# Patient Record
Sex: Female | Born: 1945 | Race: White | Hispanic: No | Marital: Married | Smoking: Never smoker
Health system: Southern US, Community
[De-identification: ages and names within clinical notes are randomized; demographics above are authoritative.]

## PROBLEM LIST (undated history)

## (undated) DIAGNOSIS — T8859XA Other complications of anesthesia, initial encounter: Secondary | ICD-10-CM

## (undated) DIAGNOSIS — R112 Nausea with vomiting, unspecified: Secondary | ICD-10-CM

## (undated) DIAGNOSIS — C801 Malignant (primary) neoplasm, unspecified: Secondary | ICD-10-CM

## (undated) DIAGNOSIS — J45909 Unspecified asthma, uncomplicated: Secondary | ICD-10-CM

## (undated) DIAGNOSIS — N809 Endometriosis, unspecified: Secondary | ICD-10-CM

## (undated) DIAGNOSIS — R519 Headache, unspecified: Secondary | ICD-10-CM

## (undated) DIAGNOSIS — K589 Irritable bowel syndrome without diarrhea: Secondary | ICD-10-CM

## (undated) DIAGNOSIS — M810 Age-related osteoporosis without current pathological fracture: Secondary | ICD-10-CM

## (undated) DIAGNOSIS — E785 Hyperlipidemia, unspecified: Secondary | ICD-10-CM

## (undated) DIAGNOSIS — T4145XA Adverse effect of unspecified anesthetic, initial encounter: Secondary | ICD-10-CM

## (undated) DIAGNOSIS — M1990 Unspecified osteoarthritis, unspecified site: Secondary | ICD-10-CM

## (undated) DIAGNOSIS — I10 Essential (primary) hypertension: Secondary | ICD-10-CM

## (undated) DIAGNOSIS — G4733 Obstructive sleep apnea (adult) (pediatric): Secondary | ICD-10-CM

## (undated) DIAGNOSIS — E538 Deficiency of other specified B group vitamins: Secondary | ICD-10-CM

## (undated) DIAGNOSIS — H269 Unspecified cataract: Secondary | ICD-10-CM

## (undated) DIAGNOSIS — R05 Cough: Secondary | ICD-10-CM

## (undated) DIAGNOSIS — R634 Abnormal weight loss: Secondary | ICD-10-CM

## (undated) DIAGNOSIS — G4730 Sleep apnea, unspecified: Secondary | ICD-10-CM

## (undated) DIAGNOSIS — F32A Depression, unspecified: Secondary | ICD-10-CM

## (undated) DIAGNOSIS — K317 Polyp of stomach and duodenum: Secondary | ICD-10-CM

## (undated) DIAGNOSIS — D649 Anemia, unspecified: Secondary | ICD-10-CM

## (undated) DIAGNOSIS — L659 Nonscarring hair loss, unspecified: Secondary | ICD-10-CM

## (undated) DIAGNOSIS — I4891 Unspecified atrial fibrillation: Secondary | ICD-10-CM

## (undated) DIAGNOSIS — M778 Other enthesopathies, not elsewhere classified: Secondary | ICD-10-CM

## (undated) DIAGNOSIS — I471 Supraventricular tachycardia, unspecified: Secondary | ICD-10-CM

## (undated) DIAGNOSIS — K449 Diaphragmatic hernia without obstruction or gangrene: Secondary | ICD-10-CM

## (undated) DIAGNOSIS — Z8701 Personal history of pneumonia (recurrent): Secondary | ICD-10-CM

## (undated) DIAGNOSIS — K219 Gastro-esophageal reflux disease without esophagitis: Secondary | ICD-10-CM

## (undated) DIAGNOSIS — R053 Chronic cough: Secondary | ICD-10-CM

## (undated) DIAGNOSIS — G4700 Insomnia, unspecified: Secondary | ICD-10-CM

## (undated) DIAGNOSIS — E039 Hypothyroidism, unspecified: Secondary | ICD-10-CM

## (undated) DIAGNOSIS — Z8719 Personal history of other diseases of the digestive system: Secondary | ICD-10-CM

## (undated) DIAGNOSIS — F419 Anxiety disorder, unspecified: Secondary | ICD-10-CM

## (undated) DIAGNOSIS — T7840XA Allergy, unspecified, initial encounter: Secondary | ICD-10-CM

## (undated) DIAGNOSIS — E559 Vitamin D deficiency, unspecified: Secondary | ICD-10-CM

## (undated) DIAGNOSIS — K529 Noninfective gastroenteritis and colitis, unspecified: Secondary | ICD-10-CM

## (undated) DIAGNOSIS — E7800 Pure hypercholesterolemia, unspecified: Secondary | ICD-10-CM

## (undated) DIAGNOSIS — R197 Diarrhea, unspecified: Secondary | ICD-10-CM

## (undated) DIAGNOSIS — Z9889 Other specified postprocedural states: Secondary | ICD-10-CM

## (undated) DIAGNOSIS — C439 Malignant melanoma of skin, unspecified: Secondary | ICD-10-CM

## (undated) HISTORY — DX: Unspecified cataract: H26.9

## (undated) HISTORY — PX: GALLBLADDER SURGERY: SHX652

## (undated) HISTORY — DX: Insomnia, unspecified: G47.00

## (undated) HISTORY — DX: Deficiency of other specified B group vitamins: E53.8

## (undated) HISTORY — DX: Other enthesopathies, not elsewhere classified: M77.8

## (undated) HISTORY — DX: Essential (primary) hypertension: I10

## (undated) HISTORY — DX: Age-related osteoporosis without current pathological fracture: M81.0

## (undated) HISTORY — DX: Vitamin D deficiency, unspecified: E55.9

## (undated) HISTORY — DX: Nonscarring hair loss, unspecified: L65.9

## (undated) HISTORY — PX: APPENDECTOMY: SHX54

## (undated) HISTORY — DX: Unspecified asthma, uncomplicated: J45.909

## (undated) HISTORY — DX: Hyperlipidemia, unspecified: E78.5

## (undated) HISTORY — DX: Unspecified osteoarthritis, unspecified site: M19.90

## (undated) HISTORY — DX: Depression, unspecified: F32.A

## (undated) HISTORY — DX: Endometriosis, unspecified: N80.9

## (undated) HISTORY — DX: Allergy, unspecified, initial encounter: T78.40XA

## (undated) HISTORY — DX: Anemia, unspecified: D64.9

## (undated) HISTORY — DX: Abnormal weight loss: R63.4

## (undated) HISTORY — PX: HERNIA REPAIR: SHX51

## (undated) HISTORY — DX: Pure hypercholesterolemia, unspecified: E78.00

## (undated) HISTORY — DX: Obstructive sleep apnea (adult) (pediatric): G47.33

## (undated) HISTORY — PX: CHOLECYSTECTOMY: SHX55

## (undated) HISTORY — DX: Malignant (primary) neoplasm, unspecified: C80.1

## (undated) HISTORY — DX: Sleep apnea, unspecified: G47.30

## (undated) HISTORY — DX: Polyp of stomach and duodenum: K31.7

## (undated) HISTORY — DX: Irritable bowel syndrome without diarrhea: K58.9

## (undated) HISTORY — DX: Chronic cough: R05.3

## (undated) HISTORY — PX: DILATION AND CURETTAGE, DIAGNOSTIC / THERAPEUTIC: SUR384

## (undated) HISTORY — PX: VESICOVAGINAL FISTULA CLOSURE W/ TAH: SUR271

## (undated) HISTORY — DX: Diaphragmatic hernia without obstruction or gangrene: K44.9

## (undated) HISTORY — PX: OOPHORECTOMY: SHX86

## (undated) HISTORY — DX: Malignant melanoma of skin, unspecified: C43.9

## (undated) HISTORY — DX: Supraventricular tachycardia, unspecified: I47.10

## (undated) HISTORY — DX: Noninfective gastroenteritis and colitis, unspecified: K52.9

## (undated) HISTORY — DX: Hypothyroidism, unspecified: E03.9

## (undated) HISTORY — DX: Cough: R05

## (undated) HISTORY — PX: EYE SURGERY: SHX253

## (undated) HISTORY — PX: KNEE ARTHROSCOPY: SHX127

## (undated) HISTORY — PX: ABDOMINAL HYSTERECTOMY: SHX81

## (undated) HISTORY — PX: TONSILLECTOMY: SUR1361

---

## 2009-01-01 DIAGNOSIS — C439 Malignant melanoma of skin, unspecified: Secondary | ICD-10-CM

## 2009-01-01 HISTORY — DX: Malignant melanoma of skin, unspecified: C43.9

## 2012-01-02 HISTORY — PX: HIATAL HERNIA REPAIR: SHX195

## 2012-02-02 HISTORY — PX: HIATAL HERNIA REPAIR: SHX195

## 2012-05-13 DIAGNOSIS — Z9889 Other specified postprocedural states: Secondary | ICD-10-CM | POA: Insufficient documentation

## 2012-08-04 ENCOUNTER — Ambulatory Visit (INDEPENDENT_AMBULATORY_CARE_PROVIDER_SITE_OTHER): Payer: Medicare Other | Admitting: Internal Medicine

## 2012-08-04 ENCOUNTER — Ambulatory Visit (INDEPENDENT_AMBULATORY_CARE_PROVIDER_SITE_OTHER)
Admission: RE | Admit: 2012-08-04 | Discharge: 2012-08-04 | Disposition: A | Discharge status: Home / Self Care | Payer: Medicare Other | Source: Ambulatory Visit | Attending: Internal Medicine | Admitting: Internal Medicine

## 2012-08-04 ENCOUNTER — Other Ambulatory Visit: Payer: Medicare Other

## 2012-08-04 ENCOUNTER — Encounter: Payer: Self-pay | Admitting: Internal Medicine

## 2012-08-04 VITALS — BP 132/80 | HR 60 | Temp 97.5°F | Ht 63.0 in | Wt 144.4 lb

## 2012-08-04 DIAGNOSIS — R05 Cough: Secondary | ICD-10-CM

## 2012-08-04 DIAGNOSIS — R059 Cough, unspecified: Secondary | ICD-10-CM

## 2012-08-04 DIAGNOSIS — R058 Other specified cough: Secondary | ICD-10-CM | POA: Insufficient documentation

## 2012-08-04 HISTORY — DX: Other specified cough: R05.8

## 2012-08-04 MED ORDER — PREDNISONE (PAK) 10 MG PO TABS: ORAL_TABLET | ORAL | Status: DC

## 2012-08-04 MED ORDER — TRAMADOL HCL 50 MG PO TABS: ORAL_TABLET | ORAL | Status: DC

## 2012-08-04 NOTE — Patient Instructions (Addendum)
The key to effective treatment for your cough is eliminating the non-stop cycle of cough you're stuck in long enough to let your airway heal completely and then see if there is anything still making you cough once you stop the cough suppression, but this should take no more than 5 days to figure out  First take delsym two tsp every 12 hours and supplement if needed with  tramadol 50 mg up to 2 every 4 hours to suppress the urge to cough at all or even clear your throat. Swallowing water or using ice chips/non mint and menthol containing candies (such as lifesavers or sugarless jolly ranchers) are also effective.  You should rest your voice and avoid activities that you know make you cough.  Once you have eliminated the cough for 3 straight days try reducing the tramadol first,  then the delsym as tolerated.    Pantoprazole (protonix) 40 mg   Take 30-60 min before first meal of the day and Pepcid 20 mg one bedtime until return to office - this is the best way to tell whether stomach acid is contributing to your problem.    GERD (REFLUX)  is an extremely common cause of respiratory symptoms, many times with no significant heartburn at all.    It can be treated with medication, but also with lifestyle changes including avoidance of late meals, excessive alcohol, smoking cessation, and avoid fatty foods, chocolate, peppermint, colas, red wine, and acidic juices such as orange juice.  NO MINT OR MENTHOL PRODUCTS SO NO COUGH DROPS  USE SUGARLESS CANDY INSTEAD (jolley ranchers or Stover's)  NO OIL BASED VITAMINS - use powdered substitutes.    Prednisone 10 mg take  4 each am x 2 days,   2 each am x 2 days,  1 each am x 2 days and stop    Please remember to go to the lab and x-ray department downstairs for your tests - we will call you with the results when they are available.     Please schedule a follow up office visit in 2 weeks, sooner if needed with all  medications in hand

## 2012-08-04 NOTE — Progress Notes (Signed)
  Subjective:    Patient ID: [name], female    DOB: May 15, 1945  MRN: [id number]  HPI  67 yowf never smoker with some ear infections assoc with passive smoke exp from father and subsequent problems with strep throats and tonsils removed age 67 since around 2008 more freq bronchitis referred 08/04/2012 to pulmonary clinic by Dr Brennan Bailey with persistent cough since 2012.   08/04/2012 1st pulmonary eval cc daily x 2 years  worse after 4pm gradually worse until bedtime then some better usually dry or min prod white mucus assoc with hoarseness much better on prednisone.  rx with qvar and singulair by [facility] > allergy to dust, roach imp was GERD > rec was for NF Feb 2014  Then it resolved   p   Surgery and recurred over the next sev months.  Cough occurs on breathing in and is quite violent.  L post cp x 2 years no different with cough  No obvious daytime variabilty or assoc sob cp or chest tightness, subjective wheeze overt sinus or hb symptoms at present. No unusual exp hx or h/o childhood pna/ asthma or knowledge of premature birth.   Sleeping ok without nocturnal  or early am exacerbation  of respiratory  c/o's or need for noct saba. Also denies any obvious fluctuation of symptoms with weather or environmental changes or other aggravating or alleviating factors except as outlined above   Review of Systems  Constitutional: Positive for unexpected weight change. Negative for fever and chills.  HENT: Positive for ear pain, sore throat and voice change. Negative for nosebleeds, congestion, rhinorrhea, sneezing, trouble swallowing, dental problem, postnasal drip and sinus pressure.   Eyes: Negative for visual disturbance.  Respiratory: Positive for cough. Negative for choking and shortness of breath.   Cardiovascular: Negative for chest pain and leg swelling.  Gastrointestinal: Positive for abdominal pain. Negative for vomiting and diarrhea.  Genitourinary: Negative for difficulty urinating.        Heart Burn Indigestion  Musculoskeletal: Negative for arthralgias.  Skin: Negative for rash.  Neurological: Negative for tremors, syncope and headaches.  Hematological: Does not bruise/bleed easily.       Objective:   Physical Exam   Very hoarse amb wf nad Wt Readings from Last 3 Encounters:  08/04/12 144 lb 6.4 oz (65.499 kg)    HEENT: nl dentition, turbinates, and orophanx. Nl external ear canals without cough reflex   NECK :  without JVD/Nodes/TM/ nl carotid upstrokes bilaterally   LUNGS: no acc muscle use, clear to A and P bilaterally without cough on insp or exp maneuvers   CV:  RRR  no s3 or murmur or increase in P2, no edema   ABD:  soft and nontender with nl excursion in the supine position. No bruits or organomegaly, bowel sounds nl  MS:  warm without deformities, calf tenderness, cyanosis or clubbing  SKIN: warm and dry without lesions    NEURO:  alert, approp, no deficits    CXR  08/04/2012 :   Pulmonary hyperinflation. No acute abnormality.       Assessment & Plan:

## 2012-08-04 NOTE — Assessment & Plan Note (Signed)
The most common causes of chronic cough in immunocompetent adults include the following: upper airway cough syndrome (UACS), previously referred to as postnasal drip syndrome (PNDS), which is caused by variety of rhinosinus conditions; (2) asthma; (3) GERD; (4) chronic bronchitis from cigarette smoking or other inhaled environmental irritants; (5) nonasthmatic eosinophilic bronchitis; and (6) bronchiectasis.   These conditions, singly or in combination, have accounted for up to 94% of the causes of chronic cough in prospective studies.   Other conditions have constituted no >6% of the causes in prospective studies These have included bronchogenic carcinoma, chronic interstitial pneumonia, sarcoidosis, left ventricular failure, ACEI-induced cough, and aspiration from a condition associated with pharyngeal dysfunction.    Chronic cough is often simultaneously caused by more than one condition. A single cause has been found from 38 to 82% of the time, multiple causes from 18 to 62%. Multiply caused cough has been the result of three diseases up to 42% of the time.       Most likely this is  Classic Upper airway cough syndrome, so named because it's frequently impossible to sort out how much is  CR/sinusitis with freq throat clearing (which can be related to primary GERD)   vs  causing  secondary (" extra esophageal")  GERD from wide swings in gastric pressure that occur with throat clearing, often  promoting self use of mint and menthol lozenges that reduce the lower esophageal sphincter tone and exacerbate the problem further in a cyclical fashion.   These are the same pts (now being labeled as having "irritable larynx syndrome" by some cough centers) who not infrequently have a history of having failed to tolerate ace inhibitors,  dry powder inhalers or biphosphonates or report having atypical reflux symptoms that don't respond to standard doses of PPI , and are easily confused as having aecopd or asthma  flares by even experienced allergists/ pulmonologists.  For now max rx directed at gerd and cyclical cough then regroup in 2 weeks  See instructions for specific recommendations which were reviewed directly with the patient who was given a copy with highlighter outlining the key components.

## 2012-08-05 ENCOUNTER — Telehealth: Payer: Self-pay | Admitting: Internal Medicine

## 2012-08-05 LAB — ALPHA-1-ANTITRYPSIN: A-1 Antitrypsin, Ser: 138 mg/dL (ref 90–200)

## 2012-08-05 MED ORDER — PANTOPRAZOLE SODIUM 40 MG PO TBEC: 40.0000 mg | DELAYED_RELEASE_TABLET | Freq: Every day | ORAL | Status: DC

## 2012-08-05 NOTE — Telephone Encounter (Signed)
Spoke with pt She states needing new rx for pantoprazole  Her rx has expired I have sent this to her pharm and nothing further needed per pt

## 2012-08-05 NOTE — Telephone Encounter (Signed)
The purpose of the instruction was to give her a range to control the cough with the max dose listed ("up to 2 every hours doesn't mean to start with 2 q4)  - it is likely she took more than she really needed and would advise start over with a half tablet every 4 hours and adjust up or down from there to see what dose she can tolerate than controls the cough and doesn't cause any cns side effects  If not able to tolerate even the lowest dose than stick with the delsym 2tsp q 12 h prn

## 2012-08-05 NOTE — Telephone Encounter (Signed)
Spoke with pt-- Pt expressed understanding with instructions and recs given by MW.  Pt states that she is wanting to hold the tramadol today since she is feeling so sick-to allow her body to get back to a baseline. Pt states that she is going to just take the Delsym until she feels better and see how her cough does. Pt asking what she can take for her hives, pt states that she has low dose benadryl and states that she is going to take that to help with her itching.  Pt to call if no improvement or if anything further needed.

## 2012-08-05 NOTE — Telephone Encounter (Signed)
Pt c/o increased HA with tramadol. Pt states that she advised to take up to 2 tablets q4hr prn at OV 08/04/12 w/MW.  Pt c/o nausea, hives, sweating and dizziness with this increase in medication, Pt states that her cough has subsided but she is having a bad reaction to the 2 tablets. Pt is worried to continue taking this medication further.   Please advise Dr Sherene Sires. Thanks.

## 2012-08-05 NOTE — Progress Notes (Signed)
Quick Note:  Spoke with pt and notified of results per Dr. Wert. Pt verbalized understanding and denied any questions.  ______ 

## 2012-08-05 NOTE — Telephone Encounter (Signed)
Ok to use benadryl if on hand or get chlortrimeton 4 mg every 4 hours as needed otc - agree if really has hives probably can't take tramadol in any dose - and will need to return to office by Friday pm if not completely back to baseline

## 2012-08-06 NOTE — Telephone Encounter (Signed)
I spoke with pt and made aware of recs. She voiced her understanding and needed nothing further

## 2012-08-08 ENCOUNTER — Encounter: Payer: Self-pay | Admitting: Internal Medicine

## 2012-08-08 LAB — ALPHA-1 ANTITRYPSIN PHENOTYPE: A-1 Antitrypsin: 144 mg/dL (ref 83–199)

## 2012-08-11 NOTE — Progress Notes (Signed)
Quick Note:  Spoke with pt and notified of results per Dr. Wert. Pt verbalized understanding and denied any questions.  ______ 

## 2012-08-18 ENCOUNTER — Ambulatory Visit (INDEPENDENT_AMBULATORY_CARE_PROVIDER_SITE_OTHER): Payer: Medicare Other | Admitting: Internal Medicine

## 2012-08-18 ENCOUNTER — Encounter: Payer: Self-pay | Admitting: Internal Medicine

## 2012-08-18 ENCOUNTER — Telehealth: Payer: Self-pay | Admitting: Internal Medicine

## 2012-08-18 VITALS — BP 132/70 | HR 75 | Temp 97.3°F | Ht 63.5 in | Wt 143.8 lb

## 2012-08-18 DIAGNOSIS — R059 Cough, unspecified: Secondary | ICD-10-CM

## 2012-08-18 DIAGNOSIS — R05 Cough: Secondary | ICD-10-CM

## 2012-08-18 MED ORDER — PREDNISONE 10 MG PO TABS: 10.0000 mg | ORAL_TABLET | Freq: Every day | ORAL | Status: DC

## 2012-08-18 MED ORDER — GABAPENTIN 100 MG PO CAPS: 100.0000 mg | ORAL_CAPSULE | Freq: Three times a day (TID) | ORAL | Status: DC

## 2012-08-18 NOTE — Patient Instructions (Addendum)
The key to effective treatment for your cough is eliminating the non-stop cycle of cough you're stuck in long enough to let your airway heal completely and then see if there is anything still making you cough once you stop the cough suppression, but this should take no more than 5 days to figure out  First take delsym two tsp every 12 hours     Pantoprazole (protonix) 40 mg   Take 30-60 min before first meal of the day and Pepcid 20 mg one bedtime and chlortrimeton 4 mg one at bedtime.   Neurontin 100 mg three times a day with meals    GERD (REFLUX)  is an extremely common cause of respiratory symptoms, many times with no significant heartburn at all.    It can be treated with medication, but also with lifestyle changes including avoidance of late meals, excessive alcohol, smoking cessation, and avoid fatty foods, chocolate, peppermint, colas, red wine, and acidic juices such as orange juice.  NO MINT OR MENTHOL PRODUCTS SO NO COUGH DROPS  USE SUGARLESS CANDY INSTEAD (jolley ranchers or Stover's or [employer])  NO OIL BASED VITAMINS - use powdered substitutes.  Please schedule a follow up office visit in 2 weeks, sooner if needed with medications and your print out from your pharmacy from February 2014

## 2012-08-18 NOTE — Progress Notes (Signed)
Subjective:    Patient ID: [name], female    DOB: 1945/03/04  MRN: [id number]    Brief patient profile:  67 yowf never smoker with some ear infections assoc with passive smoke exp from father and subsequent problems with strep throats and tonsils removed age 67 since around 2008 more freq bronchitis referred 08/04/2012 to pulmonary clinic by Dr Brennan Bailey with persistent cough since 2012.  HPI 08/04/2012 1st pulmonary eval cc daily x 2 years  worse after 4pm gradually worse until bedtime then some better usually dry or min prod white mucus assoc with hoarseness much better on prednisone.  rx with qvar and singulair by [facility] > allergy to dust, roach imp was GERD > rec was for NF Feb 2014  Then it resolved p Surgery and recurred over the next sev months. Cough occurs on breathing in and is quite violent.  L post cp x 2 years no different with cough.   CT chest and abd at [facility] > "nl" rec Cyclical cough regimen  08/18/2012 f/u ov/[facility] Rosas re cough Chief Complaint  Patient presents with  . Follow-up    pt reports cough remains, states it is a little better than before but still present, was unable to tolerate tramadol-- c/o pain under left rib that extends from back to front when lying down    Cough better for only first 2 days while on tramadol but htne developed intolerance and cough reverted to baseline.. Remains dry, daytime.  No obvious daytime variabilty or assoc sob cp or chest tightness, subjective wheeze overt sinus or hb symptoms at present. No unusual exp hx or h/o childhood pna/ asthma or knowledge of premature birth.   Sleeping ok without nocturnal  or early am exacerbation  of respiratory  c/o's or need for noct saba. Also denies any obvious fluctuation of symptoms with weather or environmental changes or other aggravating or alleviating factors except as outlined above   Current Medications, Allergies, Past Medical History, Past Surgical History, Family History, and Social  History were reviewed in [employer] record.  ROS  The following are not active complaints unless bolded sore throat, dysphagia, dental problems, itching, sneezing,  nasal congestion or excess/ purulent secretions, ear ache,   fever, chills, sweats, unintended wt loss, pleuritic or exertional cp, hemoptysis,  orthopnea pnd or leg swelling, presyncope, palpitations, heartburn, abdominal pain, anorexia, nausea, vomiting, diarrhea  or change in bowel or urinary habits, change in stools or urine, dysuria,hematuria,  rash, arthralgias, visual complaints, headache, numbness weakness or ataxia or problems with walking or coordination,  change in mood/affect or memory.            Objective:   Physical Exam   Very hoarse amb wf nad Wt Readings from Last 3 Encounters:  08/18/12 143 lb 12.8 oz (65.227 kg)  08/04/12 144 lb 6.4 oz (65.499 kg)       HEENT: nl dentition, turbinates, and orophanx. Nl external ear canals without cough reflex   NECK :  without JVD/Nodes/TM/ nl carotid upstrokes bilaterally   LUNGS: no acc muscle use, clear to A and P bilaterally without cough on insp or exp maneuvers   CV:  RRR  no s3 or murmur or increase in P2, no edema   ABD:  soft and nontender with nl excursion in the supine position. No bruits or organomegaly, bowel sounds nl  MS:  warm without deformities, calf tenderness, cyanosis or clubbing  SKIN: warm and dry without lesions  NEURO:  alert, approp, no deficits    CXR  08/04/2012 :   Pulmonary hyperinflation. No acute abnormality.       Assessment & Plan:

## 2012-08-18 NOTE — Telephone Encounter (Signed)
Spoke to pt. She wasn't sure why MW gave her Gabapentin. Advised her it was to help with her cough. Nothing further was needed.

## 2012-08-19 ENCOUNTER — Telehealth: Payer: Self-pay | Admitting: Internal Medicine

## 2012-08-19 NOTE — Telephone Encounter (Signed)
Dr. Sherene Sires please advise directions for prednisone. It states 10 mg QD #20. Is this suppose to be a taper? thanks

## 2012-08-19 NOTE — Telephone Encounter (Signed)
Pt is aware as to how to take her prednisone. Nothing further was needed.

## 2012-08-19 NOTE — Telephone Encounter (Signed)
Take 4 for two days three for two days two for two days one for two days

## 2012-08-20 NOTE — Assessment & Plan Note (Signed)
-   [facility] AT >  MM  Still strongly suspect  Classic Upper airway cough syndrome, so named because it's frequently impossible to sort out how much is  CR/sinusitis with freq throat clearing (which can be related to primary GERD)   vs  causing  secondary (" extra esophageal")  GERD from wide swings in gastric pressure that occur with throat clearing, often  promoting self use of mint and menthol lozenges that reduce the lower esophageal sphincter tone and exacerbate the problem further in a cyclical fashion.   These are the same pts (now being labeled as having "irritable larynx syndrome" by some cough centers) who not infrequently have a history of having failed to tolerate ace inhibitors,  dry powder inhalers or biphosphonates or report having atypical reflux symptoms that don't respond to standard doses of PPI , and are easily confused as having aecopd or asthma flares by even experienced allergists/ pulmonologists.   Since can't tol tramadol no choice at this point having failed mutiple other interventions but add neurontin on a trial basis because if she doesn't stop coughing she won't stop coughing.

## 2012-09-02 ENCOUNTER — Ambulatory Visit (INDEPENDENT_AMBULATORY_CARE_PROVIDER_SITE_OTHER): Payer: Medicare Other | Admitting: Internal Medicine

## 2012-09-02 ENCOUNTER — Encounter: Payer: Self-pay | Admitting: Internal Medicine

## 2012-09-02 VITALS — BP 130/70 | HR 66 | Temp 97.7°F | Ht 63.5 in | Wt 144.0 lb

## 2012-09-02 DIAGNOSIS — R059 Cough, unspecified: Secondary | ICD-10-CM

## 2012-09-02 DIAGNOSIS — R05 Cough: Secondary | ICD-10-CM

## 2012-09-02 MED ORDER — METHYLPREDNISOLONE ACETATE 80 MG/ML IJ SUSP
120.0000 mg | Freq: Once | INTRAMUSCULAR | Status: AC
  Administered 2012-09-02: 120 mg via INTRAMUSCULAR

## 2012-09-02 MED ORDER — GABAPENTIN 300 MG PO CAPS: 300.0000 mg | ORAL_CAPSULE | Freq: Three times a day (TID) | ORAL | Status: DC

## 2012-09-02 MED ORDER — MEPERIDINE HCL 50 MG PO TABS: ORAL_TABLET | ORAL | Status: DC

## 2012-09-02 NOTE — Assessment & Plan Note (Addendum)
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   These are the same pts (now being labeled as having "irritable larynx syndrome" by some cough centers) who not infrequently have a history of having failed to tolerate ace inhibitors,  dry powder inhalers or biphosphonates or report having atypical reflux symptoms that don't respond to standard doses of PPI , and are easily confused as having aecopd or asthma flares by even experienced allergists/ pulmonologists.  The problem is she's not even aware of the throat clearing to it's hard to get her buy in to what I mean by 100% cough suppression and can't take even short term tramadol or any of the codeine derivative so will try very short term demerol and add neurontin 300 mg tid.     Each maintenance medication was reviewed in detail including most importantly the difference between maintenance and as needed and under what circumstances the prns are to be used.  Please see instructions for details which were reviewed in writing and the patient given a copy.

## 2012-09-02 NOTE — Patient Instructions (Addendum)
Plan A= automatic = Pantoprazole 40 mg Take 30-60 min before first meal of the day and famotidine 20 mg and chlortrimeton 4 mg at 4 pm and at bedtime While neurontin to 300 mg three times a day  Plan B= Backup = delsym 2 tsp every 12 hours  Plan C = Demerol 50 mg up to one every 4 hours x 3 days max and no driving   Please schedule a follow up office visit in 2 weeks, sooner if needed

## 2012-09-02 NOTE — Progress Notes (Signed)
Subjective:    Patient ID: [name], female    DOB: 1945-11-29  MRN: [id number]    Brief patient profile:  30 yowf never smoker with some ear infections assoc with passive smoke exp from father and subsequent problems with strep throats and tonsils removed age 67 since around 2008 more freq bronchitis referred 08/04/2012 to pulmonary clinic by Dr Brennan Bailey with persistent cough since 2012.  HPI 08/04/2012 1st pulmonary eval cc daily x 2 years  worse after 4pm gradually worse until bedtime then some better usually dry or min prod white mucus assoc with hoarseness much better on prednisone.  rx with qvar and singulair by [facility] > allergy to dust, roach imp was GERD > rec was for NF Feb 2014  Then it resolved p Surgery and recurred over the next sev months. Cough occurs on breathing in and is quite violent.  L post cp x 2 years no different with cough.   CT chest and abd [facility] > "nl" rec Cyclical cough regimen  08/18/2012 f/u ov/[name] re cough Chief Complaint  Patient presents with  . Follow-up    pt reports cough remains, states it is a little better than before but still present, was unable to tolerate tramadol-- c/o pain under left rib that extends from back to front when lying down    Cough better for only first 2 days while on tramadol but then developed intolerance and cough reverted to baseline... Remains dry, daytime rec neurontin 100 tid   09/02/2012 f/u ov/[name] re cough Chief Complaint  Patient presents with  . Cough    Cough is slightly improved. Still present but not as bad.     No obvious daytime variabilty or assoc sob cp or chest tightness, subjective wheeze overt sinus or hb symptoms at present. No unusual exp hx or h/o childhood pna/ asthma or knowledge of premature birth.   Sleeping ok without nocturnal  or early am exacerbation  of respiratory  c/o's or need for noct saba. Also denies any obvious fluctuation of symptoms with weather or environmental changes or  other aggravating or alleviating factors except as outlined above   Current Medications, Allergies, Past Medical History, Past Surgical History, Family History, and Social History were reviewed in [employer] record.  ROS  The following are not active complaints unless bolded sore throat, dysphagia, dental problems, itching, sneezing,  nasal congestion or excess/ purulent secretions, ear ache,   fever, chills, sweats, unintended wt loss, pleuritic or exertional cp, hemoptysis,  orthopnea pnd or leg swelling, presyncope, palpitations, heartburn, abdominal pain, anorexia, nausea, vomiting, diarrhea  or change in bowel or urinary habits, change in stools or urine, dysuria,hematuria,  rash, arthralgias, visual complaints, headache, numbness weakness or ataxia or problems with walking or coordination,  change in mood/affect or memory.            Objective:   Physical Exam   Min  hoarse amb wf nad with occ dry throat clearing not appreciated by pt or husband  09/02/2012        144  Wt Readings from Last 3 Encounters:  08/18/12 143 lb 12.8 oz (65.227 kg)  08/04/12 144 lb 6.4 oz (65.499 kg)       HEENT: nl dentition, turbinates, and orophanx. Nl external ear canals without cough reflex   NECK :  without JVD/Nodes/TM/ nl carotid upstrokes bilaterally   LUNGS: no acc muscle use, clear to A and P bilaterally without cough on insp or exp  maneuvers   CV:  RRR  no s3 or murmur or increase in P2, no edema   ABD:  soft and nontender with nl excursion in the supine position. No bruits or organomegaly, bowel sounds nl  MS:  warm without deformities, calf tenderness, cyanosis or clubbing  SKIN: warm and dry without lesions    NEURO:  alert, approp, no deficits    CXR  08/04/2012 :   Pulmonary hyperinflation. No acute abnormality.       Assessment & Plan:

## 2012-09-03 ENCOUNTER — Other Ambulatory Visit: Payer: Self-pay | Admitting: Internal Medicine

## 2012-09-05 ENCOUNTER — Encounter: Payer: Self-pay | Admitting: Internal Medicine

## 2012-09-10 ENCOUNTER — Telehealth: Payer: Self-pay | Admitting: Internal Medicine

## 2012-09-10 DIAGNOSIS — R05 Cough: Secondary | ICD-10-CM

## 2012-09-10 DIAGNOSIS — R059 Cough, unspecified: Secondary | ICD-10-CM

## 2012-09-10 NOTE — Telephone Encounter (Signed)
I called [facility] and gave them the information stated by MW. This is under clinical review and will let us know a decision. Will forward to leslie to look out for approval or denial

## 2012-09-10 NOTE — Telephone Encounter (Signed)
She is intolerant to every other category of narcotic and the non-narcotic cough meds won't control the cough

## 2012-09-10 NOTE — Telephone Encounter (Signed)
Spoke with the pt She states that the meperidine is not covered by her ins, needs PA  [facility] for PA [id number] and initiated PA  Dr Sherene Sires, in the event that they do not approve med, is there something else to want her to try? Please advise, thanks Allergies  Allergen Reactions  . Codeine Nausea And Vomiting  . Sulfa Antibiotics Rash

## 2012-09-12 ENCOUNTER — Telehealth: Payer: Self-pay | Admitting: Internal Medicine

## 2012-09-12 NOTE — Telephone Encounter (Signed)
ATC the pt, line busy x 3

## 2012-09-12 NOTE — Telephone Encounter (Signed)
Medication was approved Called [facility] (pharm on file) to let them know, but they advised that she did not take rx there to have it filled I called and LMTCB

## 2012-09-12 NOTE — Telephone Encounter (Signed)
I spoke with pt. She is aware. She stated she used [facility]. I called and made them aware. Nothing further

## 2012-09-15 NOTE — Telephone Encounter (Signed)
ATC patient no answer [id number] 

## 2012-09-15 NOTE — Telephone Encounter (Signed)
I spoke with pt. She stated the pharmacy is telling her they are still getting a rejection notice from her insurance on her meperidone 50 mg. It is stating the drug and DX mismatch submitted. I called [phone] to see what is going on.  Pt member ID# [id number] I spoke with [place]. She stated they are working on getting this straight and have the pharmacy give them 2-3 hrs before they try submitting this again. It should be fixed by then.  I called [facility] and spoke with Kathlene November. He is aware and he will try again later. I called pt to make her aware and she needed nothing further

## 2012-09-16 ENCOUNTER — Encounter: Payer: Self-pay | Admitting: Internal Medicine

## 2012-09-16 ENCOUNTER — Ambulatory Visit (INDEPENDENT_AMBULATORY_CARE_PROVIDER_SITE_OTHER): Payer: Medicare Other | Admitting: Internal Medicine

## 2012-09-16 VITALS — BP 122/70 | HR 74 | Temp 96.7°F | Ht 63.5 in | Wt 144.0 lb

## 2012-09-16 DIAGNOSIS — R059 Cough, unspecified: Secondary | ICD-10-CM

## 2012-09-16 DIAGNOSIS — R05 Cough: Secondary | ICD-10-CM

## 2012-09-16 NOTE — Progress Notes (Signed)
Subjective:    Patient ID: [name], female    DOB: 1945/03/06  MRN: [id number]    Brief patient profile:  3 yowf never smoker with some ear infections assoc with passive smoke exp from father and subsequent problems with strep throats and tonsils removed age 67 since around 2008 more freq bronchitis referred 08/04/2012 to pulmonary clinic by Dr Brennan Bailey with persistent cough since 2012.  HPI 08/04/2012 1st pulmonary eval cc daily x 2 years  worse after 4pm gradually worse until bedtime then some better usually dry or min prod white mucus assoc with hoarseness much better on prednisone.  rx with qvar and singulair by Kozlow > allergy to dust, roach imp was GERD > rec was for NF Feb 2014  Then it resolved p Surgery and recurred over the next sev months. Cough occurs on breathing in and is quite violent.  L post cp x 2 years no different with cough.   CT chest and abd at [facility] > "nl" rec Cyclical cough regimen  08/18/2012 f/u ov/Corydon Schweiss re cough Chief Complaint  Patient presents with  . Follow-up    pt reports cough remains, states it is a little better than before but still present, was unable to tolerate tramadol-- c/o pain under left rib that extends from back to front when lying down    Cough better for only first 2 days while on tramadol but then developed intolerance and cough reverted to baseline... Remains dry, daytime rec neurontin 100 tid   09/02/2012 f/u ov/Doreene Forrey re cough Chief Complaint  Patient presents with  . Cough    Cough is slightly improved. Still present but not as bad.  rec Plan A= automatic = Pantoprazole 40 mg Take 30-60 min before first meal of the day and famotidine 20 mg and chlortrimeton 4 mg at 4 pm and at bedtime While neurontin to 300 mg three times a day  Plan B= Backup = delsym 2 tsp every 12 hours  Plan C = Demerol 50 mg up to one every 4 hours x 3 days max and no driving   Please schedule a follow up office visit in 2 weeks, sooner if needed     09/16/2012 f/u ov/Deriana Vanderhoef re chronic cough Chief Complaint  Patient presents with  . Follow-up    Pt reports cough returned x 1 weeks ago, also c/o sore throat and tongue pain from sucking on candy-- will start demerol  today -- denies any other concerns at this time    Never took delsym or demerol to eliminate the throat clearing which evolves to dry cough later in day and evening.    No obvious daytime variabilty or assoc sob cp or chest tightness, subjective wheeze overt sinus or hb symptoms at present. No unusual exp hx or h/o childhood pna/ asthma or knowledge of premature birth.   Sleeping ok without nocturnal  or early am exacerbation  of respiratory  c/o's or need for noct saba. Also denies any obvious fluctuation of symptoms with weather or environmental changes or other aggravating or alleviating factors except as outlined above   Current Medications, Allergies, Past Medical History, Past Surgical History, Family History, and Social History were reviewed in [employer] record.  ROS  The following are not active complaints unless bolded sore throat, dysphagia, dental problems, itching, sneezing,  nasal congestion or excess/ purulent secretions, ear ache,   fever, chills, sweats, unintended wt loss, pleuritic or exertional cp, hemoptysis,  orthopnea pnd or leg swelling,  presyncope, palpitations, heartburn, abdominal pain, anorexia, nausea, vomiting, diarrhea  or change in bowel or urinary habits, change in stools or urine, dysuria,hematuria,  rash, arthralgias, visual complaints, headache, numbness weakness or ataxia or problems with walking or coordination,  change in mood/affect or memory.            Objective:   Physical Exam   Min  hoarse amb wf nad with occ dry throat clearing    09/02/2012        144  Wt Readings from Last 3 Encounters:  08/18/12 143 lb 12.8 oz (65.227 kg)  08/04/12 144 lb 6.4 oz (65.499 kg)       HEENT: nl dentition,  turbinates, and orophanx. Nl external ear canals without cough reflex   NECK :  without JVD/Nodes/TM/ nl carotid upstrokes bilaterally   LUNGS: no acc muscle use, clear to A and P bilaterally without cough on insp or exp maneuvers   CV:  RRR  no s3 or murmur or increase in P2, no edema   ABD:  soft and nontender with nl excursion in the supine position. No bruits or organomegaly, bowel sounds nl  MS:  warm without deformities, calf tenderness, cyanosis or clubbing  SKIN: warm and dry without lesions    NEURO:  alert, approp, no deficits    CXR  08/04/2012 :   Pulmonary hyperinflation. No acute abnormality.       Assessment & Plan:

## 2012-09-16 NOTE — Patient Instructions (Addendum)
Prednisone 10 mg take  4 each am x 2 days,   2 each am x 2 days,  1 each am x 2 days and stop   Take delsym two tsp every 12 hours and supplement if needed with demerol  50 mg up to 2 every 4 hours to completely suppress the urge to cough. Swallowing water or using ice chips/non mint and menthol containing candies (such as lifesavers or sugarless jolly ranchers) are also effective.  You should rest your voice and avoid activities that you know make you cough.  Once you have eliminated the cough for 3 straight days try reducing the demerol  first,  then the delsym as tolerated.     Stay on neurontin 300mg  three times daily   Chlortrimeton 4 mg at 4 pm and at bedtime   Continue pantoprazole and pepcid  See Tammy NP w/in 2 weeks with all your medications, even over the counter meds, separated in two separate bags, the ones you take no matter what vs the ones you stop once you feel better and take only as needed when you feel you need them.   Tammy  will generate for you a new user friendly medication calendar that will put [place] all on the same page re: your medication use.     Without this process, it simply isn't possible to assure that we are providing  your outpatient care  with  the attention to detail we feel you deserve.   If we cannot assure that you're getting that kind of care,  then we cannot manage your problem effectively from this clinic.  Once you have seen Tammy and we are sure that we're all on the same page with your medication use she will arrange follow up with me.

## 2012-09-17 ENCOUNTER — Telehealth: Payer: Self-pay | Admitting: Internal Medicine

## 2012-09-17 MED ORDER — PREDNISONE 10 MG PO TABS: ORAL_TABLET | ORAL | Status: DC

## 2012-09-17 NOTE — Telephone Encounter (Signed)
Spoke with patient, patient seen by Dr. Sherene Sires yesterday Patient was to have prednisone Rx sent to pharmacy however this was not sent Rx has now been sent to verified pharmacy and nothing further needed at this time

## 2012-09-17 NOTE — Assessment & Plan Note (Addendum)
-   PFT's 02/2010 nl in effort dep portion of f/v loop, lung vol and dlcos also nl  - CT chest 05/27/12 wnl x small HH - 08/04/12   [facility] AT >  MM - informed 09/10/2012 had not received demerol rx on 09/02/12   I had an extended discussion with the patient today lasting 15 to 20 minutes of a 25 minute visit on the following issues:   She did not understand the cyclical nature of her refractory cough and at this point I'm not confident she's taking any of her meds exactly as intended.   To keep things simple, I have asked the patient to first separate medicines that are perceived as maintenance, that is to be taken daily "no matter what", from those medicines that are taken on only on an as-needed basis and I have given the patient examples of both, and then return to see our NP to generate a  detailed  medication calendar which should be followed until the next physician sees the patient and updates it.    See instructions for specific recommendations which were reviewed directly with the patient who was given a copy with highlighter outlining the key components.

## 2012-10-01 ENCOUNTER — Encounter: Payer: Medicare Other | Admitting: Adult Health

## 2012-10-02 ENCOUNTER — Ambulatory Visit (INDEPENDENT_AMBULATORY_CARE_PROVIDER_SITE_OTHER): Payer: Medicare Other | Admitting: Adult Health

## 2012-10-02 ENCOUNTER — Encounter: Payer: Self-pay | Admitting: Adult Health

## 2012-10-02 VITALS — BP 130/62 | HR 75 | Ht 63.6 in | Wt 142.6 lb

## 2012-10-02 DIAGNOSIS — R059 Cough, unspecified: Secondary | ICD-10-CM

## 2012-10-02 DIAGNOSIS — R05 Cough: Secondary | ICD-10-CM

## 2012-10-02 NOTE — Addendum Note (Signed)
Addended by: Boone Master E on: 10/02/2012 05:32 PM   Modules accepted: Orders

## 2012-10-02 NOTE — Progress Notes (Signed)
Subjective:    Patient ID: [name], female    DOB: [birth year]  MRN: [id number]    Brief patient profile:  15 yowf never smoker with some ear infections assoc with passive smoke exp from father and subsequent problems with strep throats and tonsils removed age 67 since around 2008 more freq bronchitis referred 08/04/2012 to pulmonary clinic by Dr Brennan Bailey with persistent cough since 2012.  HPI 08/04/2012 1st pulmonary eval cc daily x 2 years  worse after 4pm gradually worse until bedtime then some better usually dry or min prod white mucus assoc with hoarseness much better on prednisone.  rx with qvar and singulair by [facility]low > allergy to dust, roach imp was GERD > rec was for NF Feb 2014  Then it resolved p Surgery and recurred over the next sev months. Cough occurs on breathing in and is quite violent.  L post cp x 2 years no different with cough.   CT chest and abd at [facility] > "nl" rec Cyclical cough regimen  08/18/2012 f/u ov/Wert re cough Chief Complaint  Patient presents with  . Follow-up    pt reports cough remains, states it is a little better than before but still present, was unable to tolerate tramadol-- c/o pain under left rib that extends from back to front when lying down    Cough better for only first 2 days while on tramadol but then developed intolerance and cough reverted to baseline... Remains dry, daytime rec neurontin 100 tid   09/02/2012 f/u ov/Wert re cough Chief Complaint  Patient presents with  . Cough    Cough is slightly improved. Still present but not as bad.  rec Plan A= automatic = Pantoprazole 40 mg Take 30-60 min before first meal of the day and famotidine 20 mg and chlortrimeton 4 mg at 4 pm and at bedtime While neurontin to 300 mg three times a day  Plan B= Backup = delsym 2 tsp every 12 hours  Plan C = Demerol 50 mg up to one every 4 hours x 3 days max and no driving   Please schedule a follow up office visit in 2 weeks, sooner if needed     09/16/2012 f/u ov/Wert re chronic cough Chief Complaint  Patient presents with  . Follow-up    Pt reports cough returned x 1 weeks ago, also c/o sore throat and tongue pain from sucking on candy-- will start demerol  today -- denies any other concerns at this time   >pred pack and cyclical cough regimen  10/02/2012 Follow up for cough and Med review   new med calendar - pt brought all meds with her today.  reports cough is "much better", with basically no cough the past 2 days We reviewed all her medications and organized them into a medication calendar with patient education Appears patient is taking her medications correctly. Patient feels that her cough is much improved. She has not coughed at all for 2 days. Patient does complain that Neurontin seems to be making her somewhat off balance at times. Patient denies any hemoptysis, orthopnea, PND, leg swelling    Current Medications, Allergies, Past Medical History, Past Surgical History, Family History, and Social History were reviewed in [employer] record.  ROS  The following are not active complaints unless bolded sore throat, dysphagia, dental problems, itching, sneezing,  nasal congestion or excess/ purulent secretions, ear ache,   fever, chills, sweats, unintended wt loss, pleuritic or exertional cp, hemoptysis,  orthopnea  pnd or leg swelling, presyncope, palpitations, heartburn, abdominal pain, anorexia, nausea, vomiting, diarrhea  or change in bowel or urinary habits, change in stools or urine, dysuria,hematuria,  rash, arthralgias, visual complaints, headache, numbness weakness or ataxia or problems with walking or coordination,  change in mood/affect or memory.            Objective:   Physical Exam   Min  hoarse amb wf nad  09/02/2012        144 >142 10/02/2012   HEENT: nl dentition, turbinates, and orophanx. Nl external ear canals without cough reflex   NECK :  without JVD/Nodes/TM/ nl carotid  upstrokes bilaterally   LUNGS: no acc muscle use, clear to A and P bilaterally without cough on insp or exp maneuvers   CV:  RRR  no s3 or murmur or increase in P2, no edema   ABD:  soft and nontender with nl excursion in the supine position. No bruits or organomegaly, bowel sounds nl  MS:  warm without deformities, calf tenderness, cyanosis or clubbing  SKIN: warm and dry without lesions    NEURO:  alert, approp, no deficits    CXR  08/04/2012 :   Pulmonary hyperinflation. No acute abnormality.       Assessment & Plan:

## 2012-10-02 NOTE — Assessment & Plan Note (Addendum)
Cyclical cough, much improved on cough suppression regimen, along with reflux, and rhinitis trigger prevention Patient's medications were reviewed today and patient education was given. Computerized medication calendar was adjusted/completed   Plan  Decrease Neurontin 300mg  Twice daily   Follow med calendar closely and bring to each visit.  follow up Dr. Sherene Sires  In 3-4 weeks and As needed

## 2012-10-02 NOTE — Patient Instructions (Addendum)
Decrease Neurontin 300mg  Twice daily   Follow med calendar closely and bring to each visit.  follow up Dr. Sherene Sires  In 3-4 weeks and As needed

## 2012-10-23 ENCOUNTER — Ambulatory Visit (INDEPENDENT_AMBULATORY_CARE_PROVIDER_SITE_OTHER): Payer: Medicare Other | Admitting: Internal Medicine

## 2012-10-23 ENCOUNTER — Telehealth: Payer: Self-pay | Admitting: Internal Medicine

## 2012-10-23 ENCOUNTER — Ambulatory Visit (INDEPENDENT_AMBULATORY_CARE_PROVIDER_SITE_OTHER)
Admission: RE | Admit: 2012-10-23 | Discharge: 2012-10-23 | Disposition: A | Discharge status: Home / Self Care | Payer: Medicare Other | Source: Ambulatory Visit | Attending: Internal Medicine | Admitting: Internal Medicine

## 2012-10-23 ENCOUNTER — Encounter: Payer: Self-pay | Admitting: Internal Medicine

## 2012-10-23 VITALS — BP 140/76 | HR 60 | Temp 97.8°F | Ht 63.6 in | Wt 146.4 lb

## 2012-10-23 DIAGNOSIS — R059 Cough, unspecified: Secondary | ICD-10-CM

## 2012-10-23 DIAGNOSIS — R05 Cough: Secondary | ICD-10-CM

## 2012-10-23 MED ORDER — GABAPENTIN 100 MG PO CAPS: 100.0000 mg | ORAL_CAPSULE | Freq: Four times a day (QID) | ORAL | Status: DC

## 2012-10-23 NOTE — Patient Instructions (Signed)
Please see patient coordinator before you leave today  to schedule sinus ct  Change neurontin 100 mg to 4 x daily as per calendar   See calendar for specific medication instructions and bring it back for each and every office visit for every healthcare provider you see.  Without it,  you may not receive the best quality medical care that we feel you deserve.  You will note that the calendar groups together  your maintenance  medications that are timed at particular times of the day.  Think of this as your checklist for what your doctor has instructed you to do until your next evaluation to see what benefit  there is  to staying on a consistent group of medications intended to keep you well.  The other group at the bottom is entirely up to you to use as you see fit  for specific symptoms that may arise between visits that require you to treat them on an as needed basis.  Think of this as your action plan or "what if" list.   Separating the top medications from the bottom group is fundamental to providing you adequate care going forward.   Please schedule a follow up office visit in 6 weeks, call sooner if needed

## 2012-10-23 NOTE — Progress Notes (Signed)
Quick Note:  Advised pt of CT results per MW. Pt verbalized understanding and has no further questions ______ 

## 2012-10-23 NOTE — Progress Notes (Signed)
Subjective:    Patient ID: [name], female    DOB: 09-24-1945  MRN: [id number]    Brief patient profile:  73 yowf never smoker with some ear infections assoc with passive smoke exp from father and subsequent problems with strep throats and tonsils removed age 67 since around 2008 more freq bronchitis referred 08/04/2012 to pulmonary clinic by Dr Brennan Bailey with persistent cough since 2012.  HPI 08/04/2012 1st pulmonary eval cc daily x 2 years  worse after 4pm gradually worse until bedtime then some better usually dry or min prod white mucus assoc with hoarseness much better on prednisone.  rx with qvar and singulair by Kozlow > allergy to dust, roach imp was GERD > rec was for NF Feb 2014  Then it resolved p Surgery and recurred over the next sev months. Cough occurs on breathing in and is quite violent.  L post cp x 2 years no different with cough.   CT chest and abd at [facility] > "nl" rec Cyclical cough regimen  08/18/2012 f/u ov/Brenton Joines re cough Chief Complaint  Patient presents with  . Follow-up    pt reports cough remains, states it is a little better than before but still present, was unable to tolerate tramadol-- c/o pain under left rib that extends from back to front when lying down    Cough better for only first 2 days while on tramadol but then developed intolerance and cough reverted to baseline... Remains dry, daytime rec neurontin 100 tid   09/02/2012 f/u ov/Rodriguez Aguinaldo re cough Chief Complaint  Patient presents with  . Cough    Cough is slightly improved. Still present but not as bad.  rec Plan A= automatic = Pantoprazole 40 mg Take 30-60 min before first meal of the day and famotidine 20 mg and chlortrimeton 4 mg at 4 pm and at bedtime While neurontin to 300 mg three times a day Plan B= Backup = delsym 2 tsp every 12 hours Plan C = Demerol 50 mg up to one every 4 hours x 3 days max and no driving  Please schedule a follow up office visit in 2 weeks, sooner if needed     09/16/2012 f/u ov/Carole Doner re chronic cough Chief Complaint  Patient presents with  . Follow-up    Pt reports cough returned x 1 weeks ago, also c/o sore throat and tongue pain from sucking on candy-- will start demerol  today -- denies any other concerns at this time   >pred pack and cyclical cough regimen  10/02/2012 Follow up for cough and Med review   new med calendar - pt brought all meds with her today.  reports cough is "much better", with basically no cough the past 2 days We reviewed all her medications and organized them into a medication calendar with patient education Appears patient is taking her medications correctly. Patient feels that her cough is much improved. She has not coughed at all for 2 days. Patient does complain that Neurontin seems to be making her somewhat off balance at times.  rec Decrease Neurontin 300mg  Twice daily   Follow med calendar closely and bring to each visit.  10/23/2012 f/u ov/Quintel Mccalla re: chronic cough x 2 year- did not bring med calendar  Chief Complaint  Patient presents with  . Follow-up    Pt states that her cough has improved- she did have double ear infection and URI and was txed with zithromax approx 1 wk ago.   not using demerol at all. Delsym  maybe once a day Thinks neurontin making her dizzy, a bit less on 300 bid than tid  No obvious day to day or daytime variabilty or assoc sob or cp or chest tightness, subjective wheeze overt sinus or hb symptoms. No unusual exp hx or h/o childhood pna/ asthma or knowledge of premature birth.  Sleeping ok without nocturnal  or early am exacerbation  of respiratory  c/o's or need for noct saba. Also denies any obvious fluctuation of symptoms with weather or environmental changes or other aggravating or alleviating factors except as outlined above   Current Medications, Allergies, Complete Past Medical History, Past Surgical History, Family History, and Social History were reviewed in [employer] record.  ROS  The following are not active complaints unless bolded sore throat, dysphagia, dental problems, itching, sneezing,  nasal congestion or excess/ purulent secretions, ear ache,   fever, chills, sweats, unintended wt loss, pleuritic or exertional cp, hemoptysis,  orthopnea pnd or leg swelling, presyncope, palpitations, heartburn, abdominal pain, anorexia, nausea, vomiting, diarrhea  or change in bowel or urinary habits, change in stools or urine, dysuria,hematuria,  rash, arthralgias, visual complaints, headache, numbness weakness or ataxia or problems with walking or coordination,  change in mood/affect or memory.                   Objective:   Physical Exam   Min  hoarse amb wf nad  09/02/2012        144 >142 10/02/2012 > 10/23/2012  146    HEENT: nl dentition, turbinates, and orophanx. Nl external ear canals without cough reflex   NECK :  without JVD/Nodes/TM/ nl carotid upstrokes bilaterally   LUNGS: no acc muscle use, clear to A and P bilaterally without cough on insp or exp maneuvers   CV:  RRR  no s3 or murmur or increase in P2, no edema   ABD:  soft and nontender with nl excursion in the supine position. No bruits or organomegaly, bowel sounds nl  MS:  warm without deformities, calf tenderness, cyanosis or clubbing  SKIN: warm and dry without lesions           CXR  08/04/2012 :   Pulmonary hyperinflation. No acute abnormality.   Sinus ct 10/23/2012 Clear paranasal sinuses.      Assessment & Plan:

## 2012-10-23 NOTE — Assessment & Plan Note (Addendum)
-   PFT's 02/2010 nl in effort dep portion of f/v loop, lung vol and dlcos also nl  - CT chest 05/27/12 wnl x small HH - 08/04/12   [facility] AT >  MM - informed 09/10/2012 had not received demerol rx on 09/02/12  - sinus ct 10/23/2012 >>> Clear paranasal sinuses.  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   These are the same pts (now being labeled as having "irritable larynx syndrome" by some cough centers) who not infrequently have a history of having failed to tolerate ace inhibitors,  dry powder inhalers or biphosphonates or report having atypical reflux symptoms that don't respond to standard doses of PPI , and are easily confused as having aecopd or asthma flares by even experienced allergists/ pulmonologists.  Try taper neurontin to 100 mg qid plus max gerd rx.     Each maintenance medication was reviewed in detail including most importantly the difference between maintenance and as needed and under what circumstances the prns are to be used. This was done in the context of a medication calendar review which provided the patient with a user-friendly unambiguous mechanism for medication administration and reconciliation and provides an action plan for all active problems. It is critical that this be shown to every doctor  for modification during the office visit if necessary so the patient can use it as a working document.

## 2012-10-23 NOTE — Telephone Encounter (Signed)
Per OV 10/23/12: Patient Instructions    Please see patient coordinator before you leave today  to schedule sinus ct Change neurontin 100 mg to 4 x daily as per calendar  See calendar for specific medication instructions and bring it back for each and every office visit for every healthcare provider you see.  Without it,  you may not receive the best quality medical care that we feel you deserve. You will note that the calendar groups together  your maintenance  medications that are timed at particular times of the day.  Think of this as your checklist for what your doctor has instructed you to do until your next evaluation to see what benefit  there is  to staying on a consistent group of medications intended to keep you well.  The other group at the bottom is entirely up to you to use as you see fit  for specific symptoms that may arise between visits that require you to treat them on an as needed basis.  Think of this as your action plan or "what if" list.  Separating the top medications from the bottom group is fundamental to providing you adequate care going forward.  Please schedule a follow up office visit in 6 weeks, call sooner if needed    --  Pt was calling to confirm RX for neurotin was sent. I advised her it looks like it was sent by MW this AM. Nothing further needed

## 2012-12-04 ENCOUNTER — Ambulatory Visit (INDEPENDENT_AMBULATORY_CARE_PROVIDER_SITE_OTHER): Payer: Medicare Other | Admitting: Internal Medicine

## 2012-12-04 ENCOUNTER — Encounter: Payer: Self-pay | Admitting: Internal Medicine

## 2012-12-04 VITALS — BP 138/72 | HR 73 | Temp 97.3°F | Ht 63.5 in | Wt 146.0 lb

## 2012-12-04 DIAGNOSIS — J310 Chronic rhinitis: Secondary | ICD-10-CM

## 2012-12-04 DIAGNOSIS — R059 Cough, unspecified: Secondary | ICD-10-CM

## 2012-12-04 DIAGNOSIS — R05 Cough: Secondary | ICD-10-CM

## 2012-12-04 HISTORY — DX: Chronic rhinitis: J31.0

## 2012-12-04 NOTE — Assessment & Plan Note (Signed)
First reported sev weeks prior to OV  Despite chronic cough was never aware of ant nasal discharge but eliminated with 1st gen h1 - therefore try zyrtec in am and if not tolerated or effective low threshold to add astelin or atrovent nasal spray assuming this is vasomotor rather than allergic (though hard to tell for sure)    Each maintenance medication was reviewed in detail including most importantly the difference between maintenance and as needed and under what circumstances the prns are to be used. This was done in the context of a medication calendar review which provided the patient with a user-friendly unambiguous mechanism for medication administration and reconciliation and provides an action plan for all active problems. It is critical that this be shown to every doctor  for modification during the office visit if necessary so the patient can use it as a working document.

## 2012-12-04 NOTE — Progress Notes (Signed)
Subjective:    Patient ID: [name], female    DOB: 09/23/1945  MRN: [id number]    Brief patient profile:  30 yowf never smoker pt of Dr Shary Decamp with some ear infections assoc with passive smoke exp from father and subsequent problems with strep throats and tonsils removed age 67 since around 2008 more freq bronchitis referred 08/04/2012 to pulmonary clinic by Dr Brennan Bailey (her surgeon) with persistent cough since 2012.   HPI 08/04/2012 1st pulmonary eval cc daily x 2 years  worse after 4pm gradually worse until bedtime then some better usually dry or min prod white mucus assoc with hoarseness much better on prednisone.  rx with qvar and singulair by [facility] > allergy to dust, roach imp was GERD > rec was for NF Feb 2014  Then it resolved p Surgery and recurred over the next sev months. Cough occurs on breathing in and is quite violent.  L post cp x 2 years no different with cough.   CT chest and abd at [facility] > "nl" rec Cyclical cough regimen  08/18/2012 f/u ov/Rockney Grenz re cough Chief Complaint  Patient presents with  . Follow-up    pt reports cough remains, states it is a little better than before but still present, was unable to tolerate tramadol-- c/o pain under left rib that extends from back to front when lying down    Cough better for only first 2 days while on tramadol but then developed intolerance and cough reverted to baseline... Remains dry, daytime rec neurontin 100 tid   09/02/2012 f/u ov/Selah Zelman re cough Chief Complaint  Patient presents with  . Cough    Cough is slightly improved. Still present but not as bad.  rec Plan A= automatic = Pantoprazole 40 mg Take 30-60 min before first meal of the day and famotidine 20 mg and chlortrimeton 4 mg at 4 pm and at bedtime While neurontin to 300 mg three times a day Plan B= Backup = delsym 2 tsp every 12 hours Plan C = Demerol 50 mg up to one every 4 hours x 3 days max and no driving  Please schedule a follow up office visit in 2  weeks, sooner if needed    09/16/2012 f/u ov/Alina Gilkey re chronic cough Chief Complaint  Patient presents with  . Follow-up    Pt reports cough returned x 1 weeks ago, also c/o sore throat and tongue pain from sucking on candy-- will start demerol  today -- denies any other concerns at this time   >pred pack and cyclical cough regimen  10/02/2012 Follow up for cough and Med review   new med calendar - pt brought all meds with her today.  reports cough is "much better", with basically no cough the past 2 days We reviewed all her medications and organized them into a medication calendar with patient education Appears patient is taking her medications correctly. Patient feels that her cough is much improved. She has not coughed at all for 2 days. Patient does complain that Neurontin seems to be making her somewhat off balance at times.  rec Decrease Neurontin 300mg  Twice daily   Follow med calendar closely and bring to each visit.  10/23/2012 f/u ov/Radiance Deady re: chronic cough x 2 years- did not bring med calendar  Chief Complaint  Patient presents with  . Follow-up    Pt states that her cough has improved- she did have double ear infection and URI and was txed with zithromax approx 1 wk ago.  not using demerol at all. Delsym maybe once a day Thinks neurontin making her dizzy, a bit less on 300 bid than tid rec Please see patient coordinator before you leave today  to schedule sinus ct Change neurontin 100 mg to 4 x daily as per calendar   12/04/2012 f/u ov/Nuria Phebus re: chronic cough/ much better on neurontin Chief Complaint  Patient presents with  . Follow-up    Cough has improved since her last visit. She c/o rhinitis for the past 2-3 wks. She has not had to use demerol, but occ uses the delsym for the cough.   Drippy nose in am, watery anterior, no triggers, never in pm or sleeping (p chlortrimeton) -  cough controlled to her satisfaction with min delsym needed and never demerol   No obvious day  to day or daytime variabilty or assoc sob or cp or chest tightness, subjective wheeze overt   hb symptoms. No unusual exp hx or h/o childhood pna/ asthma or knowledge of premature birth.  Sleeping ok without nocturnal  or early am exacerbation  of respiratory  c/o's or need for noct saba. Also denies any obvious fluctuation of symptoms with weather or environmental changes or other aggravating or alleviating factors except as outlined above   Current Medications, Allergies, Complete Past Medical History, Past Surgical History, Family History, and Social History were reviewed in [employer] record.  ROS  The following are not active complaints unless bolded sore throat, dysphagia, dental problems, itching, sneezing,  nasal congestion or excess watery discharge/ purulent secretions, ear ache,   fever, chills, sweats, unintended wt loss, pleuritic or exertional cp, hemoptysis,  orthopnea pnd or leg swelling, presyncope, palpitations, heartburn, abdominal pain, anorexia, nausea, vomiting, diarrhea  or change in bowel or urinary habits, change in stools or urine, dysuria,hematuria,  rash, arthralgias L Knee (?for TKR Jan 2015), visual complaints, headache, numbness weakness or ataxia or problems with walking or coordination,  change in mood/affect or memory.                   Objective:   Physical Exam   Min  hoarse amb wf nad occ throat clearly   09/02/2012        144 >142 10/02/2012 > 10/23/2012  146 > 12/04/2012  146    HEENT: nl dentition, turbinates, and orophanx. Nl external ear canals without cough reflex   NECK :  without JVD/Nodes/TM/ nl carotid upstrokes bilaterally   LUNGS: no acc muscle use, clear to A and P bilaterally without cough on insp or exp maneuvers   CV:  RRR  no s3 or murmur or increase in P2, no edema   ABD:  soft and nontender with nl excursion in the supine position. No bruits or organomegaly, bowel sounds nl  MS:  warm without  deformities, calf tenderness, cyanosis or clubbing  SKIN: warm and dry without lesions           CXR  08/04/2012 :   Pulmonary hyperinflation. No acute abnormality.   Sinus ct 10/23/2012 Clear paranasal sinuses.      Assessment & Plan:

## 2012-12-04 NOTE — Patient Instructions (Addendum)
For drippy nose take zyrtec 10 mg each am as per med calendar  See calendar for specific medication instructions and bring it back for each and every office visit for every healthcare provider you see.  Without it,  you may not receive the best quality medical care that we feel you deserve.  You will note that the calendar groups together  your maintenance  medications that are timed at particular times of the day.  Think of this as your checklist for what your doctor has instructed you to do until your next evaluation to see what benefit  there is  to staying on a consistent group of medications intended to keep you well.  The other group at the bottom is entirely up to you to use as you see fit  for specific symptoms that may arise between visits that require you to treat them on an as needed basis.  Think of this as your action plan or "what if" list.   Separating the top medications from the bottom group is fundamental to providing you adequate care going forward.   Please schedule a follow up office visit in 6 weeks, call sooner if needed

## 2012-12-04 NOTE — Assessment & Plan Note (Signed)
-  [facility] eval around 2012 pos dust/ roach  Neg resp to singulair  - PFT's 02/2010 nl in effort dep portion of f/v loop, lung vol and dlcos also nl  - CT chest 05/27/12 wnl x small HH - 08/04/12   [facility] AT >  MM - informed 09/10/2012 had not received demerol rx on 09/02/12  - sinus ct 10/23/2012 >>> Clear paranasal sinuses. - responded to neurontin > continue x 6 more weeks

## 2013-01-15 ENCOUNTER — Ambulatory Visit (INDEPENDENT_AMBULATORY_CARE_PROVIDER_SITE_OTHER): Payer: Medicare Other | Admitting: Internal Medicine

## 2013-01-15 ENCOUNTER — Other Ambulatory Visit: Payer: Self-pay | Admitting: Internal Medicine

## 2013-01-15 ENCOUNTER — Encounter: Payer: Self-pay | Admitting: Internal Medicine

## 2013-01-15 VITALS — BP 122/60 | HR 70 | Temp 97.9°F | Ht 63.0 in | Wt 148.0 lb

## 2013-01-15 DIAGNOSIS — R059 Cough, unspecified: Secondary | ICD-10-CM

## 2013-01-15 DIAGNOSIS — R05 Cough: Secondary | ICD-10-CM

## 2013-01-15 MED ORDER — MEPERIDINE HCL 50 MG PO TABS: ORAL_TABLET | ORAL | Status: DC

## 2013-01-15 MED ORDER — GABAPENTIN 100 MG PO CAPS: 100.0000 mg | ORAL_CAPSULE | Freq: Four times a day (QID) | ORAL | Status: DC

## 2013-01-15 MED ORDER — FLUTTER DEVI: Status: DC

## 2013-01-15 NOTE — Addendum Note (Signed)
Addended by: Christinia Gully B on: 01/15/2013 10:06 AM   Modules accepted: Orders

## 2013-01-15 NOTE — Assessment & Plan Note (Addendum)
-  [facility] eval around 2012 pos dust/ roach  Neg resp to singulair  - PFT's 02/2010 nl in effort dep portion of f/v loop, lung vol and dlcos also nl  - CT chest 05/27/12 wnl x small HH - 08/04/12   [facility] AT >  MM - informed 09/10/2012 had not received demerol rx on 09/02/12  - sinus ct 10/23/2012 >>> Clear paranasal sinuses. 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   These are the same pts (now being labeled as having "irritable larynx syndrome" by some cough centers) who not infrequently have a history of having failed to tolerate ace inhibitors,  dry powder inhalers or biphosphonates or report having atypical reflux symptoms that don't respond to standard doses of PPI , and are easily confused as having aecopd or asthma flares by even experienced allergists/ pulmonologists.  She has not been able to eliminate cyclical cough issues to date nor kept up with a med calendar and not really clear she's better on neurontin so ok to try off and use the flutter valve to try to prevent cough induced airway trauma/ inflammation and  regroup in 4 weeks with all meds/ med calendar in hand    Each maintenance medication was reviewed in detail including most importantly the difference between maintenance and as needed and under what circumstances the prns are to be used.  Please see instructions for details which were reviewed in writing and the patient given a copy.

## 2013-01-15 NOTE — Progress Notes (Signed)
Subjective:    Patient ID: [name], female    DOB: [birth year]  MRN: [id number]    Brief patient profile:  86 yowf never smoker pt of Dr Bea Graff with some ear infections assoc with passive smoke exp from father and subsequent problems with strep throats and tonsils removed age 68 since around 2008 more freq bronchitis referred 08/04/2012 to pulmonary clinic by Dr Tilman Neat (her surgeon) with persistent cough since 2012.   HPI 08/04/2012 1st pulmonary eval cc daily x 2 years  worse after 4pm gradually worse until bedtime then some better usually dry or min prod white mucus assoc with hoarseness much better on prednisone.  rx with qvar and singulair by [facility] > allergy to dust, roach imp was GERD > rec was for NF Feb 2014  Then it resolved p Surgery and recurred over the next sev months. Cough occurs on breathing in and is quite violent.  L post cp x 2 years no different with cough.   CT chest and abd at [facility] > "nl" rec Cyclical cough regimen  08/18/2012 f/u ov/[name] re cough Chief Complaint  Patient presents with  . Follow-up    pt reports cough remains, states it is a little better than before but still present, was unable to tolerate tramadol-- c/o pain under left rib that extends from back to front when lying down   Cough better for only first 2 days while on tramadol but then developed intolerance and cough reverted to baseline... Remains dry, daytime rec neurontin 100 tid   09/02/2012 f/u ov/[name] re cough Chief Complaint  Patient presents with  . Cough    Cough is slightly improved. Still present but not as bad.  rec Plan A= automatic = Pantoprazole 40 mg Take 30-60 min before first meal of the day and famotidine 20 mg and chlortrimeton 4 mg at 4 pm and at bedtime While neurontin to 300 mg three times a day Plan B= Backup = delsym 2 tsp every 12 hours Plan C = Demerol 50 mg up to one every 4 hours x 3 days max and no driving  Please schedule a follow up office visit in 2  weeks, sooner if needed    09/16/2012 f/u ov/[name] re chronic cough Chief Complaint  Patient presents with  . Follow-up    Pt reports cough returned x 1 weeks ago, also c/o sore throat and tongue pain from sucking on candy-- will start demerol  today -- denies any other concerns at this time   >pred pack and cyclical cough regimen  10/02/2012 Follow up for cough and Med review   new med calendar - pt brought all meds with her today.  reports cough is "much better", with basically no cough the past 2 days We reviewed all her medications and organized them into a medication calendar with patient education Appears patient is taking her medications correctly. Patient feels that her cough is much improved. She has not coughed at all for 2 days. Patient does complain that Neurontin seems to be making her somewhat off balance at times.  rec Decrease Neurontin 300mg  Twice daily   Follow med calendar closely and bring to each visit.  10/23/2012 f/u ov/[name] re: chronic cough x 2 years- did not bring med calendar  Chief Complaint  Patient presents with  . Follow-up    Pt states that her cough has improved- she did have double ear infection and URI and was txed with zithromax approx 1 wk ago.  not using demerol at all. Delsym maybe once a day Thinks neurontin making her dizzy, a bit less on 300 bid than tid rec Please see patient coordinator before you leave today  to schedule sinus ct Change neurontin 100 mg to 4 x daily as per calendar   12/04/2012 f/u ov/[name] re: chronic cough/ much better on neurontin Chief Complaint  Patient presents with  . Follow-up    Cough has improved since her last visit. She c/o rhinitis for the past 2-3 wks. She has not had to use demerol, but occ uses the delsym for the cough.   Drippy nose in am, watery anterior, no triggers, never in pm or sleeping (p chlortrimeton) -  cough controlled to her satisfaction with min delsym needed and never demerol  rec For drippy  nose take zyrtec 10 mg each am as per med calendar   01/15/2013 f/u ov/[name] re:  Cough worse x 3 weeks worse before supper / no med calendar Chief Complaint  Patient presents with  . Follow-up    Pt c/o increased cough for the past 3 wks- occ produces minimal yellow sputum.  She also c/o rhinitis and heaviness in her chest. She is taking delsym at least once per day but has not used demerol.  no cough at all while sleeping or waking her in am, min actual sputum production Gaining wt on neurontin and wants to try off.   No obvious day to day or daytime variabilty or assoc sob or cp or chest tightness, subjective wheeze overt   hb symptoms. No unusual exp hx or h/o childhood pna/ asthma or knowledge of premature birth.    Also denies any obvious fluctuation of symptoms with weather or environmental changes or other aggravating or alleviating factors except as outlined above   Current Medications, Allergies, Complete Past Medical History, Past Surgical History, Family History, and Social History were reviewed in [employer] record.  ROS  The following are not active complaints unless bolded sore throat, dysphagia, dental problems, itching, sneezing,  nasal congestion or excess watery discharge/ purulent secretions, ear ache,   fever, chills, sweats, unintended wt loss, pleuritic or exertional cp, hemoptysis,  orthopnea pnd or leg swelling, presyncope, palpitations, heartburn, abdominal pain, anorexia, nausea, vomiting, diarrhea  or change in bowel or urinary habits, change in stools or urine, dysuria,hematuria,  rash, arthralgias L Knee (?for TKR Jan 2015), visual complaints, headache, numbness weakness or ataxia or problems with walking or coordination,  change in mood/affect or memory.                   Objective:   Physical Exam   Min  hoarse amb wf nad occ throat clearing  and harsh barking cough early on insp  09/02/2012        144 >142 10/02/2012 > 10/23/2012   146 > 12/04/2012  146    HEENT: nl dentition, turbinates, and orophanx which is pristine. Nl external ear canals without cough reflex   NECK :  without JVD/Nodes/TM/ nl carotid upstrokes bilaterally   LUNGS: no acc muscle use, clear to A and P bilaterally     CV:  RRR  no s3 or murmur or increase in P2, no edema   ABD:  soft and nontender with nl excursion in the supine position. No bruits or organomegaly, bowel sounds nl  MS:  warm without deformities, calf tenderness, cyanosis or clubbing  SKIN: warm and dry without lesions  CXR  08/04/2012 :   Pulmonary hyperinflation. No acute abnormality.   Sinus ct 10/23/2012 Clear paranasal sinuses.      Assessment & Plan:

## 2013-01-15 NOTE — Patient Instructions (Addendum)
Delsym 2 tsp every 12 hours and when you feel urge to cough, use the flutter valve as possible   When you can spend 3 straight days affording to be sleepy and not driving , then use the demerol to completely eliminate even the urge to clear your throat.  Try off neurontin and resume it if cough  flares   Please schedule a follow up office visit in 4 weeks, sooner if needed with all meds and med calendar in hand

## 2013-02-12 ENCOUNTER — Encounter: Payer: Self-pay | Admitting: Internal Medicine

## 2013-02-12 ENCOUNTER — Ambulatory Visit (INDEPENDENT_AMBULATORY_CARE_PROVIDER_SITE_OTHER)
Admission: RE | Admit: 2013-02-12 | Discharge: 2013-02-12 | Disposition: A | Discharge status: Home / Self Care | Payer: Medicare Other | Source: Ambulatory Visit | Attending: Internal Medicine | Admitting: Internal Medicine

## 2013-02-12 ENCOUNTER — Ambulatory Visit (INDEPENDENT_AMBULATORY_CARE_PROVIDER_SITE_OTHER): Payer: Medicare Other | Admitting: Internal Medicine

## 2013-02-12 VITALS — BP 142/80 | HR 62 | Temp 97.5°F | Ht 63.5 in | Wt 148.0 lb

## 2013-02-12 DIAGNOSIS — R059 Cough, unspecified: Secondary | ICD-10-CM

## 2013-02-12 DIAGNOSIS — R05 Cough: Secondary | ICD-10-CM

## 2013-02-12 DIAGNOSIS — J310 Chronic rhinitis: Secondary | ICD-10-CM

## 2013-02-12 MED ORDER — PREDNISONE 10 MG PO TABS: ORAL_TABLET | ORAL | Status: DC

## 2013-02-12 NOTE — Assessment & Plan Note (Signed)
-  [facility] eval around 2012 pos dust/ roach  Neg resp to singulair  - PFT's 02/2010 nl in effort dep portion of f/v loop, lung vol and dlcos also nl  - CT chest 05/27/12 wnl x small HH - 08/04/12   [facility] AT >  MM - informed 09/10/2012 had not received demerol rx on 09/02/12  - sinus ct 10/23/2012 >>> Clear paranasal sinuses. - responded to neurontin 12/04/2012  But pos wt gain so try off 01/15/2013  - added flutter valve 01/15/2013   Finally turning the corner on neurontin - difficult to know how much pnds really present but ok ok to try qid h1 if tolerates     Each maintenance medication was reviewed in detail including most importantly the difference between maintenance and as needed and under what circumstances the prns are to be used. This was done in the context of a medication calendar review which provided the patient with a user-friendly unambiguous mechanism for medication administration and reconciliation and provides an action plan for all active problems. It is critical that this be shown to every doctor  for modification during the office visit if necessary so the patient can use it as a working document.

## 2013-02-12 NOTE — Assessment & Plan Note (Signed)
Since zyrtec not effective worth trying qid 1st gen h1 per guidelines

## 2013-02-12 NOTE — Patient Instructions (Addendum)
Increase chlortrimeton to four times a day   For cough Delsym 2 tsp every 12 hours and when you feel urge to cough, use the flutter valve as much as possible   When you can spend 3 straight days affording to be sleepy and not driving , then use the demerol to completely eliminate even the urge to clear your throat while on Prednisone 10 mg take  4 each am x 2 days,   2 each am x 2 days,  1 each am x 2 days and stop   Please remember to go to the   x-ray department downstairs for your tests - we will call you with the results when they are available.    Please schedule a follow up office visit in 6 weeks, call sooner if needed to See Tammy NP for new med calendar   .

## 2013-02-12 NOTE — Progress Notes (Signed)
Subjective:    Patient ID: [name], female    DOB: [birth year]  MRN: [id number]    Brief patient profile:  86 yowf never smoker pt of Dr Bea Graff with some ear infections assoc with passive smoke exp from father and subsequent problems with strep throats and tonsils removed age 68 since around 2008 more freq bronchitis referred 08/04/2012 to pulmonary clinic by Dr Tilman Neat (her surgeon) with persistent cough since 2012.   HPI 08/04/2012 1st pulmonary eval cc daily x 2 years  worse after 4pm gradually worse until bedtime then some better usually dry or min prod white mucus assoc with hoarseness much better on prednisone.  rx with qvar and singulair by [facility] > allergy to dust, roach imp was GERD > rec was for NF Feb 2014  Then it resolved p Surgery and recurred over the next sev months. Cough occurs on breathing in and is quite violent.  L post cp x 2 years no different with cough.   CT chest and abd at [facility] > "nl" rec Cyclical cough regimen  08/18/2012 f/u ov/[name] re cough Chief Complaint  Patient presents with  . Follow-up    pt reports cough remains, states it is a little better than before but still present, was unable to tolerate tramadol-- c/o pain under left rib that extends from back to front when lying down   Cough better for only first 2 days while on tramadol but then developed intolerance and cough reverted to baseline... Remains dry, daytime rec neurontin 100 tid   09/02/2012 f/u ov/[name] re cough Chief Complaint  Patient presents with  . Cough    Cough is slightly improved. Still present but not as bad.  rec Plan A= automatic = Pantoprazole 40 mg Take 30-60 min before first meal of the day and famotidine 20 mg and chlortrimeton 4 mg at 4 pm and at bedtime While neurontin to 300 mg three times a day Plan B= Backup = delsym 2 tsp every 12 hours Plan C = Demerol 50 mg up to one every 4 hours x 3 days max and no driving  Please schedule a follow up office visit in 2  weeks, sooner if needed    09/16/2012 f/u ov/[name] re chronic cough Chief Complaint  Patient presents with  . Follow-up    Pt reports cough returned x 1 weeks ago, also c/o sore throat and tongue pain from sucking on candy-- will start demerol  today -- denies any other concerns at this time   >pred pack and cyclical cough regimen  10/02/2012 Follow up for cough and Med review   new med calendar - pt brought all meds with her today.  reports cough is "much better", with basically no cough the past 2 days We reviewed all her medications and organized them into a medication calendar with patient education Appears patient is taking her medications correctly. Patient feels that her cough is much improved. She has not coughed at all for 2 days. Patient does complain that Neurontin seems to be making her somewhat off balance at times.  rec Decrease Neurontin 300mg  Twice daily   Follow med calendar closely and bring to each visit.  10/23/2012 f/u ov/[name] re: chronic cough x 2 years- did not bring med calendar  Chief Complaint  Patient presents with  . Follow-up    Pt states that her cough has improved- she did have double ear infection and URI and was txed with zithromax approx 1 wk ago.  not using demerol at all. Delsym maybe once a day Thinks neurontin making her dizzy, a bit less on 300 bid than tid rec Please see patient coordinator before you leave today  to schedule sinus ct Change neurontin 100 mg to 4 x daily as per calendar   12/04/2012 f/u ov/[name] re: chronic cough/ much better on neurontin Chief Complaint  Patient presents with  . Follow-up    Cough has improved since her last visit. She c/o rhinitis for the past 2-3 wks. She has not had to use demerol, but occ uses the delsym for the cough.   Drippy nose in am, watery anterior, no triggers, never in pm or sleeping (p chlortrimeton) -  cough controlled to her satisfaction with min delsym needed and never demerol  rec For drippy  nose take zyrtec 10 mg each am as per med calendar   01/15/2013 f/u ov/[name] re:  Cough worse x 3 weeks worse before supper / no med calendar Chief Complaint  Patient presents with  . Follow-up    Pt c/o increased cough for the past 3 wks- occ produces minimal yellow sputum.  She also c/o rhinitis and heaviness in her chest. She is taking delsym at least once per day but has not used demerol.  no cough at all while sleeping or waking her in am, min actual sputum production Gaining wt on neurontin and wants to try off.  rec Delsym 2 tsp every 12 hours and when you feel urge to cough, use the flutter valve as possible  When you can spend 3 straight days affording to be sleepy and not driving , then use the demerol to completely eliminate even the urge to clear your throat. Try off neurontin and resume it if cough  flares > worse so restarted, never took demerol   02/12/2013 f/u ov/[name] re:  Cough x 3 y, best rx was neurontin Chief Complaint  Patient presents with  . Follow-up    Cough is some better- she did have some mild hemoptysis 1 wk ago and has had nose bleeds for the past wk.     Not limited by breathing from desired activities   Nose bleeds on asa  Sense of pnds no better on zyrtec No obvious day to day or daytime variabilty or assoc sob or cp or chest tightness, subjective wheeze overt   hb symptoms. No unusual exp hx or h/o childhood pna/ asthma or knowledge of premature birth.    Also denies any obvious fluctuation of symptoms with weather or environmental changes or other aggravating or alleviating factors except as outlined above   Current Medications, Allergies, Complete Past Medical History, Past Surgical History, Family History, and Social History were reviewed in [employer] record.  ROS  The following are not active complaints unless bolded sore throat, dysphagia, dental problems, itching, sneezing,  nasal congestion or excess watery discharge/  purulent secretions, ear ache,   fever, chills, sweats, unintended wt loss, pleuritic or exertional cp, hemoptysis,  orthopnea pnd or leg swelling, presyncope, palpitations, heartburn, abdominal pain, anorexia, nausea, vomiting, diarrhea  or change in bowel or urinary habits, change in stools or urine, dysuria,hematuria,  rash, arthralgias visual complaints, headache, numbness weakness or ataxia or problems with walking or coordination,  change in mood/affect or memory.                   Objective:   Physical Exam   Min  hoarse amb wf nad  09/02/2012        144 >142 10/02/2012 > 10/23/2012  146 > 12/04/2012  146  > 02/12/2013  148    HEENT: nl dentition, turbinates, and orophanx which is pristine. Nl external ear canals without cough reflex   NECK :  without JVD/Nodes/TM/ nl carotid upstrokes bilaterally   LUNGS: no acc muscle use, clear to A and P bilaterally     CV:  RRR  no s3 or murmur or increase in P2, no edema   ABD:  soft and nontender with nl excursion in the supine position. No bruits or organomegaly, bowel sounds nl  MS:  warm without deformities, calf tenderness, cyanosis or clubbing  SKIN: warm and dry without lesions        CXR  02/12/2013 : Cardiomediastinal silhouette is stable. No acute infiltrate or pleural effusion. No pulmonary edema. Mild degenerative changes thoracic spine. Mild hyperinflation.      Sinus ct 10/23/2012 Clear paranasal sinuses.      Assessment & Plan:

## 2013-02-13 NOTE — Progress Notes (Signed)
Quick Note:  LMTCB ______ 

## 2013-02-16 NOTE — Progress Notes (Signed)
Quick Note:  LMTCB ______ 

## 2013-02-18 ENCOUNTER — Telehealth: Payer: Self-pay | Admitting: Internal Medicine

## 2013-02-18 NOTE — Progress Notes (Signed)
Quick Note:  Pt aware per 2.18.15 phone note. ______

## 2013-02-18 NOTE — Telephone Encounter (Signed)
Per 2.12.15 cxr results: Result Notes    Notes Recorded by Rosana Berger, CMA on 02/16/2013 at 1:16 PM LMTCB ------  Notes Recorded by Rosana Berger, CMA on 02/13/2013 at 4:30 PM [facility] ------  Notes Recorded by Tanda Rockers, MD on 02/12/2013 at 5:08 PM Call pt: Reviewed cxr and no acute change so no change in recommendations made at [facility] spoke with patient and advised of cxr results / recs as stated by MW above.  Pt verbalized her understanding and denied any questions.  Nothing further needed; will sign off.

## 2013-03-25 ENCOUNTER — Ambulatory Visit (INDEPENDENT_AMBULATORY_CARE_PROVIDER_SITE_OTHER): Payer: Medicare Other | Admitting: Adult Health

## 2013-03-25 ENCOUNTER — Encounter: Payer: Self-pay | Admitting: Adult Health

## 2013-03-25 VITALS — BP 132/76 | HR 79 | Temp 97.9°F | Ht 63.5 in | Wt 144.2 lb

## 2013-03-25 DIAGNOSIS — R059 Cough, unspecified: Secondary | ICD-10-CM

## 2013-03-25 DIAGNOSIS — R058 Other specified cough: Secondary | ICD-10-CM

## 2013-03-25 DIAGNOSIS — R05 Cough: Secondary | ICD-10-CM

## 2013-03-25 MED ORDER — BENZONATATE 200 MG PO CAPS: 200.0000 mg | ORAL_CAPSULE | Freq: Three times a day (TID) | ORAL | Status: DC | PRN

## 2013-03-25 NOTE — Patient Instructions (Addendum)
May take Sudafed for sinus pressure -check with eye doctor to make okay d/t recent cataract surgery.  Take Dymista 1 puff Twice daily  Until sample gone.  Increase Chlortabs 4mg  Four times a day   Add Tessalon Three times a day  For cough.  Follow med calendar closely and bring to each visit.  follow up Dr. Melvyn Novas  In 4-6  weeks and As needed   Please contact office for sooner follow up if symptoms do not improve or worsen or seek emergency care

## 2013-03-26 NOTE — Progress Notes (Signed)
Subjective:    Patient ID: [name], female    DOB: [birth year]  MRN: [id number]    Brief patient profile:  78 yowf never smoker pt of Dr Bea Graff with some ear infections assoc with passive smoke exp from father and subsequent problems with strep throats and tonsils removed age 68 since around 2008 more freq bronchitis referred 08/04/2012 to pulmonary clinic by Dr Tilman Neat (her surgeon) with persistent cough since 2012.   HPI 08/04/2012 1st pulmonary eval cc daily x 2 years  worse after 4pm gradually worse until bedtime then some better usually dry or min prod white mucus assoc with hoarseness much better on prednisone.  rx with qvar and singulair by [facility] > allergy to dust, roach imp was GERD > rec was for NF Feb 2014  Then it resolved p Surgery and recurred over the next sev months. Cough occurs on breathing in and is quite violent.  L post cp x 2 years no different with cough.   CT chest and abd at [facility] > "nl" rec Cyclical cough regimen  08/18/2012 f/u ov/Wert re cough Chief Complaint  Patient presents with  . Follow-up    pt reports cough remains, states it is a little better than before but still present, was unable to tolerate tramadol-- c/o pain under left rib that extends from back to front when lying down   Cough better for only first 2 days while on tramadol but then developed intolerance and cough reverted to baseline... Remains dry, daytime rec neurontin 100 tid   09/02/2012 f/u ov/Wert re cough Chief Complaint  Patient presents with  . Cough    Cough is slightly improved. Still present but not as bad.  rec Plan A= automatic = Pantoprazole 40 mg Take 30-60 min before first meal of the day and famotidine 20 mg and chlortrimeton 4 mg at 4 pm and at bedtime While neurontin to 300 mg three times a day Plan B= Backup = delsym 2 tsp every 12 hours Plan C = Demerol 50 mg up to one every 4 hours x 3 days max and no driving  Please schedule a follow up office visit in 2  weeks, sooner if needed    09/16/2012 f/u ov/Wert re chronic cough Chief Complaint  Patient presents with  . Follow-up    Pt reports cough returned x 1 weeks ago, also c/o sore throat and tongue pain from sucking on candy-- will start demerol  today -- denies any other concerns at this time   >pred pack and cyclical cough regimen  10/02/2012 Follow up for cough and Med review   new med calendar - pt brought all meds with her today.  reports cough is "much better", with basically no cough the past 2 days We reviewed all her medications and organized them into a medication calendar with patient education Appears patient is taking her medications correctly. Patient feels that her cough is much improved. She has not coughed at all for 2 days. Patient does complain that Neurontin seems to be making her somewhat off balance at times.  rec Decrease Neurontin 300mg  Twice daily   Follow med calendar closely and bring to each visit.  10/23/2012 f/u ov/Wert re: chronic cough x 2 years- did not bring med calendar  Chief Complaint  Patient presents with  . Follow-up    Pt states that her cough has improved- she did have double ear infection and URI and was txed with zithromax approx 1 wk ago.  not using demerol at all. Delsym maybe once a day Thinks neurontin making her dizzy, a bit less on 300 bid than tid rec Please see patient coordinator before you leave today  to schedule sinus ct Change neurontin 100 mg to 4 x daily as per calendar   12/04/2012 f/u ov/Wert re: chronic cough/ much better on neurontin Chief Complaint  Patient presents with  . Follow-up    Cough has improved since her last visit. She c/o rhinitis for the past 2-3 wks. She has not had to use demerol, but occ uses the delsym for the cough.   Drippy nose in am, watery anterior, no triggers, never in pm or sleeping (p chlortrimeton) -  cough controlled to her satisfaction with min delsym needed and never demerol  rec For drippy  nose take zyrtec 10 mg each am as per med calendar   01/15/2013 f/u ov/Wert re:  Cough worse x 3 weeks worse before supper / no med calendar Chief Complaint  Patient presents with  . Follow-up    Pt c/o increased cough for the past 3 wks- occ produces minimal yellow sputum.  She also c/o rhinitis and heaviness in her chest. She is taking delsym at least once per day but has not used demerol.  no cough at all while sleeping or waking her in am, min actual sputum production Gaining wt on neurontin and wants to try off.  rec Delsym 2 tsp every 12 hours and when you feel urge to cough, use the flutter valve as possible  When you can spend 3 straight days affording to be sleepy and not driving , then use the demerol to completely eliminate even the urge to clear your throat. Try off neurontin and resume it if cough  flares > worse so restarted, never took demerol   02/12/2013 f/u ov/Wert re:  Cough x 3 y, best rx was neurontin Chief Complaint  Patient presents with  . Follow-up    Cough is some better- she did have some mild hemoptysis 1 wk ago and has had nose bleeds for the past wk.     Not limited by breathing from desired activities   Nose bleeds on asa  Sense of pnds no better on zyrtec No obvious day to day or daytime variabilty or assoc sob or cp or chest tightness, subjective wheeze overt   hb symptoms. No unusual exp hx or h/o childhood pna/ asthma or knowledge of premature birth. >>pred taper . Chlor tab Four times a day    03/25/13 Follow up and Med Review  Patient returns for a followup and medication review. We  reviewed all her medications. Organized them into a medication with patient education. Last visit. She was given a prednisone taper. And instructed increase her chlor tabs to 4 times daily. However, she misunderstood the instructions, and did not increase her chlor tabs .  Still having lingering cough with occasional clear/white mucus, wheezing/dyspnea/tightness during  coughing spells Denies any hemoptysis, orthopnea, PND, or discolored mucus. No fever Complains of a lot of throat clearing and post nasal drip.   Current Medications, Allergies, Complete Past Medical History, Past Surgical History, Family History, and Social History were reviewed in [employer] record.  ROS  The following are not active complaints unless bolded sore throat, dysphagia, dental problems, itching, sneezing,  nasal congestion or excess watery discharge/ purulent secretions, ear ache,   fever, chills, sweats, unintended wt loss, pleuritic or exertional cp, hemoptysis,  orthopnea pnd or  leg swelling, presyncope, palpitations, heartburn, abdominal pain, anorexia, nausea, vomiting, diarrhea  or change in bowel or urinary habits, change in stools or urine, dysuria,hematuria,  rash, arthralgias visual complaints, headache, numbness weakness or ataxia or problems with walking or coordination,  change in mood/affect or memory.                   Objective:   Physical Exam   Min  hoarse amb wf nad    09/02/2012        144 >142 10/02/2012 > 10/23/2012  146 > 12/04/2012  146  > 02/12/2013  148 >144 03/26/2013    HEENT: nl dentition, turbinates, and orophanx which is pristine. Nl external ear canals without cough reflex   NECK :  without JVD/Nodes/TM/ nl carotid upstrokes bilaterally   LUNGS: no acc muscle use, clear to A and P bilaterally     CV:  RRR  no s3 or murmur or increase in P2, no edema   ABD:  soft and nontender with nl excursion in the supine position. No bruits or organomegaly, bowel sounds nl  MS:  warm without deformities, calf tenderness, cyanosis or clubbing  SKIN: warm and dry without lesions        CXR  02/12/2013 : Cardiomediastinal silhouette is stable. No acute infiltrate or pleural effusion. No pulmonary edema. Mild degenerative changes thoracic spine. Mild hyperinflation.      Sinus ct 10/23/2012 Clear paranasal  sinuses.      Assessment & Plan:

## 2013-03-26 NOTE — Assessment & Plan Note (Signed)
Patient's medications were reviewed today and patient education was given. Computerized medication calendar was adjusted/completed   Plan   May take Sudafed for sinus pressure -check with eye doctor to make okay d/t recent cataract surgery.  Take Dymista 1 puff Twice daily  Until sample gone.  Increase Chlortabs 4mg  Four times a day   Add Tessalon Three times a day  For cough.  Follow med calendar closely and bring to each visit.  follow up Dr. Melvyn Novas  In 4-6  weeks and As needed   Please contact office for sooner follow up if symptoms do not improve or worsen or seek emergency care

## 2013-03-30 NOTE — Addendum Note (Signed)
Addended by: Parke Poisson E on: 03/30/2013 01:49 PM   Modules accepted: Orders, Medications

## 2013-04-22 ENCOUNTER — Ambulatory Visit: Payer: Medicare Other | Admitting: Internal Medicine

## 2013-05-28 ENCOUNTER — Ambulatory Visit (INDEPENDENT_AMBULATORY_CARE_PROVIDER_SITE_OTHER): Payer: Medicare Other | Admitting: Internal Medicine

## 2013-05-28 ENCOUNTER — Encounter: Payer: Self-pay | Admitting: Internal Medicine

## 2013-05-28 VITALS — BP 120/70 | HR 83 | Temp 98.0°F | Ht 63.5 in | Wt 144.4 lb

## 2013-05-28 DIAGNOSIS — R059 Cough, unspecified: Secondary | ICD-10-CM

## 2013-05-28 DIAGNOSIS — R05 Cough: Secondary | ICD-10-CM

## 2013-05-28 DIAGNOSIS — J310 Chronic rhinitis: Secondary | ICD-10-CM

## 2013-05-28 DIAGNOSIS — R058 Other specified cough: Secondary | ICD-10-CM

## 2013-05-28 MED ORDER — PREDNISONE 10 MG PO TABS: ORAL_TABLET | ORAL | Status: DC

## 2013-05-28 NOTE — Progress Notes (Addendum)
Subjective:    Patient ID: [name], female    DOB: [birth year]  MRN: [id number]    Brief patient profile:  68 yowf never smoker pt of Dr Bea Graff with some ear infections assoc with passive smoke exp from father and subsequent problems with strep throats and tonsils removed age 68 since around 2008 more freq bronchitis referred 08/04/2012 to pulmonary clinic by Dr Tilman Neat (her surgeon) with persistent cough since 2012.   HPI 08/04/2012 1st pulmonary eval cc daily x 2 years  worse after 4pm gradually worse until bedtime then some better usually dry or min prod white mucus assoc with hoarseness much better on prednisone.  rx with qvar and singulair by [facility] > allergy to dust, roach imp was GERD > rec was for NF Feb 2014  Then it resolved p Surgery and recurred over the next sev months. Cough occurs on breathing in and is quite violent.  L post cp x 2 years no different with cough.   CT chest and abd at [facility] > "nl" rec Cyclical cough regimen  08/18/2012 f/u ov/Wert re cough Chief Complaint  Patient presents with  . Follow-up    pt reports cough remains, states it is a little better than before but still present, was unable to tolerate tramadol-- c/o pain under left rib that extends from back to front when lying down   Cough better for only first 2 days while on tramadol but then developed intolerance and cough reverted to baseline... Remains dry, daytime rec neurontin 100 tid   09/02/2012 f/u ov/Wert re cough Chief Complaint  Patient presents with  . Cough    Cough is slightly improved. Still present but not as bad.  rec Plan A= automatic = Pantoprazole 40 mg Take 30-60 min before first meal of the day and famotidine 20 mg and chlortrimeton 4 mg at 4 pm and at bedtime While neurontin to 300 mg three times a day Plan B= Backup = delsym 2 tsp every 12 hours Plan C = Demerol 50 mg up to one every 4 hours x 3 days max and no driving  Please schedule a follow up office visit in 2  weeks, sooner if needed    09/16/2012 f/u ov/Wert re chronic cough Chief Complaint  Patient presents with  . Follow-up    Pt reports cough returned x 1 weeks ago, also c/o sore throat and tongue pain from sucking on candy-- will start demerol  today -- denies any other concerns at this time   >pred pack and cyclical cough regimen  10/02/2012 Follow up for cough and Med review   new med calendar - pt brought all meds with her today.  reports cough is "much better", with basically no cough the past 2 days We reviewed all her medications and organized them into a medication calendar with patient education Appears patient is taking her medications correctly. Patient feels that her cough is much improved. She has not coughed at all for 2 days. Patient does complain that Neurontin seems to be making her somewhat off balance at times.  rec Decrease Neurontin 300mg  Twice daily   Follow med calendar closely and bring to each visit.  10/23/2012 f/u ov/Wert re: chronic cough x 2 years- did not bring med calendar  Chief Complaint  Patient presents with  . Follow-up    Pt states that her cough has improved- she did have double ear infection and URI and was txed with zithromax approx 1 wk ago.  not using demerol at all. Delsym maybe once a day Thinks neurontin making her dizzy, a bit less on 300 bid than tid rec Please see patient coordinator before you leave today  to schedule sinus ct Change neurontin 100 mg to 4 x daily as per calendar   12/04/2012 f/u ov/Wert re: chronic cough/ much better on neurontin Chief Complaint  Patient presents with  . Follow-up    Cough has improved since her last visit. She c/o rhinitis for the past 2-3 wks. She has not had to use demerol, but occ uses the delsym for the cough.   Drippy nose in am, watery anterior, no triggers, never in pm or sleeping (p chlortrimeton) -  cough controlled to her satisfaction with min delsym needed and never demerol  rec For drippy  nose take zyrtec 10 mg each am as per med calendar   01/15/2013 f/u ov/Wert re:  Cough worse x 3 weeks worse before supper / no med calendar Chief Complaint  Patient presents with  . Follow-up    Pt c/o increased cough for the past 3 wks- occ produces minimal yellow sputum.  She also c/o rhinitis and heaviness in her chest. She is taking delsym at least once per day but has not used demerol.  no cough at all while sleeping or waking her in am, min actual sputum production Gaining wt on neurontin and wants to try off.  rec Delsym 2 tsp every 12 hours and when you feel urge to cough, use the flutter valve as possible  When you can spend 3 straight days affording to be sleepy and not driving , then use the demerol to completely eliminate even the urge to clear your throat. Try off neurontin and resume it if cough  flares > worse so restarted, never took demerol   02/12/2013 f/u ov/Wert re:  Cough x 3 y, best rx was neurontin Chief Complaint  Patient presents with  . Follow-up    Cough is some better- she did have some mild hemoptysis 1 wk ago and has had nose bleeds for the past wk.     Not limited by breathing from desired activities   Nose bleeds on asa  Sense of pnds no better on zyrtec No obvious day to day or daytime variabilty or assoc sob or cp or chest tightness, subjective wheeze overt   hb symptoms. No unusual exp hx or h/o childhood pna/ asthma or knowledge of premature birth. >> Increase chlortrimeton to four times a day  For cough Delsym 2 tsp every 12 hours and when you feel urge to cough, use the flutter valve as much as possible  When you can spend 3 straight days affording to be sleepy and not driving , then use the demerol to completely eliminate even the urge to clear your throat while on Prednisone 10 mg take  4 each am x 2 days,   2 each am x 2 days,  1 each am x 2 days and stop    03/25/13 Follow up and Med Review  Patient returns for a followup and medication review.  We  reviewed all her medications. Organized them into a medication with patient education. Last visit. She was given a prednisone taper. And instructed increase her chlor tabs to 4 times daily. However, she misunderstood the instructions, and did not increase her chlor tabs .  Still having lingering cough with occasional clear/white mucus, wheezing/dyspnea/tightness during coughing spells Denies any hemoptysis, orthopnea, PND, or discolored mucus. No  fever Complains of a lot of throat clearing and post nasal drip. rec May take Sudafed for sinus pressure -check with eye doctor to make okay d/t recent cataract surgery.  Take Dymista 1 puff Twice daily  Until sample gone.  Increase Chlortabs 4mg  Four times a day   Add Tessalon Three times a day  For cough.     05/28/2013 f/u ov/Wert re: cough x 3 y Chief Complaint  Patient presents with  . Acute Visit    Pt c/o increased cough x 2 wks- non prod cough.  She has c/o runny nose.   still on neurontin 100 qid but no med calendar Cough is dry, day > night and never eliminated even on demerol 100 q 4 Nose running despite 1st gen h1 qid day >> night     No obvious day to day or daytime variabilty or assoc sob  or cp or chest tightness, subjective wheeze overt sinus or hb symptoms. No unusual exp hx or h/o childhood pna/ asthma or knowledge of premature birth.  Sleeping ok without nocturnal  or early am exacerbation  of respiratory  c/o's or need for noct saba. Also denies any obvious fluctuation of symptoms with weather or environmental changes or other aggravating or alleviating factors except as outlined above   Current Medications, Allergies, Complete Past Medical History, Past Surgical History, Family History, and Social History were reviewed in [employer] record.  ROS  The following are not active complaints unless bolded sore throat, dysphagia, dental problems, itching, sneezing,  nasal congestion or excess/ purulent  secretions, ear ache,   fever, chills, sweats, unintended wt loss, pleuritic or exertional cp, hemoptysis,  orthopnea pnd or leg swelling, presyncope, palpitations, heartburn, abdominal pain, anorexia, nausea, vomiting, diarrhea  or change in bowel or urinary habits, change in stools or urine, dysuria,hematuria,  rash, arthralgias, visual complaints, headache, numbness weakness or ataxia or problems with walking or coordination,  change in mood/affect or memory.                       Objective:   Physical Exam   Min  hoarse amb wf nad with freq throat clearing and no candy handy  09/02/2012        144 >142 10/02/2012 > 10/23/2012  146 > 12/04/2012  146  > 02/12/2013  148 >144 03/26/2013 > 144  05/28/2013    HEENT: nl dentition, turbinates, and orophanx which is pristine. Nl external ear canals without cough reflex   NECK :  without JVD/Nodes/TM/ nl carotid upstrokes bilaterally   LUNGS: no acc muscle use, clear to A and P bilaterally     CV:  RRR  no s3 or murmur or increase in P2, no edema   ABD:  soft and nontender with nl excursion in the supine position. No bruits or organomegaly, bowel sounds nl  MS:  warm without deformities, calf tenderness, cyanosis or clubbing  SKIN: warm and dry without lesions        CXR  02/12/2013 : Cardiomediastinal silhouette is stable. No acute infiltrate or pleural effusion. No pulmonary edema. Mild degenerative changes thoracic spine. Mild hyperinflation.      Sinus ct 10/23/2012 Clear paranasal sinuses.      Assessment & Plan:

## 2013-05-28 NOTE — Patient Instructions (Addendum)
Prednisone 10 mg take  4 each am x 2 days,   2 each am x 2 days,  1 each am x 2 days and stop   Please see patient coordinator before you leave today  to schedule methacholine challenge test - if this is negative for asthma we will send you to the voice center at [facility]

## 2013-05-29 NOTE — Assessment & Plan Note (Signed)
-   sinus ct 10/23/2012 >>> Clear paranasal sinuses. - trial of qid 1st gen H1 02/12/2013 > not effective 05/28/13 > rec Prednisone 10 mg take  4 each am x 2 days,   2 each am x 2 days,  1 each am x 2 days and stop   See instructions for specific recommendations which were reviewed directly with the patient who was given a copy with highlighter outlining the key components.

## 2013-05-29 NOTE — Assessment & Plan Note (Signed)
-  [facility] eval around 2012 pos dust/ roach  Neg resp to singulair  - PFT's 02/2010 nl in effort dep portion of f/v loop, lung vol and dlcos also nl  - CT chest 05/27/12 wnl x small HH - 08/04/12   [facility] AT >  MM - informed 09/10/2012 had not received demerol rx on 09/02/12  - sinus ct 10/23/2012 >>> Clear paranasal sinuses. - responded to neurontin 12/04/2012  But pos wt gain so try off 01/15/2013  - added flutter valve 01/15/2013  -med calendar 03/25/13 > did not bring back to clinic 05/28/13 - Methacholine challenge test ordered 05/28/13   The standardized cough guidelines published in Chest by Lissa Morales in 2006 are still the best available and consist of a multiple step process (up to 12!) , not a single office visit,  and are intended  to address this problem logically,  with an alogrithm dependent on response to empiric treatment at  each progressive step  to determine a specific diagnosis with  minimal addtional testing needed. Therefore if adherence is an issue or can't be accurately verified,  it's very unlikely the standard evaluation and treatment will be successful here.    Furthermore, response to therapy (other than acute cough suppression, which should only be used short term with avoidance of narcotic containing cough syrups if possible), can be a gradual process for which the patient may perceive immediate benefit.  Unlike going to an eye doctor where the best perscription is almost always the first one and is immediately effective, this is almost never the case in the management of chronic cough syndromes. Therefore the patient needs to commit up front to consistently adhere to recommendations  for up to 6 weeks of therapy directed at the likely underlying problem(s) before the response can be reasonably evaluated.   The fact that she's not using the med calendar as rec suggests she's really not following the recs consistently enough to arrive at a specific dx through this clinic > will  check MCT and if neg refer to [facility] voice center

## 2013-06-03 ENCOUNTER — Ambulatory Visit (HOSPITAL_COMMUNITY)
Admission: RE | Admit: 2013-06-03 | Discharge: 2013-06-03 | Disposition: A | Discharge status: Home / Self Care | Payer: Medicare Other | Source: Ambulatory Visit | Attending: Internal Medicine | Admitting: Internal Medicine

## 2013-06-03 DIAGNOSIS — R05 Cough: Secondary | ICD-10-CM | POA: Diagnosis present

## 2013-06-03 DIAGNOSIS — R058 Other specified cough: Secondary | ICD-10-CM

## 2013-06-03 DIAGNOSIS — R059 Cough, unspecified: Secondary | ICD-10-CM | POA: Insufficient documentation

## 2013-06-03 MED ORDER — METHACHOLINE 4 MG/ML NEB SOLN
2.0000 mL | Freq: Once | RESPIRATORY_TRACT | Status: AC
  Administered 2013-06-03: 8 mg via RESPIRATORY_TRACT

## 2013-06-03 MED ORDER — METHACHOLINE 1 MG/ML NEB SOLN
2.0000 mL | Freq: Once | RESPIRATORY_TRACT | Status: AC
  Administered 2013-06-03: 2 mg via RESPIRATORY_TRACT

## 2013-06-03 MED ORDER — METHACHOLINE 0.0625 MG/ML NEB SOLN
2.0000 mL | Freq: Once | RESPIRATORY_TRACT | Status: AC
  Administered 2013-06-03: 0.125 mg via RESPIRATORY_TRACT

## 2013-06-03 MED ORDER — SODIUM CHLORIDE 0.9 % IN NEBU
3.0000 mL | INHALATION_SOLUTION | Freq: Once | RESPIRATORY_TRACT | Status: AC
  Administered 2013-06-03: 3 mL via RESPIRATORY_TRACT

## 2013-06-03 MED ORDER — ALBUTEROL SULFATE (2.5 MG/3ML) 0.083% IN NEBU
2.5000 mg | INHALATION_SOLUTION | Freq: Once | RESPIRATORY_TRACT | Status: AC
  Administered 2013-06-03: 2.5 mg via RESPIRATORY_TRACT

## 2013-06-03 MED ORDER — METHACHOLINE 0.25 MG/ML NEB SOLN
2.0000 mL | Freq: Once | RESPIRATORY_TRACT | Status: AC
  Administered 2013-06-03: 0.5 mg via RESPIRATORY_TRACT

## 2013-06-03 MED ORDER — METHACHOLINE 16 MG/ML NEB SOLN
2.0000 mL | Freq: Once | RESPIRATORY_TRACT | Status: AC
  Administered 2013-06-03: 32 mg via RESPIRATORY_TRACT

## 2013-06-11 ENCOUNTER — Telehealth: Payer: Self-pay | Admitting: Internal Medicine

## 2013-06-11 DIAGNOSIS — R058 Other specified cough: Secondary | ICD-10-CM

## 2013-06-11 DIAGNOSIS — R05 Cough: Secondary | ICD-10-CM

## 2013-06-11 NOTE — Telephone Encounter (Signed)
Pt aware of results and appt was scheduled

## 2013-06-11 NOTE — Telephone Encounter (Signed)
Equivocal study, clinically reproduced most of her symptoms so rec f/u ov to try on qvar

## 2013-06-11 NOTE — Telephone Encounter (Signed)
Pt had methacholine challenge done. Pt requesting results. Please advise MW thanks

## 2013-06-17 ENCOUNTER — Ambulatory Visit (INDEPENDENT_AMBULATORY_CARE_PROVIDER_SITE_OTHER): Payer: Medicare Other | Admitting: Internal Medicine

## 2013-06-17 ENCOUNTER — Encounter: Payer: Self-pay | Admitting: Internal Medicine

## 2013-06-17 VITALS — BP 120/64 | HR 80 | Temp 98.1°F | Ht 61.75 in | Wt 145.6 lb

## 2013-06-17 DIAGNOSIS — R059 Cough, unspecified: Secondary | ICD-10-CM

## 2013-06-17 DIAGNOSIS — R05 Cough: Secondary | ICD-10-CM

## 2013-06-17 DIAGNOSIS — J45991 Cough variant asthma: Secondary | ICD-10-CM

## 2013-06-17 DIAGNOSIS — R058 Other specified cough: Secondary | ICD-10-CM

## 2013-06-17 MED ORDER — PREDNISONE 10 MG PO TABS: ORAL_TABLET | ORAL | Status: DC

## 2013-06-17 MED ORDER — MEPERIDINE HCL 50 MG PO TABS: ORAL_TABLET | ORAL | Status: DC

## 2013-06-17 NOTE — Progress Notes (Signed)
Subjective:    Patient ID: [name], female    DOB: February 22, 1945  MRN: [id number]    Brief patient profile:  63 yowf never smoker pt of Dr Bea Graff with some ear infections as child assoc with passive smoke exp from father and subsequent problems with strep throats and tonsils removed age 68 then since around 2008 more freq"bronchitis" referred 08/04/2012 to pulmonary clinic by Dr Tilman Neat (her surgeon) with persistent cough since 2012.   HPI 08/04/2012 1st pulmonary eval cc daily x 2 years  worse after 4pm gradually worse until bedtime then some better usually dry or min prod white mucus assoc with hoarseness much better on prednisone.  rx with qvar and singulair by [facility] > allergy to dust, roach imp was GERD > rec was for NF Feb 2014  Then it resolved p Surgery and recurred over the next sev months. Cough occurs on breathing in and is quite violent.  L post cp x 2 years no different with cough.   CT chest and abd at [facility] > "nl" rec Cyclical cough regimen  08/18/2012 f/u ov/Wert re cough Chief Complaint  Patient presents with  . Follow-up    pt reports cough remains, states it is a little better than before but still present, was unable to tolerate tramadol-- c/o pain under left rib that extends from back to front when lying down   Cough better for only first 2 days while on tramadol but then developed intolerance and cough reverted to baseline... Remains dry, daytime rec neurontin 100 tid   09/02/2012 f/u ov/Wert re cough Chief Complaint  Patient presents with  . Cough    Cough is slightly improved. Still present but not as bad.  rec Plan A= automatic = Pantoprazole 40 mg Take 30-60 min before first meal of the day and famotidine 20 mg and chlortrimeton 4 mg at 4 pm and at bedtime Increase  neurontin to 300 mg three times a day Plan B= Backup = delsym 2 tsp every 12 hours Plan C = Demerol 50 mg up to one every 4 hours x 3 days max and no driving  Please schedule a follow up  office visit in 2 weeks, sooner if needed    09/16/2012 f/u ov/Wert re chronic cough Chief Complaint  Patient presents with  . Follow-up    Pt reports cough returned x 1 weeks ago, also c/o sore throat and tongue pain from sucking on candy-- will start demerol  today -- denies any other concerns at this time   >pred pack and cyclical cough regimen  10/02/2012 Follow up for cough and Med review   new med calendar - pt brought all meds with her today.  reports cough is "much better", with basically no cough the past 2 days We reviewed all her medications and organized them into a medication calendar with patient education Appears patient is taking her medications correctly. Patient feels that her cough is much improved. She has not coughed at all for 2 days. Patient does complain that Neurontin seems to be making her somewhat off balance at times.  rec Decrease Neurontin 300mg  Twice daily   Follow med calendar closely and bring to each visit.  10/23/2012 f/u ov/Wert re: chronic cough x 2 years- did not bring med calendar  Chief Complaint  Patient presents with  . Follow-up    Pt states that her cough has improved- she did have double ear infection and URI and was txed with zithromax approx 1 wk  ago.   not using demerol at all. Delsym maybe once a day Thinks neurontin making her dizzy, a bit less on 300 bid than tid rec Please see patient coordinator before you leave today  to schedule sinus ct Change neurontin 100 mg to 4 x daily as per calendar   12/04/2012 f/u ov/Wert re: chronic cough/ much better on neurontin Chief Complaint  Patient presents with  . Follow-up    Cough has improved since her last visit. She c/o rhinitis for the past 2-3 wks. She has not had to use demerol, but occ uses the delsym for the cough.   Drippy nose in am, watery anterior, no triggers, never in pm or sleeping (p chlortrimeton) -  cough controlled to her satisfaction with min delsym needed and never demerol   rec For drippy nose take zyrtec 10 mg each am as per med calendar   01/15/2013 f/u ov/Wert re:  Cough worse x 3 weeks worse before supper / no med calendar Chief Complaint  Patient presents with  . Follow-up    Pt c/o increased cough for the past 3 wks- occ produces minimal yellow sputum.  She also c/o rhinitis and heaviness in her chest. She is taking delsym at least once per day but has not used demerol.  no cough at all while sleeping or waking her in am, min actual sputum production Gaining wt on neurontin and wants to try off.  rec Delsym 2 tsp every 12 hours and when you feel urge to cough, use the flutter valve as possible  When you can spend 3 straight days affording to be sleepy and not driving , then use the demerol to completely eliminate even the urge to clear your throat. Try off neurontin and resume it if cough  flares > worse so restarted, never took demerol   02/12/2013 f/u ov/Wert re:  Cough x 3 y, best rx was neurontin Chief Complaint  Patient presents with  . Follow-up    Cough is some better- she did have some mild hemoptysis 1 wk ago and has had nose bleeds for the past wk.    Not limited by breathing from desired activities   Nose bleeds on asa  Sense of pnds no better on zyrtec No obvious day to day or daytime variabilty or assoc sob or cp or chest tightness, subjective wheeze overt   hb symptoms. No unusual exp hx or h/o childhood pna/ asthma or knowledge of premature birth. >> Increase chlortrimeton to four times a day  For cough Delsym 2 tsp every 12 hours and when you feel urge to cough, use the flutter valve as much as possible  When you can spend 3 straight days affording to be sleepy and not driving , then use the demerol to completely eliminate even the urge to clear your throat while on Prednisone 10 mg take  4 each am x 2 days,   2 each am x 2 days,  1 each am x 2 days and stop    03/25/13 Follow up and Med Review  Patient returns for a followup and  medication review. We  reviewed all her medications. Organized them into a medication with patient education. Last visit. She was given a prednisone taper. And instructed increase her chlor tabs to 4 times daily. However, she misunderstood the instructions, and did not increase her chlor tabs .  Still having lingering cough with occasional clear/white mucus, wheezing/dyspnea/tightness during coughing spells Denies any hemoptysis, orthopnea, PND, or discolored  mucus. No fever Complains of a lot of throat clearing and post nasal drip. rec May take Sudafed for sinus pressure -check with eye doctor to make okay d/t recent cataract surgery.  Take Dymista 1 puff Twice daily  Until sample gone.  Increase Chlortabs 4mg  Four times a day   Add Tessalon Three times a day  For cough.     05/28/2013 f/u ov/Wert re: cough x 3 y Chief Complaint  Patient presents with  . Acute Visit    Pt c/o increased cough x 2 wks- non prod cough.  She has c/o runny nose.   still on neurontin 100 qid but no med calendar Cough is dry, day > night and never eliminated even on demerol 100 q 4 Nose running despite 1st gen h1 qid day >> night  rec Prednisone 10 mg take  4 each am x 2 days,   2 each am x 2 days,  1 each am x 2 days and stop   schedule methacholine challenge test 06/03/13 > provoked cough with nonspecific reductions in flow    06/17/2013 f/u ov/Wert re: chronic cough, no flutter, no candy  Chief Complaint  Patient presents with  . Follow-up    Pt states that her cough seems worse since had MCT. Cough is non prod and "constant".  Cough is 24/7 was provoked by the MCT which was equivocal by strict criteria neurontin count doesn't add up (still lots left in bottle of 120 filled 04/08/13) - admits forgets to take it but seems to help. Still mostly dry, Not limited by breathing from desired activities    No obvious day to day or daytime variabilty or assoc cp or chest tightness, subjective wheeze overt sinus or  hb symptoms. No unusual exp hx or h/o childhood pna/ asthma or knowledge of premature birth.  Sleeping ok without nocturnal  or early am exacerbation  of respiratory  c/o's or need for noct saba. Also denies any obvious fluctuation of symptoms with weather or environmental changes or other aggravating or alleviating factors except as outlined above   Current Medications, Allergies, Complete Past Medical History, Past Surgical History, Family History, and Social History were reviewed in [employer] record.  ROS  The following are not active complaints unless bolded sore throat, dysphagia, dental problems, itching, sneezing,  nasal congestion or excess/ purulent secretions, ear ache,   fever, chills, sweats, unintended wt loss, pleuritic or exertional cp, hemoptysis,  orthopnea pnd or leg swelling, presyncope, palpitations, heartburn, abdominal pain, anorexia, nausea, vomiting, diarrhea  or change in bowel or urinary habits, change in stools or urine, dysuria,hematuria,  rash, arthralgias, visual complaints, headache, numbness weakness or ataxia or problems with walking or coordination,  change in mood/affect or memory.                       Objective:   Physical Exam   Min  hoarse amb wf nad with freq throat clearing and no candy handy/ no flutter   09/02/2012        144 >142 10/02/2012 > 10/23/2012  146 > 12/04/2012  146  > 02/12/2013  148 >144 03/26/2013 > 144  05/28/2013 > 06/17/2013   146    HEENT: nl dentition, turbinates, and orophanx which is pristine. Nl external ear canals without cough reflex   NECK :  without JVD/Nodes/TM/ nl carotid upstrokes bilaterally   LUNGS: no acc muscle use, clear to A and P bilaterally  CV:  RRR  no s3 or murmur or increase in P2, no edema   ABD:  soft and nontender with nl excursion in the supine position. No bruits or organomegaly, bowel sounds nl  MS:  warm without deformities, calf tenderness, cyanosis or  clubbing  SKIN: warm and dry without lesions        CXR  02/12/2013 : Cardiomediastinal silhouette is stable. No acute infiltrate or pleural effusion. No pulmonary edema. Mild degenerative changes thoracic spine. Mild hyperinflation.      Sinus ct 10/23/2012 Clear paranasal sinuses.      Assessment & Plan:

## 2013-06-17 NOTE — Patient Instructions (Addendum)
Dulera 100 Take 2 puffs first thing in am and then another 2 puffs about 12 hours later - don't start until a few days into the elimination of the severe cough  Work on inhaler technique:  relax and gently blow all the way out then take a nice smooth deep breath back in, triggering the inhaler at same time you start breathing in.  Hold for up to 5 seconds if you can.  Rinse and gargle with water when done  When ready to take the time to eliminate the cough  Start Prednisone 10 mg take  4 each am x 2 days,   2 each am x 2 days,  1 each am x 2 days and stop   Take delsym two tsp every 12 hours and supplement if needed with Demerol 50 mg up to 2 every 4 hours to suppress the urge to cough. Swallowing water or using ice chips/non mint and menthol containing candies (such as lifesavers or sugarless jolly ranchers) are also effective.  You should rest your voice and avoid activities that you know make you cough.  Once you have eliminated the cough for 3 straight days try reducing the demerol  first,  then the delsym as tolerated.    Always keep the flutter valve on hand to prevent trauma from your coughing   Please schedule a follow up office visit in 2 weeks, sooner if needed with all meds in hand  Late add dulera x one sample only

## 2013-06-18 DIAGNOSIS — J45991 Cough variant asthma: Secondary | ICD-10-CM | POA: Insufficient documentation

## 2013-06-18 HISTORY — DX: Cough variant asthma: J45.991

## 2013-06-18 LAB — PULMONARY FUNCTION TEST
FEF 25-75 Post: 1.23 L/sec
FEF 25-75 Pre: 1.43 L/sec
FEF2575-%Change-Post: -14 %
FEF2575-%Pred-Post: 61 %
FEF2575-%Pred-Pre: 72 %
FEV1-%Change-Post: -3 %
FEV1-%Pred-Post: 86 %
FEV1-%Pred-Pre: 89 %
FEV1-Post: 1.96 L
FEV1-Pre: 2.04 L
FEV1FVC-%Change-Post: 0 %
FEV1FVC-%Pred-Pre: 96 %
FEV6-%Change-Post: -4 %
FEV6-%Pred-Post: 91 %
FEV6-%Pred-Pre: 95 %
FEV6-Post: 2.63 L
FEV6-Pre: 2.74 L
FEV6FVC-%Change-Post: 0 %
FEV6FVC-%Pred-Post: 104 %
FEV6FVC-%Pred-Pre: 103 %
FVC-%Change-Post: -4 %
FVC-%Pred-Post: 88 %
FVC-%Pred-Pre: 92 %
FVC-Post: 2.63 L
FVC-Pre: 2.75 L
Post FEV1/FVC ratio: 75 %
Post FEV6/FVC ratio: 100 %
Pre FEV1/FVC ratio: 74 %
Pre FEV6/FVC Ratio: 100 %

## 2013-06-18 NOTE — Assessment & Plan Note (Addendum)
-  [facility] eval around 2012 pos dust/ roach  Neg resp to singulair  - PFT's 02/2010 nl in effort dep portion of f/v loop, lung vol and dlcos also nl  - CT chest 05/27/12 wnl x small HH - 08/04/12   [facility] AT >  MM - informed 09/10/2012 had not received demerol rx on 09/02/12  - sinus ct 10/23/2012 >>> Clear paranasal sinuses. - responded to neurontin 12/04/2012  But pos wt gain so try off 01/15/2013  - added flutter valve 01/15/2013  - med calendar 03/25/13 > did not bring back to clinic 05/28/13 - Methacholine challenge test 06/03/13 > equivocal but clinically caused tightness and made her cough - 06/17/13 documented non-adherence to neurontin   Chronic cough is often simultaneously caused by more than one condition. A single cause has been found from 38 to 82% of the time, multiple causes from 18 to 62%. Multiply caused cough has been the result of three diseases up to 42% of the time.   I believe this is the case here - note the Methacholine triggered a worsening of the cough and has lasted ever since the 06/03/13 study should have worn off, suggesting what triggers the cough doesn't necessarily fuel it  Therefore rec max neurontin tolerable, f/u q 2 weeks using a trust but verify approach here to assure compliance before adding new rx

## 2013-06-18 NOTE — Assessment & Plan Note (Signed)
-   Methacholine challenge test 06/03/13 > equivocal but clinically caused tightness and made her cough - hfa 75% p coaching 06/17/13 > Trial of dulera 100 2bid started

## 2013-07-02 ENCOUNTER — Ambulatory Visit: Payer: Medicare Other | Admitting: Internal Medicine

## 2013-08-04 ENCOUNTER — Encounter: Payer: Self-pay | Admitting: Critical Care Medicine

## 2013-08-04 ENCOUNTER — Ambulatory Visit (INDEPENDENT_AMBULATORY_CARE_PROVIDER_SITE_OTHER): Payer: Medicare Other | Admitting: Critical Care Medicine

## 2013-08-04 VITALS — BP 124/60 | HR 71 | Temp 98.1°F | Ht 62.5 in | Wt 148.2 lb

## 2013-08-04 DIAGNOSIS — K219 Gastro-esophageal reflux disease without esophagitis: Secondary | ICD-10-CM

## 2013-08-04 DIAGNOSIS — R05 Cough: Secondary | ICD-10-CM

## 2013-08-04 DIAGNOSIS — R059 Cough, unspecified: Secondary | ICD-10-CM

## 2013-08-04 DIAGNOSIS — R058 Other specified cough: Secondary | ICD-10-CM

## 2013-08-04 DIAGNOSIS — J310 Chronic rhinitis: Secondary | ICD-10-CM

## 2013-08-04 MED ORDER — CHLORPHENIRAMINE MALEATE 4 MG PO TABS: ORAL_TABLET | ORAL | Status: DC

## 2013-08-04 MED ORDER — DEXTROMETHORPHAN POLISTIREX 30 MG/5ML PO LQCR: ORAL | Status: DC

## 2013-08-04 MED ORDER — FLUTICASONE PROPIONATE 50 MCG/ACT NA SUSP: 2.0000 | Freq: Two times a day (BID) | NASAL | Status: DC

## 2013-08-04 MED ORDER — PREDNISONE 10 MG PO TABS: ORAL_TABLET | ORAL | Status: DC

## 2013-08-04 MED ORDER — FAMOTIDINE 40 MG PO TABS: 40.0000 mg | ORAL_TABLET | Freq: Every day | ORAL | Status: DC

## 2013-08-04 NOTE — Patient Instructions (Addendum)
Follow cough protocol with Delsym Take prednisone 10mg  Take 4 for three days 3 for three days 2 for three days 1 for three days and stop Stop Dulera Increase chlorpheniramine 12mg  ( 3 - 4mg  tabs) at bedtime  Increase pepcid famotidine 40mg  at bedtime Stay on protonix Strict reflux diet Flonase two puff ea nostril twice daily Stop demerol Stop neurontin/gabapentin Stop meloxicam Return 6 weeks [place]

## 2013-08-04 NOTE — Progress Notes (Signed)
Subjective:    Patient ID: [name], female    DOB: 05-18-1945, 68 y.o.   MRN: [id number]  HPI 08/04/2013 Chief Complaint  Patient presents with  . Pulmonary Consult    Former pt. of [facility]-c/o cough worse x 1 wk.,occass. white or clear,sob after coughing,no wheezing, midchest tightness with cough,wakes up at hs,pnd  Not able to take gabapentin and fell with this. Pt is dizzy with this and forgetful.  Never helped the cough Pt gets nausea with tramadol, codeine is an issue  ? Sleep apnea can cause cough?  Pt had sleep apnea, cpap not tolerated. Pt is on dulera 100 , no real difference.   Pt does the candy drop in the mouth  Cough is dry.  Notes some tightness. Pt had HH removed. Antihistamines made no difference. Delsym does help Pt was on pred but ? If helps.  Previous workup as follows: [facility] eval around 2012 pos dust/ roach  Neg resp to singulair  - PFT's 02/2010 nl in effort dep portion of f/v loop, lung vol and dlcos also nl  - CT chest 05/27/12 wnl x small HH - 08/04/12   [facility] AT >  MM - informed 09/10/2012 had not received demerol rx on 09/02/12  - sinus ct 10/23/2012 >>> Clear paranasal sinuses. - responded to neurontin 12/04/2012  But pos wt gain so try off 01/15/2013  - added flutter valve 01/15/2013  - med calendar 03/25/13 > did not bring back to clinic 05/28/13 - Methacholine challenge test 06/03/13 > equivocal but clinically caused tightness and made her cough - 06/17/13 documented non-adherence to neurontin   Past Medical History  Diagnosis Date  . Melanoma   . Endometriosis   . OSA (obstructive sleep apnea)     does not tolerate CPAP   . Hyperlipidemia   . Hypertension   . Asthma   . Melanoma 2011    heel     Family History  Problem Relation Age of Onset  . Emphysema Father     smoked  . Asthma Mother   . Asthma Brother   . Heart disease Father   . Colon cancer Mother   . Breast cancer Sister   . Breast cancer Maternal Grandmother      History    Social History  . Marital Status: Married    Spouse Name: N/A    Number of Children: N/A  . Years of Education: N/A   Occupational History  . 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 Smoking status: Never Smoker   . Smokeless tobacco: Never Used  . Alcohol Use: No  . Drug Use: No  . Sexual Activity: Not on file   Other Topics Concern  . Not on file   Social History Narrative   Lives with husband   Retired     Allergies  Allergen Reactions  . Neurontin [Gabapentin] Other (See Comments)    Severe falls   . Codeine Nausea And Vomiting  . Tramadol     REACTION: GI discomfort with high doses  . Cyclobenzaprine Palpitations  . Sulfa Antibiotics Rash     Outpatient Prescriptions Prior to Visit  Medication Sig Dispense Refill  . acetaminophen (TYLENOL) 325 MG tablet Per bottle as needed for headache      . Lactobacillus (ACIDOPHILUS) CAPS capsule Take 1 capsule by mouth daily.      Marland Kitchen levothyroxine (SYNTHROID, LEVOTHROID) 88 MCG tablet  Take 88 mcg by mouth daily before breakfast.      . Magnesium 250 MG TABS Take 1 tablet by mouth. Take 1 tablet every other day      . pantoprazole (PROTONIX) 40 MG tablet TAKE 1 TABLET EVERY DAY BEFORE BREAKFAST  30 tablet  11  . Respiratory Therapy Supplies (FLUTTER) DEVI Use as directed  1 each  0  . chlorpheniramine (CHLOR-TRIMETON) 4 MG tablet Take 4 mg by mouth. Take 1 tablet by mouth daily      . dextromethorphan (DELSYM) 30 MG/5ML liquid 2 tsp every 12 hours as needed for cough      . gabapentin (NEURONTIN) 100 MG capsule Take 1 capsule (100 mg total) by mouth 4 (four) times daily.  120 capsule  2  . meloxicam (MOBIC) 15 MG tablet Take 15 mg by mouth daily as needed.       . vitamin B-12 (CYANOCOBALAMIN) 1000 MCG tablet Take 1,000 mcg by mouth daily.      . famotidine (PEPCID) 20 MG tablet Take 20 mg by mouth at bedtime.       . meperidine (DEMEROL) 50 MG tablet 1-2 every 4 hours as needed  for pain or cough  30 tablet  0  . predniSONE (DELTASONE) 10 MG tablet Take  4 each am x 2 days,   2 each am x 2 days,  1 each am x 2 days and stop  14 tablet  0   No facility-administered medications prior to visit.     Review of Systems Constitutional:   No  weight loss, night sweats,  Fevers, chills, fatigue, lassitude. HEENT:   No headaches,  ++Difficulty swallowing,  Tooth/dental problems,  Sore throat,                No sneezing, itching, ear ache, nasal congestion, ++post nasal drip,   CV:  No chest pain,  Orthopnea, PND, swelling in lower extremities, anasarca, dizziness, palpitations  GI  Notes  Heartburn, notes indigestion, abdominal pain, nausea, vomiting, diarrhea, change in bowel habits, loss of appetite  Resp: Notes  shortness of breath with exertion not at rest.  No excess mucus, notes  productive cough,  Notes  non-productive cough,  No coughing up of blood.  No change in color of mucus.  Notes wheezing.  No chest wall deformity  Skin: no rash or lesions.  GU: no dysuria, change in color of urine, no urgency or frequency.  No flank pain.  MS:  No joint pain or swelling.  No decreased range of motion.  No back pain.  Psych:  No change in mood or affect. No depression or anxiety.  No memory loss.     Objective:   Physical Exam Filed Vitals:   08/04/13 1456  BP: 124/60  Pulse: 71  Temp: 98.1 F (36.7 C)  TempSrc: Oral  Height: 5' 2.5" (1.588 m)  Weight: 148 lb 3.2 oz (67.223 kg)  SpO2: 95%    Gen: Pleasant, well-nourished, in no distress,  normal affect  ENT: No lesions,  mouth clear,  oropharynx clear,  Significant rhinitis and postnasal drip  Neck: No JVD, no TMG, no carotid bruits  Lungs: No use of accessory muscles, no dullness to percussion, prominent pseudo-wheeze  Cardiovascular: RRR, heart sounds normal, no murmur or gallops, no peripheral edema  Abdomen: soft and NT, no HSM,  BS normal  Musculoskeletal: No deformities, no cyanosis or  clubbing  Neuro: alert, non focal  Skin: Warm, no lesions or rashes  No results found.  All prior studies reviewed as above in history present illness      Assessment & Plan:   Upper airway cough syndrome Upper airway cough syndrome previously seen by Dr. Melvyn Novas in [facility] office Extensive evaluation has already occurred. Review previous notes per Dr. Melvyn Novas At present the patient's cough is being driven by postnasal drip syndrome with severe rhinitis but no evidence of active overt infection. Reflux disease is also playing a significant role. I'm not at all convinced this patient has true reactive airways disease in the [facility] has not helped the cough and may even have worsened the cough despite a HFA technique Notes sinus CT scan has been performed and did not reveal sinusitis. Also this patient has had side effects from gabapentin and Demerol. She's had frequent falls and dizziness with the gabapentin. Also the gabapentin did not seem to have any benefit to the cough   Updated Medication List Outpatient Encounter Prescriptions as of 08/04/2013  Medication Sig  . acetaminophen (TYLENOL) 325 MG tablet Per bottle as needed for headache  . chlorpheniramine (CHLOR-TRIMETON) 4 MG tablet 12mg  at bedtime  . dextromethorphan (DELSYM) 30 MG/5ML liquid Take 2 tsp every 6  hours as needed for cough  . famotidine (PEPCID) 40 MG tablet Take 1 tablet (40 mg total) by mouth at bedtime.  . Lactobacillus (ACIDOPHILUS) CAPS capsule Take 1 capsule by mouth daily.  Marland Kitchen levothyroxine (SYNTHROID, LEVOTHROID) 88 MCG tablet Take 88 mcg by mouth daily before breakfast.  . Magnesium 250 MG TABS Take 1 tablet by mouth. Take 1 tablet every other day  . pantoprazole (PROTONIX) 40 MG tablet TAKE 1 TABLET EVERY DAY BEFORE BREAKFAST  . promethazine (PHENERGAN) 12.5 MG tablet Take 12.5 mg by mouth every 6 (six) hours as needed for nausea or vomiting.  Marland Kitchen Respiratory Therapy Supplies (FLUTTER) DEVI Use as directed    . [DISCONTINUED] chlorpheniramine (CHLOR-TRIMETON) 4 MG tablet Take 4 mg by mouth. Take 1 tablet by mouth daily  . [DISCONTINUED] dextromethorphan (DELSYM) 30 MG/5ML liquid 2 tsp every 12 hours as needed for cough  . [DISCONTINUED] famotidine (PEPCID) 10 MG tablet Take 10 mg by mouth at bedtime.  . [DISCONTINUED] gabapentin (NEURONTIN) 100 MG capsule Take 1 capsule (100 mg total) by mouth 4 (four) times daily.  . [DISCONTINUED] gabapentin (NEURONTIN) 100 MG capsule Take 100 mg by mouth 2 (two) times daily.  . [DISCONTINUED] meloxicam (MOBIC) 15 MG tablet Take 15 mg by mouth daily as needed.   . fluticasone (FLONASE) 50 MCG/ACT nasal spray Place 2 sprays into both nostrils 2 (two) times daily.  . predniSONE (DELTASONE) 10 MG tablet Take 4 for three days 3 for three days 2 for three days 1 for three days and stop  . vitamin B-12 (CYANOCOBALAMIN) 1000 MCG tablet Take 1,000 mcg by mouth daily.  . [DISCONTINUED] famotidine (PEPCID) 20 MG tablet Take 20 mg by mouth at bedtime.   . [DISCONTINUED] meperidine (DEMEROL) 50 MG tablet 1-2 every 4 hours as needed for pain or cough  . [DISCONTINUED] mometasone-formoterol (DULERA) 100-5 MCG/ACT AERO Inhale 2 puffs into the lungs. Two times a day  . [DISCONTINUED] predniSONE (DELTASONE) 10 MG tablet Take  4 each am x 2 days,   2 each am x 2 days,  1 each am x 2 days and stop

## 2013-08-04 NOTE — Progress Notes (Deleted)
Subjective:    Patient ID: [name], female    DOB: 1945-10-30, 68 y.o.   MRN: [id number]  HPI  Subjective:    Patient ID: [name], female    DOB: Jan 23, 1945  MRN: [id number]    Brief patient profile:  88 yowf never smoker pt of Dr Bea Graff with some ear infections as child assoc with passive smoke exp from father and subsequent problems with strep throats and tonsils removed age 24 then since around 2008 more freq"bronchitis" referred 08/04/2012 to pulmonary clinic by Dr Tilman Neat (her surgeon) with persistent cough since 2012.   HPI 08/04/2012 1st pulmonary eval cc daily x 2 years  worse after 4pm gradually worse until bedtime then some better usually dry or min prod white mucus assoc with hoarseness much better on prednisone.  rx with qvar and singulair by [facility] > allergy to dust, roach imp was GERD > rec was for NF Feb 2014  Then it resolved p Surgery and recurred over the next sev months. Cough occurs on breathing in and is quite violent.  L post cp x 2 years no different with cough.   CT chest and abd at [facility] > "nl" rec Cyclical cough regimen  08/18/2012 f/u ov/Wert re cough Chief Complaint  Patient presents with  . Follow-up    pt reports cough remains, states it is a little better than before but still present, was unable to tolerate tramadol-- c/o pain under left rib that extends from back to front when lying down   Cough better for only first 2 days while on tramadol but then developed intolerance and cough reverted to baseline... Remains dry, daytime rec neurontin 100 tid   09/02/2012 f/u ov/Wert re cough Chief Complaint  Patient presents with  . Cough    Cough is slightly improved. Still present but not as bad.  rec Plan A= automatic = Pantoprazole 40 mg Take 30-60 min before first meal of the day and famotidine 20 mg and chlortrimeton 4 mg at 4 pm and at bedtime Increase  neurontin to 300 mg three times a day Plan B= Backup = delsym 2 tsp every 12  hours Plan C = Demerol 50 mg up to one every 4 hours x 3 days max and no driving  Please schedule a follow up office visit in 2 weeks, sooner if needed    09/16/2012 f/u ov/Wert re chronic cough Chief Complaint  Patient presents with  . Follow-up    Pt reports cough returned x 1 weeks ago, also c/o sore throat and tongue pain from sucking on candy-- will start demerol  today -- denies any other concerns at this time   >pred pack and cyclical cough regimen  10/02/2012 Follow up for cough and Med review   new med calendar - pt brought all meds with her today.  reports cough is "much better", with basically no cough the past 2 days We reviewed all her medications and organized them into a medication calendar with patient education Appears patient is taking her medications correctly. Patient feels that her cough is much improved. She has not coughed at all for 2 days. Patient does complain that Neurontin seems to be making her somewhat off balance at times.  rec Decrease Neurontin 300mg  Twice daily   Follow med calendar closely and bring to each visit.  10/23/2012 f/u ov/Wert re: chronic cough x 2 years- did not bring med calendar  Chief Complaint  Patient presents with  . Follow-up  Pt states that her cough has improved- she did have double ear infection and URI and was txed with zithromax approx 1 wk ago.   not using demerol at all. Delsym maybe once a day Thinks neurontin making her dizzy, a bit less on 300 bid than tid rec Please see patient coordinator before you leave today  to schedule sinus ct Change neurontin 100 mg to 4 x daily as per calendar   12/04/2012 f/u ov/Wert re: chronic cough/ much better on neurontin Chief Complaint  Patient presents with  . Follow-up    Cough has improved since her last visit. She c/o rhinitis for the past 2-3 wks. She has not had to use demerol, but occ uses the delsym for the cough.   Drippy nose in am, watery anterior, no triggers, never in pm  or sleeping (p chlortrimeton) -  cough controlled to her satisfaction with min delsym needed and never demerol  rec For drippy nose take zyrtec 10 mg each am as per med calendar   01/15/2013 f/u ov/Wert re:  Cough worse x 3 weeks worse before supper / no med calendar Chief Complaint  Patient presents with  . Follow-up    Pt c/o increased cough for the past 3 wks- occ produces minimal yellow sputum.  She also c/o rhinitis and heaviness in her chest. She is taking delsym at least once per day but has not used demerol.  no cough at all while sleeping or waking her in am, min actual sputum production Gaining wt on neurontin and wants to try off.  rec Delsym 2 tsp every 12 hours and when you feel urge to cough, use the flutter valve as possible  When you can spend 3 straight days affording to be sleepy and not driving , then use the demerol to completely eliminate even the urge to clear your throat. Try off neurontin and resume it if cough  flares > worse so restarted, never took demerol   02/12/2013 f/u ov/Wert re:  Cough x 3 y, best rx was neurontin Chief Complaint  Patient presents with  . Follow-up    Cough is some better- she did have some mild hemoptysis 1 wk ago and has had nose bleeds for the past wk.    Not limited by breathing from desired activities   Nose bleeds on asa  Sense of pnds no better on zyrtec No obvious day to day or daytime variabilty or assoc sob or cp or chest tightness, subjective wheeze overt   hb symptoms. No unusual exp hx or h/o childhood pna/ asthma or knowledge of premature birth. >> Increase chlortrimeton to four times a day  For cough Delsym 2 tsp every 12 hours and when you feel urge to cough, use the flutter valve as much as possible  When you can spend 3 straight days affording to be sleepy and not driving , then use the demerol to completely eliminate even the urge to clear your throat while on Prednisone 10 mg take  4 each am x 2 days,   2 each am x 2  days,  1 each am x 2 days and stop    03/25/13 Follow up and Med Review  Patient returns for a followup and medication review. We  reviewed all her medications. Organized them into a medication with patient education. Last visit. She was given a prednisone taper. And instructed increase her chlor tabs to 4 times daily. However, she misunderstood the instructions, and did not increase her  chlor tabs .  Still having lingering cough with occasional clear/white mucus, wheezing/dyspnea/tightness during coughing spells Denies any hemoptysis, orthopnea, PND, or discolored mucus. No fever Complains of a lot of throat clearing and post nasal drip. rec May take Sudafed for sinus pressure -check with eye doctor to make okay d/t recent cataract surgery.  Take Dymista 1 puff Twice daily  Until sample gone.  Increase Chlortabs 4mg  Four times a day   Add Tessalon Three times a day  For cough.     05/28/2013 f/u ov/Wert re: cough x 3 y Chief Complaint  Patient presents with  . Acute Visit    Pt c/o increased cough x 2 wks- non prod cough.  She has c/o runny nose.   still on neurontin 100 qid but no med calendar Cough is dry, day > night and never eliminated even on demerol 100 q 4 Nose running despite 1st gen h1 qid day >> night  rec Prednisone 10 mg take  4 each am x 2 days,   2 each am x 2 days,  1 each am x 2 days and stop   schedule methacholine challenge test 06/03/13 > provoked cough with nonspecific reductions in flow    06/2013  Cough is 24/7 was provoked by the MCT which was equivocal by strict criteria neurontin count doesn't add up (still lots left in bottle of 120 filled 04/08/13) - admits forgets to take it but seems to help. Still mostly dry, Not limited by breathing from desired activities    No obvious day to day or daytime variabilty or assoc cp or chest tightness, subjective wheeze overt sinus or hb symptoms. No unusual exp hx or h/o childhood pna/ asthma or knowledge of premature  birth.  Sleeping ok without nocturnal  or early am exacerbation  of respiratory  c/o's or need for noct saba. Also denies any obvious fluctuation of symptoms with weather or environmental changes or other aggravating or alleviating factors except as outlined above   08/04/2013 Chief Complaint  Patient presents with  . Pulmonary Consult    Former pt. of [facility]-c/o cough worse x 1 wk.,occass. white or clear,sob after coughing,no wheezing, midchest tightness with cough,wakes up at hs,pnd  Not able to take gabapentin and fell with this. Pt is dizzy with this and forgetful.  Never helped the cough Pt gets nausea with tramadol, codeine is an issue  ? Sleep apnea can cause cough?  Pt had sleep apnea, cpap not tolerated. Pt is on dulera 100 , no real difference.   Pt does the candy drop in the mouth  Cough is dry.  Notes some tightness. Pt had HH removed. Antihistamines made no difference. Delsym does help Pt was on pred but ? If helps.   Current Medications, Allergies, Complete Past Medical History, Past Surgical History, Family History, and Social History were reviewed in [employer] record.  ROS  The following are not active complaints unless bolded sore throat, dysphagia, dental problems, itching, sneezing,  nasal congestion or excess/ purulent secretions, ear ache,   fever, chills, sweats, unintended wt loss, pleuritic or exertional cp, hemoptysis,  orthopnea pnd or leg swelling, presyncope, palpitations, heartburn, abdominal pain, anorexia, nausea, vomiting, diarrhea  or change in bowel or urinary habits, change in stools or urine, dysuria,hematuria,  rash, arthralgias, visual complaints, headache, numbness weakness or ataxia or problems with walking or coordination,  change in mood/affect or memory.  Objective:   Physical Exam   Min  hoarse amb wf nad with freq throat clearing and no candy handy/ no flutter   09/02/2012        144 >142  10/02/2012 > 10/23/2012  146 > 12/04/2012  146  > 02/12/2013  148 >144 03/26/2013 > 144  05/28/2013 > 08/04/2013   146    HEENT: nl dentition, turbinates, and orophanx which is pristine. Nl external ear canals without cough reflex   NECK :  without JVD/Nodes/TM/ nl carotid upstrokes bilaterally   LUNGS: no acc muscle use, clear to A and P bilaterally     CV:  RRR  no s3 or murmur or increase in P2, no edema   ABD:  soft and nontender with nl excursion in the supine position. No bruits or organomegaly, bowel sounds nl  MS:  warm without deformities, calf tenderness, cyanosis or clubbing  SKIN: warm and dry without lesions        CXR  02/12/2013 : Cardiomediastinal silhouette is stable. No acute infiltrate or pleural effusion. No pulmonary edema. Mild degenerative changes thoracic spine. Mild hyperinflation.      Sinus ct 10/23/2012 Clear paranasal sinuses.      Assessment & Plan:     Review of Systems     Objective:   Physical Exam        Assessment & Plan:

## 2013-08-04 NOTE — Assessment & Plan Note (Signed)
Upper airway cough syndrome previously seen by Dr. Melvyn Novas in [facility] office Extensive evaluation has already occurred. Review previous notes per Dr. Melvyn Novas At present the patient's cough is being driven by postnasal drip syndrome with severe rhinitis but no evidence of active overt infection. Reflux disease is also playing a significant role. I'm not at all convinced this patient has true reactive airways disease in the [facility] has not helped the cough and may even have worsened the cough despite a HFA technique Notes sinus CT scan has been performed and did not reveal sinusitis. Also this patient has had side effects from gabapentin and Demerol. She's had frequent falls and dizziness with the gabapentin. Also the gabapentin did not seem to have any benefit to the cough

## 2013-08-18 ENCOUNTER — Other Ambulatory Visit: Payer: Self-pay | Admitting: Internal Medicine

## 2013-09-03 ENCOUNTER — Telehealth: Payer: Self-pay | Admitting: Critical Care Medicine

## 2013-09-03 NOTE — Telephone Encounter (Signed)
Pt seen 08/04/13 by Dr Joya Gaskins Advised to increase medications-- would like to know if she needs to stay on these dosages until she follows up. Had to reschedule her 6 week follow up with PW 09/08/13 Her new appt is 09/29/13   Patient Instructions     Follow cough protocol with Delsym  Take prednisone 10mg  Take 4 for three days 3 for three days 2 for three days 1 for three days and stop  Stop Dulera  Increase chlorpheniramine 12mg  ( 3 - 4mg  tabs) at bedtime  Increase pepcid famotidine 40mg  at bedtime  Stay on protonix  Strict reflux diet  Flonase two puff ea nostril twice daily  Stop demerol  Stop neurontin/gabapentin  Stop meloxicam  Return 6 weeks [place]   Please advise Dr Joya Gaskins. Thanks.

## 2013-09-04 NOTE — Telephone Encounter (Signed)
Returning call. [phone]

## 2013-09-04 NOTE — Telephone Encounter (Signed)
lmomtcb x1 

## 2013-09-04 NOTE — Telephone Encounter (Signed)
Yes

## 2013-09-04 NOTE — Telephone Encounter (Signed)
Called made pt aware. Nothing further needed 

## 2013-09-08 ENCOUNTER — Ambulatory Visit: Payer: Medicare Other | Admitting: Critical Care Medicine

## 2013-09-29 ENCOUNTER — Ambulatory Visit (INDEPENDENT_AMBULATORY_CARE_PROVIDER_SITE_OTHER): Payer: Medicare Other | Admitting: Critical Care Medicine

## 2013-09-29 ENCOUNTER — Encounter: Payer: Self-pay | Admitting: Critical Care Medicine

## 2013-09-29 VITALS — BP 128/64 | HR 98 | Temp 98.3°F | Ht 63.0 in | Wt 148.2 lb

## 2013-09-29 DIAGNOSIS — R05 Cough: Secondary | ICD-10-CM

## 2013-09-29 DIAGNOSIS — J0100 Acute maxillary sinusitis, unspecified: Secondary | ICD-10-CM

## 2013-09-29 DIAGNOSIS — R059 Cough, unspecified: Secondary | ICD-10-CM

## 2013-09-29 DIAGNOSIS — R058 Other specified cough: Secondary | ICD-10-CM

## 2013-09-29 MED ORDER — AZITHROMYCIN 250 MG PO TABS: ORAL_TABLET | ORAL | Status: DC

## 2013-09-29 MED ORDER — PREDNISONE 10 MG PO TABS: ORAL_TABLET | ORAL | Status: DC

## 2013-09-29 NOTE — Progress Notes (Signed)
Subjective:    Patient ID: [name], female    DOB: [birth year], 68 y.o.   MRN: [id number]  HPI 09/29/2013 Chief Complaint  Patient presents with  . Follow-up    c/o increased hoarseness, clearing throat a lot, not able to rest voice much due to company, sob-same,cough-yellow,no fcs,  chest  heavy ,diarrhea   F/u upper airway cyclical cough, no true asthma Pt was ok until 1.5 week ago: company ill, viral issues.   Now cough is productive clear to white.  occ yellow.  Notes pn drip severe. Throat is scratchy.  Using the drops. No f/c/s  Review of Systems Constitutional:   No  weight loss, night sweats,  Fevers, chills, ++fatigue, lassitude. HEENT:   No headaches,  Difficulty swallowing,  Tooth/dental problems,  +++Sore throat,                No sneezing, itching, ear ache, nasal congestion,+++ post nasal drip,   CV:  No chest pain,  Orthopnea, PND, swelling in lower extremities, anasarca, dizziness, palpitations  GI  No heartburn, indigestion, abdominal pain, nausea, vomiting, diarrhea, change in bowel habits, loss of appetite  Resp: No shortness of breath with exertion or at rest.  No excess mucus, notes  productive cough,  Notes non-productive cough,  No coughing up of blood.  No change in color of mucus.  No wheezing.  No chest wall deformity  Skin: no rash or lesions.  GU: no dysuria, change in color of urine, no urgency or frequency.  No flank pain.  MS:  No joint pain or swelling.  No decreased range of motion.  No back pain.  Psych:  No change in mood or affect. No depression or anxiety.  No memory loss.     Objective:   Physical Exam Filed Vitals:   09/29/13 1422  BP: 128/64  Pulse: 98  Temp: 98.3 F (36.8 C)  TempSrc: Oral  Height: 5\' 3"  (1.6 m)  Weight: 148 lb 3.2 oz (67.223 kg)  SpO2: 95%    Gen: Pleasant, well-nourished, in no distress,  normal affect  ENT: No lesions,  mouth clear,  oropharynx clear, prominent postnasal drip, right-sided purulence  in nare  Neck: No JVD, no TMG, no carotid bruits  Lungs: No use of accessory muscles, no dullness to percussion, prominent  Pseudo-wheeze  Cardiovascular: RRR, heart sounds normal, no murmur or gallops, no peripheral edema  Abdomen: soft and NT, no HSM,  BS normal  Musculoskeletal: No deformities, no cyanosis or clubbing  Neuro: alert, non focal  Skin: Warm, no lesions or rashes  No results found.      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 Now with acute right maxillary sinusitis with postnasal drip precipitating current cough paroxysms, note right external auditory canal occluded with cerumen Plan Need to clear her right ear of cerumen  Take azithromycin 250mg  Take two once then one daily until gone Take prednisone 10mg  Take 4 for two days three for two days two for two days one for two days Stay on flonase nasal spray Stay on chlor trimeton Use saline nasal spray three times daily Follow cough protocol with Delsym  Return 2 months    Updated Medication List Outpatient Encounter Prescriptions as of 09/29/2013  Medication Sig  . acetaminophen (TYLENOL) 325 MG tablet Per bottle as needed for headache  . chlorpheniramine (CHLOR-TRIMETON) 4 MG tablet 12mg  at bedtime  . dextromethorphan (DELSYM) 30 MG/5ML liquid  Take 2 tsp every 6  hours as needed for cough  . famotidine (PEPCID) 40 MG tablet Take 1 tablet (40 mg total) by mouth at bedtime.  . fluticasone (FLONASE) 50 MCG/ACT nasal spray Place 2 sprays into both nostrils 2 (two) times daily.  . Lactobacillus (ACIDOPHILUS) CAPS capsule Take 1 capsule by mouth daily.  Marland Kitchen levothyroxine (SYNTHROID, LEVOTHROID) 88 MCG tablet Take 88 mcg by mouth daily before breakfast.  . Magnesium 250 MG TABS Take 1 tablet by mouth. Take 1 tablet every other day  . pantoprazole (PROTONIX) 40 MG tablet TAKE 1 TABLET EVERY DAY BEFORE BREAKFAST  . promethazine (PHENERGAN) 12.5 MG  tablet Take 12.5 mg by mouth every 6 (six) hours as needed for nausea or vomiting.  Marland Kitchen Respiratory Therapy Supplies (FLUTTER) DEVI Use as directed  . azithromycin (ZITHROMAX) 250 MG tablet Take two once then one daily until gone  . predniSONE (DELTASONE) 10 MG tablet Take 4 for two days three for two days two for two days one for two days  . vitamin B-12 (CYANOCOBALAMIN) 1000 MCG tablet Take 1,000 mcg by mouth daily.  . [DISCONTINUED] predniSONE (DELTASONE) 10 MG tablet Take 4 for three days 3 for three days 2 for three days 1 for three days and stop

## 2013-09-29 NOTE — Patient Instructions (Signed)
Get your Right ear wax cleaned out Take azithromycin 250mg  Take two once then one daily until gone Take prednisone 10mg  Take 4 for two days three for two days two for two days one for two days Both above sent to pharmacy Stay on flonase nasal spray Stay on chlor trimeton Use saline nasal spray three times daily Follow cough protocol with Delsym  Return 2 months

## 2013-09-30 NOTE — Assessment & Plan Note (Signed)
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 Now with acute right maxillary sinusitis with postnasal drip precipitating current cough paroxysms, note right external auditory canal occluded with cerumen Plan Need to clear her right ear of cerumen  Take azithromycin 250mg  Take two once then one daily until gone Take prednisone 10mg  Take 4 for two days three for two days two for two days one for two days Stay on flonase nasal spray Stay on chlor trimeton Use saline nasal spray three times daily Follow cough protocol with Delsym  Return 2 months

## 2013-11-24 ENCOUNTER — Encounter: Payer: Self-pay | Admitting: Critical Care Medicine

## 2013-11-24 ENCOUNTER — Ambulatory Visit (INDEPENDENT_AMBULATORY_CARE_PROVIDER_SITE_OTHER): Payer: Medicare Other | Admitting: Critical Care Medicine

## 2013-11-24 VITALS — BP 152/66 | HR 76 | Temp 99.3°F | Ht 63.0 in | Wt 148.6 lb

## 2013-11-24 DIAGNOSIS — K219 Gastro-esophageal reflux disease without esophagitis: Secondary | ICD-10-CM

## 2013-11-24 DIAGNOSIS — K449 Diaphragmatic hernia without obstruction or gangrene: Secondary | ICD-10-CM

## 2013-11-24 DIAGNOSIS — J45991 Cough variant asthma: Secondary | ICD-10-CM

## 2013-11-24 HISTORY — DX: Diaphragmatic hernia without obstruction or gangrene: K44.9

## 2013-11-24 MED ORDER — CEFUROXIME AXETIL 500 MG PO TABS: 500.0000 mg | ORAL_TABLET | Freq: Two times a day (BID) | ORAL | Status: DC

## 2013-11-24 MED ORDER — BENZONATATE 100 MG PO CAPS: ORAL_CAPSULE | ORAL | Status: DC

## 2013-11-24 MED ORDER — PREDNISONE 10 MG PO TABS: ORAL_TABLET | ORAL | Status: DC

## 2013-11-24 NOTE — Progress Notes (Signed)
Subjective:    Patient ID: [name], female    DOB: 07/03/1945, 68 y.o.   MRN: [id number]  HPI  11/24/2013 Chief Complaint  Patient presents with  . 2 month follow up    Cough greatly improved while on cough protocol.  Notices cough worsens after decreasing delsym.  Cough prod over the past few days with white to yellow mucus.  No wheezing, chest tightnes, CP, or f/c/s.      Pt got better a while then got worse.  the patient is still on antihistamine and flonase.  Forgets the nasal spray.  Cannot get the delsym off. Got ears cleaned out.  Cough worse past two days yellow mucus.  No nasal d/c.  No postnasal drip. No GERD symptoms.  Pt with diarrhea for 1 year: goes 4-5x per day No wheezing, no dyspnea.   Review of Systems  Constitutional:   No  weight loss, night sweats,  Fevers, chills, ++fatigue, lassitude. HEENT:   No headaches,  Difficulty swallowing,  Tooth/dental problems,  +++Sore throat,                No sneezing, itching, ear ache, nasal congestion,+++ post nasal drip,   CV:  No chest pain,  Orthopnea, PND, swelling in lower extremities, anasarca, dizziness, palpitations  GI  No heartburn, indigestion, abdominal pain, nausea, vomiting, diarrhea, change in bowel habits, loss of appetite  Resp: No shortness of breath with exertion or at rest.  No excess mucus, notes  productive cough,  Notes non-productive cough,  No coughing up of blood.  No change in color of mucus.  No wheezing.  No chest wall deformity  Skin: no rash or lesions.  GU: no dysuria, change in color of urine, no urgency or frequency.  No flank pain.  MS:  No joint pain or swelling.  No decreased range of motion.  No back pain.  Psych:  No change in mood or affect. No depression or anxiety.  No memory loss.     Objective:   Physical Exam  Filed Vitals:   11/24/13 0918  BP: 152/66  Pulse: 76  Temp: 99.3 F (37.4 C)  TempSrc: Oral  Height: 5\' 3"  (1.6 m)  Weight: 148 lb 9.6 oz (67.405 kg)    SpO2: 96%    Gen: Pleasant, well-nourished, in no distress,  normal affect  ENT: No lesions,  mouth clear,  oropharynx clear, prominent postnasal drip,   Neck: No JVD, no TMG, no carotid bruits  Lungs: No use of accessory muscles, no dullness to percussion, prominent  Pseudo-wheeze  Cardiovascular: RRR, heart sounds normal, no murmur or gallops, no peripheral edema  Abdomen: soft and NT, no HSM,  BS normal  Musculoskeletal: No deformities, no cyanosis or clubbing  Neuro: alert, non focal  Skin: Warm, no lesions or rashes  No results found.      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 as a primary precipitating factor Note inhaled steroids have not previously been of much benefit Acute tracheobronchitis currently and sinusitis Plan Stop protonix due to diarrhea Increase acidophilus to twice daily Cefuroxime 500mg  twice daily x 5days Prednisone 10mg  Take 4 for two days three for two days two for two days one for two days Benzonatate 1-2 every 4-6 hours as needed for cough and delsym as needed : per cough protocol Stay on chlorpheniramine/fluticasone USe flutter valve 3 - 4 x daily Return 3 months  Updated Medication List Outpatient Encounter Prescriptions as of 11/24/2013  Medication Sig  . acetaminophen (TYLENOL) 325 MG tablet Per bottle as needed for headache  . chlorpheniramine (CHLOR-TRIMETON) 4 MG tablet 12mg  at bedtime  . Cholecalciferol (VITAMIN D) 2000 UNITS CAPS Take 1 capsule by mouth daily.  Marland Kitchen dextromethorphan (DELSYM) 30 MG/5ML liquid Take 2 tsp every 6  hours as needed for cough  . famotidine (PEPCID) 40 MG tablet Take 1 tablet (40 mg total) by mouth at bedtime.  . fluticasone (FLONASE) 50 MCG/ACT nasal spray Place 2 sprays into both nostrils 2 (two) times daily.  . Lactobacillus (ACIDOPHILUS) CAPS capsule Take 1 capsule by mouth daily.  Marland Kitchen levothyroxine (SYNTHROID,  LEVOTHROID) 88 MCG tablet Take 88 mcg by mouth daily before breakfast.  . Magnesium 250 MG TABS Take 1 tablet by mouth. Take 1 tablet every other day  . MELOXICAM PO Take by mouth as needed.  . promethazine (PHENERGAN) 12.5 MG tablet Take 12.5 mg by mouth every 6 (six) hours as needed for nausea or vomiting.  Marland Kitchen Respiratory Therapy Supplies (FLUTTER) DEVI Use as directed  . vitamin B-12 (CYANOCOBALAMIN) 1000 MCG tablet Take 1,000 mcg by mouth daily.  . [DISCONTINUED] pantoprazole (PROTONIX) 40 MG tablet TAKE 1 TABLET EVERY DAY BEFORE BREAKFAST  . benzonatate (TESSALON) 100 MG capsule Take 1-2 every 4-6 hours as needed for cough  . cefUROXime (CEFTIN) 500 MG tablet Take 1 tablet (500 mg total) by mouth 2 (two) times daily.  Marland Kitchen LORazepam (ATIVAN) 0.5 MG tablet Take 0.5 mg by mouth every 6 (six) hours as needed.  . predniSONE (DELTASONE) 10 MG tablet Take 4 for two days three for two days two for two days one for two days  . [DISCONTINUED] azithromycin (ZITHROMAX) 250 MG tablet Take two once then one daily until gone (Patient not taking: Reported on 11/24/2013)  . [DISCONTINUED] predniSONE (DELTASONE) 10 MG tablet Take 4 for two days three for two days two for two days one for two days (Patient not taking: Reported on 11/24/2013)

## 2013-11-24 NOTE — Patient Instructions (Signed)
Stop protonix due to diarrhea Increase acidophilus to twice daily Cefuroxime 500mg  twice daily x 5days Prednisone 10mg  Take 4 for two days three for two days two for two days one for two days Benzonatate 1-2 every 4-6 hours as needed for cough and delsym as needed : per cough protocol Stay on chlorpheniramine/fluticasone USe flutter valve 3 - 4 x daily Return 3 months

## 2013-11-25 NOTE — Assessment & Plan Note (Signed)
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

## 2013-12-18 ENCOUNTER — Ambulatory Visit: Payer: Medicare Other

## 2013-12-18 ENCOUNTER — Ambulatory Visit (INDEPENDENT_AMBULATORY_CARE_PROVIDER_SITE_OTHER): Payer: Medicare Other

## 2013-12-18 VITALS — BP 127/86 | HR 60 | Resp 12

## 2013-12-18 DIAGNOSIS — M21621 Bunionette of right foot: Secondary | ICD-10-CM

## 2013-12-18 DIAGNOSIS — R52 Pain, unspecified: Secondary | ICD-10-CM

## 2013-12-18 DIAGNOSIS — M2010 Hallux valgus (acquired), unspecified foot: Secondary | ICD-10-CM

## 2013-12-18 DIAGNOSIS — M778 Other enthesopathies, not elsewhere classified: Secondary | ICD-10-CM

## 2013-12-18 DIAGNOSIS — M7750 Other enthesopathy of unspecified foot: Secondary | ICD-10-CM

## 2013-12-18 DIAGNOSIS — M779 Enthesopathy, unspecified: Secondary | ICD-10-CM

## 2013-12-18 DIAGNOSIS — M205X1 Other deformities of toe(s) (acquired), right foot: Secondary | ICD-10-CM

## 2013-12-18 NOTE — Patient Instructions (Signed)
Bunionectomy A bunionectomy is surgery to remove a bunion. A bunion is an enlargement of the joint at the base of the big toe. It is made up of bone and soft tissue on the inside part of the joint. Over time, a painful lump appears on the inside of the joint. The big toe begins to point inward toward the second toe. New bone growth can occur and a bone spur may form. The pain eventually causes difficulty walking. A bunion usually results from inflammation caused by the irritation of poorly fitting shoes. It often begins later in life. A bunionectomy is performed when nonsurgical treatment no longer works. When surgery is needed, the extent of the procedure will depend on the degree of deformity of the foot. Your surgeon will discuss with you the different procedures and what will work best for you depending on your age and health. LET YOUR CAREGIVER KNOW ABOUT:   Previous problems with anesthetics or medicines used to numb the skin.  Allergies to dyes, iodine, foods, and/or latex.  Medicines taken including herbs, eye drops, prescription medicines (especially medicines used to "thin the blood"), aspirin and other over-the-counter medicines, and steroids (by mouth or as a cream).  History of bleeding or blood problems.  Possibility of pregnancy, if this applies.  History of blood clots in your legs and/or lungs .  Previous surgery.  Other important health problems. RISKS AND COMPLICATIONS   Infection.  Pain.  Nerve damage.  Possibility that the bunion will recur. BEFORE THE PROCEDURE  You should be present 60 minutes prior to your procedure or as directed.  PROCEDURE  Surgery is often done so that you can go home the same day (outpatient). It may be done in a hospital or in an outpatient surgical center. An anesthetic will be used to help you sleep during the procedure. Sometimes, a spinal anesthetic is used to make you numb below the waist. A cut (incision) is made over the swollen  area at the first joint of the big toe. The enlarged lump will be removed. If there is a need to reposition the bones of the big toe, this may require more than 1 incision. The bone itself may need to be cut. Screws and wires may be used in the repair. These can be removed at a later date. In severe cases, the entire joint may need to be removed and a joint replacement inserted. When done, the incision is closed with stitches (sutures). Skin adhesive strips may be added for reinforcement. They help hold the incision closed.  AFTER THE PROCEDURE  Compression bandages (dressings) are then wrapped around the wound. This helps to keep the foot in alignment and reduce swelling. Your foot will be monitored for bleeding and swelling. You will need to stay for a few hours in the recovery area before being discharged. This allows time for the anesthesia to wear off. You will be discharged home when you are awake, stable, and doing well. HOME CARE INSTRUCTIONS   You can expect to return to normal activities within 6 to 8 weeks after surgery. The foot is at increased risk for swelling for several months. When you can expect to bear weight on the operated foot will depend on the extent of your surgery. The milder the deformity, the less tissue is removed and the sooner the return to normal activity level. During the recovery period, a special shoe, boot, or cast may be worn to accommodate the surgical bandage and to help provide stability   to the foot.  Once you are home, an ice pack applied to the operative site may help with discomfort and keep swelling down. Stop using the ice if it causes discomfort.  Keep your feet raised (elevated) when possible to lessen swelling.  If you have an elastic bandage on your foot and you have numbness, tingling, or your foot becomes cold and blue, adjust the bandage to make it comfortable.  Change dressings as directed.  Keep the wound dry and clean. The wound may be washed  gently with soap and water. Gently blot dry without rubbing. Do not take baths or use swimming pools or hot tubs for 10 days, or as instructed by your caregiver.  Only take over-the-counter or prescription medicines for pain, discomfort, or fever as directed by your caregiver.  You may continue a normal diet as directed.  For activity, use crutches with no weight bearing or your orthopedic shoe as directed. Continue to use crutches or a cane as directed until you can stand without causing pain. SEEK MEDICAL CARE IF:   You have redness, swelling, bruising, or increasing pain in the wound.  There is pus coming from the wound.  You have drainage from a wound lasting longer than 1 day.  You have an oral temperature above 102 F (38.9 C).  You notice a bad smell coming from the wound or dressing.  The wound breaks open after sutures have been removed.  You develop dizzy episodes or fainting while standing.  You have persistent nausea or vomiting.  Your toes become cold.  Pain is not relieved with medicines. SEEK IMMEDIATE MEDICAL CARE IF:   You develop a rash.  You have difficulty breathing.  You develop any reaction or side effects to medicines given.  Your toes are numb or blue, or you have severe pain. MAKE SURE YOU:   Understand these instructions.  Will watch your condition.  Will get help right away if you are not doing well or get worse. Document Released: 12/01/2004 Document Revised: 03/12/2011 Document Reviewed: 01/06/2007 Exit[employer] Patient Information 2015 [employer]. This information is not intended to replace advice given to you by your health care provider. Make sure you discuss any questions you have with your health care provider.  

## 2013-12-18 NOTE — Progress Notes (Signed)
   Subjective:    Patient ID: [name], female    DOB: 12/05/1945, 68 y.o.   MRN: [id number]  HPI  PT STATED B/L FOOT ARE PAINFUL ESPECIALLY THE RT FOOT FOR 1 MONTH. FEET ARE GETTING WORSE AND GET AGGRAVATED WHEN WALKING. TRIED NO TREATMENT.  Review of Systems  Constitutional: Positive for fatigue.  Respiratory: Positive for cough.   Gastrointestinal: Positive for diarrhea.  Musculoskeletal: Positive for myalgias, back pain, joint swelling and gait problem.  Skin: Positive for color change.  Hematological: Bruises/bleeds easily.       Objective:   Physical Exam 68 year old female well-developed well-nourished oriented 3 presents this time with painful bunion deformity of both feet right more so than left has a bunion and also points to the fifth metatarsal area of the right foot occasionally having some shooting sensation this may be compensatory due to gait changes due to the bunion pain. Has limitation of motion first MTP as compared to the other digits right worse than left. 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 Also slight tailor bunion deformity although this may compensatory pain and capsulitis of the MTP fifth right left is a symptomatically on the fifth for does have some slight bunion pain on the MTP joint. First is otherwise intact pedal pulses are palpable DP and PT +2 over 4 Refill time 3 seconds epicritic and proprioceptive sensations intact and symmetric there is normal plantar response DTRs not listed neurologically skin color pigment normal hair growth absent patient scheduled for surgery January for her knee left knee she is to keep that appointment in suggest follow-up in 2 months for surgery consult literature on bunion correction is given at this time silicone bunion shields are applied to  cushion the bunion temporarily until surgery can be provided reappointed in one to 2 months for surgery consultation likely for [facility] bunionectomy next  Harriet Masson DPM       Assessment & Plan:

## 2014-01-19 ENCOUNTER — Encounter (HOSPITAL_COMMUNITY)
Admission: RE | Admit: 2014-01-19 | Discharge: 2014-01-19 | Disposition: A | Discharge status: Home / Self Care | Payer: Medicare Other | Source: Ambulatory Visit | Attending: Orthopedic Surgery | Admitting: Orthopedic Surgery

## 2014-01-19 ENCOUNTER — Encounter (HOSPITAL_COMMUNITY): Payer: Self-pay

## 2014-01-19 DIAGNOSIS — M179 Osteoarthritis of knee, unspecified: Secondary | ICD-10-CM | POA: Insufficient documentation

## 2014-01-19 DIAGNOSIS — Z01818 Encounter for other preprocedural examination: Secondary | ICD-10-CM | POA: Insufficient documentation

## 2014-01-19 HISTORY — DX: Other specified postprocedural states: Z98.890

## 2014-01-19 HISTORY — DX: Cough: R05

## 2014-01-19 HISTORY — DX: Gastro-esophageal reflux disease without esophagitis: K21.9

## 2014-01-19 HISTORY — DX: Diarrhea, unspecified: R19.7

## 2014-01-19 HISTORY — DX: Personal history of other diseases of the digestive system: Z87.19

## 2014-01-19 HISTORY — DX: Other specified postprocedural states: R11.2

## 2014-01-19 HISTORY — DX: Anxiety disorder, unspecified: F41.9

## 2014-01-19 HISTORY — DX: Unspecified atrial fibrillation: I48.91

## 2014-01-19 HISTORY — DX: Unspecified osteoarthritis, unspecified site: M19.90

## 2014-01-19 HISTORY — DX: Adverse effect of unspecified anesthetic, initial encounter: T41.45XA

## 2014-01-19 HISTORY — DX: Other complications of anesthesia, initial encounter: T88.59XA

## 2014-01-19 HISTORY — DX: Nausea with vomiting, unspecified: R11.2

## 2014-01-19 LAB — APTT: aPTT: 32 seconds (ref 24–37)

## 2014-01-19 LAB — SURGICAL PCR SCREEN
MRSA, PCR: NEGATIVE
Staphylococcus aureus: NEGATIVE

## 2014-01-19 LAB — CBC
HCT: 44.6 % (ref 36.0–46.0)
Hemoglobin: 14.7 g/dL (ref 12.0–15.0)
MCH: 30.7 pg (ref 26.0–34.0)
MCHC: 33 g/dL (ref 30.0–36.0)
MCV: 93.1 fL (ref 78.0–100.0)
Platelets: 252 10*3/uL (ref 150–400)
RBC: 4.79 MIL/uL (ref 3.87–5.11)
RDW: 13.2 % (ref 11.5–15.5)
WBC: 7.9 10*3/uL (ref 4.0–10.5)

## 2014-01-19 LAB — BASIC METABOLIC PANEL
Anion gap: 5 (ref 5–15)
BUN: 20 mg/dL (ref 6–23)
CO2: 27 mmol/L (ref 19–32)
Calcium: 9.7 mg/dL (ref 8.4–10.5)
Chloride: 110 mEq/L (ref 96–112)
Creatinine, Ser: 0.93 mg/dL (ref 0.50–1.10)
GFR calc Af Amer: 72 mL/min — ABNORMAL LOW (ref >90)
GFR calc non Af Amer: 62 mL/min — ABNORMAL LOW (ref >90)
Glucose, Bld: 90 mg/dL (ref 70–99)
Potassium: 4.1 mmol/L (ref 3.5–5.1)
Sodium: 142 mmol/L (ref 135–145)

## 2014-01-19 LAB — PROTIME-INR
INR: 1.05 (ref 0.00–1.49)
Prothrombin Time: 13.8 seconds (ref 11.6–15.2)

## 2014-01-19 NOTE — Progress Notes (Signed)
Pt. Cleared by Dr. Jimmie Molly for surgery, for cardiac.  Pt. Reports that she had 2 ekgs, sent a fax request for office visit note & ekgs to Dr. Jimmie Molly.  Pt. Followed by Dr. Joya Gaskins for chronic cough that she has had for 3.5 yrs.

## 2014-01-19 NOTE — Progress Notes (Signed)
Call to [facility] tech. To complete med. Profile.

## 2014-01-19 NOTE — Pre-Procedure Instructions (Signed)
[name]  01/19/2014   Your procedure is scheduled on:  Friday, January 29th  Report to [facility] Admitting at 12PM.  Call this number if you have problems the morning of surgery: [phone]   Remember:   Do not eat food or drink liquids after midnight.   Take these medicines the morning of surgery with A SIP OF WATER: Tylenol, thyroid medicine    Do not wear jewelry, make-up or nail polish.  Do not wear lotions, powders, or perfume, deodorant.  Do not shave 48 hours prior to surgery.  Do not bring valuables to the hospital.  [facility] is not responsible  for any belongings or valuables.               Contacts, dentures or bridgework may not be worn into surgery.  Leave suitcase in the car. After surgery it may be brought to your room.  For patients admitted to the hospital, discharge time is determined by your treatment team.           Please read over the following fact sheets that you were given: Pain Booklet, Coughing and Deep Breathing, Blood Transfusion Information, MRSA Information and Surgical Site Infection Prevention  [place] - Preparing for Surgery  Before surgery, you can play an important role.  Because skin is not sterile, your skin needs to be as free of germs as possible.  You can reduce the number of germs on you skin by washing with CHG (chlorahexidine gluconate) soap before surgery.  CHG is an antiseptic cleaner which kills germs and bonds with the skin to continue killing germs even after washing.  Please DO NOT use if you have an allergy to CHG or antibacterial soaps.  If your skin becomes reddened/irritated stop using the CHG and inform your nurse when you arrive at Short Stay.  Do not shave (including legs and underarms) for at least 48 hours prior to the first CHG shower.  You may shave your face.  Please follow these instructions carefully:   1.  Shower with CHG Soap the night before surgery and the morning of Surgery.  2.  If you  choose to wash your hair, wash your hair first as usual with your normal shampoo.  3.  After you shampoo, rinse your hair and body thoroughly to remove the shampoo.  4.  Use CHG as you would any other liquid soap.  You can apply CHG directly to the skin and wash gently with scrungie or a clean washcloth.  5.  Apply the CHG Soap to your body ONLY FROM THE NECK DOWN.  Do not use on open wounds or open sores.  Avoid contact with your eyes, ears, mouth and genitals (private parts).  Wash genitals (private parts) with your normal soap.  6.  Wash thoroughly, paying special attention to the area where your surgery will be performed.  7.  Thoroughly rinse your body with warm water from the neck down.  8.  DO NOT shower/wash with your normal soap after using and rinsing off the CHG Soap.  9.  Pat yourself dry with a clean towel.            10.  Wear clean pajamas.            11.  Place clean sheets on your bed the night of your first shower and do not sleep with pets.  Day of Surgery  Do not apply any lotions/deoderants the morning of surgery.  Please wear clean clothes to the hospital/surgery center.

## 2014-01-19 NOTE — H&P (Signed)
[name] is an 69 y.o. female.    Chief Complaint: left knee pain  HPI: Pt is a 69 y.o. female complaining of left knee pain for multiple years. Pain had continually increased since the beginning. X-rays in the clinic show end-stage arthritic changes of the left knee. Pt has tried various conservative treatments which have failed to alleviate their symptoms, including injections and therapy. Various options are discussed with the patient. Risks, benefits and expectations were discussed with the patient. Patient understand the risks, benefits and expectations and wishes to proceed with surgery.   PCP:  Gilford Rile, MD  D/C Plans:  Home with HHPT  PMH: Past Medical History  Diagnosis Date  . Melanoma   . Endometriosis   . OSA (obstructive sleep apnea)     does not tolerate CPAP   . Hyperlipidemia   . Hypertension   . Asthma   . Melanoma 2011    heel    PSH: Past Surgical History  Procedure Laterality Date  . Appendectomy    . Tonsillectomy    . Vesicovaginal fistula closure w/ tah    . Gallbladder surgery    . Hiatal hernia repair  2/14    Social History:  reports that she has never smoked. She has never used smokeless tobacco. She reports that she does not drink alcohol or use illicit drugs.  Allergies:  Allergies  Allergen Reactions  . Neurontin [Gabapentin] Other (See Comments)    Severe falls   . Codeine Nausea And Vomiting  . Tramadol     REACTION: GI discomfort with high doses  . Cyclobenzaprine Palpitations  . Sulfa Antibiotics Rash    Medications: No current facility-administered medications for this encounter.   Current Outpatient Prescriptions  Medication Sig Dispense Refill  . acetaminophen (TYLENOL) 325 MG tablet Per bottle as needed for headache    . benzonatate (TESSALON) 100 MG capsule Take 1-2 every 4-6 hours as needed for cough 90 capsule 4  . cefUROXime (CEFTIN) 500 MG tablet Take 1 tablet (500 mg total) by mouth 2 (two) times daily. 10  tablet 0  . chlorpheniramine (CHLOR-TRIMETON) 4 MG tablet 12mg  at bedtime 30 tablet   . Cholecalciferol (VITAMIN D) 2000 UNITS CAPS Take 1 capsule by mouth daily.    Marland Kitchen dextromethorphan (DELSYM) 30 MG/5ML liquid Take 2 tsp every 6  hours as needed for cough 89 mL   . famotidine (PEPCID) 40 MG tablet Take 1 tablet (40 mg total) by mouth at bedtime. 30 tablet 6  . fluticasone (FLONASE) 50 MCG/ACT nasal spray Place 2 sprays into both nostrils 2 (two) times daily. 16 g 4  . Lactobacillus (ACIDOPHILUS) CAPS capsule Take 1 capsule by mouth daily.    Marland Kitchen levothyroxine (SYNTHROID, LEVOTHROID) 88 MCG tablet Take 88 mcg by mouth daily before breakfast.    . LORazepam (ATIVAN) 0.5 MG tablet Take 0.5 mg by mouth every 6 (six) hours as needed.  0  . Magnesium 250 MG TABS Take 1 tablet by mouth. Take 1 tablet every other day    . MELOXICAM PO Take by mouth as needed.    . predniSONE (DELTASONE) 10 MG tablet Take 4 for two days three for two days two for two days one for two days 20 tablet 0  . promethazine (PHENERGAN) 12.5 MG tablet Take 12.5 mg by mouth every 6 (six) hours as needed for nausea or vomiting.    Marland Kitchen Respiratory Therapy Supplies (FLUTTER) DEVI Use as directed 1 each 0  . vitamin B-12 (  CYANOCOBALAMIN) 1000 MCG tablet Take 1,000 mcg by mouth daily.      No results found for this or any previous visit (from the past 48 hour(s)). No results found.  ROS: Pain with rom of the left lower extremity  Physical Exam:  Alert and oriented 69 y.o. female in no acute distress Cranial nerves 2-12 intact Cervical spine: full rom with no tenderness, nv intact distally Chest: active breath sounds bilaterally, no wheeze rhonchi or rales Heart: regular rate and rhythm, no murmur Abd: non tender non distended with active bowel sounds Hip is stable with rom  Left knee with medial and lateral joint line tenderness nv intact distally No rashes or edema Minimally antalgic gait Crepitus with rom of left  knee  Assessment/Plan Assessment: left knee end stage osteoarthritis  Plan: Patient will undergo a left total knee arthroplasty by Dr. Veverly Fells at [facility]. Risks benefits and expectations were discussed with the patient. Patient understand risks, benefits and expectations and wishes to proceed.

## 2014-01-20 ENCOUNTER — Encounter (HOSPITAL_COMMUNITY): Payer: Self-pay

## 2014-01-20 NOTE — Progress Notes (Signed)
Anesthesia Chart Review:  Patient is a 69 year old female scheduled for left TKA on 01/19/14 by Dr. Veverly Fells.  History includes non-smoker, post-operative N/V, HLD, melanoma (heel), cough variant asthma, OSA (does not tolerate CPAP), HTN (off meds since weight loss), anxiety, hypothyroidism, arthritis, GERD, hiatal hernia, hysterectomy. Afib is listed, but cardiology notes from 01/07/14 do not mention anything about afib--just poor r wave progression.   PCP is Dr. Bea Graff. Pulmonologist is Dr. Asencion Noble. Cardiologist is Dr. Jimmie Molly with [facility]) in [place]. She was referred to cardiology by her PCP due to abnormal EKG.  Dr. Jimmie Molly did not order any additional testing, and felt patient could "safely proceed with surgery."   Meds include ASA 81 mg, Pepcid, Flonase, levothyroxine, Ativan, promethazine.   01/07/14 EKG [facility]): SR, occasional ectopic ventricular beast, low voltage with rightward axis and rotation, consider pulmonary disease.   Echo 02/28/09 [facility]): Normal LV systolic function, EF 16%. Minimal MR.   02/12/13 CXR: No active cardiopulmonary disease. Mild hyperinflation.  - Methacholine challenge test 06/03/13 > equivocal but clinically caused tightness and made her cough. FVC 2.75 (92%), FEV1 2.04 (89%) - Alpha-1-antitrypsin studies "unremakrable" 08/04/12.   Preoperative labs noted.   She has cardiology clearance. If no acute cardiopulmonary issues then I would anticipate that she could proceed as planned.  George Hugh [facility] Short Stay Center/Anesthesiology Phone [phone] 01/20/2014 5:36 PM

## 2014-01-28 MED ORDER — CEFAZOLIN SODIUM-DEXTROSE 2-3 GM-% IV SOLR
2.0000 g | INTRAVENOUS | Status: AC
  Administered 2014-01-29: 2 g via INTRAVENOUS
  Filled 2014-01-28: qty 50

## 2014-01-29 ENCOUNTER — Inpatient Hospital Stay (HOSPITAL_COMMUNITY): Payer: Medicare Other

## 2014-01-29 ENCOUNTER — Encounter (HOSPITAL_COMMUNITY)
Admission: RE | Disposition: A | Discharge status: Home Health | Payer: Self-pay | Source: Ambulatory Visit | Attending: Orthopedic Surgery

## 2014-01-29 ENCOUNTER — Encounter (HOSPITAL_COMMUNITY): Payer: Self-pay | Admitting: *Deleted

## 2014-01-29 ENCOUNTER — Inpatient Hospital Stay (HOSPITAL_COMMUNITY): Payer: Medicare Other | Admitting: Anesthesiology

## 2014-01-29 ENCOUNTER — Inpatient Hospital Stay (HOSPITAL_COMMUNITY)
Admission: RE | Admit: 2014-01-29 | Discharge: 2014-02-01 | DRG: 470 | Disposition: A | Discharge status: Home Health | Payer: Medicare Other | Source: Ambulatory Visit | Attending: Orthopedic Surgery | Admitting: Orthopedic Surgery

## 2014-01-29 ENCOUNTER — Inpatient Hospital Stay (HOSPITAL_COMMUNITY): Payer: Medicare Other | Admitting: Vascular Surgery

## 2014-01-29 DIAGNOSIS — Z96659 Presence of unspecified artificial knee joint: Secondary | ICD-10-CM

## 2014-01-29 DIAGNOSIS — G4733 Obstructive sleep apnea (adult) (pediatric): Secondary | ICD-10-CM | POA: Diagnosis present

## 2014-01-29 DIAGNOSIS — M1712 Unilateral primary osteoarthritis, left knee: Principal | ICD-10-CM | POA: Diagnosis present

## 2014-01-29 DIAGNOSIS — Z85820 Personal history of malignant melanoma of skin: Secondary | ICD-10-CM | POA: Diagnosis not present

## 2014-01-29 DIAGNOSIS — Z79899 Other long term (current) drug therapy: Secondary | ICD-10-CM | POA: Diagnosis not present

## 2014-01-29 DIAGNOSIS — E785 Hyperlipidemia, unspecified: Secondary | ICD-10-CM | POA: Diagnosis present

## 2014-01-29 DIAGNOSIS — I10 Essential (primary) hypertension: Secondary | ICD-10-CM | POA: Diagnosis present

## 2014-01-29 DIAGNOSIS — M25562 Pain in left knee: Secondary | ICD-10-CM | POA: Diagnosis present

## 2014-01-29 DIAGNOSIS — J45909 Unspecified asthma, uncomplicated: Secondary | ICD-10-CM | POA: Diagnosis present

## 2014-01-29 DIAGNOSIS — T819XXA Unspecified complication of procedure, initial encounter: Secondary | ICD-10-CM

## 2014-01-29 HISTORY — PX: TOTAL KNEE ARTHROPLASTY: SHX125

## 2014-01-29 HISTORY — DX: Presence of unspecified artificial knee joint: Z96.659

## 2014-01-29 SURGERY — ARTHROPLASTY, KNEE, TOTAL
Anesthesia: Regional | Site: Knee | Laterality: Left

## 2014-01-29 MED ORDER — ARTIFICIAL TEARS OP OINT
TOPICAL_OINTMENT | OPHTHALMIC | Status: DC | PRN
  Administered 2014-01-29: 1 via OPHTHALMIC

## 2014-01-29 MED ORDER — HYDROMORPHONE HCL 1 MG/ML IJ SOLN
0.2500 mg | INTRAMUSCULAR | Status: DC | PRN
  Administered 2014-01-29 (×4): 0.5 mg via INTRAVENOUS

## 2014-01-29 MED ORDER — ACETAMINOPHEN 650 MG RE SUPP: 650.0000 mg | Freq: Four times a day (QID) | RECTAL | Status: DC | PRN

## 2014-01-29 MED ORDER — ONDANSETRON HCL 4 MG/2ML IJ SOLN
INTRAMUSCULAR | Status: AC
  Filled 2014-01-29: qty 2

## 2014-01-29 MED ORDER — LIDOCAINE HCL (CARDIAC) 20 MG/ML IV SOLN
INTRAVENOUS | Status: DC | PRN
  Administered 2014-01-29: 80 mg via INTRAVENOUS

## 2014-01-29 MED ORDER — ROCURONIUM BROMIDE 100 MG/10ML IV SOLN
INTRAVENOUS | Status: DC | PRN
  Administered 2014-01-29: 50 mg via INTRAVENOUS

## 2014-01-29 MED ORDER — HYDROMORPHONE HCL 1 MG/ML IJ SOLN
0.5000 mg | INTRAMUSCULAR | Status: DC | PRN
  Administered 2014-01-29 – 2014-01-31 (×3): 0.5 mg via INTRAVENOUS
  Filled 2014-01-29 (×3): qty 1

## 2014-01-29 MED ORDER — ONDANSETRON HCL 4 MG/2ML IJ SOLN
4.0000 mg | Freq: Four times a day (QID) | INTRAMUSCULAR | Status: DC | PRN
  Administered 2014-01-29 – 2014-02-01 (×4): 4 mg via INTRAVENOUS
  Filled 2014-01-29 (×4): qty 2

## 2014-01-29 MED ORDER — LIDOCAINE HCL (CARDIAC) 20 MG/ML IV SOLN
INTRAVENOUS | Status: AC
  Filled 2014-01-29: qty 5

## 2014-01-29 MED ORDER — GLYCOPYRROLATE 0.2 MG/ML IJ SOLN
INTRAMUSCULAR | Status: AC
  Filled 2014-01-29: qty 4

## 2014-01-29 MED ORDER — METOCLOPRAMIDE HCL 10 MG PO TABS
5.0000 mg | ORAL_TABLET | Freq: Three times a day (TID) | ORAL | Status: DC | PRN
  Administered 2014-01-30: 10 mg via ORAL
  Filled 2014-01-29: qty 1

## 2014-01-29 MED ORDER — 0.9 % SODIUM CHLORIDE (POUR BTL) OPTIME
TOPICAL | Status: DC | PRN
  Administered 2014-01-29: 1000 mL

## 2014-01-29 MED ORDER — MIDAZOLAM HCL 2 MG/2ML IJ SOLN
INTRAMUSCULAR | Status: AC
  Administered 2014-01-29: 2 mg
  Filled 2014-01-29: qty 2

## 2014-01-29 MED ORDER — MENTHOL 3 MG MT LOZG: 1.0000 | LOZENGE | OROMUCOSAL | Status: DC | PRN

## 2014-01-29 MED ORDER — SODIUM CHLORIDE 0.9 % IR SOLN
Status: DC | PRN
  Administered 2014-01-29: 3000 mL

## 2014-01-29 MED ORDER — SCOPOLAMINE 1 MG/3DAYS TD PT72
MEDICATED_PATCH | TRANSDERMAL | Status: AC
  Administered 2014-01-29: 1 via TRANSDERMAL
  Filled 2014-01-29: qty 1

## 2014-01-29 MED ORDER — ROCURONIUM BROMIDE 50 MG/5ML IV SOLN
INTRAVENOUS | Status: AC
  Filled 2014-01-29: qty 1

## 2014-01-29 MED ORDER — WARFARIN VIDEO: Freq: Once | Status: DC

## 2014-01-29 MED ORDER — HYDROMORPHONE HCL 1 MG/ML IJ SOLN
INTRAMUSCULAR | Status: AC
  Filled 2014-01-29: qty 2

## 2014-01-29 MED ORDER — PROPOFOL 10 MG/ML IV BOLUS
INTRAVENOUS | Status: AC
  Filled 2014-01-29: qty 20

## 2014-01-29 MED ORDER — FENTANYL CITRATE 0.05 MG/ML IJ SOLN
INTRAMUSCULAR | Status: AC
  Administered 2014-01-29: 100 ug
  Filled 2014-01-29: qty 2

## 2014-01-29 MED ORDER — WARFARIN - PHARMACIST DOSING INPATIENT
Freq: Every day | Status: DC
  Administered 2014-01-30 – 2014-01-31 (×2)

## 2014-01-29 MED ORDER — PHENOL 1.4 % MT LIQD: 1.0000 | OROMUCOSAL | Status: DC | PRN

## 2014-01-29 MED ORDER — POLYETHYLENE GLYCOL 3350 17 G PO PACK
17.0000 g | PACK | Freq: Every day | ORAL | Status: DC | PRN
  Administered 2014-02-01: 17 g via ORAL
  Filled 2014-01-29: qty 1

## 2014-01-29 MED ORDER — FENTANYL CITRATE 0.05 MG/ML IJ SOLN
INTRAMUSCULAR | Status: AC
  Filled 2014-01-29: qty 5

## 2014-01-29 MED ORDER — FENTANYL CITRATE 0.05 MG/ML IJ SOLN
INTRAMUSCULAR | Status: DC | PRN
  Administered 2014-01-29: 100 ug via INTRAVENOUS
  Administered 2014-01-29: 50 ug via INTRAVENOUS
  Administered 2014-01-29: 100 ug via INTRAVENOUS
  Administered 2014-01-29 (×2): 50 ug via INTRAVENOUS

## 2014-01-29 MED ORDER — PROMETHAZINE HCL 25 MG/ML IJ SOLN
INTRAMUSCULAR | Status: AC
  Filled 2014-01-29: qty 1

## 2014-01-29 MED ORDER — ACETAMINOPHEN 10 MG/ML IV SOLN
INTRAVENOUS | Status: DC | PRN
  Administered 2014-01-29: 1000 mg via INTRAVENOUS

## 2014-01-29 MED ORDER — METHOCARBAMOL 500 MG PO TABS: 500.0000 mg | ORAL_TABLET | Freq: Three times a day (TID) | ORAL | Status: DC | PRN

## 2014-01-29 MED ORDER — CEFAZOLIN SODIUM-DEXTROSE 2-3 GM-% IV SOLR
2.0000 g | Freq: Four times a day (QID) | INTRAVENOUS | Status: AC
  Administered 2014-01-29 – 2014-01-30 (×2): 2 g via INTRAVENOUS
  Filled 2014-01-29 (×2): qty 50

## 2014-01-29 MED ORDER — PROPOFOL 10 MG/ML IV BOLUS
INTRAVENOUS | Status: DC | PRN
  Administered 2014-01-29: 125 mg via INTRAVENOUS

## 2014-01-29 MED ORDER — CHLORHEXIDINE GLUCONATE 4 % EX LIQD
60.0000 mL | Freq: Once | CUTANEOUS | Status: DC
  Filled 2014-01-29: qty 60

## 2014-01-29 MED ORDER — ONDANSETRON HCL 4 MG PO TABS
4.0000 mg | ORAL_TABLET | Freq: Four times a day (QID) | ORAL | Status: DC | PRN
  Administered 2014-01-30 – 2014-01-31 (×4): 4 mg via ORAL
  Filled 2014-01-29 (×6): qty 1

## 2014-01-29 MED ORDER — BUPIVACAINE-EPINEPHRINE (PF) 0.5% -1:200000 IJ SOLN
INTRAMUSCULAR | Status: DC | PRN
  Administered 2014-01-29: 30 mL via PERINEURAL

## 2014-01-29 MED ORDER — LACTATED RINGERS IV SOLN
INTRAVENOUS | Status: DC
  Administered 2014-01-29: 50 mL/h via INTRAVENOUS

## 2014-01-29 MED ORDER — LACTATED RINGERS IV SOLN
INTRAVENOUS | Status: DC | PRN
  Administered 2014-01-29 (×2): via INTRAVENOUS

## 2014-01-29 MED ORDER — ONDANSETRON HCL 4 MG/2ML IJ SOLN
4.0000 mg | Freq: Four times a day (QID) | INTRAMUSCULAR | Status: AC | PRN
  Administered 2014-01-29: 4 mg via INTRAVENOUS

## 2014-01-29 MED ORDER — ACETAMINOPHEN 10 MG/ML IV SOLN
INTRAVENOUS | Status: AC
  Filled 2014-01-29: qty 100

## 2014-01-29 MED ORDER — BISACODYL 10 MG RE SUPP: 10.0000 mg | Freq: Every day | RECTAL | Status: DC | PRN

## 2014-01-29 MED ORDER — PATIENT'S GUIDE TO USING COUMADIN BOOK
Freq: Once | Status: DC
  Filled 2014-01-29: qty 1

## 2014-01-29 MED ORDER — METHOCARBAMOL 500 MG PO TABS
500.0000 mg | ORAL_TABLET | Freq: Four times a day (QID) | ORAL | Status: DC | PRN
  Administered 2014-01-29 – 2014-02-01 (×8): 500 mg via ORAL
  Filled 2014-01-29 (×8): qty 1

## 2014-01-29 MED ORDER — HYDROCODONE-ACETAMINOPHEN 5-325 MG PO TABS
1.0000 | ORAL_TABLET | ORAL | Status: DC | PRN
  Administered 2014-01-30 (×2): 1 via ORAL
  Administered 2014-01-30: 2 via ORAL
  Administered 2014-01-30 (×2): 1 via ORAL
  Administered 2014-01-30: 2 via ORAL
  Administered 2014-01-31 (×2): 1 via ORAL
  Administered 2014-01-31 – 2014-02-01 (×4): 2 via ORAL
  Filled 2014-01-29: qty 1
  Filled 2014-01-29 (×2): qty 2
  Filled 2014-01-29 (×5): qty 1
  Filled 2014-01-29 (×4): qty 2

## 2014-01-29 MED ORDER — WARFARIN SODIUM 5 MG PO TABS
5.0000 mg | ORAL_TABLET | Freq: Once | ORAL | Status: AC
  Administered 2014-01-29: 5 mg via ORAL
  Filled 2014-01-29: qty 1

## 2014-01-29 MED ORDER — ACETAMINOPHEN 325 MG PO TABS
650.0000 mg | ORAL_TABLET | Freq: Four times a day (QID) | ORAL | Status: DC | PRN
  Administered 2014-01-31: 650 mg via ORAL
  Filled 2014-01-29: qty 2

## 2014-01-29 MED ORDER — METOCLOPRAMIDE HCL 5 MG/ML IJ SOLN: 5.0000 mg | Freq: Three times a day (TID) | INTRAMUSCULAR | Status: DC | PRN

## 2014-01-29 MED ORDER — NEOSTIGMINE METHYLSULFATE 10 MG/10ML IV SOLN
INTRAVENOUS | Status: AC
  Filled 2014-01-29: qty 1

## 2014-01-29 MED ORDER — FERROUS SULFATE 325 (65 FE) MG PO TABS
325.0000 mg | ORAL_TABLET | Freq: Three times a day (TID) | ORAL | Status: DC
  Administered 2014-01-30 – 2014-02-01 (×7): 325 mg via ORAL
  Filled 2014-01-29 (×11): qty 1

## 2014-01-29 MED ORDER — METHOCARBAMOL 1000 MG/10ML IJ SOLN
500.0000 mg | Freq: Four times a day (QID) | INTRAVENOUS | Status: DC | PRN
  Filled 2014-01-29: qty 5

## 2014-01-29 MED ORDER — SODIUM CHLORIDE 0.9 % IV SOLN
INTRAVENOUS | Status: DC
  Administered 2014-01-29 – 2014-01-30 (×2): via INTRAVENOUS

## 2014-01-29 MED ORDER — HYDROCODONE-ACETAMINOPHEN 5-325 MG PO TABS
1.0000 | ORAL_TABLET | Freq: Four times a day (QID) | ORAL | Status: DC | PRN
Start: 2014-01-29 — End: 2017-07-11

## 2014-01-29 MED ORDER — PROMETHAZINE HCL 25 MG/ML IJ SOLN
6.2500 mg | Freq: Four times a day (QID) | INTRAMUSCULAR | Status: DC | PRN
  Administered 2014-01-29: 6.25 mg via INTRAVENOUS
  Filled 2014-01-29: qty 1

## 2014-01-29 MED ORDER — DEXAMETHASONE SODIUM PHOSPHATE 10 MG/ML IJ SOLN
INTRAMUSCULAR | Status: DC | PRN
  Administered 2014-01-29: 10 mg via INTRAVENOUS

## 2014-01-29 SURGICAL SUPPLY — 71 items
BANDAGE ESMARK 6X9 LF (GAUZE/BANDAGES/DRESSINGS) ×1 IMPLANT
BIT DRILL 2.9 CANN QC NONSTRL (BIT) ×2 IMPLANT
BLADE SAG 18X100X1.27 (BLADE) ×2 IMPLANT
BLADE SAW SGTL 13.0X1.19X90.0M (BLADE) ×2 IMPLANT
BNDG ELASTIC 6X10 VLCR STRL LF (GAUZE/BANDAGES/DRESSINGS) ×2 IMPLANT
BNDG ESMARK 6X9 LF (GAUZE/BANDAGES/DRESSINGS) ×2
BNDG GAUZE ELAST 4 BULKY (GAUZE/BANDAGES/DRESSINGS) ×2 IMPLANT
BOWL SMART MIX CTS (DISPOSABLE) ×2 IMPLANT
CAP KNEE TOTAL 3 SIGMA ×2 IMPLANT
CEMENT HV SMART SET (Cement) ×4 IMPLANT
CLSR STERI-STRIP ANTIMIC 1/2X4 (GAUZE/BANDAGES/DRESSINGS) ×2 IMPLANT
COVER SURGICAL LIGHT HANDLE (MISCELLANEOUS) ×2 IMPLANT
CUFF TOURNIQUET SINGLE 34IN LL (TOURNIQUET CUFF) ×2 IMPLANT
DRAPE C-ARM 42X72 X-RAY (DRAPES) ×2 IMPLANT
DRAPE EXTREMITY T 121X128X90 (DRAPE) ×2 IMPLANT
DRAPE IMP U-DRAPE 54X76 (DRAPES) ×2 IMPLANT
DRAPE PROXIMA HALF (DRAPES) ×2 IMPLANT
DRAPE U-SHAPE 47X51 STRL (DRAPES) ×2 IMPLANT
DRSG ADAPTIC 3X8 NADH LF (GAUZE/BANDAGES/DRESSINGS) ×2 IMPLANT
DRSG PAD ABDOMINAL 8X10 ST (GAUZE/BANDAGES/DRESSINGS) ×2 IMPLANT
DURAPREP 26ML APPLICATOR (WOUND CARE) ×2 IMPLANT
ELECT CAUTERY BLADE 6.4 (BLADE) ×2 IMPLANT
ELECT REM PT RETURN 9FT ADLT (ELECTROSURGICAL) ×2
ELECTRODE REM PT RTRN 9FT ADLT (ELECTROSURGICAL) ×1 IMPLANT
GAUZE SPONGE 4X4 12PLY STRL (GAUZE/BANDAGES/DRESSINGS) ×2 IMPLANT
GLOVE BIO SURGEON STRL SZ 6.5 (GLOVE) ×2 IMPLANT
GLOVE BIOGEL PI IND STRL 6.5 (GLOVE) ×1 IMPLANT
GLOVE BIOGEL PI IND STRL 7.0 (GLOVE) ×1 IMPLANT
GLOVE BIOGEL PI INDICATOR 6.5 (GLOVE) ×1
GLOVE BIOGEL PI INDICATOR 7.0 (GLOVE) ×1
GLOVE BIOGEL PI ORTHO PRO 7.5 (GLOVE) ×1
GLOVE BIOGEL PI ORTHO PRO SZ8 (GLOVE) ×1
GLOVE ECLIPSE 7.0 STRL STRAW (GLOVE) ×2 IMPLANT
GLOVE ORTHO TXT STRL SZ7.5 (GLOVE) ×2 IMPLANT
GLOVE PI ORTHO PRO STRL 7.5 (GLOVE) ×1 IMPLANT
GLOVE PI ORTHO PRO STRL SZ8 (GLOVE) ×1 IMPLANT
GLOVE SURG ORTHO 8.5 STRL (GLOVE) ×2 IMPLANT
GOWN STRL REUS W/ TWL LRG LVL3 (GOWN DISPOSABLE) ×2 IMPLANT
GOWN STRL REUS W/ TWL XL LVL3 (GOWN DISPOSABLE) ×3 IMPLANT
GOWN STRL REUS W/TWL LRG LVL3 (GOWN DISPOSABLE) ×2
GOWN STRL REUS W/TWL XL LVL3 (GOWN DISPOSABLE) ×3
HANDPIECE INTERPULSE COAX TIP (DISPOSABLE) ×1
IMMOBILIZER KNEE 22 (SOFTGOODS) ×2 IMPLANT
IMMOBILIZER KNEE 22 UNIV (SOFTGOODS) ×2 IMPLANT
KIT BASIN OR (CUSTOM PROCEDURE TRAY) ×2 IMPLANT
KIT MANIFOLD (MISCELLANEOUS) ×2 IMPLANT
KIT ROOM TURNOVER OR (KITS) ×2 IMPLANT
MANIFOLD NEPTUNE II (INSTRUMENTS) ×2 IMPLANT
NS IRRIG 1000ML POUR BTL (IV SOLUTION) ×2 IMPLANT
PACK TOTAL JOINT (CUSTOM PROCEDURE TRAY) ×2 IMPLANT
PACK UNIVERSAL I (CUSTOM PROCEDURE TRAY) ×2 IMPLANT
PAD ARMBOARD 7.5X6 YLW CONV (MISCELLANEOUS) ×4 IMPLANT
SCREW LAG CANC STD HEAD 4.0 70 (Screw) ×2 IMPLANT
SET HNDPC FAN SPRY TIP SCT (DISPOSABLE) ×1 IMPLANT
SPONGE GAUZE 4X4 12PLY STER LF (GAUZE/BANDAGES/DRESSINGS) ×2 IMPLANT
STRIP CLOSURE SKIN 1/2X4 (GAUZE/BANDAGES/DRESSINGS) ×4 IMPLANT
SUCTION FRAZIER TIP 10 FR DISP (SUCTIONS) ×2 IMPLANT
SUT MNCRL AB 3-0 PS2 18 (SUTURE) ×2 IMPLANT
SUT VIC AB 0 CT1 27 (SUTURE) ×2
SUT VIC AB 0 CT1 27XBRD ANBCTR (SUTURE) ×2 IMPLANT
SUT VIC AB 1 CT1 27 (SUTURE) ×3
SUT VIC AB 1 CT1 27XBRD ANBCTR (SUTURE) ×3 IMPLANT
SUT VIC AB 2-0 CT1 27 (SUTURE) ×2
SUT VIC AB 2-0 CT1 TAPERPNT 27 (SUTURE) ×2 IMPLANT
TOWEL OR 17X24 6PK STRL BLUE (TOWEL DISPOSABLE) ×2 IMPLANT
TOWEL OR 17X26 10 PK STRL BLUE (TOWEL DISPOSABLE) ×2 IMPLANT
TRAY FOLEY CATH 16FRSI W/METER (SET/KITS/TRAYS/PACK) ×2 IMPLANT
WASHER FLAT ACE (Orthopedic Implant) ×1 IMPLANT
WASHER PLAIN FLAT ACE NS 3PK (Orthopedic Implant) ×1 IMPLANT
WATER STERILE IRR 1000ML POUR (IV SOLUTION) IMPLANT
WIRE K 1.6MM 144256 (MISCELLANEOUS) ×4 IMPLANT

## 2014-01-29 NOTE — Transfer of Care (Signed)
Immediate Anesthesia Transfer of Care Note  Patient: [name]  Procedure(s) Performed: Procedure(s): LEFT TOTAL KNEE ARTHROPLASTY MCL REPAIR (Left)  Patient Location: PACU  Anesthesia Type:General  Level of Consciousness: awake, alert  and oriented  Airway & Oxygen Therapy: Patient Spontanous Breathing and Patient connected to nasal cannula oxygen  Post-op Assessment: Report given to RN, Post -op Vital signs reviewed and stable and Patient moving all extremities X 4  Post vital signs: Reviewed and stable  Last Vitals:  Filed Vitals:   01/29/14 1315  BP:   Pulse: 77  Temp:   Resp: 26    Complications: No apparent anesthesia complications

## 2014-01-29 NOTE — Anesthesia Postprocedure Evaluation (Signed)
Anesthesia Post Note  Patient: [name]  Procedure(s) Performed: Procedure(s) (LRB): LEFT TOTAL KNEE ARTHROPLASTY MCL REPAIR (Left)  Anesthesia type: general  Patient location: PACU  Post pain: Pain level controlled  Post assessment: Patient's Cardiovascular Status Stable  Last Vitals:  Filed Vitals:   01/29/14 1745  BP: 139/74  Pulse: 65  Temp:   Resp: 12    Post vital signs: Reviewed and stable  Level of consciousness: sedated  Complications: No apparent anesthesia complications

## 2014-01-29 NOTE — Anesthesia Procedure Notes (Addendum)
Anesthesia Regional Block:  Femoral nerve block  Pre-Anesthetic Checklist: ,, timeout performed, Correct Patient, Correct Site, Correct Laterality, Correct Procedure,, site marked, risks and benefits discussed, Surgical consent,  Pre-op evaluation,  At surgeon's request and post-op pain management  Laterality: Left  Prep: chloraprep       Needles:  Injection technique: Single-shot  Needle Type: Echogenic Stimulator Needle     Needle Length: 9cm 9 cm Needle Gauge: 21 and 21 G    Additional Needles:  Procedures: nerve stimulator Femoral nerve block  Nerve Stimulator or Paresthesia:  Response: Quadriceps muscle contraction, 0.45 mA,   Additional Responses:   Narrative:  Start time: 01/29/2014 12:42 PM End time: 01/29/2014 12:54 PM Injection made incrementally with aspirations every 5 mL.  Performed by: Personally  Anesthesiologist: HODIERNE, ADAM  Additional Notes: Functioning IV was confirmed and monitors were applied.  A 67mm 21ga Arrow echogenic stimulator needle was used. Sterile prep and drape,hand hygiene and sterile gloves were used.  Negative aspiration and negative test dose prior to incremental administration of local anesthetic. The patient tolerated the procedure well.     Procedure Name: Intubation Date/Time: 01/29/2014 2:10 PM Performed by: Neldon Newport Pre-anesthesia Checklist: Patient being monitored, Suction available, Emergency Drugs available, Patient identified and Timeout performed Patient Re-evaluated:Patient Re-evaluated prior to inductionOxygen Delivery Method: Circle system utilized Preoxygenation: Pre-oxygenation with 100% oxygen Intubation Type: IV induction Ventilation: Mask ventilation without difficulty Laryngoscope Size: Mac and 2 Grade View: Grade I Tube type: Oral Number of attempts: 1 Placement Confirmation: positive ETCO2,  ETT inserted through vocal cords under direct vision and breath sounds checked- equal and  bilateral Secured at: 21 cm Tube secured with: Tape Dental Injury: Teeth and Oropharynx as per pre-operative assessment

## 2014-01-29 NOTE — Brief Op Note (Signed)
01/29/2014  4:38 PM  PATIENT:  [name]  69 y.o. female  PRE-OPERATIVE DIAGNOSIS:  Left Knee Osteoarthritis,end stage  POST-OPERATIVE DIAGNOSIS:  Left Knee Osteoarthritis,MCL(med epicondyle) AVULSION  PROCEDURE:  Procedure(s): LEFT TOTAL KNEE ARTHROPLASTY MCL REPAIR (Left), ORIF medial epicondyle   SURGEON:  Surgeon(s) and Role:    * Augustin Schooling, MD - Primary  PHYSICIAN ASSISTANT:   ASSISTANTS: Ventura Bruns, PA-C   ANESTHESIA:   regional and general  EBL:  Total I/O In: 2000 [I.V.:2000] Out: 100 [Urine:100]  BLOOD ADMINISTERED:none  DRAINS: none   LOCAL MEDICATIONS USED:    NONE  SPECIMEN:  No Specimen  DISPOSITION OF SPECIMEN:  N/A  COUNTS:  YES  TOURNIQUET:   Total Tourniquet Time Documented: Thigh (Left) - 126 minutes Total: Thigh (Left) - 126 minutes   DICTATION: .Other Dictation: Dictation Number [id number]  PLAN OF CARE: Admit to inpatient   PATIENT DISPOSITION:  PACU - hemodynamically stable.   Delay start of Pharmacological VTE agent (>24hrs) due to surgical blood loss or risk of bleeding: no

## 2014-01-29 NOTE — Anesthesia Preprocedure Evaluation (Addendum)
Anesthesia Evaluation  Patient identified by MRN, date of birth, ID band Patient awake    Reviewed: Allergy & Precautions, NPO status , Patient's Chart, lab work & pertinent test results  History of Anesthesia Complications (+) PONV  Airway Mallampati: II   Neck ROM: full    Dental  (+) Teeth Intact, Dental Advidsory Given   Pulmonary asthma , sleep apnea ,          Cardiovascular hypertension,     Neuro/Psych Anxiety  Neuromuscular disease    GI/Hepatic hiatal hernia, GERD-  ,  Endo/Other    Renal/GU      Musculoskeletal  (+) Arthritis -,   Abdominal   Peds  Hematology   Anesthesia Other Findings Left front tooth in poor condition.  Notified patient of increased risk of tooth damage during airway manipulation.  Reproductive/Obstetrics                            Anesthesia Physical Anesthesia Plan  ASA: II  Anesthesia Plan: General and Regional   Post-op Pain Management: MAC Combined w/ Regional for Post-op pain   Induction: Intravenous  Airway Management Planned: Oral ETT  Additional Equipment:   Intra-op Plan:   Post-operative Plan: Extubation in OR  Informed Consent: I have reviewed the patients History and Physical, chart, labs and discussed the procedure including the risks, benefits and alternatives for the proposed anesthesia with the patient or authorized representative who has indicated his/her understanding and acceptance.   Dental Advisory Given  Plan Discussed with: CRNA, Anesthesiologist and Surgeon  Anesthesia Plan Comments:        Anesthesia Quick Evaluation

## 2014-01-29 NOTE — Progress Notes (Signed)
Orthopedic Tech Progress Note Patient Details:  [name] [birth year] [id number]  CPM Left Knee CPM Left Knee: On Left Knee Flexion (Degrees): 60 Left Knee Extension (Degrees): 0 Additional Comments: applied ohf, footsie roll  Ortho Devices Ortho Device/Splint Location: footsie roll Ortho Device/Splint Interventions: Criss Alvine 01/29/2014, 6:45 PM

## 2014-01-29 NOTE — Interval H&P Note (Signed)
History and Physical Interval Note:  01/29/2014 1:34 PM  [name]  has presented today for surgery, with the diagnosis of Left Knee Osteoarthritis  The various methods of treatment have been discussed with the patient and family. After consideration of risks, benefits and other options for treatment, the patient has consented to  Procedure(s): LEFT TOTAL KNEE ARTHROPLASTY (Left) as a surgical intervention .  The patient's history has been reviewed, patient examined, no change in status, stable for surgery.  I have reviewed the patient's chart and labs.  Questions were answered to the patient's satisfaction.     Derik Fults,STEVEN R

## 2014-01-29 NOTE — Progress Notes (Signed)
ANTICOAGULATION CONSULT NOTE - Initial Consult  Pharmacy Consult for coumadin Indication: VTE prophylaxis  Allergies  Allergen Reactions  . Neurontin [Gabapentin] Other (See Comments)    Severe falls   . Codeine Nausea And Vomiting  . Oxycodone Nausea And Vomiting  . Tramadol     REACTION: GI discomfort with high doses  . Cyclobenzaprine Palpitations  . Sulfa Antibiotics Rash    Patient Measurements: Height: 5\' 3"  (160 cm) Weight: 147 lb 11.2 oz (66.996 kg) IBW/kg (Calculated) : 52.4 Heparin Dosing Weight:   Vital Signs: Temp: 97.7 F (36.5 C) (01/29 1808) Temp Source: Oral (01/29 1148) BP: 140/60 mmHg (01/29 1808) Pulse Rate: 88 (01/29 1808)  Labs: No results for input(s): HGB, HCT, PLT, APTT, LABPROT, INR, HEPARINUNFRC, CREATININE, CKTOTAL, CKMB, TROPONINI in the last 72 hours.  Estimated Creatinine Clearance: 53.9 mL/min (by C-G formula based on Cr of 0.93).   Medical History: Past Medical History  Diagnosis Date  . Melanoma   . Endometriosis   . Hyperlipidemia   . Asthma   . 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 Cough     chronic, pt. reports for 3.5 yrs.   . Complication of anesthesia   . Atrial fibrillation     pt. reports that [facility] that she had a "slight afib.", no further test needed (01/07/14 cardiology note does not mention any afib, just poor anterior r wave progression)  . OSA (obstructive sleep apnea)     does not tolerate CPAP , pt. reports that its severe, but doesn't use it anymore, since weight loss.   Study done in [place], not sure where.   . Anxiety     antianxiety med. usually at night but can take during the day for anxiety  . GERD (gastroesophageal reflux disease)   . History of hiatal hernia     pt. has been told that the hiatal hernia has reappeared  . Arthritis     both knees & hands  also the back & neck  . Diarrhea     pt. reports that she has irritable bowel, loose stool & diarrhea both about 6 times per day, right after she eats.  .  Hypertension     pt. reports that she use to take antihtn med, but reports since weight loss she has been able to come off.    Marland Kitchen PONV (postoperative nausea and vomiting)     Medications:  Scheduled:  .  ceFAZolin (ANCEF) IV  2 g Intravenous Q6H  . ferrous sulfate  325 mg Oral TID PC  . HYDROmorphone      . ondansetron      . promethazine       Infusions:  . sodium chloride    . lactated ringers 50 mL/hr (01/29/14 1229)    Assessment: 69 yo who is s/p TKR. Coumadin has been ordered post op for DVT px. His baseline INR was normal on 1/19.   Goal of Therapy:  INR 2-3 Monitor platelets by anticoagulation protocol: Yes   Plan:   Coumadin 5mg  PO x1 Daily INR   Onnie Boer, PharmD Pager: [phone] 01/29/2014 6:33 PM

## 2014-01-30 LAB — BASIC METABOLIC PANEL
Anion gap: 7 (ref 5–15)
BUN: 9 mg/dL (ref 6–23)
CO2: 27 mmol/L (ref 19–32)
Calcium: 8.6 mg/dL (ref 8.4–10.5)
Chloride: 100 mmol/L (ref 96–112)
Creatinine, Ser: 0.8 mg/dL (ref 0.50–1.10)
GFR calc Af Amer: 86 mL/min — ABNORMAL LOW (ref >90)
GFR calc non Af Amer: 75 mL/min — ABNORMAL LOW (ref >90)
Glucose, Bld: 123 mg/dL — ABNORMAL HIGH (ref 70–99)
Potassium: 4.3 mmol/L (ref 3.5–5.1)
Sodium: 134 mmol/L — ABNORMAL LOW (ref 135–145)

## 2014-01-30 LAB — CBC
HCT: 37.2 % (ref 36.0–46.0)
Hemoglobin: 12.1 g/dL (ref 12.0–15.0)
MCH: 30 pg (ref 26.0–34.0)
MCHC: 32.5 g/dL (ref 30.0–36.0)
MCV: 92.3 fL (ref 78.0–100.0)
Platelets: 263 10*3/uL (ref 150–400)
RBC: 4.03 MIL/uL (ref 3.87–5.11)
RDW: 13 % (ref 11.5–15.5)
WBC: 11.8 10*3/uL — ABNORMAL HIGH (ref 4.0–10.5)

## 2014-01-30 LAB — PROTIME-INR
INR: 1.1 (ref 0.00–1.49)
Prothrombin Time: 14.4 seconds (ref 11.6–15.2)

## 2014-01-30 MED ORDER — DIPHENHYDRAMINE HCL 25 MG PO CAPS
25.0000 mg | ORAL_CAPSULE | Freq: Four times a day (QID) | ORAL | Status: DC | PRN
  Administered 2014-01-30: 25 mg via ORAL
  Filled 2014-01-30: qty 1

## 2014-01-30 MED ORDER — WARFARIN SODIUM 5 MG PO TABS
5.0000 mg | ORAL_TABLET | Freq: Once | ORAL | Status: AC
  Administered 2014-01-30: 5 mg via ORAL
  Filled 2014-01-30: qty 1

## 2014-01-30 MED ORDER — WARFARIN SODIUM 5 MG PO TABS: 5.0000 mg | ORAL_TABLET | Freq: Every day | ORAL | Status: DC

## 2014-01-30 MED ORDER — HYDROMORPHONE HCL 2 MG PO TABS: 1.0000 mg | ORAL_TABLET | ORAL | Status: DC | PRN

## 2014-01-30 MED ORDER — COUMADIN BOOK
Freq: Once | Status: AC
  Administered 2014-01-30: 15:00:00
  Filled 2014-01-30: qty 1

## 2014-01-30 NOTE — Progress Notes (Signed)
Physical Therapy Treatment Patient Details Name: [name] MRN: [id number] DOB: [birth year] Today's Date: 01/30/2014    History of Present Illness Pt is a 69 y.o. s/p Lt TKA and MCL repair.     PT Comments    Pt very motivated to progress with therapy. thera ex limited by pain. Patient needs to practice stairs next session.      Follow Up Recommendations  Home health PT;Supervision/Assistance - 24 hour     Equipment Recommendations  Rolling walker with 5" wheels;3in1 (PT)    Recommendations for Other Services       Precautions / Restrictions Precautions Precautions: Knee Precaution Booklet Issued: No Precaution Comments: reviewed no pillow or ice pack under knee Required Braces or Orthoses: Knee Immobilizer - Left Knee Immobilizer - Left: On when out of bed or walking Restrictions Weight Bearing Restrictions: Yes LLE Weight Bearing: Partial weight bearing LLE Partial Weight Bearing Percentage or Pounds: 50    Mobility  Bed Mobility Overal bed mobility: Needs Assistance Bed Mobility: Supine to Sit;Sit to Supine     Supine to sit: Min assist;HOB elevated Sit to supine: Min guard   General bed mobility comments: educated on using leg lift to (A) Lt LE to/off EOB  Transfers Overall transfer level: Needs assistance Equipment used: Rolling walker (2 wheeled) Transfers: Sit to/from Stand Sit to Stand: Supervision         General transfer comment: cues to control to seated position   Ambulation/Gait Ambulation/Gait assistance: Supervision Ambulation Distance (Feet): 100 Feet Assistive device: Rolling walker (2 wheeled) Gait Pattern/deviations: Step-to pattern;Decreased stance time - left;Decreased step length - right;Antalgic Gait velocity: decr Gait velocity interpretation: Below normal speed for age/gender General Gait Details: demo good understanding of PWB status; motivated to progress mobility; cues for RW safety    Stairs             Wheelchair Mobility    Modified Rankin (Stroke Patients Only)       Balance Overall balance assessment: Needs assistance Sitting-balance support: Feet supported;No upper extremity supported Sitting balance-Leahy Scale: Normal     Standing balance support: During functional activity;Bilateral upper extremity supported Standing balance-Leahy Scale: Poor Standing balance comment: RW                    Cognition Arousal/Alertness: Awake/alert Behavior During Therapy: WFL for tasks assessed/performed Overall Cognitive Status: Within Functional Limits for tasks assessed                      Exercises Total Joint Exercises Ankle Circles/Pumps: AROM;Both;10 reps Heel Slides: AAROM;Left;5 reps;Supine;Limitations Heel Slides Limitations: pain Long Arc Quad: AAROM;Left;5 reps;Limitations Long Arc Quad Limitations: pain    General Comments        Pertinent Vitals/Pain Pain Assessment: 0-10 Pain Score: 7  Pain Location: Lt knee Pain Descriptors / Indicators: Aching Pain Intervention(s): Monitored during session;Premedicated before session;Repositioned    Home Living Family/patient expects to be discharged to:: Private residence Living Arrangements: Spouse/significant other Available Help at Discharge: Family;Available 24 hours/day Type of Home: House Home Access: Stairs to enter Entrance Stairs-Rails: Right Home Layout: One level Home Equipment: Bedside commode      Prior Function Level of Independence: Independent          PT Goals (current goals can now be found in the care plan section) Acute Rehab PT Goals Patient Stated Goal: home tomorrow PT Goal Formulation: With patient Time For Goal Achievement: 02/06/14 Potential to Achieve Goals: Good Progress  towards PT goals: Progressing toward goals    Frequency  7X/week    PT Plan Current plan remains appropriate    Co-evaluation             End of Session Equipment Utilized During  Treatment: Gait belt;Left knee immobilizer Activity Tolerance: Patient tolerated treatment well Patient left: in bed;with call bell/phone within reach     Time: 1340-1354 PT Time Calculation (min) (ACUTE ONLY): 14 min  Charges:  $Gait Training: 8-22 mins                    G CodesGustavus Bryant, [place]  [phone] 01/30/2014, 3:47 PM

## 2014-01-30 NOTE — Progress Notes (Signed)
ANTICOAGULATION CONSULT NOTE - Follow Up Consult  Pharmacy Consult for Coumadin Indication: VTE prophylaxis  Allergies  Allergen Reactions  . Neurontin [Gabapentin] Other (See Comments)    Severe falls   . Codeine Nausea And Vomiting  . Oxycodone Nausea And Vomiting  . Tramadol     REACTION: GI discomfort with high doses  . Cyclobenzaprine Palpitations  . Sulfa Antibiotics Rash    Patient Measurements: Height: 5\' 3"  (160 cm) Weight: 147 lb 11.2 oz (66.996 kg) IBW/kg (Calculated) : 52.4 Heparin Dosing Weight:   Vital Signs: Temp: 98.2 F (36.8 C) (01/30 1349) Temp Source: Oral (01/30 1349) BP: 142/59 mmHg (01/30 1349) Pulse Rate: 102 (01/30 1349)  Labs:  Recent Labs  01/30/14 0455  HGB 12.1  HCT 37.2  PLT 263  LABPROT 14.4  INR 1.10  CREATININE 0.80    Estimated Creatinine Clearance: 62.7 mL/min (by C-G formula based on Cr of 0.8).   Medications:  Scheduled:  . ferrous sulfate  325 mg Oral TID PC  . patient's guide to using coumadin book   Does not apply Once  . warfarin   Does not apply Once  . Warfarin - Pharmacist Dosing Inpatient   Does not apply q1800    Assessment: 69yo female s/p TKR, on Coumadin for VTE px.  INR 1.1 this AM.  Hg and pltc are wnl.  No bleeding noted.  Goal of Therapy:  INR 2-3 Monitor platelets by anticoagulation protocol: Yes   Plan:  Repeat Coumadin 5mg  Daily INR  Gracy Bruins, PharmD Clinical Pharmacist [facility]

## 2014-01-30 NOTE — Discharge Instructions (Signed)
Ice to the knee at all times.  Do not prop behind the knee.  Prop under the ankle to encourage knee extension.  Do exercises every hour,  Heel slides, calf pumps, quad sets.  CPM 0-60 degrees 6 hours per day in 2 hour increments.  Increase 10 degree per day as tolerated up to 90 degrees.  Dangle knee over bed to get 90 degrees of bend  50% weight bearing on the left leg due to Medial Epicondyle avulsion and repair  Follow up in two weeks  [phone]   Information on my medicine - Coumadin   (Warfarin)  This medication education was reviewed with me or my healthcare representative as part of my discharge preparation.  The pharmacist that spoke with me during my hospital stay was:  Jaquita Folds, [facility]  Why was Coumadin prescribed for you? Coumadin was prescribed for you because you have a blood clot or a medical condition that can cause an increased risk of forming blood clots. Blood clots can cause serious health problems by blocking the flow of blood to the heart, lung, or brain. Coumadin can prevent harmful blood clots from forming. As a reminder your indication for Coumadin is:   Blood Clot Prevention After Orthopedic Surgery  What test will check on my response to Coumadin? While on Coumadin (warfarin) you will need to have an INR test regularly to ensure that your dose is keeping you in the desired range. The INR (international normalized ratio) number is calculated from the result of the laboratory test called prothrombin time (PT).  If an INR APPOINTMENT HAS NOT ALREADY BEEN MADE FOR YOU please schedule an appointment to have this lab work done by your health care provider within 7 days. Your INR goal is usually a number between:  2 to 3 or your provider may give you a more narrow range like 2-2.5.  Ask your health care provider during an office visit what your goal INR is.  What  do you need to  know  About  COUMADIN? Take Coumadin (warfarin) exactly as prescribed by your  healthcare provider about the same time each day.  DO NOT stop taking without talking to the doctor who prescribed the medication.  Stopping without other blood clot prevention medication to take the place of Coumadin may increase your risk of developing a new clot or stroke.  Get refills before you run out.  What do you do if you miss a dose? If you miss a dose, take it as soon as you remember on the same day then continue your regularly scheduled regimen the next day.  Do not take two doses of Coumadin at the same time.  Important Safety Information A possible side effect of Coumadin (Warfarin) is an increased risk of bleeding. You should call your healthcare provider right away if you experience any of the following: ? Bleeding from an injury or your nose that does not stop. ? Unusual colored urine (red or dark brown) or unusual colored stools (red or black). ? Unusual bruising for unknown reasons. ? A serious fall or if you hit your head (even if there is no bleeding).  Some foods or medicines interact with Coumadin (warfarin) and might alter your response to warfarin. To help avoid this: ? Eat a balanced diet, maintaining a consistent amount of Vitamin K. ? Notify your provider about major diet changes you plan to make. ? Avoid alcohol or limit your intake to 1 drink for women and 2 drinks  for men per day. (1 drink is 5 oz. wine, 12 oz. beer, or 1.5 oz. liquor.)  Make sure that ANY health care provider who prescribes medication for you knows that you are taking Coumadin (warfarin).  Also make sure the healthcare provider who is monitoring your Coumadin knows when you have started a new medication including herbals and non-prescription products.  Coumadin (Warfarin)  Major Drug Interactions  Increased Warfarin Effect Decreased Warfarin Effect  Alcohol (large quantities) Antibiotics (esp. Septra/Bactrim, Flagyl, Cipro) Amiodarone (Cordarone) Aspirin (ASA) Cimetidine  (Tagamet) Megestrol (Megace) NSAIDs (ibuprofen, naproxen, etc.) Piroxicam (Feldene) Propafenone (Rythmol SR) Propranolol (Inderal) Isoniazid (INH) Posaconazole (Noxafil) Barbiturates (Phenobarbital) Carbamazepine (Tegretol) Chlordiazepoxide (Librium) Cholestyramine (Questran) Griseofulvin Oral Contraceptives Rifampin Sucralfate (Carafate) Vitamin K   Coumadin (Warfarin) Major Herbal Interactions  Increased Warfarin Effect Decreased Warfarin Effect  Garlic Ginseng Ginkgo biloba Coenzyme Q10 Green tea St. Johns wort    Coumadin (Warfarin) FOOD Interactions  Eat a consistent number of servings per week of foods HIGH in Vitamin K (1 serving =  cup)  Collards (cooked, or boiled & drained) Kale (cooked, or boiled & drained) Mustard greens (cooked, or boiled & drained) Parsley *serving size only =  cup Spinach (cooked, or boiled & drained) Swiss chard (cooked, or boiled & drained) Turnip greens (cooked, or boiled & drained)  Eat a consistent number of servings per week of foods MEDIUM-HIGH in Vitamin K (1 serving = 1 cup)  Asparagus (cooked, or boiled & drained) Broccoli (cooked, boiled & drained, or raw & chopped) Brussel sprouts (cooked, or boiled & drained) *serving size only =  cup Lettuce, raw (green leaf, endive, romaine) Spinach, raw Turnip greens, raw & chopped   These websites have more information on Coumadin (warfarin):  FailFactory.se; VeganReport.com.au;

## 2014-01-30 NOTE — Op Note (Signed)
[name], [name] NO.:  [id number]  MEDICAL RECORD NO.:  [id number]  LOCATION:  5N30C                        FACILITY:  [facility]  PHYSICIAN:  Doran Heater. Veverly Fells, M.D. DATE OF BIRTH:  11-05-1945  DATE OF PROCEDURE:  01/29/2014 DATE OF DISCHARGE:                              OPERATIVE REPORT   PREOPERATIVE DIAGNOSIS:  Left knee end-stage osteoarthritis.  POSTOPERATIVE DIAGNOSIS:  Left knee end-stage osteoarthritis and medial epicondyle/medial collateral ligament avulsion.  PROCEDURE PERFORMED:  Left total knee replacement using DePuy Sigma rotating platform prosthesis and open reduction and internal fixation of displaced medial epicondyle fracture/medial collateral ligament avulsion.  ATTENDING SURGEON:  Doran Heater. Veverly Fells, M.D.  ASSISTANT:  Abbott Pao. Dixon, PA-C, who was scrubbed the entire procedure and necessary for satisfactory completion of surgery.  ANESTHESIA:  General anesthesia was used plus femoral block.  ESTIMATED BLOOD LOSS:  Minimal.  FLUID REPLACEMENT:  1200 mL of crystalloid.  INSTRUMENT COUNTS:  Correct.  COMPLICATIONS:  One complication was the medial epicondyle avulsion, which was repaired intraoperatively with a single 4.0 cannulated screw with a washer gaining anatomic reduction.  FLUID REPLACEMENT:  1200 mL of crystalloids.  ANTIBIOTICS:  Perioperative antibiotics were given.  INDICATIONS:  The patient is a 69 year old female with worsening left knee pain secondary to end-stage arthritis.  The patient has failed conservative management including injections, modification of activity, anti-inflammatories, and pain medications and presents for total knee arthroplasty to restore function and limit pain.  Informed consent obtained.  DESCRIPTION OF PROCEDURE:  After an adequate level of anesthesia was achieved, the patient was positioned supine on the operating room table. A nonsterile tourniquet placed on proximal thigh.  Left leg  sterilely prepped and draped in usual manner.  Time-out called.  Leg was exsanguinated using Esmarch bandage and tourniquet elevated to 300 mmHg. Longitudinal midline incision was created with knee in flexion. Dissection down through subcutaneous tissues using the 10 blade.  We performed a medial parapatellar arthrotomy and everted the patella and divided the lateral patellofemoral ligaments.  Distal femur entered using a step-cut drill.  We used an intramedullary guide to resect 10 mm of bone off the femur, set on 5 degrees left.  We then sized the femur 2.5 anterior down and performed anterior, posterior, and chamfer cuts with a 4-in-1 block.  We removed ACL, PCL, meniscal tissues, advanced wear was noted in the knee including bone-on-bone as well as significant chondrocalcinosis involving menisci.  The meniscal tissues were removed. We subluxed the tibia anteriorly.  Performed our tibial cut 90 degrees perpendicular to long axis of the tibia with minimal posterior slope for the posterior cruciate substituting prosthesis.  We then went ahead and checked our gaps, which were symmetric at 10 mm both in flexion/extension, removed excess posterior bone off the posterior femoral condyles with a lamina spreader and half-inch curved osteotome. We then went ahead and completed our tibial preparation with the modular drill and keel punch.  We then went to the femur and did our box cut resection with a 2.5 box cutting guide and then inserted the real 2.5 femoral component into place, reduced the knee with first a 10 and then a 12.5 poly insert  and gained full extension and nice stability both in flexion and extension.  We then went and resurfaced our patella going from 23 mm thickness down to a 17 thickness and then resurfaced with a 32 mm patellar button.  We drilled our lug holes, placed the patellar button, and ranged the knee.  We had nice patellar tracking with no hands technique.  We  removed the trial components, pulse irrigated the knee, and then went ahead and cemented the components into place with DePuy high viscosity cement vacuum mixed and once the cement was in place and the components in place, we reduced the knee with a 12.5 insert.  We did go ahead and as we were reducing the knee, noted that the medial epicondyle popped off or avulsed.  The patient's bone was noted to be soft during the entire surgery.  This avulsion was a surprise to [place].  Fortunately as we placed the knee in full extension and applied distal pressure, the knee was stable and the epicondyle reduced nicely.  We allowed the cement to harden.  Once the cement hardened, we assessed the epicondyle pretty much the entire condyle with the MCL attached to it.  Felt that this needed to be repaired.  We then obtained the C-arm and draped it into the field.  We also got the Biomet small fragment set and used a 70 mm length 4.0 cannulated screw to lag the epicondyle back in place with a washer, this gave good compression and excellent alignment and stability.  We were able to range the knee after that with good stability.  We did go ahead and insert our real 12.5 poly insert before we fixed the epicondyle, so we would not have to stretch that epicondyle ORIF.  With that epicondyle put back down and C-arm confirming its location, we concluded surgery as far as the implants and went ahead and irrigated the knee thoroughly.  We then repaired the parapatellar arthrotomy, we noted the patella to be tracking nicely with no hands technique.  We repaired the parapatellar arthrotomy with interrupted #1 Vicryl suture followed by 2-0 Vicryl subcutaneous closure and 4-0 Monocryl for skin.  The patient was placed in knee immobilizer and will be 50% weightbearing postop.     Doran Heater. Veverly Fells, M.D.     SRN/MEDQ  D:  01/29/2014  T:  01/30/2014  Job:  [id number]

## 2014-01-30 NOTE — Evaluation (Signed)
Physical Therapy Evaluation Patient Details Name: [name] MRN: [id number] DOB: May 13, 1945 Today's Date: 01/30/2014   History of Present Illness  Pt is a 69 y.o. s/p Lt TKA and MCL repair.   Clinical Impression  Pt is s/p Rt TKA and MCL repair resulting in the deficits listed below (see PT Problem List).  Pt will benefit from skilled PT to increase their independence and safety with mobility to allow discharge to the venue listed below. Pt c/o 9/10 pain at end of session.     Follow Up Recommendations Home health PT;Supervision/Assistance - 24 hour    Equipment Recommendations  Rolling walker with 5" wheels;3in1 (PT)    Recommendations for Other Services OT consult     Precautions / Restrictions Precautions Precautions: Fall;Knee Precaution Comments: reviewed no pillow under knee  Required Braces or Orthoses: Knee Immobilizer - Left Knee Immobilizer - Left: On when out of bed or walking Restrictions Weight Bearing Restrictions: Yes LLE Weight Bearing: Partial weight bearing LLE Partial Weight Bearing Percentage or Pounds: 50      Mobility  Bed Mobility Overal bed mobility: Needs Assistance Bed Mobility: Sit to Supine       Sit to supine: Min assist   General bed mobility comments: (A) to bring Lt LE up in bed; limited by pain   Transfers Overall transfer level: Needs assistance Equipment used: Rolling walker (2 wheeled) Transfers: Sit to/from Stand Sit to Stand: Supervision         General transfer comment: min cues for hand placement and safety with RW   Ambulation/Gait Ambulation/Gait assistance: Min guard Ambulation Distance (Feet): 70 Feet Assistive device: Rolling walker (2 wheeled) Gait Pattern/deviations: Step-to pattern;Decreased stance time - left;Decreased step length - right;Antalgic Gait velocity: decr Gait velocity interpretation: Below normal speed for age/gender General Gait Details: pt limited by pain; cues for PWB and RW management;  min guard to steady  Stairs            Wheelchair Mobility    Modified Rankin (Stroke Patients Only)       Balance Overall balance assessment: Needs assistance Sitting-balance support: Feet supported;No upper extremity supported Sitting balance-Leahy Scale: Fair     Standing balance support: During functional activity;Bilateral upper extremity supported Standing balance-Leahy Scale: Poor Standing balance comment: RW to balance                              Pertinent Vitals/Pain Pain Assessment: 0-10 Pain Score: 9  Pain Location: Lt anterior thigh  Pain Descriptors / Indicators: Burning;Sore Pain Intervention(s): Monitored during session;Premedicated before session;Repositioned;Ice applied    Home Living Family/patient expects to be discharged to:: Private residence Living Arrangements: Spouse/significant other Available Help at Discharge: Family;Available 24 hours/day Type of Home: House Home Access: Stairs to enter Entrance Stairs-Rails: Right Entrance Stairs-Number of Steps: 4 Home Layout: One level Home Equipment: None      Prior Function Level of Independence: Independent               Hand Dominance        Extremity/Trunk Assessment   Upper Extremity Assessment: Defer to OT evaluation           Lower Extremity Assessment: LLE deficits/detail   LLE Deficits / Details: AROM 8 to 50 degree   Cervical / Trunk Assessment: Normal  Communication   Communication: No difficulties  Cognition Arousal/Alertness: Awake/alert Behavior During Therapy: WFL for tasks assessed/performed Overall Cognitive Status: Within Functional  Limits for tasks assessed                      General Comments      Exercises Total Joint Exercises Ankle Circles/Pumps: AROM;Both;10 reps Quad Sets: AROM;Left;10 reps Hip ABduction/ADduction: AAROM;Left;10 reps;Supine Long Arc Quad: AAROM;Left;5 reps;Seated      Assessment/Plan    PT  Assessment Patient needs continued PT services  PT Diagnosis Difficulty walking;Generalized weakness;Acute pain   PT Problem List Decreased strength;Decreased range of motion;Decreased mobility;Decreased activity tolerance;Decreased balance;Decreased knowledge of use of DME;Pain  PT Treatment Interventions DME instruction;Gait training;Stair training;Functional mobility training;Therapeutic activities;Therapeutic exercise;Neuromuscular re-education;Balance training;Patient/family education   PT Goals (Current goals can be found in the Care Plan section) Acute Rehab PT Goals Patient Stated Goal: to go home soon PT Goal Formulation: With patient Time For Goal Achievement: 02/06/14 Potential to Achieve Goals: Good    Frequency 7X/week   Barriers to discharge        Co-evaluation               End of Session Equipment Utilized During Treatment: Gait belt;Left knee immobilizer Activity Tolerance: Patient tolerated treatment well Patient left: in bed;with call bell/phone within reach Nurse Communication: Mobility status;Weight bearing status         Time: 8101-7510 PT Time Calculation (min) (ACUTE ONLY): 24 min   Charges:   PT Evaluation $Initial PT Evaluation Tier I: 1 Procedure PT Treatments $Gait Training: 8-22 mins   PT G CodesGustavus Bryant, [place]  [phone] 01/30/2014, 12:47 PM

## 2014-01-30 NOTE — Progress Notes (Signed)
Orthopedics Progress Note  Subjective: I feel good this morning  Objective:  Filed Vitals:   01/30/14 0444  BP: 142/65  Pulse: 79  Temp: 97.6 F (36.4 C)  Resp: 18    General: Awake and alert  Musculoskeletal: dressing intact, NVI Neurovascularly intact  Lab Results  Component Value Date   WBC 11.8* 01/30/2014   HGB 12.1 01/30/2014   HCT 37.2 01/30/2014   MCV 92.3 01/30/2014   PLT 263 01/30/2014       Component Value Date/Time   NA 134* 01/30/2014 0455   K 4.3 01/30/2014 0455   CL 100 01/30/2014 0455   CO2 27 01/30/2014 0455   GLUCOSE 123* 01/30/2014 0455   BUN 9 01/30/2014 0455   CREATININE 0.80 01/30/2014 0455   CALCIUM 8.6 01/30/2014 0455   GFRNONAA 75* 01/30/2014 0455   GFRAA 86* 01/30/2014 0455    Lab Results  Component Value Date   INR 1.10 01/30/2014   INR 1.05 01/19/2014    Assessment/Plan: POD #1 s/p Procedure(s): LEFT TOTAL KNEE ARTHROPLASTY MCL REPAIR PT, OT, D/C planning - possible D/C tomorrow DVT prophylaxis - mechanical and Coumadin  Doran Heater. Veverly Fells, MD 01/30/2014 8:17 AM

## 2014-01-30 NOTE — Clinical Social Work Note (Signed)
CSW received consult for SNF. CSW has reviewed patient's chart and PT recommendation for HHPT. RNCM to be made aware of recommendation. No further needs identified. CSW signing off.  [place], [place] Weekend Clinical Social Worker [phone]

## 2014-01-30 NOTE — Evaluation (Addendum)
Occupational Therapy Evaluation Patient Details Name: [name] MRN: [id number] DOB: 1945-07-16 Today's Date: 01/30/2014    History of Present Illness Pt is a 69 y.o. s/p Lt TKA and MCL repair.    Clinical Impression   Pt s/p above. Pt independent with ADLs, PTA. Feel pt will benefit from acute OT to increase independence with BADLs, prior to d/c. Plan to practice shower transfer next session.    Follow Up Recommendations  No OT follow up;Supervision - Intermittent    Equipment Recommendations  None recommended by OT    Recommendations for Other Services       Precautions / Restrictions Precautions Precautions: Knee Precaution Booklet Issued: No Precaution Comments: reviewed knee precautions Required Braces or Orthoses: Knee Immobilizer - Left Knee Immobilizer - Left: On when out of bed or walking Restrictions Weight Bearing Restrictions: Yes LLE Weight Bearing: Partial weight bearing LLE Partial Weight Bearing Percentage or Pounds: 50      Mobility Bed Mobility Overal bed mobility: Needs Assistance Bed Mobility: Supine to Sit;Sit to Supine     Supine to sit: Supervision        Transfers Overall transfer level: Needs assistance Equipment used: Rolling walker (2 wheeled) Transfers: Sit to/from Stand Sit to Stand: Supervision         General transfer comment: cues for hand placement    Balance  No LOB in session-ambulated with walker                                          ADL Overall ADL's : Needs assistance/impaired     Grooming: Wash/dry hands;Set up;Supervision/safety;Standing               Lower Body Dressing: Sit to/from stand;Set up;Supervision/safety   Toilet Transfer: Min guard;Ambulation;RW (3 in 1 over commode)   Toileting- Clothing Manipulation and Hygiene: Sit to/from stand;Supervision/safety       Functional mobility during ADLs: Min guard;Rolling walker General ADL Comments: Educated on LB dressing  technique. Discussed reacher briefly. Educated on safety such as sitting for LB ADLs, safe shoewear, use of bag on walker, recommended spouse be with her for shower transfer, pets, rugs/items on floor. Educated on shower transfer technique and on 3 in 1. Discussed PWB and how to maintain. Explained use of knee immobilizer and footsie roll.      Vision                     Perception     Praxis      Pertinent Vitals/Pain Pain Assessment: 0-10 Pain Score: 7  Pain Location: Lt knee Pain Intervention(s): Monitored during session;Premedicated before session;Repositioned     Hand Dominance     Extremity/Trunk Assessment Upper Extremity Assessment Upper Extremity Assessment: Overall WFL for tasks assessed   Lower Extremity Assessment Lower Extremity Assessment: Defer to PT evaluation   Cervical / Trunk Assessment Cervical / Trunk Assessment: Normal   Communication Communication Communication: No difficulties   Cognition Arousal/Alertness: Awake/alert Behavior During Therapy: WFL for tasks assessed/performed Overall Cognitive Status: Within Functional Limits for tasks assessed                     General Comments       Exercises       Shoulder Instructions      Home Living Family/patient expects to be discharged to:: Private residence Living Arrangements: Spouse/significant  other Available Help at Discharge: Family;Available 24 hours/day Type of Home: House Home Access: Stairs to enter [employer] of Steps: 4 Entrance Stairs-Rails: Right Home Layout: One level     Bathroom Shower/Tub: Tub/shower unit;Walk-in shower Shower/tub characteristics: Door (on walk in shower) Bathroom Toilet:  (has [facility])     Home Equipment: Bedside commode          Prior Functioning/Environment Level of Independence: Independent             OT Diagnosis: Acute pain   OT Problem List: Decreased strength;Decreased knowledge of use of DME or  AE;Decreased knowledge of precautions;Pain;Decreased range of motion   OT Treatment/Interventions: Self-care/ADL training;DME and/or AE instruction;Therapeutic activities;Patient/family education;Balance training    OT Goals(Current goals can be found in the care plan section) Acute Rehab OT Goals Patient Stated Goal: home tomorrow OT Goal Formulation: With patient Time For Goal Achievement: 02/06/14 Potential to Achieve Goals: Good ADL Goals Pt Will Perform Lower Body Dressing: with modified independence;sit to/from stand Pt Will Transfer to Toilet: with modified independence;ambulating (3 in 1 over commode) Pt Will Perform Tub/Shower Transfer: Shower transfer;ambulating;3 in 1;rolling walker;with supervision  OT Frequency: Min 2X/week   Barriers to D/C:            Co-evaluation              End of Session Equipment Utilized During Treatment: Gait belt;Rolling walker;Left knee immobilizer CPM Left Knee CPM Left Knee: Off Activity Tolerance: Patient tolerated treatment well Patient left: in bed;with call bell/phone within reach;with family/visitor present;with nursing/sitter in room   Time: 1433-1453 OT Time Calculation (min): 20 min Charges:  OT General Charges $OT Visit: 1 Procedure OT Evaluation $Initial OT Evaluation Tier I: 1 Procedure G-CodesBenito Mccreedy OTR/L [phone] 01/30/2014, 3:45 PM

## 2014-01-30 NOTE — Plan of Care (Signed)
Problem: Consults Goal: Diagnosis- Total Joint Replacement Primary Total Knee Left     

## 2014-01-30 NOTE — Progress Notes (Signed)
CARE MANAGEMENT NOTE 01/30/2014  Patient:  [name], [name]   Account Number:  [id number]  Date Initiated:  01/30/2014  Documentation initiated by:  Ismar Yabut  Subjective/Objective Assessment:   Pt. with L knee OA, L TKA MCL repair.     Action/Plan:   Pt. to d/c to home with [facility] and spouse will assist with care.   Anticipated DC Date:  01/31/2014   Anticipated DC Plan:  [place]  In-house referral  NA      DC Planning Services  CM consult      [facility] Choice  HOME HEALTH   Choice offered to / List presented to:  C-1 Patient           Status of service:  In process, will continue to follow Medicare Important Message given?   (If response is "NO", the following Medicare IM given date fields will be blank) Date Medicare IM given:   Medicare IM given by:   Date Additional Medicare IM given:   Additional Medicare IM given by:    Discharge Disposition:    Per UR Regulation:    If discussed at Long Length of Stay Meetings, dates discussed:    Comments:  01/30/14 4:40pm CM spoke with [name] [facility] Weekend CM, advised of  CM referral status.  [name] states she will follow up with [facility] agency on 01/31/14 to make referral. [name] H. Burt Knack RN, BSN, CCM   01/30/14 4:23pm CM spoke with [facility] [phone]) states he works in the [employer]. and is unable to take new referral.   States to call hospital main number ([phone]) and ask for on-call [facility] nurse to make urgent referral.    [name] H. Burt Knack RN, BSN, [place] [phone])  01/30/14 4:10pm  CM referral received from [name] [facility] Weekend CM.  CM spoke with pt. and spouse regarding d/c planning needs and given choice for [facility] services.  Pt. states MD stated that she may go home tomorrow (01/31/14 or Monday (02/01/14).  Patient states she would like to use [facility] and request that [name] be her therapist.  Patient states she already has all of  the recommended DME.  Advised patient that I  would discuss her request with my co-worker and  a  CM will follow up to make the referral the [facility] agency.   Advised the [facility] agency may be closed on the weekend.   Verified Pt's demographics. Spoke with pt's nurse [name]), advised CM will follow up to complete [facility] referral when [facility] agency available to take referral.   Pt's nurse states she will obtain order for HHPT.

## 2014-01-31 LAB — CBC
HCT: 32.5 % — ABNORMAL LOW (ref 36.0–46.0)
Hemoglobin: 10.7 g/dL — ABNORMAL LOW (ref 12.0–15.0)
MCH: 30.4 pg (ref 26.0–34.0)
MCHC: 32.9 g/dL (ref 30.0–36.0)
MCV: 92.3 fL (ref 78.0–100.0)
Platelets: 228 10*3/uL (ref 150–400)
RBC: 3.52 MIL/uL — ABNORMAL LOW (ref 3.87–5.11)
RDW: 13.5 % (ref 11.5–15.5)
WBC: 11.6 10*3/uL — ABNORMAL HIGH (ref 4.0–10.5)

## 2014-01-31 LAB — PROTIME-INR
INR: 1.89 — ABNORMAL HIGH (ref 0.00–1.49)
Prothrombin Time: 21.9 s — ABNORMAL HIGH (ref 11.6–15.2)

## 2014-01-31 MED ORDER — WARFARIN SODIUM 3 MG PO TABS
3.0000 mg | ORAL_TABLET | Freq: Once | ORAL | Status: AC
  Administered 2014-01-31: 3 mg via ORAL
  Filled 2014-01-31: qty 1

## 2014-01-31 NOTE — Progress Notes (Signed)
ANTICOAGULATION CONSULT NOTE - Follow Up Consult  Pharmacy Consult for Coumadin Indication: VTE prophylaxis  Allergies  Allergen Reactions  . Neurontin [Gabapentin] Other (See Comments)    Severe falls   . Codeine Nausea And Vomiting  . Oxycodone Nausea And Vomiting  . Tramadol     REACTION: GI discomfort with high doses  . Cyclobenzaprine Palpitations  . Sulfa Antibiotics Rash    Patient Measurements: Height: 5\' 3"  (160 cm) Weight: 147 lb 11.2 oz (66.996 kg) IBW/kg (Calculated) : 52.4 Heparin Dosing Weight:   Vital Signs: Temp: 98.7 F (37.1 C) (01/31 1200) Temp Source: Oral (01/31 1200) BP: 137/53 mmHg (01/31 1200) Pulse Rate: 98 (01/31 1200)  Labs:  Recent Labs  01/30/14 0455 01/31/14 0415  HGB 12.1 10.7*  HCT 37.2 32.5*  PLT 263 228  LABPROT 14.4 21.9*  INR 1.10 1.89*  CREATININE 0.80  --     Estimated Creatinine Clearance: 62.7 mL/min (by C-G formula based on Cr of 0.8).   Medications:  Scheduled:  . ferrous sulfate  325 mg Oral TID PC  . patient's guide to using coumadin book   Does not apply Once  . warfarin   Does not apply Once  . Warfarin - Pharmacist Dosing Inpatient   Does not apply q1800    Assessment: 69yo female s/p TKR, on COumadin for VTE px.  INR 1.89 this- a large increase.  Hg is decreased to 10.7 and pltc is wnl.  No bleeding problems noted.  Goal of Therapy:  INR 2-3 Monitor platelets by anticoagulation protocol: Yes   Plan:  Coumadin 3mg  F/U in AM  Gracy Bruins, PharmD [facility]

## 2014-01-31 NOTE — Progress Notes (Signed)
Utilization review completed.  

## 2014-01-31 NOTE — Progress Notes (Signed)
Occupational Therapy Treatment Patient Details Name: [name] MRN: [id number] DOB: [birth year] Today's Date: 01/31/2014    History of present illness Pt is a 69 y.o. s/p Lt TKA and MCL repair.    OT comments  Pt limited by pain and nausea today. Education provided in session. Plan to practice shower transfer next session.  Follow Up Recommendations  No OT follow up;Supervision - Intermittent    Equipment Recommendations  None recommended by OT    Recommendations for Other Services      Precautions / Restrictions Precautions Precautions: Knee Precaution Booklet Issued: No Precaution Comments: educated on precautions Required Braces or Orthoses: Knee Immobilizer - Left Knee Immobilizer - Left: On when out of bed or walking Restrictions Weight Bearing Restrictions: Yes LLE Weight Bearing: Partial weight bearing LLE Partial Weight Bearing Percentage or Pounds: 50       Mobility Bed Mobility Overal bed mobility: Needs Assistance Bed Mobility: Supine to Sit     Supine to sit: Min assist   General bed mobility comments: assist with Lt LE  Transfers Overall transfer level: Needs assistance Equipment used: Rolling walker (2 wheeled) Transfers: Sit to/from Stand Sit to Stand: Min guard         General transfer comment: cues for technique.        ADL Overall ADL's : Needs assistance/impaired     Grooming: Wash/dry hands;Supervision/safety;Standing                 Lower Body Dressing Details (indicate cue type and reason): unable to get left sock off today by bending over Toilet Transfer: Min guard;Ambulation;RW (3 in 1 over commode)   Toileting- Clothing Manipulation and Hygiene: Supervision/safety;Sit to/from stand;Sitting/lateral lean       Functional mobility during ADLs: Min guard;Rolling walker General ADL Comments: Educated on LB dressing technique. Reviewed safety tips such as safe shoewear, use of bag on walker, sitting for most of LB ADLs.  Educated on shower transfer technique and demonstrated, but pt not feeling well and in a lot of pain so did not practice it. Educated on AE and explained benefit of pt trying to reach to Lt foot as it allows knee to bend.      Vision                     Perception     Praxis      Cognition  Awake/Alert Behavior During Therapy: WFL for tasks assessed/performed Overall Cognitive Status: Within Functional Limits for tasks assessed                       Extremity/Trunk Assessment                  Shoulder Instructions       General Comments      Pertinent Vitals/ Pain       Pain Assessment: 0-10 Pain Score: 7  Pain Location: left knee and ankle Pain Descriptors / Indicators: Aching Pain Intervention(s): Monitored during session;Limited activity within patient's tolerance  Home Living                                          Prior Functioning/Environment              Frequency Min 2X/week     Progress Toward Goals  OT Goals(current goals can now be found  in the care plan section)  Progress towards OT goals: Not progressing toward goals - comment  Acute Rehab OT Goals Patient Stated Goal: not stated OT Goal Formulation: With patient Time For Goal Achievement: 02/06/14 Potential to Achieve Goals: Good ADL Goals Pt Will Perform Lower Body Dressing: with modified independence;sit to/from stand Pt Will Transfer to Toilet: with modified independence;ambulating (3 in 1 over commode) Pt Will Perform Tub/Shower Transfer: Shower transfer;ambulating;3 in 1;rolling walker;with supervision  Plan Discharge plan remains appropriate    Co-evaluation                 End of Session Equipment Utilized During Treatment: Gait belt;Rolling walker;Left knee immobilizer   Activity Tolerance Patient limited by pain;Other (comment) (became nauseous)   Patient Left in bed;with call bell/phone within reach   Nurse Communication           Time: 2257-5051 OT Time Calculation (min): 15 min  Charges: OT General Charges $OT Visit: 1 Procedure OT Treatments $Self Care/Home Management : 8-22 mins  Benito Mccreedy OTR/L [phone] 01/31/2014, 5:28 PM

## 2014-01-31 NOTE — Progress Notes (Signed)
Physical Therapy Treatment Patient Details Name: [name] MRN: [id number] DOB: [birth year] Today's Date: 01/31/2014    History of Present Illness Pt is a 69 y.o. s/p Lt TKA and MCL repair.     PT Comments    Pt greatly limited by pain and nausea this session. Reviewed HEP and stair mobility. Encouraged OOB activity with nursing today. If pain and nausea controlled, pt may be appropriate for D/C home. Will plan to continue educating on HEP and stair mobility with husband next session. ROM 5 to 45 degrees this session due to pain.   Follow Up Recommendations  Home health PT;Supervision/Assistance - 24 hour     Equipment Recommendations  Rolling walker with 5" wheels;3in1 (PT)    Recommendations for Other Services       Precautions / Restrictions Precautions Precautions: Knee Precaution Booklet Issued: Yes (comment) Precaution Comments: pt given HEP handout and reviewed knee precautions  Required Braces or Orthoses: Knee Immobilizer - Left Knee Immobilizer - Left: On when out of bed or walking Restrictions Weight Bearing Restrictions: Yes LLE Weight Bearing: Partial weight bearing LLE Partial Weight Bearing Percentage or Pounds: 50    Mobility  Bed Mobility Overal bed mobility: Needs Assistance Bed Mobility: Supine to Sit     Supine to sit: Min guard;HOB elevated     General bed mobility comments: cues for technique and to use UEs to lower Lt LE off EOB  Transfers Overall transfer level: Needs assistance Equipment used: Rolling walker (2 wheeled) Transfers: Sit to/from Stand Sit to Stand: Supervision         General transfer comment: cues for hand placement  Ambulation/Gait Ambulation/Gait assistance: Supervision Ambulation Distance (Feet): 60 Feet Assistive device: Rolling walker (2 wheeled) Gait Pattern/deviations: Step-to pattern Gait velocity: decr Gait velocity interpretation: Below normal speed for age/gender General Gait Details: limited today  due to pain; decr heel strike on Lt LE; able to maintain PWB status; UEs fatigued quickly    Stairs Stairs: Yes Stairs assistance: Min assist Stair Management: No rails;Step to pattern;Backwards;With walker Number of Stairs: 3 General stair comments: multimodal cues for technique; min (A) to perform stair ambulation   Wheelchair Mobility    Modified Rankin (Stroke Patients Only)       Balance Overall balance assessment: Needs assistance Sitting-balance support: Feet supported;No upper extremity supported Sitting balance-Leahy Scale: Good     Standing balance support: During functional activity;Bilateral upper extremity supported Standing balance-Leahy Scale: Poor Standing balance comment: RW                    Cognition Arousal/Alertness: Awake/alert Behavior During Therapy: WFL for tasks assessed/performed Overall Cognitive Status: Within Functional Limits for tasks assessed                      Exercises Total Joint Exercises Ankle Circles/Pumps: AROM;Both;10 reps Quad Sets: AROM;Left;10 reps Hip ABduction/ADduction: AAROM;Left;10 reps;Seated    General Comments General comments (skin integrity, edema, etc.): given HEP and reviewed with pt; exercises limited due to pain      Pertinent Vitals/Pain Pain Assessment: 0-10 Pain Score: 7  Pain Location: Lt knee  Pain Descriptors / Indicators: Aching;Sore;Sharp Pain Intervention(s): Monitored during session;Premedicated before session;Repositioned;Ice applied    Home Living                      Prior Function            PT Goals (current goals can now be found  in the care plan section) Acute Rehab PT Goals Patient Stated Goal: to not feel so sick PT Goal Formulation: With patient Time For Goal Achievement: 02/06/14 Potential to Achieve Goals: Good Progress towards PT goals: Progressing toward goals    Frequency  7X/week    PT Plan Current plan remains appropriate     Co-evaluation             End of Session Equipment Utilized During Treatment: Gait belt;Left knee immobilizer Activity Tolerance: Patient limited by pain;Patient limited by fatigue Patient left: in chair;with call bell/phone within reach     Time: 0725-0750 PT Time Calculation (min) (ACUTE ONLY): 25 min  Charges:  $Gait Training: 8-22 mins $Therapeutic Exercise: 8-22 mins                    G Codes:      Gustavus Bryant, [place]  [phone] 01/31/2014, 7:55 AM

## 2014-01-31 NOTE — Progress Notes (Signed)
Physical Therapy Treatment Patient Details Name: [name] MRN: [id number] DOB: [birth year] Today's Date: 01/31/2014    History of Present Illness Pt is a 69 y.o. s/p Lt TKA and MCL repair.     PT Comments    Pt limited by pain this session. Min guard with mobility due to pain and difficulty WBing on Lt LE. Pt continues to be motivated to mobilize and work on ROM/strengthening exercises. Patient needs to practice stairs next session with husband.   Follow Up Recommendations  Home health PT;Supervision/Assistance - 24 hour     Equipment Recommendations  Rolling walker with 5" wheels;3in1 (PT)    Recommendations for Other Services       Precautions / Restrictions Precautions Precautions: Knee Precaution Comments: reviewed no pillow under knee  (including ice pack)  Required Braces or Orthoses: Knee Immobilizer - Left Knee Immobilizer - Left: On when out of bed or walking Restrictions Weight Bearing Restrictions: Yes LLE Weight Bearing: Partial weight bearing LLE Partial Weight Bearing Percentage or Pounds: 50    Mobility  Bed Mobility Overal bed mobility: Needs Assistance Bed Mobility: Sit to Supine       Sit to supine: Supervision   General bed mobility comments: bed flattened; educated on hooking technique for Rt LE to (A) Lt LE in bed  Transfers Overall transfer level: Needs assistance Equipment used: Rolling walker (2 wheeled) Transfers: Sit to/from Stand Sit to Stand: Supervision;Min guard         General transfer comment: min guard this session to balance when transferring UEs to RW due to pain and difficulty WBing on Lt LE ; cues for safety   Ambulation/Gait Ambulation/Gait assistance: Min guard Ambulation Distance (Feet): 55 Feet Assistive device: Rolling walker (2 wheeled) Gait Pattern/deviations: Step-to pattern Gait velocity: decr Gait velocity interpretation: Below normal speed for age/gender General Gait Details: pt in incr amount of pain  with WBing this session; pt ambulating up on toes and with decr heel strike due to pain; demo incr fatigue this session; required one standing rest break   Stairs         General stair comments: plan to educate with husband prior to D/C next session  Wheelchair Mobility    Modified Rankin (Stroke Patients Only)       Balance Overall balance assessment: Needs assistance Sitting-balance support: Feet supported;No upper extremity supported Sitting balance-Leahy Scale: Good     Standing balance support: During functional activity;Single extremity supported;Bilateral upper extremity supported Standing balance-Leahy Scale: Poor Standing balance comment: min guard this session at toilet; unsteady                     Cognition Arousal/Alertness: Awake/alert Behavior During Therapy: WFL for tasks assessed/performed Overall Cognitive Status: Within Functional Limits for tasks assessed                      Exercises Total Joint Exercises Ankle Circles/Pumps: AROM;Both;10 reps Quad Sets: AROM;Left;10 reps Heel Slides: AAROM;Left;10 reps;Supine Hip ABduction/ADduction: AAROM;Left;10 reps;Seated Long Arc Quad: 10 reps;Left;Seated    General Comments        Pertinent Vitals/Pain Pain Assessment: 0-10 Pain Score: 8  Pain Location: Lt knee; increases with exercises and walking  Pain Descriptors / Indicators: Aching;Sharp Pain Intervention(s): Monitored during session;Premedicated before session;Repositioned;Ice applied    Home Living                      Prior Function  PT Goals (current goals can now be found in the care plan section) Acute Rehab PT Goals Patient Stated Goal: to not hurt so bad  PT Goal Formulation: With patient Time For Goal Achievement: 02/06/14 Potential to Achieve Goals: Good Progress towards PT goals: Progressing toward goals    Frequency  7X/week    PT Plan Current plan remains appropriate     Co-evaluation             End of Session Equipment Utilized During Treatment: Gait belt;Left knee immobilizer Activity Tolerance: Patient limited by pain;Patient limited by fatigue Patient left: with call bell/phone within reach;in bed;with family/visitor present     Time: 5916-3846 PT Time Calculation (min) (ACUTE ONLY): 24 min  Charges:  $Gait Training: 8-22 mins $Therapeutic Exercise: 8-22 mins                    G Codes:      Elie Confer [place], [place] 01/31/2014, 2:03 PM

## 2014-01-31 NOTE — Progress Notes (Signed)
Subjective: 2 Days Post-Op Procedure(s) (LRB): LEFT TOTAL KNEE ARTHROPLASTY MCL REPAIR (Left) Patient reports pain as severe.  She has difficulty tolerating oral narcs due to N/V.  She is getting zofran and reglan.  No BM yet.  + flatus.  Objective: Vital signs in last 24 hours: Temp:  [98.2 F (36.8 C)-98.9 F (37.2 C)] 98.4 F (36.9 C) (01/31 0612) Pulse Rate:  [102-115] 115 (01/31 0612) Resp:  [16-18] 16 (01/31 0825) BP: (118-151)/(59-70) 118/66 mmHg (01/31 0612) SpO2:  [96 %-99 %] 98 % (01/31 0825)  Intake/Output from previous day: 01/30 0701 - 01/31 0700 In: 1340 [P.O.:840; I.V.:500] Out: 300 [Urine:300] Intake/Output this shift: Total I/O In: 240 [P.O.:240] Out: -    Recent Labs  01/30/14 0455 01/31/14 0415  HGB 12.1 10.7*    Recent Labs  01/30/14 0455 01/31/14 0415  WBC 11.8* 11.6*  RBC 4.03 3.52*  HCT 37.2 32.5*  PLT 263 228    Recent Labs  01/30/14 0455  NA 134*  K 4.3  CL 100  CO2 27  BUN 9  CREATININE 0.80  GLUCOSE 123*  CALCIUM 8.6    Recent Labs  01/30/14 0455 01/31/14 0415  INR 1.10 1.89*    PE:  wn wd woman in nad.  L Knee with c/d/i incision.  No active bleeding.  Assessment/Plan: 2 Days Post-Op Procedure(s) (LRB): LEFT TOTAL KNEE ARTHROPLASTY MCL REPAIR (Left) Dressing changed.  OOB with PT to ambulate.  Hopefully home tomorrow.  [name] 01/31/2014, 8:59 AM

## 2014-01-31 NOTE — Progress Notes (Signed)
Pt continues to have nausea from the vicodin and is requiring zofran to alleviate her nausea. Is requesting another type of pain medication for home.

## 2014-02-01 ENCOUNTER — Encounter (HOSPITAL_COMMUNITY): Payer: Self-pay | Admitting: Orthopedic Surgery

## 2014-02-01 LAB — CBC
HCT: 31.1 % — ABNORMAL LOW (ref 36.0–46.0)
Hemoglobin: 10.1 g/dL — ABNORMAL LOW (ref 12.0–15.0)
MCH: 30.2 pg (ref 26.0–34.0)
MCHC: 32.5 g/dL (ref 30.0–36.0)
MCV: 93.1 fL (ref 78.0–100.0)
Platelets: 229 10*3/uL (ref 150–400)
RBC: 3.34 MIL/uL — ABNORMAL LOW (ref 3.87–5.11)
RDW: 13.8 % (ref 11.5–15.5)
WBC: 11.5 10*3/uL — ABNORMAL HIGH (ref 4.0–10.5)

## 2014-02-01 LAB — PROTIME-INR
INR: 2.08 — ABNORMAL HIGH (ref 0.00–1.49)
Prothrombin Time: 23.6 seconds — ABNORMAL HIGH (ref 11.6–15.2)

## 2014-02-01 MED ORDER — ONDANSETRON HCL 4 MG PO TABS: 4.0000 mg | ORAL_TABLET | Freq: Four times a day (QID) | ORAL | Status: DC | PRN

## 2014-02-01 NOTE — Progress Notes (Signed)
CARE MANAGEMENT NOTE 02/01/2014  Patient:  [name], [name]   Account Number:  [id number]  Date Initiated:  01/30/2014  Documentation initiated by:  COOPER,DARLENE  Subjective/Objective Assessment:   Pt. with L knee OA, L TKA MCL repair.     Action/Plan:   Pt. to d/c to home with [facility] and spouse will assist with care.   Anticipated DC Date:  02/01/2014   Anticipated DC Plan:  [place]  In-house referral  NA      DC Planning Services  CM consult      [facility] Choice  HOME HEALTH   Choice offered to / List presented to:  C-1 Patient   DME arranged  3-N-1  [place]      DME agency  [employer]     [place] arranged  HH-2 PT      [place] agency  [place]   Status of service:  Completed, signed off Medicare Important Message given?  YES (If response is "NO", the following Medicare IM given date fields will be blank) Date Medicare IM given:  02/01/2014 Medicare IM given by:  [facility] Date Additional Medicare IM given:   Additional Medicare IM given by:    Discharge Disposition:  [place]  Per UR Regulation:  Reviewed for med. necessity/level of care/duration of stay  If discussed at [place] of Stay Meetings, dates discussed:    Comments:  02/01/14 Spoke with patient and verified that she wants [facility]. [place], spoke with Abigail Butts, they received a referral for HHPT from MD office. Patient is set up for HHPT, service to start 02/02/14. Faxed d/c summary, op note, and H and P to [phone]. Fuller Plan RN, [facility]   01/30/14 4:40pm CM spoke with Mariane Masters [facility] Weekend CM, advised of  CM referral status.  Judson Roch states she will follow up with [facility] agency on 01/31/14 to make referral. Darlene H. Burt Knack RN, BSN, CCM   01/30/14 4:23pm CM spoke with [facility] [phone]) states he works in the [employer]. and is unable to take new referral.   States to call  hospital main number ([phone]) and ask for on-call [facility] nurse to make urgent referral.    Darlene H. Burt Knack RN, BSN, [place] [phone])  01/30/14 4:10pm  CM referral received from Mariane Masters [facility] Weekend CM.  CM spoke with pt. and spouse regarding d/c planning needs and given choice for [facility] services.  Pt. states MD stated that she may go home tomorrow (01/31/14 or Monday (02/01/14).  Patient states she would like to use [facility] and request that Laveda Abbe be her therapist.  Patient states she already has all of  the recommended DME.  Advised patient that I would discuss her request with my co-worker and  a  CM will follow up to make the referral the [facility] agency.   Advised the [facility] agency may be closed on the weekend.   Verified Pt's demographics. Spoke with pt's nurse Kenney Houseman), advised CM will follow up to complete [facility] referral when [facility] agency available to take referral.   Pt's nurse states she will obtain order for HHPT.

## 2014-02-01 NOTE — Discharge Summary (Signed)
Physician Discharge Summary   Patient ID: [name] MRN: [id number] DOB/AGE: 04-27-1945 69 y.o.  Admit date: 01/29/2014 Discharge date: 02/01/2014  Admission Diagnoses:  Active Problems:   S/P knee replacement   Discharge Diagnoses:  Same   Surgeries: Procedure(s): LEFT TOTAL KNEE ARTHROPLASTY MCL REPAIR on 01/29/2014   Consultants: PT/OT  Discharged Condition: Stable  Hospital Course: [name] is an 69 y.o. female who was admitted 01/29/2014 with a chief complaint of No chief complaint on file. , and found to have a diagnosis of <principal problem not specified>.  They were brought to the operating room on 01/29/2014 and underwent the above named procedures.    The patient had an uncomplicated hospital course and was stable for discharge.  Recent vital signs:  Filed Vitals:   02/01/14 0435  BP: 134/63  Pulse: 86  Temp: 98.6 F (37 C)  Resp: 18    Recent laboratory studies:  Results for orders placed or performed during the hospital encounter of 01/29/14  Protime-INR  Result Value Ref Range   Prothrombin Time 14.4 11.6 - 15.2 seconds   INR 1.10 0.00 - 1.49  CBC  Result Value Ref Range   WBC 11.8 (H) 4.0 - 10.5 K/uL   RBC 4.03 3.87 - 5.11 MIL/uL   Hemoglobin 12.1 12.0 - 15.0 g/dL   HCT 37.2 36.0 - 46.0 %   MCV 92.3 78.0 - 100.0 fL   MCH 30.0 26.0 - 34.0 pg   MCHC 32.5 30.0 - 36.0 g/dL   RDW 13.0 11.5 - 15.5 %   Platelets 263 150 - 400 K/uL  Basic metabolic panel  Result Value Ref Range   Sodium 134 (L) 135 - 145 mmol/L   Potassium 4.3 3.5 - 5.1 mmol/L   Chloride 100 96 - 112 mmol/L   CO2 27 19 - 32 mmol/L   Glucose, Bld 123 (H) 70 - 99 mg/dL   BUN 9 6 - 23 mg/dL   Creatinine, Ser 0.80 0.50 - 1.10 mg/dL   Calcium 8.6 8.4 - 10.5 mg/dL   GFR calc non Af Amer 75 (L) >90 mL/min   GFR calc Af Amer 86 (L) >90 mL/min   Anion gap 7 5 - 15  Protime-INR  Result Value Ref Range   Prothrombin Time 21.9 (H) 11.6 - 15.2 seconds   INR 1.89 (H) 0.00 - 1.49    CBC  Result Value Ref Range   WBC 11.6 (H) 4.0 - 10.5 K/uL   RBC 3.52 (L) 3.87 - 5.11 MIL/uL   Hemoglobin 10.7 (L) 12.0 - 15.0 g/dL   HCT 32.5 (L) 36.0 - 46.0 %   MCV 92.3 78.0 - 100.0 fL   MCH 30.4 26.0 - 34.0 pg   MCHC 32.9 30.0 - 36.0 g/dL   RDW 13.5 11.5 - 15.5 %   Platelets 228 150 - 400 K/uL  Protime-INR  Result Value Ref Range   Prothrombin Time 23.6 (H) 11.6 - 15.2 seconds   INR 2.08 (H) 0.00 - 1.49  CBC  Result Value Ref Range   WBC 11.5 (H) 4.0 - 10.5 K/uL   RBC 3.34 (L) 3.87 - 5.11 MIL/uL   Hemoglobin 10.1 (L) 12.0 - 15.0 g/dL   HCT 31.1 (L) 36.0 - 46.0 %   MCV 93.1 78.0 - 100.0 fL   MCH 30.2 26.0 - 34.0 pg   MCHC 32.5 30.0 - 36.0 g/dL   RDW 13.8 11.5 - 15.5 %   Platelets 229 150 - 400 K/uL  Discharge Medications:     Medication List    STOP taking these medications        cefUROXime 500 MG tablet  Commonly known as:  CEFTIN     MELOXICAM PO     predniSONE 10 MG tablet  Commonly known as:  DELTASONE      TAKE these medications        acetaminophen 325 MG tablet  Commonly known as:  TYLENOL  Take 650 mg by mouth every 6 (six) hours as needed for headache. Per bottle as needed for headache     Acidophilus Caps capsule  Take 1 capsule by mouth every morning.     aspirin EC 81 MG tablet  Take 81 mg by mouth daily as needed (pain).     benzonatate 100 MG capsule  Commonly known as:  TESSALON  Take 1-2 every 4-6 hours as needed for cough     chlorpheniramine 4 MG tablet  Commonly known as:  CHLOR-TRIMETON  12mg  at bedtime     ciclopirox 8 % solution  Commonly known as:  PENLAC  Apply 1 application topically at bedtime. Apply over nail and surrounding skin. Apply daily over previous coat. After seven (7) days, may remove with alcohol and continue cycle.     dextromethorphan 30 MG/5ML liquid  Commonly known as:  DELSYM  Take 2 tsp every 6  hours as needed for cough     famotidine 40 MG tablet  Commonly known as:  PEPCID  Take 1 tablet  (40 mg total) by mouth at bedtime.     fluticasone 50 MCG/ACT nasal spray  Commonly known as:  FLONASE  Place 2 sprays into both nostrils 2 (two) times daily.     FLUTTER Devi  Use as directed     HYDROcodone-acetaminophen 5-325 MG per tablet  Commonly known as:  NORCO  Take 1 tablet by mouth every 6 (six) hours as needed for moderate pain.     HYDROmorphone 2 MG tablet  Commonly known as:  DILAUDID  Take 0.5-1 tablets (1-2 mg total) by mouth every 4 (four) hours as needed for severe pain.     levothyroxine 88 MCG tablet  Commonly known as:  SYNTHROID, LEVOTHROID  Take 88 mcg by mouth daily before breakfast.     LORazepam 0.5 MG tablet  Commonly known as:  ATIVAN  Take 0.5 mg by mouth every 6 (six) hours as needed for anxiety or sleep.     Magnesium 250 MG Tabs  Take 1 tablet by mouth every other day. Take 1 tablet every other day     methocarbamol 500 MG tablet  Commonly known as:  ROBAXIN  Take 1 tablet (500 mg total) by mouth 3 (three) times daily as needed.     promethazine 12.5 MG tablet  Commonly known as:  PHENERGAN  Take 12.5 mg by mouth every 6 (six) hours as needed for nausea or vomiting.     Vitamin D 2000 UNITS Caps  Take 1 capsule by mouth every morning.     warfarin 5 MG tablet  Commonly known as:  COUMADIN  Take 1 tablet (5 mg total) by mouth daily.        Diagnostic Studies: Dg Knee 1-2 Views Left  02-03-14   CLINICAL DATA:  Left knee arthroplasty, mcl repair  EXAM: LEFT KNEE - 1-2 VIEW; DG C-ARM 61-120 MIN  COMPARISON:  None.  FINDINGS: Spot fluoroscopic intraoperative views demonstrate left total knee arthroplasty. Components appear aligned in the  frontal plane. Medial femoral condyle fixation screw noted.  IMPRESSION: Status post left total knee arthroplasty.  No complicating feature.   Electronically Signed   By: Daryll Brod M.D.   On: 01/29/2014 16:32   Dg Knee Left Port  01/29/2014   CLINICAL DATA:  Status post total left knee replacement   EXAM: PORTABLE LEFT KNEE - 1-2 VIEW  COMPARISON:  None.  FINDINGS: The left knee demonstrates a total knee arthroplasty without evidence of hardware failure complication. There is no significant joint effusion. There is no fracture or dislocation. The alignment is anatomic. Post-surgical changes noted in the surrounding soft tissues.  IMPRESSION: Left total knee arthroplasty.   Electronically Signed   By: Kathreen Devoid   On: 01/29/2014 19:13   Dg C-arm 1-60 Min  01/29/2014   CLINICAL DATA:  Left knee arthroplasty, mcl repair  EXAM: LEFT KNEE - 1-2 VIEW; DG C-ARM 61-120 MIN  COMPARISON:  None.  FINDINGS: Spot fluoroscopic intraoperative views demonstrate left total knee arthroplasty. Components appear aligned in the frontal plane. Medial femoral condyle fixation screw noted.  IMPRESSION: Status post left total knee arthroplasty.  No complicating feature.   Electronically Signed   By: Daryll Brod M.D.   On: 01/29/2014 16:32    Disposition: Final discharge disposition not confirmed        Follow-up Information    Follow up with NORRIS,STEVEN R, MD. Call in 2 weeks.   Specialty:  Orthopedic Surgery   Why:  [phone]   Contact information:   [address] [place] [address] [phone]       Follow up with [facility]   Why:  They will contact you to schedule home therapy visits.   Contact information:   [address] [place] [address] [phone]        Signed: Ventura Bruns 02/01/2014, 9:57 AM

## 2014-02-01 NOTE — Progress Notes (Signed)
Physical Therapy Treatment Patient Details Name: [name] MRN: [id number] DOB: [birth year] Today's Date: 02/01/2014    History of Present Illness Pt is a 69 y.o. s/p Lt TKA and MCL repair.     PT Comments    Overall, pt moving well, despite the onset of back spasm during therapeutic exercise; Husband present and giving excellent assist; Continued difficulty with tolerance of therex, but pt is very motivated to get better; OK for dc home from PT standpoint   Follow Up Recommendations  Home health PT;Supervision/Assistance - 24 hour     Equipment Recommendations  Rolling walker with 5" wheels;3in1 (PT)    Recommendations for Other Services       Precautions / Restrictions Precautions Precautions: Knee Precaution Booklet Issued: No Precaution Comments: Pt educated to not allow any pillow or bolster under knee for healing with optimal range of motion.  Required Braces or Orthoses: Knee Immobilizer - Left Knee Immobilizer - Left: On when out of bed or walking Restrictions Weight Bearing Restrictions: Yes LLE Weight Bearing: Partial weight bearing LLE Partial Weight Bearing Percentage or Pounds: 50    Mobility  Bed Mobility Overal bed mobility: Needs Assistance Bed Mobility: Supine to Sit     Supine to sit: Min assist     General bed mobility comments: assist with Lt LE  Transfers Overall transfer level: Needs assistance Equipment used: Rolling walker (2 wheeled) Transfers: Sit to/from Stand Sit to Stand: Supervision;Min guard         General transfer comment: Cues for hand placement and technique  Ambulation/Gait Ambulation/Gait assistance: Min guard Ambulation Distance (Feet): 40 Feet (20+20) Assistive device: Rolling walker (2 wheeled) Gait Pattern/deviations: Step-through pattern (emerging)     General Gait Details: verbal and demo cues to bear weihgt into RW to keep PWB in L stnace   Stairs Stairs: Yes Stairs assistance: Min assist Stair  Management: No rails;Step to pattern;Backwards;With walker Number of Stairs: 5 General stair comments: Cues for technique, and sequencing; Husband present and gave correct steady assist  Wheelchair Mobility    Modified Rankin (Stroke Patients Only)       Balance                                    Cognition Arousal/Alertness: Awake/alert Behavior During Therapy: WFL for tasks assessed/performed Overall Cognitive Status: Within Functional Limits for tasks assessed                      Exercises Total Joint Exercises Ankle Circles/Pumps: AROM;Both;10 reps Quad Sets: AROM;Left;10 reps Heel Slides: AAROM;Left;10 reps;Supine (limited by muscle guarding and pain) Heel Slides Limitations: pain Straight Leg Raises: AAROM;Left;10 reps Goniometric ROM: approx 2-40, limited by pain    General Comments        Pertinent Vitals/Pain Pain Assessment: 0-10 Pain Score: 7  Pain Location: L knee with flexion therex; also with noted back spasms during therex Pain Descriptors / Indicators: Grimacing;Spasm Pain Intervention(s): Limited activity within patient's tolerance;Monitored during session;Patient requesting pain meds-RN notified    Home Living                      Prior Function            PT Goals (current goals can now be found in the care plan section) Acute Rehab PT Goals Patient Stated Goal: walk without pain PT Goal Formulation: With patient Time For  Goal Achievement: 02/06/14 Potential to Achieve Goals: Good Progress towards PT goals: Progressing toward goals    Frequency  7X/week    PT Plan Current plan remains appropriate    Co-evaluation             End of Session Equipment Utilized During Treatment: Gait belt;Left knee immobilizer Activity Tolerance: Patient tolerated treatment well Patient left: with call bell/phone within reach;with family/visitor present (in bathroom)     Time: 1624-4695 PT Time Calculation (min)  (ACUTE ONLY): 40 min  Charges:  $Gait Training: 23-37 mins $Therapeutic Exercise: 8-22 mins                    G Codes:      [name] Hamff 02/01/2014, 12:02 PM  [name], [name] Pager [phone] Office [phone]

## 2014-02-01 NOTE — Progress Notes (Signed)
Patient provided with discharge instructions and follow up information. She is going home with HHPT set up through [facility]. Her husband is at bedside and will be patients support.

## 2014-02-01 NOTE — Progress Notes (Signed)
Occupational Therapy Treatment Patient Details Name: Zeenat Jeanbaptiste MRN: [id number] DOB: October 24, 1945 Today's Date: 02/01/2014    History of present illness Pt is a 69 y.o. s/p Lt TKA and MCL repair.    OT comments  Practiced shower transfer. Education provided. Feel pt is safe to d/c home, from OT standpoint, with spouse available to assist.   Follow Up Recommendations  No OT follow up;Supervision - Intermittent    Equipment Recommendations  None recommended by OT    Recommendations for Other Services      Precautions / Restrictions Precautions Precautions: Knee Precaution Booklet Issued: No Precaution Comments: reviewed precautions Required Braces or Orthoses: Knee Immobilizer - Left Knee Immobilizer - Left: On when out of bed or walking Restrictions Weight Bearing Restrictions: Yes LLE Weight Bearing: Partial weight bearing LLE Partial Weight Bearing Percentage or Pounds: 50       Mobility Bed Mobility Overal bed mobility: Modified Independent                Transfers Overall transfer level: Needs assistance Equipment used: Rolling walker (2 wheeled) Transfers: Sit to/from Stand Sit to Stand: Supervision;Min guard         General transfer comment: Min guard for sit <> stand to/from 3 in 1 for simulated shower transfer.         ADL Overall ADL's : Needs assistance/impaired                         Toilet Transfer: Supervision/safety;Ambulation;RW (bed/BSC)       Tub/ Shower Transfer: Min guard;Ambulation;3 in 1;Walk-in shower;Rolling walker   Functional mobility during ADLs: Min guard;Supervision/safety;Rolling walker (Min guard for shower transfer. ) General ADL Comments: Reviewed LB dressing technique. Educated on knee immobilizer. Educated on shower transfer technique and pt practiced simulated shower transfer-recommended spouse be with her at home for this. Educated/reviewed safety tips. Talked about AE/cost. Explained benefit of reaching  to left LE for ADLs as it allows knee to bend.       Vision                     Perception     Praxis      Cognition  Awake/Alert Behavior During Therapy: WFL for tasks assessed/performed Overall Cognitive Status: Within Functional Limits for tasks assessed                       Extremity/Trunk Assessment               Exercises     Shoulder Instructions       General Comments      Pertinent Vitals/ Pain       Pain Assessment: 0-10 Pain Score: 5  Pain Location: left knee/ankle Pain Intervention(s): Monitored during session  Home Living                                          Prior Functioning/Environment              Frequency Min 2X/week     Progress Toward Goals  OT Goals(current goals can now be found in the care plan section)  Progress towards OT goals: Progressing toward goals  Acute Rehab OT Goals Patient Stated Goal: not stated OT Goal Formulation: With patient Time For Goal Achievement: 02/06/14 Potential to Achieve Goals: Good ADL  Goals Pt Will Perform Lower Body Dressing: with modified independence;sit to/from stand Pt Will Transfer to Toilet: with modified independence;ambulating (3 in 1 over commode) Pt Will Perform Tub/Shower Transfer: Shower transfer;ambulating;3 in 1;rolling walker;with supervision  Plan Discharge plan remains appropriate    Co-evaluation                 End of Session Equipment Utilized During Treatment: Gait belt;Rolling walker;Left knee immobilizer CPM Left Knee CPM Left Knee: Off   Activity Tolerance Patient tolerated treatment well   Patient Left in bed;with call bell/phone within reach;with family/visitor present   Nurse Communication          Time: 1021-1173 OT Time Calculation (min): 16 min  Charges: OT General Charges $OT Visit: 1 Procedure OT Treatments $Self Care/Home Management : 8-22 mins  Benito Mccreedy OTR/L [phone] 02/01/2014, 11:31  AM

## 2014-02-01 NOTE — Progress Notes (Signed)
   Subjective: 3 Days Post-Op Procedure(s) (LRB): LEFT TOTAL KNEE ARTHROPLASTY MCL REPAIR (Left)  Pt doing well, yesterday was rough Mild pain in the knee Ready for d/c home today Patient reports pain as mild.  Objective:   VITALS:   Filed Vitals:   02/01/14 0435  BP: 134/63  Pulse: 86  Temp: 98.6 F (37 C)  Resp: 18    Left knee incision healing well nv intact distally No rashes or edema  LABS  Recent Labs  01/30/14 0455 01/31/14 0415 02/01/14 0615  HGB 12.1 10.7* 10.1*  HCT 37.2 32.5* 31.1*  WBC 11.8* 11.6* 11.5*  PLT 263 228 229     Recent Labs  01/30/14 0455  NA 134*  K 4.3  BUN 9  CREATININE 0.80  GLUCOSE 123*     Assessment/Plan: 3 Days Post-Op Procedure(s) (LRB): LEFT TOTAL KNEE ARTHROPLASTY MCL REPAIR (Left) D/c home today F/u in 2 weeks     Merla Riches, MPAS, PA-C  02/01/2014, 9:55 AM

## 2014-02-25 ENCOUNTER — Other Ambulatory Visit: Payer: Self-pay

## 2014-02-25 ENCOUNTER — Other Ambulatory Visit (HOSPITAL_COMMUNITY): Payer: Self-pay | Admitting: Orthopedic Surgery

## 2014-02-25 ENCOUNTER — Ambulatory Visit (HOSPITAL_COMMUNITY)
Admission: RE | Admit: 2014-02-25 | Discharge: 2014-02-25 | Disposition: A | Discharge status: Home / Self Care | Payer: Medicare Other | Source: Ambulatory Visit | Attending: Cardiovascular Disease | Admitting: Cardiovascular Disease

## 2014-02-25 DIAGNOSIS — M79652 Pain in left thigh: Secondary | ICD-10-CM | POA: Insufficient documentation

## 2014-02-25 DIAGNOSIS — Z471 Aftercare following joint replacement surgery: Secondary | ICD-10-CM

## 2014-02-25 DIAGNOSIS — Z96652 Presence of left artificial knee joint: Secondary | ICD-10-CM | POA: Insufficient documentation

## 2014-02-25 DIAGNOSIS — M79662 Pain in left lower leg: Secondary | ICD-10-CM

## 2014-02-25 DIAGNOSIS — M7989 Other specified soft tissue disorders: Secondary | ICD-10-CM

## 2014-02-25 NOTE — Progress Notes (Signed)
Left Lower Ext. Venous Duplex Completed. Negative for DVT or SVT. Jaxston Chohan, BS, RDMS, RVT  

## 2014-03-01 ENCOUNTER — Telehealth (HOSPITAL_COMMUNITY): Payer: Self-pay | Admitting: *Deleted

## 2014-03-22 ENCOUNTER — Telehealth: Payer: Self-pay | Admitting: Critical Care Medicine

## 2014-03-22 NOTE — Telephone Encounter (Signed)
Patient says she has not coughed since she has been taking the Hydrocodone from her knee surgery.  She has not been taking all of her other medications with the medication, she has not needed them.  She cancelled her appointment for tomorrow because she is not very mobile right now due to knee surgery.  She just wanted Dr. Joya Gaskins to be aware that her cough has gone away completely since her surgery.  FYI to Dr. Joya Gaskins

## 2014-03-24 NOTE — Telephone Encounter (Signed)
noted 

## 2014-03-30 ENCOUNTER — Ambulatory Visit: Payer: Medicare Other | Admitting: Critical Care Medicine

## 2014-07-19 ENCOUNTER — Ambulatory Visit (INDEPENDENT_AMBULATORY_CARE_PROVIDER_SITE_OTHER): Payer: Medicare Other | Admitting: Pulmonary Disease

## 2014-07-19 ENCOUNTER — Encounter: Payer: Self-pay | Admitting: Pulmonary Disease

## 2014-07-19 ENCOUNTER — Ambulatory Visit (INDEPENDENT_AMBULATORY_CARE_PROVIDER_SITE_OTHER)
Admission: RE | Admit: 2014-07-19 | Discharge: 2014-07-19 | Disposition: A | Discharge status: Home / Self Care | Payer: Medicare Other | Source: Ambulatory Visit | Attending: Pulmonary Disease | Admitting: Pulmonary Disease

## 2014-07-19 VITALS — BP 130/70 | HR 67 | Temp 97.2°F | Wt 143.8 lb

## 2014-07-19 DIAGNOSIS — J683 Other acute and subacute respiratory conditions due to chemicals, gases, fumes and vapors: Secondary | ICD-10-CM

## 2014-07-19 DIAGNOSIS — R05 Cough: Secondary | ICD-10-CM

## 2014-07-19 DIAGNOSIS — K2100 Gastro-esophageal reflux disease with esophagitis, without bleeding: Secondary | ICD-10-CM

## 2014-07-19 DIAGNOSIS — K449 Diaphragmatic hernia without obstruction or gangrene: Secondary | ICD-10-CM

## 2014-07-19 DIAGNOSIS — R058 Other specified cough: Secondary | ICD-10-CM

## 2014-07-19 DIAGNOSIS — K219 Gastro-esophageal reflux disease without esophagitis: Secondary | ICD-10-CM | POA: Insufficient documentation

## 2014-07-19 DIAGNOSIS — J4540 Moderate persistent asthma, uncomplicated: Secondary | ICD-10-CM

## 2014-07-19 HISTORY — DX: Other acute and subacute respiratory conditions due to chemicals, gases, fumes and vapors: J68.3

## 2014-07-19 HISTORY — DX: Gastro-esophageal reflux disease without esophagitis: K21.9

## 2014-07-19 MED ORDER — HYDROCOD POLST-CPM POLST ER 10-8 MG/5ML PO SUER: 5.0000 mL | Freq: Two times a day (BID) | ORAL | Status: DC | PRN

## 2014-07-19 MED ORDER — BUDESONIDE-FORMOTEROL FUMARATE 160-4.5 MCG/ACT IN AERO: 2.0000 | INHALATION_SPRAY | Freq: Two times a day (BID) | RESPIRATORY_TRACT | Status: DC

## 2014-07-19 NOTE — Patient Instructions (Signed)
Today we updated your med list in our EPIC system...    Continue your current medications the same...  For your throat and cough symptoms>> you need a vigorous antireflux regimen...    Take PANTOPRAZOLE 40mg  about 30 min before the evening meal...    Do not eat or drink much after dinner in the evening...    Elevate the head of your bed about 6" on blocks...    Take PEPCID 40mg  w/ a sip of water about 30 min before bedtime...    Your PCP may need to refer you back to your gastroenterologist of the reflux persists...  For the reactive airways condition>>     Your insurance would not cover Dulera, therefore lets go w/ [id number]- 2 sprays tweice daily via the spacer & rinse well after the treatments...    You may continue the DELSYM 2 tsp every 12H as needed     And try the Markle 1 tsp every 12H as needed for any refractory cough problem...  Today we did a follow up CXR to be sure it remains clear>>    We will contact you w/ the results when available...   You should follow up w/ your PCP in several weeks & let's plan a ROV here in about 6 weeks time.Marland KitchenMarland Kitchen

## 2014-07-19 NOTE — Progress Notes (Addendum)
Subjective:    Patient ID: [name], female    DOB: 1945/07/05, 69 y.o.   MRN: [id number]  HPI  09/29/2013: f/u appt w/ PW>  Chief Complaint  Patient presents with  . Follow-up    c/o increased hoarseness, clearing throat a lot, not able to rest voice much due to company, sob-same,cough-yellow,no fcs, chest heavy ,diarrhea     F/u upper airway cyclical cough, no true asthma Pt was ok until 1.5 week ago: company ill, viral issues.  Now cough is productive clear to white. occ yellow. Notes pn drip severe. Throat is scratchy. Using the drops. 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 Now with acute right maxillary sinusitis with postnasal drip precipitating current cough paroxysms, note right external auditory canal occluded with cerumen    Plan> Need to clear her right ear of cerumen  Take azithromycin 250mg  Take two once then one daily until gone Take prednisone 10mg  Take 4 for two days three for two days two for two days one for two days Stay on flonase nasal spray Stay on chlor trimeton Use saline nasal spray three times daily Follow cough protocol with Delsym         11/24/2013: f/u appt w/ PW> Chief Complaint  Patient presents with  . 2 month follow up    Cough greatly improved while on cough protocol.  Notices cough worsens after decreasing delsym.  Cough prod over the past few days with white to yellow mucus.  No wheezing, chest tightnes, CP, or f/c/s.       Pt got better a while then got worse.  the patient is still on antihistamine and flonase.  Forgets the nasal spray.  Cannot get the delsym off. Got ears cleaned out.  Cough worse past two days yellow mucus.  No nasal d/c.  No postnasal drip. No GERD symptoms.  Pt with diarrhea for 1 year: goes 4-5x per day No wheezing, no dyspnea.     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 disease as a primary precipitating factor Note inhaled  steroids have not previously been of much benefit Acute tracheobronchitis currently    Plan> Stop protonix due to diarrhea Increase acidophilus to twice daily Cefuroxime 500mg  twice daily x 5days Prednisone 10mg  Take 4 for two days three for two days two for two days one for two days Benzonatate 1-2 every 4-6 hours as needed for cough and delsym as needed : per cough protocol Stay on chlorpheniramine/fluticasone USe flutter valve 3 - 4 x daily  ~  July 19, 2014:  35mo ROV & add-on appt w/ SN> her PCP is DrGrisso in [place]    69 y/o WF pt of PW w/ RADS, cough variant asthma, not on any regular inhalers...  Prev CT Chest 05/27/12 at [facility] (they report hx melanoma) showed scarring left lung base 7 36mm nodular component anteriorly is unchanged, no adenopathy, borderline heart size & tort Ao  CT Sinuses 10/23/12 was neg, clear sinuses...   Prev PFT> 06/03/13 showed FVC=2.75 (92%), FEV1=2.04 (89%), %1sec=74, mid-flows min reduced at 72% predicted; METHACHOLINE challenge was NEG...  Last CXR> 02/12/13 showed norm heart size, clear lungs, mild DD Tspine...  She had left TKR 04/3152 w/ complications => required hydrocodone for pain & she notes cough resolved (note- she tells me she is due to have a screw removed);  She presents on this occas w/ 52mo hx cough, mostly dry, raspy/ scratchy throat,  min clear whitish sput, no hemoptysis, only SOB occurs w/ the coughing paraoxysms & the cough was worse at night- assoc w/ reflux symptoms & she reports Hx HH surg by [facility] in [facility] 02/2012 but notes that "it's back" and may need GI eval...     EXAM reveals Afeb, VSS, O2sat=99% on RA;  HEENT- neg, mallampati2;  Chest- clear x dry cough, weak raspy voice;  Heart- RR w/o m/r/g;  Abd- soft, nontender;  Ext- w/o c/c/e...  CXR today 07/19/14 showed clear lungs, no infiltrates etc... IMP/PLAN>>  Lexiana has RADS/ AB with prob nocturnal reflux trigger and we discussed a vigorous antireflux regimen- Protonix40  before dinner, NPO after dinner, elev HOB, Pepcid40 qhs;  In addition- trial Dulera200 (not covered, therefore go w/ SYMBICORT160- 2spBid w/ spacer, plus Tussionex, etc...  ROV recheck in 6-8 weeks...    Past Medical History  Diagnosis Date  . Melanoma   . Endometriosis   . Hyperlipidemia   . Asthma   . 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 Cough     chronic, pt. reports for 3.5 yrs.   . Complication of anesthesia   . Atrial fibrillation     pt. reports that [facility] that she had a "slight afib.", no further test needed (01/07/14 cardiology note does not mention any afib, just poor anterior r wave progression)  . OSA (obstructive sleep apnea)     does not tolerate CPAP , pt. reports that its severe, but doesn't use it anymore, since weight loss.   Study done in [place], not sure where.   . Anxiety     antianxiety med. usually at night but can take during the day for anxiety  . GERD (gastroesophageal reflux disease)   . History of hiatal hernia     pt. has been told that the hiatal hernia has reappeared  . Arthritis     both knees & hands  also the back & neck  . Diarrhea     pt. reports that she has irritable bowel, loose stool & diarrhea both about 6 times per day, right after she eats.  . Hypertension     pt. reports that she use to take antihtn med, but reports since weight loss she has been able to come off.    Marland Kitchen PONV (postoperative nausea and vomiting)     Past Surgical History  Procedure Laterality Date  . Appendectomy    . Tonsillectomy    . Vesicovaginal fistula closure w/ tah    . Gallbladder surgery    . Hiatal hernia repair  2/14  . Cholecystectomy    . Abdominal hysterectomy    . Oophorectomy    . Knee arthroscopy      both knees for torn cartilage   . Eye surgery      cataracts removed / w IOL  . 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 acetaminophen (TYLENOL) 325 MG tablet Take 650 mg by mouth every 6 (six) hours as needed for headache. Per bottle as needed for headache  . alendronate (FOSAMAX) 70 MG tablet TAKE 1 TABLET ONCE WEEKLY.  Marland Kitchen aspirin EC 81 MG tablet Take 81 mg by mouth daily as needed (pain).  Marland Kitchen HYDROcodone-acetaminophen (NORCO) 5-325 MG per tablet Take 1 tablet by  mouth every 6 (six) hours as needed for moderate pain.  . Lactobacillus (ACIDOPHILUS) CAPS capsule Take 1 capsule by mouth every morning.   Marland Kitchen levothyroxine (SYNTHROID, LEVOTHROID) 88 MCG tablet Take 88 mcg by mouth daily before breakfast.  . LORazepam (ATIVAN) 0.5 MG tablet Take 0.5 mg by mouth every 6 (six) hours as needed for anxiety or sleep.   . methocarbamol (ROBAXIN) 500 MG tablet Take 1 tablet (500 mg total) by mouth 3 (three) times daily as needed.  . pantoprazole (PROTONIX) 40 MG tablet 1 tablet daily.  . promethazine (PHENERGAN) 12.5 MG tablet Take 12.5 mg by mouth every 6 (six) hours as needed for nausea or vomiting.  . sertraline (ZOLOFT) 50 MG tablet TAKE 1 TABLET DAILY AS DIRECTED.  Marland Kitchen benzonatate (TESSALON) 100 MG capsule Take 1-2 every 4-6 hours as needed for cough (Patient not taking: Reported on 07/19/2014)  . chlorpheniramine (CHLOR-TRIMETON) 4 MG tablet 12mg  at bedtime (Patient not taking: Reported on 07/19/2014)  . Cholecalciferol (VITAMIN D) 2000 UNITS CAPS Take 1 capsule by mouth every morning.   . ciclopirox (PENLAC) 8 % solution Apply 1 application topically at bedtime. Apply over nail and surrounding skin. Apply daily over previous coat. After seven (7) days, may remove with alcohol and continue cycle.  Marland Kitchen dextromethorphan (DELSYM) 30 MG/5ML liquid Take 2 tsp every 6  hours as needed for cough (Patient not taking: Reported on 07/19/2014)  . famotidine (PEPCID) 40 MG tablet Take 1 tablet (40 mg total) by mouth at bedtime. (Patient not taking:  Reported on 07/19/2014)  . fluticasone (FLONASE) 50 MCG/ACT nasal spray Place 2 sprays into both nostrils 2 (two) times daily. (Patient not taking: Reported on 07/19/2014)  . HYDROmorphone (DILAUDID) 2 MG tablet Take 0.5-1 tablets (1-2 mg total) by mouth every 4 (four) hours as needed for severe pain. (Patient not taking: Reported on 07/19/2014)  . Magnesium 250 MG TABS Take 1 tablet by mouth every other day. Take 1 tablet every other day  . ondansetron (ZOFRAN) 4 MG tablet Take 1 tablet (4 mg total) by mouth every 6 (six) hours as needed for nausea. (Patient not taking: Reported on 07/19/2014)  . Respiratory Therapy Supplies (FLUTTER) DEVI Use as directed (Patient not taking: Reported on 07/19/2014)  . warfarin (COUMADIN) 5 MG tablet Take 1 tablet (5 mg total) by mouth daily. (Patient not taking: Reported on 07/19/2014)    Allergies  Allergen Reactions  . Neurontin [Gabapentin] Other (See Comments)    Severe falls   . Codeine Nausea And Vomiting  . Oxycodone Nausea And Vomiting  . Tramadol     REACTION: GI discomfort with high doses  . Cyclobenzaprine Palpitations  . Sulfa Antibiotics Rash    Current Medications, Allergies, Past Medical History, Past Surgical History, Family History, and Social History were reviewed in [employer] record.   Review of Systems  Constitutional:   No  weight loss, night sweats,  Fevers, chills, ++fatigue, lassitude. HEENT:   No headaches,  Difficulty swallowing,  Tooth/dental problems,  +++Sore throat,                No sneezing, itching, ear ache, nasal congestion,+++ post nasal drip,   CV:  No chest pain,  Orthopnea, PND, swelling in lower extremities, anasarca, dizziness, palpitations  GI  No heartburn, indigestion, abdominal pain, nausea, vomiting, diarrhea, change in bowel habits, loss of appetite  Resp: No shortness of breath with exertion or at rest.  No excess mucus, notes  productive cough,  Notes non-productive cough,  No  coughing up of blood.  No change in color of mucus.  No wheezing.  No chest wall deformity  Skin: no rash or lesions.  GU: no dysuria, change in color of urine, no urgency or frequency.  No flank pain.  MS:  No joint pain or swelling.  No decreased range of motion.  No back pain.  Psych:  No change in mood or affect. No depression or anxiety.  No memory loss.     Objective:   Physical Exam  Filed Vitals:   07/19/14 1020  BP: 130/70  Pulse: 67  Temp: 97.2 F (36.2 C)  TempSrc: Oral  Weight: 143 lb 12.8 oz (65.227 kg)  SpO2: 99%    Gen: Pleasant, well-nourished, in no distress,  normal affect  ENT: No lesions,  mouth clear,  oropharynx clear, prominent postnasal drip,   Neck: No JVD, no TMG, no carotid bruits  Lungs: No use of accessory muscles, no dullness to percussion, clear w/o w/r/r but weak voice, raspy cough...  Cardiovascular: RRR, heart sounds normal, no murmur or gallops, no peripheral edema  Abdomen: soft and NT, no HSM,  BS normal  Musculoskeletal: No deformities, no cyanosis or clubbing  Neuro: alert, non focal  Skin: Warm, no lesions or rashes  No results found.      Assessment & Plan:    IMP/PLAN >>   RADS ==> Start rx w/ ICS/LABA (Dulera not covered, therefore use SYMBICORT160-2spBid via spacer) Hx Cough variant asthma ==> ok to use DELSYM, TESSALON, Rx for TUSSIONEX Q12h prn... Laryngopharyngeal reflux ==> she needs vigorous antireflux regimen (see AVS) w/ PPI, NPO after dinner, H2blocker at bedtime, elev HOB etc... HH/ GERD ==> may need further GI eval by her gastroenterologist, if not responding...   Mallery has RADS/ AB with prob nocturnal reflux trigger and we discussed a vigorous antireflux regimen- Protonix40 before dinner, NPO after dinner, elev HOB, Pepcid40 qhs;  In addition- trial Dulera200 (not covered, therefore go w/ SYMBICORT160- 2spBid w/ spacer, plus Tussionex, etc   Patient's Medications  New Prescriptions    BUDESONIDE-FORMOTEROL (SYMBICORT) 160-4.5 MCG/ACT INHALER    Inhale 2 puffs into the lungs 2 (two) times daily.   CHLORPHENIRAMINE-HYDROCODONE (TUSSIONEX PENNKINETIC ER) 10-8 MG/5ML SUER    Take 5 mLs by mouth every 12 (twelve) hours as needed for cough.  Previous Medications   ACETAMINOPHEN (TYLENOL) 325 MG TABLET    Take 650 mg by mouth every 6 (six) hours as needed for headache. Per bottle as needed for headache   ALENDRONATE (FOSAMAX) 70 MG TABLET    TAKE 1 TABLET ONCE WEEKLY.   ASPIRIN EC 81 MG TABLET    Take 81 mg by mouth daily as needed (pain).   BENZONATATE (TESSALON) 100 MG CAPSULE    Take 1-2 every 4-6 hours as needed for cough   CHLORPHENIRAMINE (CHLOR-TRIMETON) 4 MG TABLET    12mg  at bedtime   CHOLECALCIFEROL (VITAMIN D) 2000 UNITS CAPS    Take 1 capsule by mouth every morning.    CICLOPIROX (PENLAC) 8 % SOLUTION    Apply 1 application topically at bedtime. Apply over nail and surrounding skin. Apply daily over previous coat. After seven (7) days, may remove with alcohol and continue cycle.   DEXTROMETHORPHAN (DELSYM) 30 MG/5ML LIQUID    Take 2 tsp every 6  hours as needed for cough   FAMOTIDINE (PEPCID) 40 MG TABLET    Take 1 tablet (40 mg total) by mouth at bedtime.  FLUTICASONE (FLONASE) 50 MCG/ACT NASAL SPRAY    Place 2 sprays into both nostrils 2 (two) times daily.   HYDROCODONE-ACETAMINOPHEN (NORCO) 5-325 MG PER TABLET    Take 1 tablet by mouth every 6 (six) hours as needed for moderate pain.   HYDROMORPHONE (DILAUDID) 2 MG TABLET    Take 0.5-1 tablets (1-2 mg total) by mouth every 4 (four) hours as needed for severe pain.   LACTOBACILLUS (ACIDOPHILUS) CAPS CAPSULE    Take 1 capsule by mouth every morning.    LEVOTHYROXINE (SYNTHROID, LEVOTHROID) 88 MCG TABLET    Take 88 mcg by mouth daily before breakfast.   LORAZEPAM (ATIVAN) 0.5 MG TABLET    Take 0.5 mg by mouth every 6 (six) hours as needed for anxiety or sleep.    MAGNESIUM 250 MG TABS    Take 1 tablet by mouth every other  day. Take 1 tablet every other day   METHOCARBAMOL (ROBAXIN) 500 MG TABLET    Take 1 tablet (500 mg total) by mouth 3 (three) times daily as needed.   ONDANSETRON (ZOFRAN) 4 MG TABLET    Take 1 tablet (4 mg total) by mouth every 6 (six) hours as needed for nausea.   PANTOPRAZOLE (PROTONIX) 40 MG TABLET    1 tablet daily.   PROMETHAZINE (PHENERGAN) 12.5 MG TABLET    Take 12.5 mg by mouth every 6 (six) hours as needed for nausea or vomiting.   RESPIRATORY THERAPY SUPPLIES (FLUTTER) DEVI    Use as directed   SERTRALINE (ZOLOFT) 50 MG TABLET    TAKE 1 TABLET DAILY AS DIRECTED.   WARFARIN (COUMADIN) 5 MG TABLET    Take 1 tablet (5 mg total) by mouth daily.  Modified Medications   No medications on file  Discontinued Medications   No medications on file

## 2014-07-28 ENCOUNTER — Telehealth: Payer: Self-pay | Admitting: Pulmonary Disease

## 2014-07-28 NOTE — Telephone Encounter (Signed)
I called spoke with pt and made aware we currently do not have any symbicort samples at this time. She verbalized understanding and will try call back next week. Nothing further needed

## 2014-08-21 ENCOUNTER — Other Ambulatory Visit: Payer: Self-pay | Admitting: Internal Medicine

## 2014-08-30 ENCOUNTER — Ambulatory Visit: Payer: Medicare Other | Admitting: Pulmonary Disease

## 2014-09-15 ENCOUNTER — Encounter: Payer: Self-pay | Admitting: Pulmonary Disease

## 2014-09-15 ENCOUNTER — Ambulatory Visit (INDEPENDENT_AMBULATORY_CARE_PROVIDER_SITE_OTHER): Payer: Medicare Other | Admitting: Pulmonary Disease

## 2014-09-15 VITALS — BP 120/80 | HR 76 | Temp 97.6°F | Wt 147.8 lb

## 2014-09-15 DIAGNOSIS — J4530 Mild persistent asthma, uncomplicated: Secondary | ICD-10-CM

## 2014-09-15 DIAGNOSIS — J683 Other acute and subacute respiratory conditions due to chemicals, gases, fumes and vapors: Secondary | ICD-10-CM

## 2014-09-15 DIAGNOSIS — J387 Other diseases of larynx: Secondary | ICD-10-CM | POA: Diagnosis not present

## 2014-09-15 DIAGNOSIS — K219 Gastro-esophageal reflux disease without esophagitis: Secondary | ICD-10-CM

## 2014-09-15 MED ORDER — FLUTICASONE FUROATE-VILANTEROL 100-25 MCG/INH IN AEPB: 1.0000 | INHALATION_SPRAY | Freq: Every day | RESPIRATORY_TRACT | Status: DC

## 2014-09-15 NOTE — Progress Notes (Signed)
Subjective:    Patient ID: [name], female    DOB: 01-24-1945, 69 y.o.   MRN: [id number]  HPI  09/29/2013: f/u appt w/ PW>  Chief Complaint  Patient presents with  . Follow-up    c/o increased hoarseness, clearing throat a lot, not able to rest voice much due to company, sob-same,cough-yellow,no fcs, chest heavy ,diarrhea     F/u upper airway cyclical cough, no true asthma Pt was ok until 1.5 week ago: company ill, viral issues.  Now cough is productive clear to white. occ yellow. Notes pn drip severe. Throat is scratchy. Using the drops. 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 Now with acute right maxillary sinusitis with postnasal drip precipitating current cough paroxysms, note right external auditory canal occluded with cerumen    Plan> Need to clear her right ear of cerumen  Take azithromycin 250mg  Take two once then one daily until gone Take prednisone 10mg  Take 4 for two days three for two days two for two days one for two days Stay on flonase nasal spray Stay on chlor trimeton Use saline nasal spray three times daily Follow cough protocol with Delsym         11/24/2013: f/u appt w/ PW> Chief Complaint  Patient presents with  . 2 month follow up    Cough greatly improved while on cough protocol.  Notices cough worsens after decreasing delsym.  Cough prod over the past few days with white to yellow mucus.  No wheezing, chest tightnes, CP, or f/c/s.       Pt got better a while then got worse.  the patient is still on antihistamine and flonase.  Forgets the nasal spray.  Cannot get the delsym off. Got ears cleaned out.  Cough worse past two days yellow mucus.  No nasal d/c.  No postnasal drip. No GERD symptoms.  Pt with diarrhea for 1 year: goes 4-5x per day No wheezing, no dyspnea.     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 disease as a primary precipitating factor Note inhaled  steroids have not previously been of much benefit Acute tracheobronchitis currently    Plan> Stop protonix due to diarrhea Increase acidophilus to twice daily Cefuroxime 500mg  twice daily x 5days Prednisone 10mg  Take 4 for two days three for two days two for two days one for two days Benzonatate 1-2 every 4-6 hours as needed for cough and delsym as needed : per cough protocol Stay on chlorpheniramine/fluticasone USe flutter valve 3 - 4 x daily  ~  July 19, 2014:  10mo ROV & add-on appt w/ SN> her PCP is DrGrisso in [place]    69 y/o WF pt of PW w/ RADS, cough variant asthma, not on any regular inhalers...  Prev CT Chest 05/27/12 at [facility] (they report hx melanoma) showed scarring left lung base 7-55mm nodular component anteriorly is unchanged, no adenopathy, borderline heart size & tort Ao  CT Sinuses 10/23/12 was neg, clear sinuses...   Prev PFT> 06/03/13 showed FVC=2.75 (92%), FEV1=2.04 (89%), %1sec=74, mid-flows min reduced at 72% predicted; METHACHOLINE challenge was NEG...  Last CXR> 02/12/13 showed norm heart size, clear lungs, mild DD Tspine...  She had left TKR 09/6293 w/ complications => required hydrocodone for pain & she notes cough resolved (note- she tells me she is due to have a screw removed);  She presents on this occas w/ 4mo hx cough, mostly dry, raspy/ scratchy throat, min  clear whitish sput, no hemoptysis, only SOB occurs w/ the coughing paraoxysms & the cough was worse at night- assoc w/ reflux symptoms & she reports Hx HH surg by [facility] in [facility] 02/2012 but notes that "it's back" and may need GI eval...     EXAM reveals Afeb, VSS, O2sat=99% on RA;  HEENT- neg, mallampati2;  Chest- clear x dry cough, weak raspy voice;  Heart- RR w/o m/r/g;  Abd- soft, nontender;  Ext- w/o c/c/e...  CXR today 07/19/14 showed clear lungs, no infiltrates etc... IMP/PLAN>>  [name] has RADS/ AB with prob nocturnal reflux trigger and we discussed a vigorous antireflux regimen- Protonix40  before dinner, NPO after dinner, elev HOB, Pepcid40 qhs;  In addition- trial Dulera200 (not covered, therefore go w/ SYMBICORT160- 2spBid w/ spacer, plus Tussionex, etc...  ROV recheck in 6-8 weeks...  ~  September 15, 2014:  55mo ROV w/ SN>  [name] stopped the Symbicort due to price & never filled the Tussionex, but she has followed a vigorous antireflux regimen w/ Protonix40 before dinner, NPO after dinner, elev HOB 6", & Pepcid40 before bed w/sip; she notes that her cough is improved, no sput/ no blood/ denies SOB/ CP/ etc & her voice is better too;  She has prev tried Advair as well & it didn't seem to help much she says;  We discussed continuing the vigorous antireflux regimen & try BREO one inhalation daily- asked to get a copy of her prescription drug formulary for [place] to review... She reports having surg on her left heel w/ a melanoma removed by [facility], she tells me that "they got it all" and the kind that she had doesn't spread internally so she doesn't need further surg or referral to [facility] (no data avil to [place] here in [place])...  EXAM reveals Afeb, VSS, O2sat=98% on RA;  HEENT- neg, mallampati2;  Chest- clear w/o w/r/r;  Heart- RR w/o m/r/g;  Abd- soft, nontender;  Ext- w/o c/c/e...    RADS ==> try a diff ICS/LABA in BREO one inhalation daily & she will get copy of her Formulary...    Hx Cough variant asthma ==> ok to use DELSYM, TESSALON, MUCINEX OTC as needed...    Laryngopharyngeal reflux ==> she will continue vigorous antireflux regimen w/ PPI, NPO after dinner, H2blocker at bedtime, elev HOB etc...    HH/ GERD ==> may need further GI eval by her gastroenterologist, if not responding...  We reviewed prob list, meds, xrays and labs> she'll get 2016 Flu vaccine from her PCP... IMP/PLAN>>  [name] is improved on her vigorous antireflux regimen & she will continue to apply this every night;  We decided to try [facility] & she will get a copy of her prescription drug formulary fior [place] to review;  ROV  in 40mo and sooner if needed prn...    Past Medical History  Diagnosis Date  . Melanoma   . Endometriosis   . Hyperlipidemia   . Asthma   . 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 Cough     chronic, pt. reports for 3.5 yrs.   . Complication of anesthesia   . Atrial fibrillation     pt. reports that [facility] that she had a "slight afib.", no further test needed (01/07/14 cardiology note does not mention any afib, just poor anterior r wave progression)  . OSA (obstructive sleep apnea)     does not tolerate CPAP , pt. reports that its severe, but doesn't use it anymore, since weight loss.  Study done in [place], not sure where.   . Anxiety     antianxiety med. usually at night but can take during the day for anxiety  . GERD (gastroesophageal reflux disease)   . History of hiatal hernia     pt. has been told that the hiatal hernia has reappeared  . Arthritis     both knees & hands  also the back & neck  . Diarrhea     pt. reports that she has irritable bowel, loose stool & diarrhea both about 6 times per day, right after she eats.  . Hypertension     pt. reports that she use to take antihtn med, but reports since weight loss she has been able to come off.    Marland Kitchen PONV (postoperative nausea and vomiting)     Past Surgical History  Procedure Laterality Date  . Appendectomy    . Tonsillectomy    . Vesicovaginal fistula closure w/ tah    . Gallbladder surgery    . Hiatal hernia repair  2/14  . Cholecystectomy    . Abdominal hysterectomy    . Oophorectomy    . Knee arthroscopy      both knees for torn cartilage   . Eye surgery      cataracts removed / w IOL  . 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 acetaminophen (TYLENOL) 325 MG tablet Take 650 mg by mouth every 6 (six)  hours as needed for headache. Per bottle as needed for headache  . alendronate (FOSAMAX) 70 MG tablet TAKE 1 TABLET ONCE WEEKLY.  Marland Kitchen aspirin EC 81 MG tablet Take 81 mg by mouth daily as needed (pain).  Marland Kitchen HYDROcodone-acetaminophen (NORCO) 5-325 MG per tablet Take 1 tablet by mouth every 6 (six) hours as needed for moderate pain.  . Lactobacillus (ACIDOPHILUS) CAPS capsule Take 1 capsule by mouth every morning.   Marland Kitchen levothyroxine (SYNTHROID, LEVOTHROID) 88 MCG tablet Take 88 mcg by mouth daily before breakfast.  . LORazepam (ATIVAN) 0.5 MG tablet Take 0.5 mg by mouth every 6 (six) hours as needed for anxiety or sleep.   . methocarbamol (ROBAXIN) 500 MG tablet Take 1 tablet (500 mg total) by mouth 3 (three) times daily as needed.  . pantoprazole (PROTONIX) 40 MG tablet 1 tablet daily.  . promethazine (PHENERGAN) 12.5 MG tablet Take 12.5 mg by mouth every 6 (six) hours as needed for nausea or vomiting.  . sertraline (ZOLOFT) 50 MG tablet TAKE 1 TABLET DAILY AS DIRECTED.  Marland Kitchen benzonatate (TESSALON) 100 MG capsule Take 1-2 every 4-6 hours as needed for cough (Patient not taking: Reported on 07/19/2014)  . chlorpheniramine (CHLOR-TRIMETON) 4 MG tablet 12mg  at bedtime (Patient not taking: Reported on 07/19/2014)  . Cholecalciferol (VITAMIN D) 2000 UNITS CAPS Take 1 capsule by mouth every morning.   . ciclopirox (PENLAC) 8 % solution Apply 1 application topically at bedtime. Apply over nail and surrounding skin. Apply daily over previous coat. After seven (7) days, may remove with alcohol and continue cycle.  Marland Kitchen dextromethorphan (DELSYM) 30 MG/5ML liquid Take 2 tsp every 6  hours as needed for cough (Patient not taking: Reported on 07/19/2014)  . famotidine (PEPCID) 40 MG tablet Take 1 tablet (40 mg total) by mouth at bedtime. (Patient not taking: Reported  on 07/19/2014)  . fluticasone (FLONASE) 50 MCG/ACT nasal spray Place 2 sprays into both nostrils 2 (two) times daily. (Patient not taking: Reported on 07/19/2014)    . HYDROmorphone (DILAUDID) 2 MG tablet Take 0.5-1 tablets (1-2 mg total) by mouth every 4 (four) hours as needed for severe pain. (Patient not taking: Reported on 07/19/2014)  . Magnesium 250 MG TABS Take 1 tablet by mouth every other day. Take 1 tablet every other day  . ondansetron (ZOFRAN) 4 MG tablet Take 1 tablet (4 mg total) by mouth every 6 (six) hours as needed for nausea. (Patient not taking: Reported on 07/19/2014)  . Respiratory Therapy Supplies (FLUTTER) DEVI Use as directed (Patient not taking: Reported on 07/19/2014)  . warfarin (COUMADIN) 5 MG tablet Take 1 tablet (5 mg total) by mouth daily. (Patient not taking: Reported on 07/19/2014)    Allergies  Allergen Reactions  . Neurontin [Gabapentin] Other (See Comments)    Severe falls   . Codeine Nausea And Vomiting  . Oxycodone Nausea And Vomiting  . Tramadol     REACTION: GI discomfort with high doses  . Cyclobenzaprine Palpitations  . Sulfa Antibiotics Rash    Current Medications, Allergies, Past Medical History, Past Surgical History, Family History, and Social History were reviewed in [employer] record.   Review of Systems  Constitutional:   No  weight loss, night sweats,  Fevers, chills, ++fatigue, lassitude. HEENT:   No headaches,  Difficulty swallowing,  Tooth/dental problems,  +++Sore throat,                No sneezing, itching, ear ache, nasal congestion,+++ post nasal drip,   CV:  No chest pain,  Orthopnea, PND, swelling in lower extremities, anasarca, dizziness, palpitations  GI  No heartburn, indigestion, abdominal pain, nausea, vomiting, diarrhea, change in bowel habits, loss of appetite  Resp: No shortness of breath with exertion or at rest.  No excess mucus, notes  productive cough,  Notes non-productive cough,  No coughing up of blood.  No change in color of mucus.  No wheezing.  No chest wall deformity  Skin: no rash or lesions.  GU: no dysuria, change in color of urine, no  urgency or frequency.  No flank pain.  MS:  No joint pain or swelling.  No decreased range of motion.  No back pain.  Psych:  No change in mood or affect. No depression or anxiety.  No memory loss.     Objective:   Physical Exam  Filed Vitals:   09/15/14 1134  BP: 120/80  Pulse: 76  Temp: 97.6 F (36.4 C)  TempSrc: Oral  Weight: 147 lb 12.8 oz (67.042 kg)  SpO2: 98%    Gen: Pleasant, well-nourished, in no distress,  normal affect  ENT: No lesions,  mouth clear,  oropharynx clear, prominent postnasal drip,   Neck: No JVD, no TMG, no carotid bruits  Lungs: No use of accessory muscles, no dullness to percussion, clear w/o w/r/r and voice is improved back to nl.  Cardiovascular: RRR, heart sounds normal, no murmur or gallops, no peripheral edema  Abdomen: soft and NT, no HSM,  BS normal  Musculoskeletal: No deformities, no cyanosis or clubbing  Neuro: alert, non focal  Skin: Warm, no lesions or rashes     Assessment & Plan:    IMP/PLAN >>     RADS ==> try a diff ICS/LABA in BREO one inhalation daily & she will get copy of her Formulary.Marland KitchenMarland Kitchen  Hx Cough variant asthma ==> ok to use DELSYM, TESSALON, MUCINEX OTC as needed...    Laryngopharyngeal reflux ==> she will continue vigorous antireflux regimen w/ PPI, NPO after dinner, H2blocker at bedtime, elev HOB etc...    HH/ GERD ==> may need further GI eval by her gastroenterologist, if not responding...   [name] is improved on her vigorous antireflux regimen & she will continue to apply this every night;  We decided to try [facility] & she will get a copy of her prescription drug formulary fior [place] to review;  ROV in 37mo and sooner if needed prn...   Patient's Medications  New Prescriptions   FLUTICASONE FUROATE-VILANTEROL (BREO ELLIPTA) 100-25 MCG/INH AEPB    Inhale 1 puff into the lungs daily.  Previous Medications   ACETAMINOPHEN (TYLENOL) 325 MG TABLET    Take 650 mg by mouth every 6 (six) hours as needed for headache. Per  bottle as needed for headache   ALENDRONATE (FOSAMAX) 70 MG TABLET    TAKE 1 TABLET ONCE WEEKLY.   ASPIRIN EC 81 MG TABLET    Take 81 mg by mouth daily as needed (pain).   BENZONATATE (TESSALON) 100 MG CAPSULE    Take 1-2 every 4-6 hours as needed for cough   CHOLECALCIFEROL (VITAMIN D) 2000 UNITS CAPS    Take 1 capsule by mouth every morning.    CICLOPIROX (PENLAC) 8 % SOLUTION    Apply 1 application topically at bedtime. Apply over nail and surrounding skin. Apply daily over previous coat. After seven (7) days, may remove with alcohol and continue cycle.   DEXTROMETHORPHAN (DELSYM) 30 MG/5ML LIQUID    Take 2 tsp every 6  hours as needed for cough   FAMOTIDINE (PEPCID) 40 MG TABLET    Take 1 tablet (40 mg total) by mouth at bedtime.   FLUTICASONE (FLONASE) 50 MCG/ACT NASAL SPRAY    Place 2 sprays into both nostrils 2 (two) times daily.   HYDROCODONE-ACETAMINOPHEN (NORCO) 5-325 MG PER TABLET    Take 1 tablet by mouth every 6 (six) hours as needed for moderate pain.   HYDROMORPHONE (DILAUDID) 2 MG TABLET    Take 0.5-1 tablets (1-2 mg total) by mouth every 4 (four) hours as needed for severe pain.   LACTOBACILLUS (ACIDOPHILUS) CAPS CAPSULE    Take 1 capsule by mouth every morning.    LEVOTHYROXINE (SYNTHROID, LEVOTHROID) 88 MCG TABLET    Take 88 mcg by mouth daily before breakfast.   LORAZEPAM (ATIVAN) 0.5 MG TABLET    Take 0.5 mg by mouth every 6 (six) hours as needed for anxiety or sleep.    MAGNESIUM 250 MG TABS    Take 1 tablet by mouth every other day. Take 1 tablet every other day   METHOCARBAMOL (ROBAXIN) 500 MG TABLET    Take 1 tablet (500 mg total) by mouth 3 (three) times daily as needed.   ONDANSETRON (ZOFRAN) 4 MG TABLET    Take 1 tablet (4 mg total) by mouth every 6 (six) hours as needed for nausea.   PANTOPRAZOLE (PROTONIX) 40 MG TABLET    1 tablet daily.   PROMETHAZINE (PHENERGAN) 12.5 MG TABLET    Take 12.5 mg by mouth every 6 (six) hours as needed for nausea or vomiting.    RESPIRATORY THERAPY SUPPLIES (FLUTTER) DEVI    Use as directed   SERTRALINE (ZOLOFT) 50 MG TABLET    TAKE 1 TABLET DAILY AS DIRECTED.   WARFARIN (COUMADIN) 5 MG TABLET    Take  1 tablet (5 mg total) by mouth daily.  Modified Medications   No medications on file  Discontinued Medications   PANTOPRAZOLE (PROTONIX) 40 MG TABLET    TAKE 1 TABLET EVERY DAY BEFORE BREAKFAST

## 2014-09-15 NOTE — Patient Instructions (Signed)
Today we updated your med list in our EPIC system...    Continue your current medications the same...  We decided to try BREO-- one inhalation once daily...  Stay diligent regarding your ANTIREFLUX regimen...  As your insurance company for a copy of their [place]    and bring it to all your doctor visits...  Remember to get your 2016 Flu vaccine from your PCP soon...  Call for any questions...  Let's plan a follow up visit in about 45mo, sooner if needed for any breathing problems.Marland KitchenMarland Kitchen

## 2014-09-27 ENCOUNTER — Telehealth: Payer: Self-pay | Admitting: Pulmonary Disease

## 2014-09-27 NOTE — Telephone Encounter (Signed)
Attempted to call pt. No answer, no option to leave a message. Will try back. 

## 2014-09-28 NOTE — Telephone Encounter (Signed)
atc pt, no answer, no vm.  Wcb.  

## 2014-09-29 NOTE — Telephone Encounter (Signed)
[phone], pt cb

## 2014-09-29 NOTE — Telephone Encounter (Signed)
Called # provided and received message the number has been changed, D/C'D or no longer in service Called home # and LMTCB x1

## 2014-09-29 NOTE — Telephone Encounter (Signed)
lmomtcb x1 

## 2014-09-30 NOTE — Telephone Encounter (Signed)
Spoke with the pt  She states that she trie to get Breo rx filled and it was too expensive  She checked with her ins and advair is preferred and will only cost 5 $  She has tried advair in the past and does not feel that it helped much, but she states the same for breo  Please advise recs thanks

## 2014-10-04 MED ORDER — FLUTICASONE-SALMETEROL 115-21 MCG/ACT IN AERO: 2.0000 | INHALATION_SPRAY | Freq: Two times a day (BID) | RESPIRATORY_TRACT | Status: DC

## 2014-10-04 NOTE — Telephone Encounter (Signed)
Per SN: Try the Advair hfa 162mcg 2 puff bid.  lmtcb X1 to make pt aware.

## 2014-10-04 NOTE — Telephone Encounter (Signed)
Atc, line rang twice then said "the number you're trying to reach is not in service". atc again, fast busy signal. Wcb.

## 2014-10-04 NOTE — Telephone Encounter (Signed)
Called and spoke with pt Informed pt that Advair 115 HFA would be called in to replace Breo Pt voiced understanding of instructions  Nothing further is needed

## 2014-10-04 NOTE — Telephone Encounter (Signed)
lmtcb for pt.  

## 2014-10-04 NOTE — Telephone Encounter (Signed)
Pt returning call and can be reached @ [phone] hm  or [phone].[name]

## 2014-10-04 NOTE — Telephone Encounter (Signed)
Pt returning call and can be reached @ [phone].[name]

## 2015-01-17 ENCOUNTER — Encounter: Payer: Self-pay | Admitting: Pulmonary Disease

## 2015-01-17 ENCOUNTER — Ambulatory Visit (INDEPENDENT_AMBULATORY_CARE_PROVIDER_SITE_OTHER): Payer: Medicare Other | Admitting: Pulmonary Disease

## 2015-01-17 VITALS — BP 120/70 | HR 78 | Temp 98.1°F | Wt 142.6 lb

## 2015-01-17 DIAGNOSIS — J683 Other acute and subacute respiratory conditions due to chemicals, gases, fumes and vapors: Secondary | ICD-10-CM

## 2015-01-17 DIAGNOSIS — J4530 Mild persistent asthma, uncomplicated: Secondary | ICD-10-CM

## 2015-01-17 DIAGNOSIS — J387 Other diseases of larynx: Secondary | ICD-10-CM

## 2015-01-17 DIAGNOSIS — K449 Diaphragmatic hernia without obstruction or gangrene: Secondary | ICD-10-CM

## 2015-01-17 DIAGNOSIS — K219 Gastro-esophageal reflux disease without esophagitis: Secondary | ICD-10-CM

## 2015-01-17 MED ORDER — METHYLPREDNISOLONE 4 MG PO TABS: ORAL_TABLET | ORAL | Status: DC

## 2015-01-17 MED ORDER — METHYLPREDNISOLONE ACETATE 80 MG/ML IJ SUSP
80.0000 mg | Freq: Once | INTRAMUSCULAR | Status: AC
  Administered 2015-01-17: 80 mg via INTRAMUSCULAR

## 2015-01-17 MED ORDER — FLUTICASONE FUROATE-VILANTEROL 100-25 MCG/INH IN AEPB: 1.0000 | INHALATION_SPRAY | Freq: Every day | RESPIRATORY_TRACT | Status: DC

## 2015-01-17 NOTE — Progress Notes (Signed)
Subjective:    Patient ID: [name], female    DOB: 01-24-1945, 70 y.o.   MRN: [id number]  HPI  09/29/2013: f/u appt w/ PW>  Chief Complaint  Patient presents with  . Follow-up    c/o increased hoarseness, clearing throat a lot, not able to rest voice much due to company, sob-same,cough-yellow,no fcs, chest heavy ,diarrhea     F/u upper airway cyclical cough, no true asthma Pt was ok until 1.5 week ago: company ill, viral issues.  Now cough is productive clear to white. occ yellow. Notes pn drip severe. Throat is scratchy. Using the drops. 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 Now with acute right maxillary sinusitis with postnasal drip precipitating current cough paroxysms, note right external auditory canal occluded with cerumen    Plan> Need to clear her right ear of cerumen  Take azithromycin 250mg  Take two once then one daily until gone Take prednisone 10mg  Take 4 for two days three for two days two for two days one for two days Stay on flonase nasal spray Stay on chlor trimeton Use saline nasal spray three times daily Follow cough protocol with Delsym         11/24/2013: f/u appt w/ PW> Chief Complaint  Patient presents with  . 2 month follow up    Cough greatly improved while on cough protocol.  Notices cough worsens after decreasing delsym.  Cough prod over the past few days with white to yellow mucus.  No wheezing, chest tightnes, CP, or f/c/s.       Pt got better a while then got worse.  the patient is still on antihistamine and flonase.  Forgets the nasal spray.  Cannot get the delsym off. Got ears cleaned out.  Cough worse past two days yellow mucus.  No nasal d/c.  No postnasal drip. No GERD symptoms.  Pt with diarrhea for 1 year: goes 4-5x per day No wheezing, no dyspnea.     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 disease as a primary precipitating factor Note inhaled  steroids have not previously been of much benefit Acute tracheobronchitis currently    Plan> Stop protonix due to diarrhea Increase acidophilus to twice daily Cefuroxime 500mg  twice daily x 5days Prednisone 10mg  Take 4 for two days three for two days two for two days one for two days Benzonatate 1-2 every 4-6 hours as needed for cough and delsym as needed : per cough protocol Stay on chlorpheniramine/fluticasone USe flutter valve 3 - 4 x daily  ~  July 19, 2014:  10mo ROV & add-on appt w/ SN> her PCP is DrGrisso in [place]    70 y/o WF pt of PW w/ RADS, cough variant asthma, not on any regular inhalers...  Prev CT Chest 05/27/12 at [facility] (they report hx melanoma) showed scarring left lung base 7-55mm nodular component anteriorly is unchanged, no adenopathy, borderline heart size & tort Ao  CT Sinuses 10/23/12 was neg, clear sinuses...   Prev PFT> 06/03/13 showed FVC=2.75 (92%), FEV1=2.04 (89%), %1sec=74, mid-flows min reduced at 72% predicted; METHACHOLINE challenge was NEG...  Last CXR> 02/12/13 showed norm heart size, clear lungs, mild DD Tspine...  She had left TKR 09/6293 w/ complications => required hydrocodone for pain & she notes cough resolved (note- she tells me she is due to have a screw removed);  She presents on this occas w/ 4mo hx cough, mostly dry, raspy/ scratchy throat, min  clear whitish sput, no hemoptysis, only SOB occurs w/ the coughing paraoxysms & the cough was worse at night- assoc w/ reflux symptoms & she reports Hx HH surg by [facility] in [facility] 02/2012 but notes that "it's back" and may need GI eval...     EXAM reveals Afeb, VSS, O2sat=99% on RA;  HEENT- neg, mallampati2;  Chest- clear x dry cough, weak raspy voice;  Heart- RR w/o m/r/g;  Abd- soft, nontender;  Ext- w/o c/c/e...  CXR today 07/19/14 showed clear lungs, no infiltrates etc... IMP/PLAN>>  [name] has RADS/ AB with prob nocturnal reflux trigger and we discussed a vigorous antireflux regimen- Protonix40  before dinner, NPO after dinner, elev HOB, Pepcid40 qhs;  In addition- trial Dulera200 (not covered, therefore go w/ SYMBICORT160- 2spBid w/ spacer, plus Tussionex, etc...  ROV recheck in 6-8 weeks... F ~  September 15, 2014:  51mo ROV w/ SN>  [name] stopped the Symbicort due to price & never filled the Tussionex, but she has followed a vigorous antireflux regimen w/ Protonix40 before dinner, NPO after dinner, elev HOB 6", & Pepcid40 before bed w/sip; she notes that her cough is improved, no sput/ no blood/ denies SOB/ CP/ etc & her voice is better too;  She has prev tried Advair as well & it didn't seem to help much she says;  We discussed continuing the vigorous antireflux regimen & try BREO one inhalation daily- asked to get a copy of her prescription drug formulary for [place] to review... She reports having surg on her left heel w/ a melanoma removed by [facility], she tells me that "they got it all" and the kind that she had doesn't spread internally so she doesn't need further surg or referral to [facility] (no data avil to [place] here in [place])...  EXAM reveals Afeb, VSS, O2sat=98% on RA;  HEENT- neg, mallampati2;  Chest- clear w/o w/r/r;  Heart- RR w/o m/r/g;  Abd- soft, nontender;  Ext- w/o c/c/e...    RADS ==> try a diff ICS/LABA in BREO one inhalation daily & she will get copy of her Formulary...    Hx Cough variant asthma ==> ok to use DELSYM, TESSALON, MUCINEX OTC as needed...    Laryngopharyngeal reflux ==> she will continue vigorous antireflux regimen w/ PPI, NPO after dinner, H2blocker at bedtime, elev HOB etc...    HH/ GERD ==> may need further GI eval by her gastroenterologist, if not responding...  We reviewed prob list, meds, xrays and labs> she'll get 2016 Flu vaccine from her PCP... IMP/PLAN>>  [name] is improved on her vigorous antireflux regimen & she will continue to apply this every night;  We decided to try [facility] & she will get a copy of her prescription drug formulary fior [place] to review;  ROV  in 37mo and sooner if needed prn...   ~  January 17, 2015:  10mo ROV & [name] had another URI (sinus & ear infection) treated by her PCP w/ Levaquin;  She is c/o cough, small amt clear sput, no hemoptysis but w/ post nasal drip & chest congestion;  She has hx RADS and cough variant asthma, plus LPR/ GERD;  Med list indicates Symbicort (no ttaking- $$), Singulair10, Flonase, Tessalon, Dexilant, Pepcid... She did not contact her insurance company for a copy of her formulary for [place] to review- she doesn't recall getting BREO last visit...  EXAM reveals Afeb, VSS, O2sat=98% on RA;  HEENT- neg, mallampati2;  Chest- clear w/o w/r/r;  Heart- RR w/o m/r/g;  Abd- soft,  nontender;  Ext- w/o c/c/e... IMP/PLAN>>     [name] has an acute exac of her RADS, sl better after Levaquin from PCP but lingering congestion, cough, some SOB=> c/w airway inflammation & we discussed the need for anti-inflamm rx w/ Medrol & inhaled Breo- hopefully her new insurance ([employer]) will cover thi med...     RADS ==> discussed the need for regular meds to prevent exacerbations; we will treat this exac w/ Depo80 + Medrol dosepak;  She still needs to get a copy of her prescription drug formulary, we will try the BREO- 1/d...    Hx Cough variant asthma ==> needs to take the BREO daily; ok to use DELSYM, TESSALON, [place] OTC as needed...    Laryngopharyngeal reflux ==> she will continue vigorous antireflux regimen w/ PPI, NPO after dinner, H2blocker at bedtime, elev HOB etc...    HH/ GERD ==> she has seen her gastroenterologist & they switched her to [place]...    Past Medical History  Diagnosis Date  . Melanoma ([place])   . Endometriosis   . Hyperlipidemia   . Asthma   . Melanoma ([place]) 2011    heel  . Cough     chronic, pt. reports for 3.5 yrs.   . Complication of anesthesia   . Atrial fibrillation ([place])     pt. reports that [facility] that she had a "slight afib.", no further test needed (01/07/14 cardiology note does not  mention any afib, just poor anterior r wave progression)  . OSA (obstructive sleep apnea)     does not tolerate CPAP , pt. reports that its severe, but doesn't use it anymore, since weight loss.   Study done in [place], not sure where.   . Anxiety     antianxiety med. usually at night but can take during the day for anxiety  . GERD (gastroesophageal reflux disease)   . History of hiatal hernia     pt. has been told that the hiatal hernia has reappeared  . Arthritis     both knees & hands  also the back & neck  . Diarrhea     pt. reports that she has irritable bowel, loose stool & diarrhea both about 6 times per day, right after she eats.  . Hypertension     pt. reports that she use to take antihtn med, but reports since weight loss she has been able to come off.    Marland Kitchen PONV (postoperative nausea and vomiting)     Past Surgical History  Procedure Laterality Date  . Appendectomy    . Tonsillectomy    . Vesicovaginal fistula closure w/ tah    . Gallbladder surgery    . Hiatal hernia repair  2/14  . Cholecystectomy    . Abdominal hysterectomy    . Oophorectomy    . Knee arthroscopy      both knees for torn cartilage   . Eye surgery      cataracts removed / w IOL  . 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 acetaminophen (TYLENOL) 325 MG tablet Take 650 mg by mouth every 6 (six) hours as needed for headache. Per bottle as needed for headache  . aspirin EC 81 MG tablet Take 81 mg by mouth daily as needed (pain).  . benzonatate (  TESSALON) 100 MG capsule Take 1-2 every 4-6 hours as needed for cough  . dexlansoprazole (DEXILANT) 60 MG capsule Take 60 mg by mouth daily.  Marland Kitchen dextromethorphan (DELSYM) 30 MG/5ML liquid Take 2 tsp every 6  hours as needed for cough  . famotidine  (PEPCID) 40 MG tablet Take 1 tablet (40 mg total) by mouth at bedtime.  . fluticasone (FLONASE) 50 MCG/ACT nasal spray Place 2 sprays into both nostrils 2 (two) times daily.  Marland Kitchen HYDROcodone-acetaminophen (NORCO) 5-325 MG per tablet Take 1 tablet by mouth every 6 (six) hours as needed for moderate pain.  . Lactobacillus (ACIDOPHILUS) CAPS capsule Take 1 capsule by mouth every morning.   Marland Kitchen levothyroxine (SYNTHROID, LEVOTHROID) 88 MCG tablet Take 88 mcg by mouth daily before breakfast.  . LORazepam (ATIVAN) 0.5 MG tablet Take 0.5 mg by mouth every 6 (six) hours as needed for anxiety or sleep.   . methocarbamol (ROBAXIN) 500 MG tablet Take 1 tablet (500 mg total) by mouth 3 (three) times daily as needed.  . montelukast (SINGULAIR) 10 MG tablet Take 10 mg by mouth at bedtime.  . promethazine (PHENERGAN) 12.5 MG tablet Take 12.5 mg by mouth every 6 (six) hours as needed for nausea or vomiting.  Marland Kitchen Respiratory Therapy Supplies (FLUTTER) DEVI Use as directed  . budesonide-formoterol (SYMBICORT) 160-4.5 MCG/ACT inhaler Inhale 2 puffs into the lungs 2 (two) times daily. (Patient not taking: Reported on 09/15/2014)  . [DISCONTINUED] alendronate (FOSAMAX) 70 MG tablet Reported on 01/17/2015  . [DISCONTINUED] chlorpheniramine (CHLOR-TRIMETON) 4 MG tablet 12mg  at bedtime (Patient not taking: Reported on 01/17/2015)  . [DISCONTINUED] chlorpheniramine-HYDROcodone (TUSSIONEX PENNKINETIC ER) 10-8 MG/5ML SUER Take 5 mLs by mouth every 12 (twelve) hours as needed for cough. (Patient not taking: Reported on 09/15/2014)  . [DISCONTINUED] Cholecalciferol (VITAMIN D) 2000 UNITS CAPS Take 1 capsule by mouth every morning. Reported on 01/17/2015  . [DISCONTINUED] ciclopirox (PENLAC) 8 % solution Apply 1 application topically at bedtime. Reported on 01/17/2015  . [DISCONTINUED] Fluticasone Furoate-Vilanterol (BREO ELLIPTA) 100-25 MCG/INH AEPB Inhale 1 puff into the lungs daily. (Patient not taking: Reported on 01/17/2015)  .  [DISCONTINUED] fluticasone-salmeterol (ADVAIR HFA) 115-21 MCG/ACT inhaler Inhale 2 puffs into the lungs 2 (two) times daily. (Patient not taking: Reported on 01/17/2015)  . [DISCONTINUED] HYDROmorphone (DILAUDID) 2 MG tablet Take 0.5-1 tablets (1-2 mg total) by mouth every 4 (four) hours as needed for severe pain. (Patient not taking: Reported on 07/19/2014)  . [DISCONTINUED] Magnesium 250 MG TABS Take 1 tablet by mouth every other day. Reported on 01/17/2015  . [DISCONTINUED] ondansetron (ZOFRAN) 4 MG tablet Take 1 tablet (4 mg total) by mouth every 6 (six) hours as needed for nausea. (Patient not taking: Reported on 01/17/2015)  . [DISCONTINUED] pantoprazole (PROTONIX) 40 MG tablet 1 tablet daily. Reported on 01/17/2015  . [DISCONTINUED] sertraline (ZOLOFT) 50 MG tablet Reported on 01/17/2015  . [DISCONTINUED] warfarin (COUMADIN) 5 MG tablet Take 1 tablet (5 mg total) by mouth daily. (Patient not taking: Reported on 07/19/2014)    Allergies  Allergen Reactions  . Neurontin [Gabapentin] Other (See Comments)    Severe falls   . Codeine Nausea And Vomiting  . Oxycodone Nausea And Vomiting  . Tramadol     REACTION: GI discomfort with high doses  . Cyclobenzaprine Palpitations  . Sulfa Antibiotics Rash    Current Medications, Allergies, Past Medical History, Past Surgical History, Family History, and Social History were reviewed in [employer] record.   Review of Systems  Constitutional:  No  weight loss, night sweats,  Fevers, chills, ++fatigue, lassitude. HEENT:   No headaches,  Difficulty swallowing,  Tooth/dental problems,  +++Sore throat,                No sneezing, itching, ear ache, nasal congestion,+++ post nasal drip,  CV:  No chest pain,  Orthopnea, PND, swelling in lower extremities, anasarca, dizziness, palpitations GI  No heartburn, indigestion, abdominal pain, nausea, vomiting, diarrhea, change in bowel habits, loss of appetite Resp: No shortness of breath  with exertion or at rest.  No excess mucus, notes  productive cough,  Notes non-productive cough,  No coughing up of blood.  No change in color of mucus.  No wheezing.  No chest wall deformity Skin: no rash or lesions. GU: no dysuria, change in color of urine, no urgency or frequency.  No flank pain. MS:  No joint pain or swelling.  No decreased range of motion.  No back pain. Psych:  No change in mood or affect. No depression or anxiety.  No memory loss.     Objective:   Physical Exam  Filed Vitals:   01/17/15 0920  BP: 120/70  Pulse: 78  Temp: 98.1 F (36.7 C)  TempSrc: Oral  Weight: 142 lb 9.6 oz (64.683 kg)  SpO2: 98%   Gen: Pleasant, well-nourished, in no distress,  normal affect ENT: No lesions,  mouth clear,  oropharynx clear, prominent postnasal drip,  Neck: No JVD, no TMG, no carotid bruits Lungs: No use of accessory muscles, no dullness to percussion, clear w/o w/r/r and voice is improved back to nl. Cardiovascular: RRR, heart sounds normal, no murmur or gallops, no peripheral edema Abdomen: soft and NT, no HSM,  BS normal Musculoskeletal: No deformities, no cyanosis or clubbing Neuro: alert, non focal Skin: Warm, no lesions or rashes     Assessment & Plan:    IMP/PLAN >>     RADS ==> try a diff ICS/LABA in BREO one inhalation daily & she will get copy of her Formulary...    Hx Cough variant asthma ==> ok to use DELSYM, TESSALON, MUCINEX OTC as needed...    Laryngopharyngeal reflux ==> she will continue vigorous antireflux regimen w/ PPI, NPO after dinner, H2blocker at bedtime, elev HOB etc...    HH/ GERD ==> may need further GI eval by her gastroenterologist, if not responding...   09/15/14>   [name] is improved on her vigorous antireflux regimen & she will continue to apply this every night;  We decided to try [facility] & she will get a copy of her prescription drug formulary fior [place] to review;   01/17/15>   [name] has an acute exac of her RADS, sl better after  Levaquin from PCP but lingering congestion, cough, some SOB=> c/w airway inflammation & we discussed the need for anti-inflamm rx w/ Medrol & inhaled Breo- hopefully her new insurance ([employer]) will cover thi med..   Patient's Medications  New Prescriptions   FLUTICASONE FUROATE-VILANTEROL (BREO ELLIPTA) 100-25 MCG/INH AEPB    Inhale 1 puff into the lungs daily.   METHYLPREDNISOLONE (MEDROL) 4 MG TABLET    Take as directed  Previous Medications   ACETAMINOPHEN (TYLENOL) 325 MG TABLET    Take 650 mg by mouth every 6 (six) hours as needed for headache. Per bottle as needed for headache   ASPIRIN EC 81 MG TABLET    Take 81 mg by mouth daily as needed (pain).   BENZONATATE (TESSALON) 100 MG CAPSULE    Take  1-2 every 4-6 hours as needed for cough   BUDESONIDE-FORMOTEROL (SYMBICORT) 160-4.5 MCG/ACT INHALER    Inhale 2 puffs into the lungs 2 (two) times daily.   DEXLANSOPRAZOLE (DEXILANT) 60 MG CAPSULE    Take 60 mg by mouth daily.   DEXTROMETHORPHAN (DELSYM) 30 MG/5ML LIQUID    Take 2 tsp every 6  hours as needed for cough   FAMOTIDINE (PEPCID) 40 MG TABLET    Take 1 tablet (40 mg total) by mouth at bedtime.   FLUTICASONE (FLONASE) 50 MCG/ACT NASAL SPRAY    Place 2 sprays into both nostrils 2 (two) times daily.   HYDROCODONE-ACETAMINOPHEN (NORCO) 5-325 MG PER TABLET    Take 1 tablet by mouth every 6 (six) hours as needed for moderate pain.   LACTOBACILLUS (ACIDOPHILUS) CAPS CAPSULE    Take 1 capsule by mouth every morning.    LEVOTHYROXINE (SYNTHROID, LEVOTHROID) 88 MCG TABLET    Take 88 mcg by mouth daily before breakfast.   LORAZEPAM (ATIVAN) 0.5 MG TABLET    Take 0.5 mg by mouth every 6 (six) hours as needed for anxiety or sleep.    METHOCARBAMOL (ROBAXIN) 500 MG TABLET    Take 1 tablet (500 mg total) by mouth 3 (three) times daily as needed.   MONTELUKAST (SINGULAIR) 10 MG TABLET    Take 10 mg by mouth at bedtime.   PROMETHAZINE (PHENERGAN) 12.5 MG TABLET    Take 12.5 mg by mouth every 6  (six) hours as needed for nausea or vomiting.   RESPIRATORY THERAPY SUPPLIES (FLUTTER) DEVI    Use as directed  Modified Medications   No medications on file  Discontinued Medications   ALENDRONATE (FOSAMAX) 70 MG TABLET    Reported on 01/17/2015   CHLORPHENIRAMINE (CHLOR-TRIMETON) 4 MG TABLET    12mg  at bedtime   CHLORPHENIRAMINE-HYDROCODONE (TUSSIONEX PENNKINETIC ER) 10-8 MG/5ML SUER    Take 5 mLs by mouth every 12 (twelve) hours as needed for cough.   CHOLECALCIFEROL (VITAMIN D) 2000 UNITS CAPS    Take 1 capsule by mouth every morning. Reported on 01/17/2015   CICLOPIROX (PENLAC) 8 % SOLUTION    Apply 1 application topically at bedtime. Reported on 01/17/2015   FLUTICASONE FUROATE-VILANTEROL (BREO ELLIPTA) 100-25 MCG/INH AEPB    Inhale 1 puff into the lungs daily.   FLUTICASONE-SALMETEROL (ADVAIR HFA) 115-21 MCG/ACT INHALER    Inhale 2 puffs into the lungs 2 (two) times daily.   HYDROMORPHONE (DILAUDID) 2 MG TABLET    Take 0.5-1 tablets (1-2 mg total) by mouth every 4 (four) hours as needed for severe pain.   MAGNESIUM 250 MG TABS    Take 1 tablet by mouth every other day. Reported on 01/17/2015   ONDANSETRON (ZOFRAN) 4 MG TABLET    Take 1 tablet (4 mg total) by mouth every 6 (six) hours as needed for nausea.   PANTOPRAZOLE (PROTONIX) 40 MG TABLET    1 tablet daily. Reported on 01/17/2015   SERTRALINE (ZOLOFT) 50 MG TABLET    Reported on 01/17/2015   WARFARIN (COUMADIN) 5 MG TABLET    Take 1 tablet (5 mg total) by mouth daily.

## 2015-01-17 NOTE — Patient Instructions (Signed)
Today we updated your med list in our EPIC system...    Continue your current medications the same...  We gave you a Depo shot today & a new prescription for a MEDROL Dosepak (start in AM 1/17)...  We also wrote for [facility] one inhalation daily (hopefully your new [employer] will cover this better)...  Continue your vigorous antireflux regimen- Dexilant rx, don't eat or drink after dinnertime, elevate the head of your bed ~6"...  Call for any questions...  Let's plan a follow up visit in 56mo, sooner if needed for problems.Marland KitchenMarland Kitchen

## 2015-03-15 DIAGNOSIS — E559 Vitamin D deficiency, unspecified: Secondary | ICD-10-CM | POA: Insufficient documentation

## 2015-03-15 DIAGNOSIS — F5101 Primary insomnia: Secondary | ICD-10-CM | POA: Insufficient documentation

## 2015-03-15 DIAGNOSIS — E039 Hypothyroidism, unspecified: Secondary | ICD-10-CM | POA: Insufficient documentation

## 2015-03-15 DIAGNOSIS — E782 Mixed hyperlipidemia: Secondary | ICD-10-CM | POA: Insufficient documentation

## 2015-03-15 DIAGNOSIS — Z79899 Other long term (current) drug therapy: Secondary | ICD-10-CM

## 2015-03-15 DIAGNOSIS — M150 Primary generalized (osteo)arthritis: Secondary | ICD-10-CM

## 2015-03-15 DIAGNOSIS — M8949 Other hypertrophic osteoarthropathy, multiple sites: Secondary | ICD-10-CM | POA: Insufficient documentation

## 2015-03-15 DIAGNOSIS — M159 Polyosteoarthritis, unspecified: Secondary | ICD-10-CM

## 2015-03-15 DIAGNOSIS — R5383 Other fatigue: Secondary | ICD-10-CM

## 2015-03-15 DIAGNOSIS — R5381 Other malaise: Secondary | ICD-10-CM | POA: Insufficient documentation

## 2015-03-15 DIAGNOSIS — M810 Age-related osteoporosis without current pathological fracture: Secondary | ICD-10-CM

## 2015-03-15 HISTORY — DX: Other malaise: R53.81

## 2015-03-15 HISTORY — DX: Age-related osteoporosis without current pathological fracture: M81.0

## 2015-03-15 HISTORY — DX: Mixed hyperlipidemia: E78.2

## 2015-03-15 HISTORY — DX: Primary insomnia: F51.01

## 2015-03-15 HISTORY — DX: Other hypertrophic osteoarthropathy, multiple sites: M89.49

## 2015-03-15 HISTORY — DX: Other fatigue: R53.83

## 2015-03-15 HISTORY — DX: Primary generalized (osteo)arthritis: M15.0

## 2015-03-15 HISTORY — DX: Other long term (current) drug therapy: Z79.899

## 2015-03-15 HISTORY — DX: Polyosteoarthritis, unspecified: M15.9

## 2015-05-09 HISTORY — PX: COLONOSCOPY: SHX174

## 2015-05-18 ENCOUNTER — Ambulatory Visit: Payer: Medicare Other | Admitting: Pulmonary Disease

## 2015-10-17 IMAGING — RF DG KNEE 1-2V*L*
1 series · 2 of 2 positions shown · non-contrast
Comparison: None.

CLINICAL DATA: Left knee arthroplasty, mcl repair

EXAM:
LEFT KNEE - 1-2 VIEW; DG C-ARM 61-120 MIN

[Series 1: run · 2 of 2 slices shown]
[im 1/2]
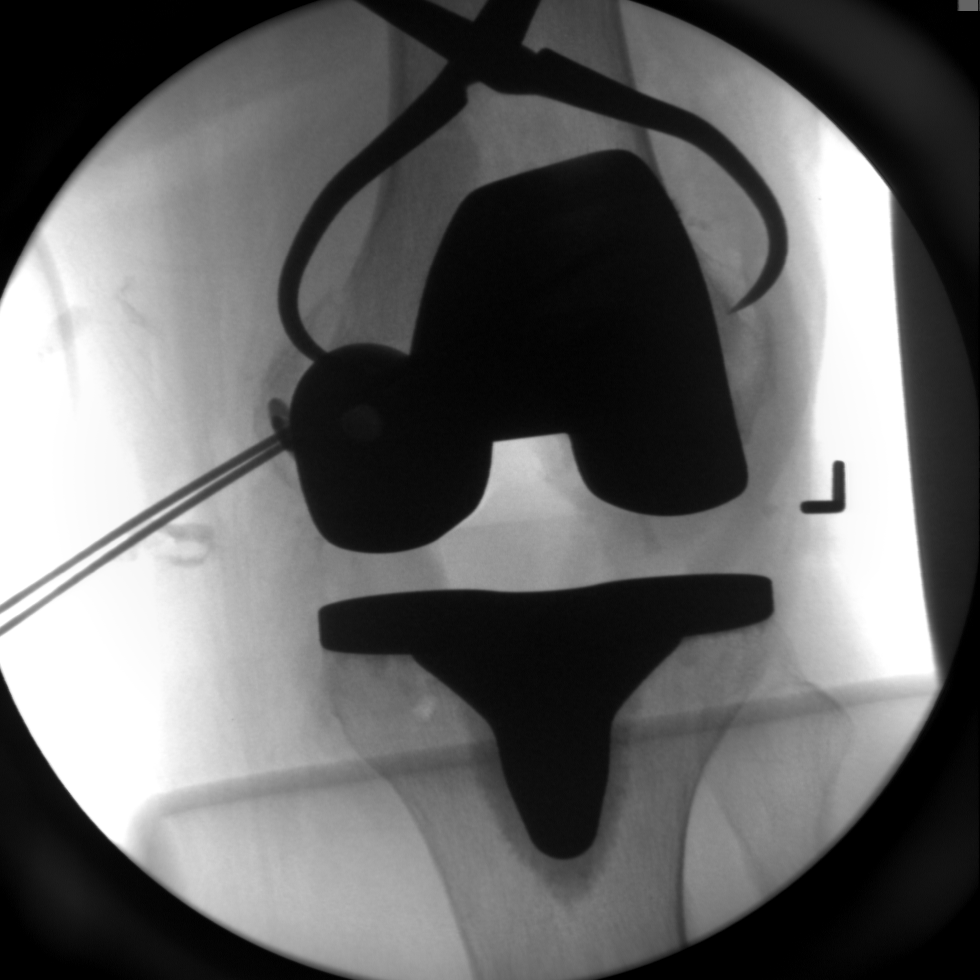
[im 2/2]
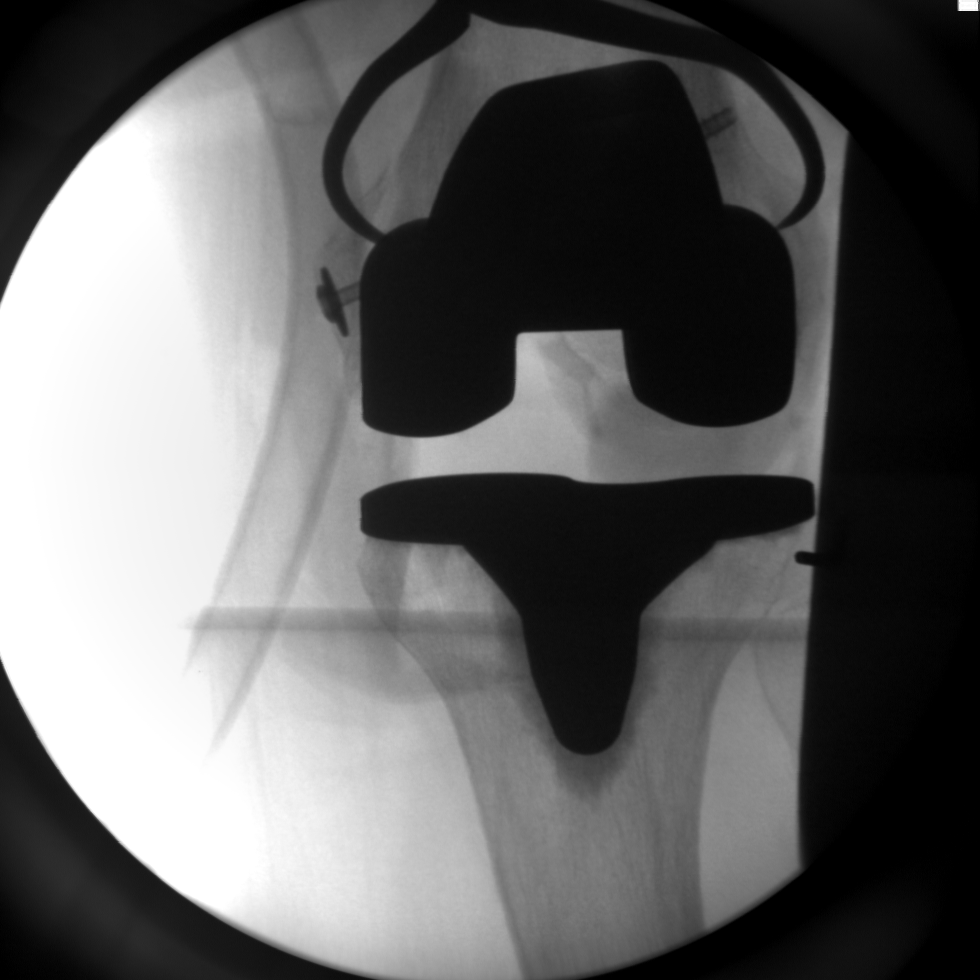

[2 of 2 positions shown; findings below may reference images not displayed]

FINDINGS: Spot fluoroscopic intraoperative views demonstrate left total knee
arthroplasty. Components appear aligned in the frontal plane. Medial
femoral condyle fixation screw noted.
IMPRESSION: Status post left total knee arthroplasty.  No complicating feature.

## 2016-01-02 HISTORY — PX: HIATAL HERNIA REPAIR: SHX195

## 2016-04-24 ENCOUNTER — Other Ambulatory Visit: Payer: Self-pay | Admitting: Orthopedic Surgery

## 2016-05-04 NOTE — H&P (Signed)
[name] is an 71 y.o. female.    Chief Complaint: left knee pain  HPI: Pt is a 71 y.o. female complaining of left knee pain for multiple years. Pain had continually increased since the beginning. X-rays in the clinic show end-stage arthritic changes of the left knee. Pt has tried various conservative treatments which have failed to alleviate their symptoms, including injections and therapy. Various options are discussed with the patient. Risks, benefits and expectations were discussed with the patient. Patient understand the risks, benefits and expectations and wishes to proceed with surgery.   PCP:  Gilford Rile, MD  D/C Plans: Home  PMH: Past Medical History:  Diagnosis Date  . Anxiety    antianxiety med. usually at night but can take during the day for anxiety  . Arthritis    both knees & hands  also the back & neck  . Asthma   . Atrial fibrillation ([place])    pt. reports that [facility] that she had a "slight afib.", no further test needed (01/07/14 cardiology note does not mention any afib, just poor anterior r wave progression)  . Complication of anesthesia   . Cough    chronic, pt. reports for 3.5 yrs.   . Diarrhea    pt. reports that she has irritable bowel, loose stool & diarrhea both about 6 times per day, right after she eats.  . Endometriosis   . GERD (gastroesophageal reflux disease)   . History of hiatal hernia    pt. has been told that the hiatal hernia has reappeared  . Hyperlipidemia   . Hypertension    pt. reports that she use to take antihtn med, but reports since weight loss she has been able to come off.    . Melanoma ([place])   . Melanoma ([place]) 2011   heel  . OSA (obstructive sleep apnea)    does not tolerate CPAP , pt. reports that its severe, but doesn't use it anymore, since weight loss.   Study done in [place], not sure where.   Marland Kitchen PONV (postoperative nausea and vomiting)     PSH: Past Surgical History:  Procedure Laterality Date  . ABDOMINAL  HYSTERECTOMY    . APPENDECTOMY    . CHOLECYSTECTOMY    . EYE SURGERY     cataracts removed / w IOL  . GALLBLADDER SURGERY    . HIATAL HERNIA REPAIR  2/14  . KNEE ARTHROSCOPY     both knees for torn cartilage   . OOPHORECTOMY    . TONSILLECTOMY    . TOTAL KNEE ARTHROPLASTY Left 01/29/2014   Procedure: LEFT TOTAL KNEE ARTHROPLASTY MCL REPAIR;  Surgeon: Augustin Schooling, MD;  Location: [place];  Service: Orthopedics;  Laterality: Left;  Marland Kitchen VESICOVAGINAL FISTULA CLOSURE W/ TAH      Social History:  reports that she has never smoked. She has never used smokeless tobacco. She reports that she does not drink alcohol or use drugs.  Allergies:  Allergies  Allergen Reactions  . Neurontin [Gabapentin] Other (See Comments)    Severe falls   . Ciprofloxacin Diarrhea  . Codeine Nausea And Vomiting  . Oxycodone Nausea And Vomiting  . Tramadol     REACTION: GI discomfort with high doses  . Cyclobenzaprine Palpitations  . Sulfa Antibiotics Rash    Medications: No current facility-administered medications for this encounter.    Current Outpatient Prescriptions  Medication Sig Dispense Refill  . acetaminophen (TYLENOL) 500 MG tablet Take 1,000 mg by mouth every 8 (eight) hours  as needed for mild pain.    Marland Kitchen aspirin EC 81 MG tablet Take 81 mg by mouth daily as needed (pain).    . Cholecalciferol (VITAMIN D) 2000 units CAPS Take 2,000 Units by mouth daily.    Marland Kitchen dicyclomine (BENTYL) 10 MG capsule Take 10 mg by mouth every evening.    Marland Kitchen HYDROcodone-acetaminophen (NORCO) 5-325 MG per tablet Take 1 tablet by mouth every 6 (six) hours as needed for moderate pain. 60 tablet 0  . Lactobacillus (ACIDOPHILUS) CAPS capsule Take 1 capsule by mouth every morning.     Marland Kitchen levothyroxine (SYNTHROID, LEVOTHROID) 88 MCG tablet Take 88 mcg by mouth daily before breakfast.    . loratadine (CLARITIN) 10 MG tablet Take 10 mg by mouth daily.    Marland Kitchen LORazepam (ATIVAN) 0.5 MG tablet Take 0.5 mg by mouth every evening.   0  .  methocarbamol (ROBAXIN) 500 MG tablet Take 1 tablet (500 mg total) by mouth 3 (three) times daily as needed. (Patient taking differently: Take 500 mg by mouth at bedtime. ) 60 tablet 1  . omeprazole (PRILOSEC) 40 MG capsule Take 40 mg by mouth daily.    . Probiotic Product (PROBIOTIC PO) Take 1 tablet by mouth daily.    . promethazine (PHENERGAN) 25 MG tablet Take 25 mg by mouth every 6 (six) hours as needed for nausea or vomiting.    . benzonatate (TESSALON) 100 MG capsule Take 1-2 every 4-6 hours as needed for cough (Patient not taking: Reported on 05/03/2016) 90 capsule 4  . budesonide-formoterol (SYMBICORT) 160-4.5 MCG/ACT inhaler Inhale 2 puffs into the lungs 2 (two) times daily. (Patient not taking: Reported on 09/15/2014) 1 Inhaler 12  . dextromethorphan (DELSYM) 30 MG/5ML liquid Take 2 tsp every 6  hours as needed for cough (Patient not taking: Reported on 05/03/2016) 89 mL   . famotidine (PEPCID) 40 MG tablet Take 1 tablet (40 mg total) by mouth at bedtime. (Patient not taking: Reported on 05/03/2016) 30 tablet 6  . fluticasone (FLONASE) 50 MCG/ACT nasal spray Place 2 sprays into both nostrils 2 (two) times daily. (Patient not taking: Reported on 05/03/2016) 16 g 4  . Fluticasone Furoate-Vilanterol (BREO ELLIPTA) 100-25 MCG/INH AEPB Inhale 1 puff into the lungs daily. (Patient not taking: Reported on 05/03/2016) 60 each 6  . methylPREDNISolone (MEDROL) 4 MG tablet Take as directed (Patient not taking: Reported on 05/03/2016) 21 tablet 0  . Respiratory Therapy Supplies (FLUTTER) DEVI Use as directed 1 each 0    No results found for this or any previous visit (from the past 87 hour(s)). No results found.  ROS: Pain with rom of the left lower extremity  Physical Exam:  Alert and oriented 71 y.o. female in no acute distress Cranial nerves 2-12 intact Cervical spine: full rom with no tenderness, nv intact distally Chest: active breath sounds bilaterally, no wheeze rhonchi or rales Heart: regular  rate and rhythm, no murmur Abd: non tender non distended with active bowel sounds Hip is stable with rom  Left knee with moderate medial and lateral joint line tenderness nv intact distally No rashes or edema Antalgic gait  Assessment/Plan Assessment: left knee end stage osteoarthritis  Plan: Patient will undergo a left total knee arthroplasty by Dr. Veverly Fells at [facility]. Risks benefits and expectations were discussed with the patient. Patient understand risks, benefits and expectations and wishes to proceed.

## 2016-05-07 NOTE — Pre-Procedure Instructions (Signed)
Daielle Melcher  05/07/2016      [id number] [place], [place] [address] [address] Phone: [phone] Fax: [phone]  [place], [place] - [place] [address] Phone: [phone] Fax: [phone]    Your procedure is scheduled on Fri, May 18 @ 1:00 PM  Report to [facility] Admitting at 11:00 AM  Call this number if you have problems the morning of surgery:  [phone]   Remember:  Do not eat food or drink liquids after midnight.  Take these medicines the morning of surgery with A SIP OF WATER Pain Pill(if needed),Synthroid(Levothyroxine),Omeprazole(Prilosec)              Stop taking your Aspirin along with any Vitamins or Herbal Medications. No Goody's,BC's,Aleve,Advil,Motrin,Ibuprofen,or Fish Oil.    Do not wear jewelry, make-up or nail polish.  Do not wear lotions, powders,perfumes, or deoderant.  Do not shave 48 hours prior to surgery.    Do not bring valuables to the hospital.  [facility] is not responsible for any belongings or valuables.  Contacts, dentures or bridgework may not be worn into surgery.  Leave your suitcase in the car.  After surgery it may be brought to your room.  For patients admitted to the hospital, discharge time will be determined by your treatment team.  Patients discharged the day of surgery will not be allowed to drive home.    [facility]  Before surgery, you can play an important role.  Because skin is not sterile, your skin needs to be as free of germs as possible.  You can reduce the number of germs on you skin by washing with CHG (chlorahexidine gluconate) soap before surgery.  CHG is an antiseptic cleaner which kills germs and bonds with the skin to continue killing germs even after washing.  Please DO NOT use if you have an allergy to CHG or antibacterial  soaps.  If your skin becomes reddened/irritated stop using the CHG and inform your nurse when you arrive at Short Stay.  Do not shave (including legs and underarms) for at least 48 hours prior to the first CHG shower.  You may shave your face.  Please follow these instructions carefully:   1.  Shower with CHG Soap the night before surgery and the                                morning of Surgery.  2.  If you choose to wash your hair, wash your hair first as usual with your       normal shampoo.  3.  After you shampoo, rinse your hair and body thoroughly to remove the                      Shampoo.  4.  Use CHG as you would any other liquid soap.  You can apply chg directly       to the skin and wash gently with scrungie or a clean washcloth.  5.  Apply the CHG Soap to your body ONLY FROM THE NECK DOWN.        Do not use on open wounds or open sores.  Avoid contact with your eyes,       ears, mouth  and genitals (private parts).  Wash genitals (private parts)       with your normal soap.  6.  Wash thoroughly, paying special attention to the area where your surgery        will be performed.  7.  Thoroughly rinse your body with warm water from the neck down.  8.  DO NOT shower/wash with your normal soap after using and rinsing off       the CHG Soap.  9.  Pat yourself dry with a clean towel.            10.  Wear clean pajamas.            11.  Place clean sheets on your bed the night of your first shower and do not        sleep with pets.  Day of Surgery  Do not apply any lotions/deoderants the morning of surgery.  Please wear clean clothes to the hospital/surgery center.    Please read over the following fact sheets that you were given. Pain Booklet, Coughing and Deep Breathing, MRSA Information and Surgical Site Infection Prevention

## 2016-05-08 ENCOUNTER — Encounter (HOSPITAL_COMMUNITY)
Admission: RE | Admit: 2016-05-08 | Discharge: 2016-05-08 | Disposition: A | Discharge status: Home / Self Care | Payer: Medicare Other | Source: Ambulatory Visit | Attending: Orthopedic Surgery | Admitting: Orthopedic Surgery

## 2016-05-08 ENCOUNTER — Encounter (HOSPITAL_COMMUNITY): Payer: Self-pay

## 2016-05-08 DIAGNOSIS — Z01812 Encounter for preprocedural laboratory examination: Secondary | ICD-10-CM | POA: Insufficient documentation

## 2016-05-08 DIAGNOSIS — Z01810 Encounter for preprocedural cardiovascular examination: Secondary | ICD-10-CM | POA: Insufficient documentation

## 2016-05-08 DIAGNOSIS — M1712 Unilateral primary osteoarthritis, left knee: Secondary | ICD-10-CM | POA: Insufficient documentation

## 2016-05-08 LAB — CBC
HCT: 45.3 % (ref 36.0–46.0)
Hemoglobin: 14.7 g/dL (ref 12.0–15.0)
MCH: 30.6 pg (ref 26.0–34.0)
MCHC: 32.5 g/dL (ref 30.0–36.0)
MCV: 94.4 fL (ref 78.0–100.0)
Platelets: 265 10*3/uL (ref 150–400)
RBC: 4.8 MIL/uL (ref 3.87–5.11)
RDW: 13.5 % (ref 11.5–15.5)
WBC: 9 10*3/uL (ref 4.0–10.5)

## 2016-05-08 LAB — BASIC METABOLIC PANEL
Anion gap: 8 (ref 5–15)
BUN: 14 mg/dL (ref 6–20)
CO2: 25 mmol/L (ref 22–32)
Calcium: 9.6 mg/dL (ref 8.9–10.3)
Chloride: 109 mmol/L (ref 101–111)
Creatinine, Ser: 0.86 mg/dL (ref 0.44–1.00)
GFR calc Af Amer: >60 mL/min (ref >60)
GFR calc non Af Amer: >60 mL/min (ref >60)
Glucose, Bld: 81 mg/dL (ref 65–99)
Potassium: 4 mmol/L (ref 3.5–5.1)
Sodium: 142 mmol/L (ref 135–145)

## 2016-05-08 LAB — ABO/RH: ABO/RH(D): O POS

## 2016-05-08 LAB — TYPE AND SCREEN
ABO/RH(D): O POS
Antibody Screen: NEGATIVE

## 2016-05-08 LAB — SURGICAL PCR SCREEN
MRSA, PCR: NEGATIVE
Staphylococcus aureus: NEGATIVE

## 2016-05-08 NOTE — Progress Notes (Addendum)
PCP: Dr. Gilford Rile  Cardiologist: pt denies  EKG: pt denies  Stress test:pt reports over 5 years ago  ECHO: pt denies ever  Cardiac Cath: pt denies ever  Chest x-ray:10/2015 -Care everywhere [facility]

## 2016-05-18 ENCOUNTER — Inpatient Hospital Stay (HOSPITAL_COMMUNITY): Admission: RE | Admit: 2016-05-18 | Payer: Medicare Other | Source: Ambulatory Visit | Admitting: Orthopedic Surgery

## 2016-05-18 SURGERY — TOTAL KNEE REVISION
Anesthesia: Choice | Site: Knee | Laterality: Left

## 2016-06-08 DIAGNOSIS — I2510 Atherosclerotic heart disease of native coronary artery without angina pectoris: Secondary | ICD-10-CM

## 2016-06-08 DIAGNOSIS — M5030 Other cervical disc degeneration, unspecified cervical region: Secondary | ICD-10-CM

## 2016-06-08 HISTORY — DX: Other cervical disc degeneration, unspecified cervical region: M50.30

## 2016-06-08 HISTORY — DX: Atherosclerotic heart disease of native coronary artery without angina pectoris: I25.10

## 2016-08-01 HISTORY — PX: ESOPHAGEAL DILATION: SHX303

## 2017-01-19 DIAGNOSIS — M5412 Radiculopathy, cervical region: Secondary | ICD-10-CM

## 2017-01-19 HISTORY — DX: Radiculopathy, cervical region: M54.12

## 2017-02-14 ENCOUNTER — Other Ambulatory Visit: Payer: Self-pay | Admitting: Orthopedic Surgery

## 2017-02-14 DIAGNOSIS — M5412 Radiculopathy, cervical region: Secondary | ICD-10-CM

## 2017-03-12 ENCOUNTER — Ambulatory Visit (INDEPENDENT_AMBULATORY_CARE_PROVIDER_SITE_OTHER): Payer: Medicare Other | Admitting: Allergy

## 2017-03-12 ENCOUNTER — Encounter: Payer: Self-pay | Admitting: Allergy

## 2017-03-12 VITALS — BP 130/60 | HR 72 | Temp 97.8°F | Resp 24 | Ht 62.0 in | Wt 147.6 lb

## 2017-03-12 DIAGNOSIS — J3089 Other allergic rhinitis: Secondary | ICD-10-CM | POA: Diagnosis not present

## 2017-03-12 DIAGNOSIS — R05 Cough: Secondary | ICD-10-CM

## 2017-03-12 DIAGNOSIS — J4540 Moderate persistent asthma, uncomplicated: Secondary | ICD-10-CM | POA: Diagnosis not present

## 2017-03-12 DIAGNOSIS — R059 Cough, unspecified: Secondary | ICD-10-CM

## 2017-03-12 MED ORDER — AZELASTINE HCL 0.1 % NA SOLN: NASAL | 5 refills | Status: DC

## 2017-03-12 NOTE — Progress Notes (Signed)
New Patient Note  RE: [name] MRN: [id number] DOB: 1945/10/23 Date of Office Visit: 03/12/2017  Referring provider: Raina Mina., MD Primary care provider: Raina Mina., MD  Chief Complaint: cough  History of present illness: [name] is a 72 y.o. female presenting today for consultation for cough.  She states she's had a chronic cough for 5 years.   She has had 2 hiatal hernia surgeries to see if she would have improvement with reflux control.  She reports the first surgery ended up "slipping" and she had a second surgery June 2018.  She has had some improvement in her reflux symptoms s/p surgeries.  She does have omeprazole that she takes "when she feels she has indigestion" which is about once a week.  She does have increased head of bed as well.    She reports her throat is scratchy and feels like there is a lump in her throat.  She throat clears throughout the day.  She also has hoarseness by the end the day.   Her cough tends to be worse in the evenings.  Cough is not productive.  She reports she has been to 2-3 different pulmonologist for this chronic cough (Dr. Joya Gaskins is last pulmonologist she has seen).  She reports she has been on variety of different medications including tramadol and cyclobenzaprine for cough which she reports gave her GI side effects.  She has also been on advair, Breo, symbicort, dulera, spiriva in the past but does not report significant improvement with any of these inhalers.  She reports she has ventolin which also does not help to relieve symptoms much.   She report she has been diagnosed with "mild asthma and COPD" in the past.       She denies a smoking history but reports her dad was a heavy smoker.    She does report significant runny nose.  She has used Flonase in the past as well as OTC antihistamine which hasn't been that effective.  She does recall having allergy testing done in the past showing sensitivity to dust mite, roaches, hay  being positive on previous testing.  She has not undergone allergy shots in the past.    Review of systems: Review of Systems  Constitutional: Negative for fever, malaise/fatigue and weight loss.  HENT: Positive for congestion.   Eyes: Negative for pain, discharge and redness.  Respiratory: Positive for cough and sputum production. Negative for shortness of breath and wheezing.   Cardiovascular: Negative for chest pain.  Gastrointestinal: Positive for heartburn. Negative for abdominal pain, constipation, diarrhea, nausea and vomiting.  Musculoskeletal: Negative for joint pain and myalgias.  Neurological: Negative for headaches.    All other systems negative unless noted above in HPI  Past medical history: Past Medical History:  Diagnosis Date  . Anxiety    antianxiety med. usually at night but can take during the day for anxiety  . Arthritis    both knees & hands  also the back & neck  . Asthma   . Atrial fibrillation ([place])    pt. reports that [facility] that she had a "slight afib.", no further test needed (01/07/14 cardiology note does not mention any afib, just poor anterior r wave progression)  . Complication of anesthesia   . Cough    chronic, pt. reports for 3.5 yrs.   . Diarrhea    pt. reports that she has irritable bowel, loose stool & diarrhea both about 6 times per day, right after  she eats.  . Endometriosis   . GERD (gastroesophageal reflux disease)   . History of hiatal hernia    pt. has been told that the hiatal hernia has reappeared  . Hyperlipidemia   . Hypertension    pt. reports that she use to take antihtn med, but reports since weight loss she has been able to come off.    . Melanoma ([place])   . Melanoma ([place]) 2011   heel  . OSA (obstructive sleep apnea)    does not tolerate CPAP , pt. reports that its severe, but doesn't use it anymore, since weight loss.   Study done in [place], not sure where.   Marland Kitchen PONV (postoperative nausea and vomiting)     Past  surgical history: Past Surgical History:  Procedure Laterality Date  . ABDOMINAL HYSTERECTOMY    . APPENDECTOMY    . CHOLECYSTECTOMY    . EYE SURGERY     cataracts removed / w IOL  . GALLBLADDER SURGERY    . HIATAL HERNIA REPAIR  2/14  . KNEE ARTHROSCOPY     both knees for torn cartilage   . OOPHORECTOMY    . TONSILLECTOMY    . TOTAL KNEE ARTHROPLASTY Left 01/29/2014   Procedure: LEFT TOTAL KNEE ARTHROPLASTY MCL REPAIR;  Surgeon: Augustin Schooling, MD;  Location: [place];  Service: Orthopedics;  Laterality: Left;  Marland Kitchen VESICOVAGINAL FISTULA CLOSURE W/ TAH      Family history:  Family History  Problem Relation Age of Onset  . Emphysema Father        smoked  . Heart disease Father   . Asthma Mother   . Colon cancer Mother   . Asthma Brother   . Breast cancer Sister   . Breast cancer Maternal Grandmother     Social history:  Occupational History  . 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 Smoking status: Never Smoker  . Smokeless tobacco: Never Used  Social History Narrative   Lives with husband in a home without carpeting with electric heating and central cooling.  Dog and cat in the home. Ponies outside home.   No concern for water damage, mildew or roachines in home.     Retired    Medication List: Allergies as of 03/12/2017      Reactions   Neurontin [gabapentin] Other (See Comments)   Severe falls    Ciprofloxacin Diarrhea   Codeine Nausea And Vomiting   Oxycodone Nausea And Vomiting   Tramadol    REACTION: GI discomfort with high doses   Cyclobenzaprine Palpitations   Sulfa Antibiotics Rash      Medication List        Accurate as of 03/12/17  3:29 PM. Always use your most recent med list.          acetaminophen 500 MG tablet Commonly known as:  TYLENOL Take 1,000 mg by mouth every 8 (eight) hours as needed for mild pain.   aspirin EC 81 MG tablet Take 81 mg by mouth daily as needed (pain).   azelastine 0.1 %  nasal spray Commonly known as:  ASTELIN Use two sprays in each nostril twice daily as directed   dicyclomine 10 MG capsule Commonly known as:  BENTYL Take 10 mg by mouth every evening.   HYDROcodone-acetaminophen 5-325 MG tablet Commonly known as:  NORCO Take 1 tablet by mouth every 6 (six) hours as needed for moderate pain.   levothyroxine 88 MCG  tablet Commonly known as:  SYNTHROID, LEVOTHROID Take 88 mcg by mouth daily before breakfast.   LORazepam 0.5 MG tablet Commonly known as:  ATIVAN Take 0.5 mg by mouth every evening.   MAGNESIUM PO Take by mouth.   methocarbamol 500 MG tablet Commonly known as:  ROBAXIN Take 1 tablet (500 mg total) by mouth 3 (three) times daily as needed.   omeprazole 40 MG capsule Commonly known as:  PRILOSEC Take 40 mg by mouth daily.   PROBIOTIC PO Take 1 tablet by mouth daily.   promethazine 12.5 MG tablet Commonly known as:  PHENERGAN Take 12.5 mg by mouth every 6 (six) hours as needed for nausea or vomiting.   VENTOLIN HFA 108 (90 Base) MCG/ACT inhaler Generic drug:  albuterol Ventolin HFA 90 mcg/actuation aerosol inhaler   VITAMIN C PO Take by mouth.   Vitamin D 2000 units Caps Take 2,000 Units by mouth daily.       Known medication allergies: Allergies  Allergen Reactions  . Neurontin [Gabapentin] Other (See Comments)    Severe falls   . Ciprofloxacin Diarrhea  . Codeine Nausea And Vomiting  . Oxycodone Nausea And Vomiting  . Tramadol     REACTION: GI discomfort with high doses  . Cyclobenzaprine Palpitations  . Sulfa Antibiotics Rash     Physical examination: Blood pressure 130/60, pulse 72, temperature 97.8 F (36.6 C), temperature source Oral, resp. rate (!) 24, height 5\' 2"  (1.575 m), weight 147 lb 9.6 oz (67 kg).  General: Alert, interactive, in no acute distress. HEENT: PERRLA, TMs pearly gray, turbinates moderately edematous with clear discharge, post-pharynx non erythematous,  +cobblestoning. Neck: Supple without lymphadenopathy. Lungs: Clear to auscultation without wheezing, rhonchi or rales. {no increased work of breathing.  Constant coughing throughout encounter CV: Normal S1, S2 without murmurs. Abdomen: Nondistended, nontender. Skin: Warm and dry, without lesions or rashes. Extremities:  No clubbing, cyanosis or edema. Neuro:   Grossly intact.  Diagnositics/Labs:  Imaging:  CXR from 09/01/15 showing "small hernia, no acute abnormalities." CT chest from 12/22/15 showing "near complete resolution of previously seen right upper lobe infiltrate.  Only a minimal amount of residual scarring is sen.  No mass lesion is noted." Spirometry: FEV1: 1.75L  85%, FVC: 2.27L  83%, ratio consistent with nonobstructive pattern  Allergy testing: deferred due to continued cough  Assessment and plan:   Allergic rhinitis with PND    - previous history of sensitivity to aeroallergens (dust mites, hay, roaches)      - will repeat allergy testing today vis serum IgE levels to determine if you are still sensitive to allergens or possibly picked up new allergens    - your symptoms of throat scratchiess, lump sensation in throat, runny nose and cough most likely is related to Post-nasal drainage.      Start nasal antihistamine, Azelastine, 2 sprays each nostril twice a day.  Demonstrated proper nasal spray technique.    GERD   - recommend taking omeprazole daily for next 2-3 weeks.  If she finds cough symptoms improved would then recommend continued daily use.  She does not feel that reflux is a large component of symptoms currently.     - head of bed is increased already  Asthma/COPD    - symptoms at this time do not appear to be asthma and/or COPD in nature.  You have tried several different asthma management medications as well as COPD medications with minimal relief of symptoms.      -have access to  albuterol inhaler 2 puffs every 4-6 hours as needed for cough/wheeze/shortness  of breath/chest tightness.  May use 15-20 minutes prior to activity.   Monitor frequency of use.      - if above nasal spray is not effective consider trialing Trelegy.    Follow-up 2-3 months or sooner if needed   I appreciate the opportunity to take part in [place] care. Please do not hesitate to contact me with questions.  Sincerely,   Prudy Feeler, MD Allergy/Immunology [facility]

## 2017-03-12 NOTE — Patient Instructions (Addendum)
Allergic rhinitis    - previous history of sensitivity to aeroallergens (dust mites, hay, roaches)      - will repeat allergy testing today to determine if you are still sensitive to allergens or possibly picked up new allergens    - your symptoms of throat scratchiess, lump sensation in throat, runny nose and cough most likely is related to Post-nasal drainage.      Start nasal antihistamine, Azelastine, 2 sprays each nostril twice a day.  Demonstrated proper nasal spray technique.    GERD   - recommend taking omeprazole daily for next 2-3 weeks   - head of bed is increased already  Asthma/COPD    - symptoms at this time do not appear to be asthma/COPD in nature.  You have tried several different Asthma management medications as well as COPD medications with minimal relief of symptoms.      -have access to albuterol inhaler 2 puffs every 4-6 hours as needed for cough/wheeze/shortness of breath/chest tightness.  May use 15-20 minutes prior to activity.   Monitor frequency of use.      - if above nasal spray is not effective consider trialing Trelegy.      - we will try to track down the chest CT imaging you have had done in the past year.   Follow-up 2-3 months or sooner if needed

## 2017-03-17 LAB — ALLERGENS, ZONE 2

## 2017-04-09 ENCOUNTER — Encounter: Payer: Self-pay | Admitting: Gastroenterology

## 2017-05-13 ENCOUNTER — Encounter: Payer: Self-pay | Admitting: Gastroenterology

## 2017-05-14 ENCOUNTER — Ambulatory Visit: Payer: Medicare Other | Admitting: Gastroenterology

## 2017-05-14 ENCOUNTER — Other Ambulatory Visit (HOSPITAL_COMMUNITY): Payer: Self-pay | Admitting: Orthopedic Surgery

## 2017-05-14 DIAGNOSIS — Z96652 Presence of left artificial knee joint: Secondary | ICD-10-CM

## 2017-05-15 ENCOUNTER — Other Ambulatory Visit (HOSPITAL_COMMUNITY): Payer: Self-pay | Admitting: Orthopedic Surgery

## 2017-05-15 DIAGNOSIS — Z96652 Presence of left artificial knee joint: Secondary | ICD-10-CM

## 2017-06-04 ENCOUNTER — Encounter: Payer: Self-pay | Admitting: Allergy

## 2017-06-04 ENCOUNTER — Ambulatory Visit (INDEPENDENT_AMBULATORY_CARE_PROVIDER_SITE_OTHER): Payer: Medicare Other | Admitting: Allergy

## 2017-06-04 VITALS — BP 120/78 | HR 84 | Resp 20

## 2017-06-04 DIAGNOSIS — J310 Chronic rhinitis: Secondary | ICD-10-CM | POA: Diagnosis not present

## 2017-06-04 DIAGNOSIS — R05 Cough: Secondary | ICD-10-CM

## 2017-06-04 DIAGNOSIS — R053 Chronic cough: Secondary | ICD-10-CM

## 2017-06-04 MED ORDER — IPRATROPIUM BROMIDE 0.06 % NA SOLN: NASAL | 5 refills | Status: DC

## 2017-06-04 NOTE — Progress Notes (Signed)
Follow-up Note  RE: [name] MRN: [id number] DOB: [birth year] Date of Office Visit: 06/04/2017   History of present illness: [name] is a 72 y.o. female presenting today for follow-up of rhinitis with postnasal drip.  She has a history of GERD and possible cough variant asthma.  She was last seen in the office on March 12, 2017 by myself.  At that time thought most of her symptoms are related to rhinitis and postnasal drip leading to cough.  I had her start on Astelin nasal spray which she does feel has been helpful however she normally forgets to take the evening dose.  She does feel that if she were to be consistent with taking the second dose she would have more improvement in her symptoms.  She does not feel that omeprazole may need difference in her symptoms but she stopped this.  She continues to state that she throat clears a lot as well as feels like there is something in her throat as well as having hoarseness. She does report the amount of coughing she is doing throughout the day makes her tired.  She denies any wheezing, chest tightness or difficulty breathing.  She has tried a variety of different asthma inhaler medications in the past which have never been helpful for her.  She states her current symptoms have been ongoing for the past 5 years or so.  She has been evaluated by pulmonary of last appointment in 2017 who thought she was having upper airway cough syndrome and less likely asthma.  The last imaging she had was in 2017 which was a chest CT done after having a pneumonia which did show resolving pneumonia. She is concerned that she could have some type of a respiratory infection that is keeping her with persistent symptoms.  She denies any fevers and cough is not productive.  She denies any night sweats or chills and has not had any significant weight loss recently.  She also has not had any recent travel or any exposure to known sick people.  Review of systems: Review  of Systems  Constitutional: Positive for malaise/fatigue. Negative for chills, fever and weight loss.  HENT: Negative for congestion, ear discharge, ear pain, nosebleeds, sinus pain and sore throat.   Eyes: Negative for pain, discharge and redness.  Respiratory: Positive for cough. Negative for sputum production, shortness of breath and wheezing.   Cardiovascular: Negative for chest pain.  Gastrointestinal: Negative for abdominal pain, constipation, diarrhea, heartburn, nausea and vomiting.  Musculoskeletal: Negative for joint pain.  Skin: Negative for itching and rash.  Neurological: Negative for headaches.    All other systems negative unless noted above in HPI  Past medical/social/surgical/family history have been reviewed and are unchanged unless specifically indicated below.  No changes  Medication List: Allergies as of 06/04/2017      Reactions   Neurontin [gabapentin] Other (See Comments)   Severe falls    Ciprofloxacin Diarrhea   Codeine Nausea And Vomiting   Oxycodone Nausea And Vomiting   Tramadol    REACTION: GI discomfort with high doses   Cyclobenzaprine Palpitations   Sulfa Antibiotics Rash      Medication List        Accurate as of 06/04/17 11:19 AM. Always use your most recent med list.          acetaminophen 500 MG tablet Commonly known as:  TYLENOL Take 1,000 mg by mouth every 8 (eight) hours as needed for mild pain.   aspirin  EC 81 MG tablet Take 81 mg by mouth daily as needed (pain).   azelastine 0.1 % nasal spray Commonly known as:  ASTELIN Use two sprays in each nostril twice daily as directed   dicyclomine 10 MG capsule Commonly known as:  BENTYL Take 10 mg by mouth every evening.   HYDROcodone-acetaminophen 5-325 MG tablet Commonly known as:  NORCO Take 1 tablet by mouth every 6 (six) hours as needed for moderate pain.   levothyroxine 88 MCG tablet Commonly known as:  SYNTHROID, LEVOTHROID Take 88 mcg by mouth daily before breakfast.     LORazepam 0.5 MG tablet Commonly known as:  ATIVAN Take 0.5 mg by mouth every evening.   MAGNESIUM PO Take by mouth.   methocarbamol 500 MG tablet Commonly known as:  ROBAXIN Take 1 tablet (500 mg total) by mouth 3 (three) times daily as needed.   PROBIOTIC PO Take 1 tablet by mouth daily.   promethazine 12.5 MG tablet Commonly known as:  PHENERGAN Take 12.5 mg by mouth every 6 (six) hours as needed for nausea or vomiting.   VENTOLIN HFA 108 (90 Base) MCG/ACT inhaler Generic drug:  albuterol Ventolin HFA 90 mcg/actuation aerosol inhaler   VITAMIN C PO Take by mouth.   Vitamin D 2000 units Caps Take 2,000 Units by mouth daily.       Known medication allergies: Allergies  Allergen Reactions  . Neurontin [Gabapentin] Other (See Comments)    Severe falls   . Ciprofloxacin Diarrhea  . Codeine Nausea And Vomiting  . Oxycodone Nausea And Vomiting  . Tramadol     REACTION: GI discomfort with high doses  . Cyclobenzaprine Palpitations  . Sulfa Antibiotics Rash     Physical examination: Blood pressure 120/78, pulse 84, resp. rate 20.  General: Alert, interactive, in no acute distress. HEENT: PERRLA, TMs pearly gray, turbinates moderately edematous with clear discharge, post-pharynx non erythematous, + cobblestoning. Neck: Supple without lymphadenopathy. Lungs: Clear to auscultation without wheezing, rhonchi or rales. {no increased work of breathing. CV: Normal S1, S2 without murmurs. Abdomen: Nondistended, nontender. Skin: Warm and dry, without lesions or rashes. Extremities:  No clubbing, cyanosis or edema. Neuro:   Grossly intact.  Diagnositics/Labs: Labs:  Component     Latest Ref Rng & Units 03/12/2017  D Pteronyssinus IgE     Class 0 kU/L <0.10  D Farinae IgE     Class 0 kU/L <0.10  Cat Dander IgE     Class 0 kU/L <0.10  Dog Dander IgE     Class 0 kU/L <0.10  [place] Grass IgE     Class 0 kU/L <0.10  Timothy Grass IgE     Class 0 kU/L <0.10   Johnson Grass IgE     Class 0 kU/L <0.10  B[place]ia Grass IgE     Class 0 kU/L <0.10  Cockroach, American IgE     Class 0 kU/L <0.10  Penicillium Chrysogen IgE     Class 0 kU/L <0.10  Cladosporium Herbarum IgE     Class 0 kU/L <0.10  Aspergillus Fumigatus IgE     Class 0 kU/L <0.10  Mucor Racemosus IgE     Class 0 kU/L <0.10  Alternaria Alternata IgE     Class 0 kU/L <0.10  Stemphylium Herbarum IgE     Class 0 kU/L <0.10  Common Silver Wendee Copp IgE     Class 0 kU/L <0.10  Oak, White IgE     Class 0 kU/L <0.10  Elm, American IgE  Class 0 kU/L <0.10  Maple/Box Elder IgE     Class 0 kU/L <0.10  Hickory, White IgE     Class 0 kU/L <0.10  Amer Sycamore IgE Qn     Class 0 kU/L <0.10  White Mulberry IgE     Class 0 kU/L <0.10  Sweet gum IgE RAST Ql     Class 0 kU/L <0.10  Cedar, [place] IgE     Class 0 kU/L <0.10  Ragweed, Short IgE     Class 0 kU/L <0.10  Mugwort IgE Qn     Class 0 kU/L <0.10  Plantain, English IgE     Class 0 kU/L <0.10  Pigweed, Rough IgE     Class 0 kU/L <0.10  Sheep Sorrel IgE Qn     Class 0 kU/L <0.10  Nettle IgE     Class 0 kU/L <0.10    Spirometry: FEV1: 1.89L  92%, FVC: 2.73L  100%, ratio consistent with Nonobstructive pattern  Assessment and plan:   Non-allergic rhinitis    - allergy testing by serum IgE was negative    - your symptoms of throat scratchiness, lump sensation in throat, runny nose and cough most likely is related to significant post-nasal drainage.      Continue nasal antihistamine, Azelastine, 2 sprays each nostril twice a day.  Advised to set an alarm so she does not forget the evening dose.     Use nasal atrovent, 2 sprays each nostril up to 3-4 times a day as needed for runny nose    - Will place referral for ENT to evaluate sinuses and additional management of symptoms   Chronic cough    - believe cough is most likely attributed to significant post-nasal drainage.      - symptoms at this time do not appear to be  asthma/COPD in nature.  You have tried several different Asthma management medications as well as COPD medications with minimal relief of symptoms.  They also do not appear to be infectious as she does not have a significant productive cough, has been afebrile without any other signs or symptoms of infection    -have access to albuterol inhaler 2 puffs every 4-6 hours as needed for cough/wheeze/shortness of breath/chest tightness.  May use 15-20 minutes prior to activity.   Monitor frequency of use.      - given long duration of chronic cough (5 years) will re-evaluate lungs with chest CT which can help to evaluate for any infectious/anatomical changes in the lung parenchyma (i.e. bronchiectasis or any pulmonary nodules).     -Consider obtaining a sputum culture to rule out infectious disease  Follow-up 3 months or sooner if needed  I appreciate the opportunity to take part in [place] care. Please do not hesitate to contact me with questions.  Sincerely,   Prudy Feeler, MD Allergy/Immunology [facility] [place]

## 2017-06-04 NOTE — Patient Instructions (Addendum)
Non-allergic rhinitis    - allergy testing by serum IgE was negative    - your symptoms of throat scratchiess, lump sensation in throat, runny nose and cough most likely is related to Post-nasal drainage.      Continue nasal antihistamine, Azelastine, 2 sprays each nostril twice a day.       Use nasal atrovent, 2 sprays each nostril up to 3-4 times a day for runny nose    - Will place referral for ENT to evaluate sinuses and additional management of symptoms   Chronic cough    - believe cough is most likely attributed to significant post-nasal drainage.      - symptoms at this time do not appear to be asthma/COPD in nature.  You have tried several different Asthma management medications as well as COPD medications with minimal relief of symptoms.      -have access to albuterol inhaler 2 puffs every 4-6 hours as needed for cough/wheeze/shortness of breath/chest tightness.  May use 15-20 minutes prior to activity.   Monitor frequency of use.      - given long duration of chronic cough will re-evaluate lungs with chest CT which can help to evaluate for any infectious/anatomical issues in the lungs.   Follow-up 3 months or sooner if needed

## 2017-06-10 ENCOUNTER — Telehealth: Payer: Self-pay | Admitting: *Deleted

## 2017-06-10 NOTE — Telephone Encounter (Signed)
Per Dr. Jeralyn Ruths request, I scheduled [name] for a Whole Body PET Scan. Her appt is Wednesday, June 12th at 6:45 arriving at 6:15 am. NPO after midnight, water allowed.  She is aware of her appt info.

## 2017-06-13 ENCOUNTER — Ambulatory Visit: Payer: Medicare Other | Admitting: Pulmonary Disease

## 2017-06-17 ENCOUNTER — Encounter: Payer: Self-pay | Admitting: Pulmonary Disease

## 2017-06-17 ENCOUNTER — Ambulatory Visit (INDEPENDENT_AMBULATORY_CARE_PROVIDER_SITE_OTHER): Payer: Medicare Other | Admitting: Pulmonary Disease

## 2017-06-17 VITALS — BP 120/62 | HR 75 | Temp 97.9°F | Ht 62.0 in | Wt 145.6 lb

## 2017-06-17 DIAGNOSIS — J45991 Cough variant asthma: Secondary | ICD-10-CM | POA: Diagnosis not present

## 2017-06-17 DIAGNOSIS — Z85820 Personal history of malignant melanoma of skin: Secondary | ICD-10-CM | POA: Insufficient documentation

## 2017-06-17 DIAGNOSIS — R9389 Abnormal findings on diagnostic imaging of other specified body structures: Secondary | ICD-10-CM | POA: Diagnosis not present

## 2017-06-17 DIAGNOSIS — K219 Gastro-esophageal reflux disease without esophagitis: Secondary | ICD-10-CM | POA: Diagnosis not present

## 2017-06-17 DIAGNOSIS — K449 Diaphragmatic hernia without obstruction or gangrene: Secondary | ICD-10-CM | POA: Diagnosis not present

## 2017-06-17 DIAGNOSIS — J683 Other acute and subacute respiratory conditions due to chemicals, gases, fumes and vapors: Secondary | ICD-10-CM | POA: Diagnosis not present

## 2017-06-17 DIAGNOSIS — J310 Chronic rhinitis: Secondary | ICD-10-CM | POA: Diagnosis not present

## 2017-06-17 HISTORY — DX: Personal history of malignant melanoma of skin: Z85.820

## 2017-06-17 MED ORDER — HYDROCODONE-HOMATROPINE 5-1.5 MG/5ML PO SYRP: 5.0000 mL | ORAL_SOLUTION | Freq: Three times a day (TID) | ORAL | 0 refills | Status: DC | PRN

## 2017-06-17 MED ORDER — TRAMADOL HCL 50 MG PO TABS: 50.0000 mg | ORAL_TABLET | Freq: Three times a day (TID) | ORAL | 0 refills | Status: DC | PRN

## 2017-06-17 NOTE — Progress Notes (Addendum)
Subjective:    Patient ID: [name], female    DOB: [birth year], 72 y.o.   MRN: [id number]  HPI  09/29/2013: f/u appt w/ PW>  Chief Complaint  Patient presents with  . Follow-up    c/o increased hoarseness, clearing throat a lot, not able to rest voice much due to company, sob-same,cough-yellow,no fcs, chest heavy ,diarrhea     F/u upper airway cyclical cough, no true asthma Pt was ok until 1.5 week ago: company ill, viral issues.  Now cough is productive clear to white. occ yellow. Notes pn drip severe. Throat is scratchy. Using the drops. 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 Now with acute right maxillary sinusitis with postnasal drip precipitating current cough paroxysms, note right external auditory canal occluded with cerumen    Plan> Need to clear her right ear of cerumen  Take azithromycin 250mg  Take two once then one daily until gone Take prednisone 10mg  Take 4 for two days three for two days two for two days one for two days Stay on flonase nasal spray Stay on chlor trimeton Use saline nasal spray three times daily Follow cough protocol with Delsym         11/24/2013: f/u appt w/ PW> Chief Complaint  Patient presents with  . 2 month follow up    Cough greatly improved while on cough protocol.  Notices cough worsens after decreasing delsym.  Cough prod over the past few days with white to yellow mucus.  No wheezing, chest tightnes, CP, or f/c/s.       Pt got better a while then got worse.  the patient is still on antihistamine and flonase.  Forgets the nasal spray.  Cannot get the delsym off. Got ears cleaned out.  Cough worse past two days yellow mucus.  No nasal d/c.  No postnasal drip. No GERD symptoms.  Pt with diarrhea for 1 year: goes 4-5x per day No wheezing, no dyspnea.     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 disease as a primary precipitating factor Note inhaled  steroids have not previously been of much benefit Acute tracheobronchitis currently    Plan> Stop protonix due to diarrhea Increase acidophilus to twice daily Cefuroxime 500mg  twice daily x 5days Prednisone 10mg  Take 4 for two days three for two days two for two days one for two days Benzonatate 1-2 every 4-6 hours as needed for cough and delsym as needed : per cough protocol Stay on chlorpheniramine/fluticasone USe flutter valve 3 - 4 x daily  ~  July 19, 2014:  28mo ROV & add-on appt w/ SN> her PCP is DrGrisso in [place]    72 y/o WF pt of PW w/ RADS, cough variant asthma, not on any regular inhalers...  Prev CT Chest 05/27/12 at [facility] (they report hx melanoma) showed scarring left lung base 7-2mm nodular component anteriorly is unchanged, no adenopathy, borderline heart size & tort Ao  CT Sinuses 10/23/12 was neg, clear sinuses...   Prev PFT> 06/03/13 showed FVC=2.75 (92%), FEV1=2.04 (89%), %1sec=74, mid-flows min reduced at 72% predicted; METHACHOLINE challenge was NEG...  Last CXR> 02/12/13 showed norm heart size, clear lungs, mild DD Tspine...  She had left TKR 09/4763 w/ complications => required hydrocodone for pain & she notes cough resolved (note- she tells me she is due to have a screw removed);  She presents on this occas w/ 9mo hx cough, mostly dry, raspy/ scratchy throat, min  clear whitish sput, no hemoptysis, only SOB occurs w/ the coughing paraoxysms & the cough was worse at night- assoc w/ reflux symptoms & she reports Hx HH surg by DrSmith in [facility] 02/2012 but notes that "it's back" and may need GI eval...     EXAM reveals Afeb, VSS, O2sat=99% on RA;  HEENT- neg, mallampati2;  Chest- clear x dry cough, weak raspy voice;  Heart- RR w/o m/r/g;  Abd- soft, nontender;  Ext- w/o c/c/e...  CXR today 07/19/14 showed clear lungs, no infiltrates etc... IMP/PLAN>>  Jayline has RADS/ AB with prob nocturnal reflux trigger and we discussed a vigorous antireflux regimen- Protonix40  before dinner, NPO after dinner, elev HOB, Pepcid40 qhs;  In addition- trial Dulera200 (not covered, therefore go w/ SYMBICORT160- 2spBid w/ spacer, plus Tussionex, etc...  ROV recheck in 6-8 weeks... F ~  September 15, 2014:  71mo ROV w/ SN>  Jlyn stopped the Symbicort due to price & never filled the Tussionex, but she has followed a vigorous antireflux regimen w/ Protonix40 before dinner, NPO after dinner, elev HOB 6", & Pepcid40 before bed w/sip; she notes that her cough is improved, no sput/ no blood/ denies SOB/ CP/ etc & her voice is better too;  She has prev tried Advair as well & it didn't seem to help much she says;  We discussed continuing the vigorous antireflux regimen & try BREO one inhalation daily- asked to get a copy of her prescription drug formulary for [place] to review... She reports having surg on her left heel w/ a melanoma removed by [facility], she tells me that "they got it all" and the kind that she had doesn't spread internally so she doesn't need further surg or referral to [facility] (no data avil to [place] here in [place])...  EXAM reveals Afeb, VSS, O2sat=98% on RA;  HEENT- neg, mallampati2;  Chest- clear w/o w/r/r;  Heart- RR w/o m/r/g;  Abd- soft, nontender;  Ext- w/o c/c/e...    RADS ==> try a diff ICS/LABA in BREO one inhalation daily & she will get copy of her Formulary...    Hx Cough variant asthma ==> ok to use DELSYM, TESSALON, MUCINEX OTC as needed...    Laryngopharyngeal reflux ==> she will continue vigorous antireflux regimen w/ PPI, NPO after dinner, H2blocker at bedtime, elev HOB etc...    HH/ GERD ==> may need further GI eval by her gastroenterologist, if not responding...  We reviewed prob list, meds, xrays and labs> she'll get 2016 Flu vaccine from her PCP... IMP/PLAN>>  Dola is improved on her vigorous antireflux regimen & she will continue to apply this every night;  We decided to try [facility] & she will get a copy of her prescription drug formulary fior [place] to review;  ROV  in 75mo and sooner if needed prn...  ~  January 17, 2015:  43mo ROV & Tameya had another URI (sinus & ear infection) treated by her PCP w/ Levaquin;  She is c/o cough, small amt clear sput, no hemoptysis but w/ post nasal drip & chest congestion;  She has hx RADS and cough variant asthma, plus LPR/ GERD;  Med list indicates Symbicort (no ttaking- $$), Singulair10, Flonase, Tessalon, Dexilant, Pepcid... She did not contact her insurance company for a copy of her formulary for [place] to review- she doesn't recall getting BREO last visit...  EXAM reveals Afeb, VSS, O2sat=98% on RA;  HEENT- neg, mallampati2;  Chest- clear w/o w/r/r;  Heart- RR w/o m/r/g;  Abd- soft, nontender;  Ext- w/o c/c/e... IMP/PLAN>>     Margert has an acute exac of her RADS, sl better after Levaquin from PCP but lingering congestion, cough, some SOB=> c/w airway inflammation & we discussed the need for anti-inflamm rx w/ Medrol & inhaled Breo- hopefully her new insurance ([employer]) will cover thi med...     RADS ==> discussed the need for regular meds to prevent exacerbations; we will treat this exac w/ Depo80 + Medrol dosepak;  She still needs to get a copy of her prescription drug formulary, we will try the BREO- 1/d...    Hx Cough variant asthma ==> needs to take the BREO daily; ok to use DELSYM, TESSALON, [place] OTC as needed...    Laryngopharyngeal reflux ==> she will continue vigorous antireflux regimen w/ PPI, NPO after dinner, H2blocker at bedtime, elev HOB etc...    HH/ GERD ==> she has seen her gastroenterologist & they switched her to [place]...   ~  June 17, 2017:  81mo ROV & pulmonary follow up visit>  Pt is referred back to [facility] by Allergist- DrPadgett in [place] for cough and an abnormal CT chest w/ nodule;  I last saw Hedaya 01/17/15 as above w/ RADS, cough variant asthma, GERD & LPR;  She also has a hx of an abn CT Chest dating back to 05/2012 (CT in [place]) showing scarring left lung base w/ 7-32mm nodular  component anteriorly- unchanged, no adenopathy, borderline heart size & tort Ao... We reviewed the following interval records avail to [place] in Epic EMR>      She saw PCP- DrGrisso on 1/22 ([facility] & 02/08/17>  C/o chest congestion & cough; coughs thru the night, treated w/ ZPak, Pred, Tessalon & referred to DrKozlow...     She saw ALLERGY- DrPadgett in [place] on 06/04/17>  Followed for rhinitis/ throat clearing/ PND/ hoarseness/ cough x yrs, Hx HH repair x2, GERD & cough variant asthma, on Astelin/ Ventolin/ Norco5,  she stopped PPI,  Spirometry was normal (no obstructive dis) and RAST panel NEG;  They rechecked CT Chest- done 06/07/17- showing several tiny pulm nodules, largest lesion is ~37mm in anteromedial LLL & they rec PET scan=> NEG, no hypermetabolic activity...    She is followed by [facility] Passey- DrOlin, DrBrooks, DrRamos>  Hx L-TKR 2016 w/ loosening, R-knee OA, cervical radiculopathy w/ DDD; she had MRI & Nuclear bone scan We reviewed the following medical problems today>      RADS ==> Shalina is NOT on any regular meds/ inhalers, just using prn Ventolin-HFA and Astelin/ Atrovent nasal sprays; she notes cough daily- all day & all night she says, sm amt clear sput w/o color or blood, denies SOB- min DOE (no changes), no CP/ palpit/ edema, and no f/c/s etc...    Hx Cough variant asthma ==> she does not feel that any inhaler has helped her much but doesn't recall the [facility] after her last OV; she has tried DELSYM, [place], [place] OTC as needed...    Episode of pneumonia 2017 and Abn CT chest w/ several small nodules and one 8-95mm LLL lesion>  Serial CT scans w/o progressive incr size and PET NEG 06/2017...    Laryngopharyngeal reflux ==> she is clearly NOT on a vigorous antireflux regimen & does not recall our prev recommendation for PPI (before dinner), NPO after dinner, H2blocker at bedtime, elev HOB 6".    HH/ GERD ==> she has seen a gastroenterologist but she doesn't know who & no notes avail to  review...  GI issues include:  HH, s/p surg x2; on Omep Qam, Bentyl10, Probiotic    Medical issues>  CAD, HL, Hypothyroid, DJD, Osteoporosis, Vit B12 & VitD deficiencies, Hx Melanoma, & anxiety;  on Synthroid, Hydrocodone, Robaxin, Ativan, several vit supplements... EXAM reveals Afeb, VSS, O2sat=94% on RA;  HEENT- neg, mallampati2;  Chest- clear w/o w/r/r;  Heart- RR w/o m/r/g;  Abd- soft, nontender;  Ext- w/o c/c/e...  NOTE:  Interval CXR done 09/01/15 in [facility] showed ill-defined RUL opac c/w pneumonia + stable left base pleural thickening...  Spirometry 06/04/17 by DrPadgett>  FVC=2.73 (100%), FEV1=1.89 (92%), %1sec=69 and mid-flows=65% predicted c/w some small airways disease...  CT Chest 06/07/17 in [place], read by DrSMaynard, showing heart size at upper lim of normal, no adenopathy, small HH & surg changes at the esoph hiatus, mult pulm nodules-- largest is 31mm in anteromedial LLL (2 other 3mm & 68mm LLL nodules, 65mm Lingular nodule, & 77mm RLL nodule identified); s/p GB, mod degen changes in lumbar spine...   PET scan done 06/12/17 in [place], read by DrMBlietz, showing NO hypermetabolic nodes & NO hypermetabolic pulm nodules; NOTE: THE 14mm LLL NODULE IS UNCHANGED FROM 12/23/15 CT CHEST AND IS CONSIDERED BENIGN, & additional scattered tiny pulm nodules are too small for PET resolution;  Abd is NEG x atherosclerotic calcif in Ao...   SPUTUM for C&S, AFB, Fungi 06/22/17>  NTF only, Neg AFB smear, Neg Fungal smear... IMP/PLAN>>  Problems as noted- cough is her CC but she was mainly concerned about the nodule;  The cough has been long standing & most c/w reflux induced so we stressed the need for a vigorous antireflux regimen w/ Omep40 taken 30 min before the eve meal, NPO after dinner, elev HOB 6"; use TRAMADOL50 Tid for the cough + Rx for generic Hycodan one tsp tid prn;  She is concerned about poss infection so we obtained an expectorated sput specimen for C&S, AFB, & Fungi... Finally we  reviewed the CT Chest & PET scan- CT Chest 6/19 showed 8-14mm LLL nodule that appears unchanged from prev CT in PACS dated 5/14 and 12/17, therefore likely benign; the NEG PET scan is additionally reassuring; there are several other tiny nodules that would be reasonable to f/u w/ another CT scan in 42yr...  NOTE:  >50% of this 45 min ROV was spent face to face, reviewing scans, in counseling & coordination of care...  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    Past Medical History:  Diagnosis Date  . Abnormal weight loss   . Anxiety    antianxiety med. usually at night but can take during the day for anxiety  . Arthritis    both knees & hands  also the back & neck  . Asthma   . Atrial fibrillation ([place])    pt. reports that [facility] that she had a "slight afib.", no further test needed (01/07/14 cardiology note does not mention any afib, just poor anterior r wave progression)  . Chronic cough   . Complication of anesthesia   . Cough    chronic, pt. reports for 3.5 yrs.   . Diaphragmatic hernia   . Diarrhea    pt. reports that she has irritable bowel, loose stool & diarrhea both about 6 times per day, right after she eats.  . Endometriosis   . Gastric polyp   . Gastroenteritis   . GERD (gastroesophageal reflux disease)   . GERD (gastroesophageal reflux disease)   .  History of hiatal hernia    pt. has been told that the hiatal hernia has reappeared  . Hypercholesteremia   . Hyperlipidemia   . Hypertension    pt. reports that she use to take antihtn med, but reports since weight loss she has been able to come off.    . Hypothyroidism   . IBS (irritable bowel syndrome)    with diarrhea  . Insomnia   . Melanoma ([place]) 2011   heel  . OSA (obstructive sleep apnea)    does not tolerate CPAP , pt. reports that its severe, but doesn't use it anymore, since weight loss.   Study done in [place], not sure where.   .  Osteoarthritis   . PONV (postoperative nausea and vomiting)   . Vitamin B 12 deficiency   . Vitamin D deficiency     Past Surgical History:  Procedure Laterality Date  . ABDOMINAL HYSTERECTOMY    . APPENDECTOMY    . CHOLECYSTECTOMY    . COLONOSCOPY  05/09/2015   Colonic Polyps, moderate sigmoid diverticulosis. Bx: Tubular Adenomas. Negative  . EYE SURGERY     cataracts removed / w IOL  . GALLBLADDER SURGERY    . HIATAL HERNIA REPAIR  2/14  . KNEE ARTHROSCOPY     both knees for torn cartilage   . OOPHORECTOMY    . TONSILLECTOMY    . TOTAL KNEE ARTHROPLASTY Left 01/29/2014   Procedure: LEFT TOTAL KNEE ARTHROPLASTY MCL REPAIR;  Surgeon: Augustin Schooling, MD;  Location: [place];  Service: Orthopedics;  Laterality: Left;  Marland Kitchen VESICOVAGINAL FISTULA CLOSURE W/ TAH      Outpatient Encounter Medications as of 06/17/2017  Medication Sig  . acetaminophen (TYLENOL) 500 MG tablet Take 1,000 mg by mouth every 8 (eight) hours as needed for mild pain.  Marland Kitchen aspirin EC 81 MG tablet Take 81 mg by mouth daily as needed (pain).  Marland Kitchen azelastine (ASTELIN) 0.1 % nasal spray Use two sprays in each nostril twice daily as directed  . Cholecalciferol (VITAMIN D) 2000 units CAPS Take 2,000 Units by mouth daily.  Marland Kitchen HYDROcodone-acetaminophen (NORCO) 5-325 MG per tablet Take 1 tablet by mouth every 6 (six) hours as needed for moderate pain.  Marland Kitchen ipratropium (ATROVENT) 0.06 % nasal spray Use two sprays in each nostril 3-4 times daily to dry up nose  . levothyroxine (SYNTHROID, LEVOTHROID) 88 MCG tablet Take 88 mcg by mouth daily before breakfast.  . LORazepam (ATIVAN) 0.5 MG tablet Take 0.5 mg by mouth every evening.   Marland Kitchen MAGNESIUM PO Take by mouth.  . methocarbamol (ROBAXIN) 500 MG tablet Take 1 tablet (500 mg total) by mouth 3 (three) times daily as needed. (Patient taking differently: Take 500 mg by mouth at bedtime. )  . promethazine (PHENERGAN) 12.5 MG tablet Take 12.5 mg by mouth every 6 (six) hours as needed for  nausea or vomiting.  . [DISCONTINUED] albuterol (VENTOLIN HFA) 108 (90 Base) MCG/ACT inhaler Ventolin HFA 90 mcg/actuation aerosol inhaler  . [DISCONTINUED] Ascorbic Acid (VITAMIN C PO) Take by mouth.  . [DISCONTINUED] dicyclomine (BENTYL) 10 MG capsule Take 10 mg by mouth every evening.  . [DISCONTINUED] Probiotic Product (PROBIOTIC PO) Take 1 tablet by mouth daily.  Marland Kitchen HYDROcodone-homatropine (HYCODAN) 5-1.5 MG/5ML syrup Take 5 mLs by mouth 3 (three) times daily as needed for cough. As directed  . traMADol (ULTRAM) 50 MG tablet Take 1 tablet (50 mg total) by mouth 3 (three) times daily as needed. As directed   No facility-administered encounter  medications on file as of 06/17/2017.     Allergies  Allergen Reactions  . Neurontin [Gabapentin] Other (See Comments)    Severe falls   . Ciprofloxacin Diarrhea  . Codeine Nausea And Vomiting  . Oxycodone Nausea And Vomiting  . Tramadol     REACTION: GI discomfort with high doses  . Cyclobenzaprine Palpitations  . Sulfa Antibiotics Rash    Current Medications, Allergies, Past Medical History, Past Surgical History, Family History, and Social History were reviewed in [employer] record.   Review of Systems  Constitutional:   No  weight loss, night sweats,  Fevers, chills, ++fatigue, lassitude. HEENT:   No headaches,  Difficulty swallowing,  Tooth/dental problems,  +++Sore throat,                No sneezing, itching, ear ache, nasal congestion,+++ post nasal drip,  CV:  No chest pain,  Orthopnea, PND, swelling in lower extremities, anasarca, dizziness, palpitations GI  No heartburn, indigestion, abdominal pain, nausea, vomiting, diarrhea, change in bowel habits, loss of appetite Resp: No shortness of breath with exertion or at rest.  No excess mucus, notes  productive cough,  Notes non-productive cough,  No coughing up of blood.  No change in color of mucus.  No wheezing.  No chest wall deformity Skin: no rash or  lesions. GU: no dysuria, change in color of urine, no urgency or frequency.  No flank pain. MS:  No joint pain or swelling.  No decreased range of motion.  No back pain. Psych:  No change in mood or affect. No depression or anxiety.  No memory loss.     Objective:   Physical Exam  Vitals:   06/17/17 1049  BP: 120/62  Pulse: 75  Temp: 97.9 F (36.6 C)  TempSrc: Oral  SpO2: 94%  Weight: 145 lb 9.6 oz (66 kg)  Height: 5\' 2"  (1.575 m)   Gen: Pleasant, well-nourished, in no distress,  normal affect ENT: No lesions,  mouth clear,  oropharynx clear, prominent postnasal drip,  Neck: No JVD, no TMG, no carotid bruits Lungs: No use of accessory muscles, no dullness to percussion, clear w/o w/r/r and voice is improved back to nl. Cardiovascular: RRR, heart sounds normal, no murmur or gallops, no peripheral edema Abdomen: soft and NT, no HSM,  BS normal Musculoskeletal: No deformities, no cyanosis or clubbing Neuro: alert, non focal Skin: Warm, no lesions or rashes     Assessment & Plan:    IMP/PLAN >>     RADS ==> Loleta is NOT on any regular meds/ inhalers; she notes cough daily- all day & all night she says, sm amt clear sput w/o color or blood, denies SOB- min DOE (no changes), no CP/ palpit/ edema, and no f/c/s etc...    Hx Cough variant asthma ==> she does not feel that any inhaler has helped her much but doesn't recall the [facility] after her last OV; she has tried DELSYM, [place], [place] OTC as needed...    Episode of pneumonia 2017 and Abn CT chest w/ several small nodules and one 8-51mm LLL lesion>  Serial CT scans w/o progressive incr size and PET NEG 06/2017...    Laryngopharyngeal reflux ==> she is clearly NOT on a vigorous antireflux regimen & does not recall our prev recommendation for PPI (before dinner), NPO after dinner, H2blocker at bedtime, elev HOB 6".    HH/ GERD ==> she has seen a gastroenterologist but she doesn't know who & no notes avail  to review...  GI issues  include: HH, s/p surg x2; on Omep Qam, Bentyl10, Probiotic    Medical issues>  CAD, HL, Hypothyroid, DJD, Osteoporosis, Vit B12 & VitD deficiencies, Hx Melanoma, & anxiety;  on Synthroid, Hydrocodone, Robaxin, Ativan, several vit supplements...  09/15/14>   Myleen is improved on her vigorous antireflux regimen & she will continue to apply this every night;  We decided to try [facility] & she will get a copy of her prescription drug formulary fior [place] to review;   01/17/15>   Khai has an acute exac of her RADS, sl better after Levaquin from PCP but lingering congestion, cough, some SOB=> c/w airway inflammation & we discussed the need for anti-inflamm rx w/ Medrol & inhaled Breo- hopefully her new insurance ([employer]) will cover thi med.. 06/17/17>   Problems as noted- cough is her CC but she was mainly concerned about the nodule;  The cough has been long standing & most c/w reflux induced so we stressed the need for a vigorous antireflux regimen w/ Omep40 taken 30 min before the eve meal, NPO after dinner, elev HOB 6"; use TRAMADOL50 Tid for the cough + Rx for generic Hycodan one tsp tid prn;  She is concerned about poss infection so we obtained an expectorated sput specimen for C&S, AFB, & Fungi... Finally we reviewed the CT Chest & PET scan- CT Chest 6/19 showed 8-19mm LLL nodule that appears unchanged from prev CT in PACS dated 5/14 and 12/17, therefore likely benign; the NEG PET scan is additionally reassuring; there are several other tiny nodules that would be reasonable to f/u w/ another CT scan in 20yr...    Patient's Medications  New Prescriptions   HYDROCODONE-HOMATROPINE (HYCODAN) 5-1.5 MG/5ML SYRUP    Take 5 mLs by mouth 3 (three) times daily as needed for cough. As directed   TRAMADOL (ULTRAM) 50 MG TABLET    Take 1 tablet (50 mg total) by mouth 3 (three) times daily as needed. As directed  Previous Medications   ACETAMINOPHEN (TYLENOL) 500 MG TABLET    Take 1,000 mg by mouth every 8 (eight)  hours as needed for mild pain.   ASPIRIN EC 81 MG TABLET    Take 81 mg by mouth daily as needed (pain).   AZELASTINE (ASTELIN) 0.1 % NASAL SPRAY    Use two sprays in each nostril twice daily as directed   CHOLECALCIFEROL (VITAMIN D) 2000 UNITS CAPS    Take 2,000 Units by mouth daily.   HYDROCODONE-ACETAMINOPHEN (NORCO) 5-325 MG PER TABLET    Take 1 tablet by mouth every 6 (six) hours as needed for moderate pain.   IPRATROPIUM (ATROVENT) 0.06 % NASAL SPRAY    Use two sprays in each nostril 3-4 times daily to dry up nose   LEVOTHYROXINE (SYNTHROID, LEVOTHROID) 88 MCG TABLET    Take 88 mcg by mouth daily before breakfast.   LORAZEPAM (ATIVAN) 0.5 MG TABLET    Take 0.5 mg by mouth every evening.    MAGNESIUM PO    Take by mouth.   METHOCARBAMOL (ROBAXIN) 500 MG TABLET    Take 1 tablet (500 mg total) by mouth 3 (three) times daily as needed.   PROMETHAZINE (PHENERGAN) 12.5 MG TABLET    Take 12.5 mg by mouth every 6 (six) hours as needed for nausea or vomiting.  Modified Medications   No medications on file  Discontinued Medications   ALBUTEROL (VENTOLIN HFA) 108 (90 BASE) MCG/ACT INHALER    Ventolin HFA 90 mcg/actuation  aerosol inhaler   ASCORBIC ACID (VITAMIN C PO)    Take by mouth.   DICYCLOMINE (BENTYL) 10 MG CAPSULE    Take 10 mg by mouth every evening.   PROBIOTIC PRODUCT (PROBIOTIC PO)    Take 1 tablet by mouth daily.

## 2017-06-17 NOTE — Patient Instructions (Signed)
Today we updated your med list in our EPIC system...    Continue your current medications the same...  We discussed checking a good representative early AM sputum specimen for culture...    Please bring the specimen to our lab when collected...  We reviewed the needed antireflux regimen--    Take the Omeprazole 40mg  about 31min prior to the evening meal...    Do not eat or drink after dinner in the eve...    Elevate the head of your bed ~6"   We also discussed cough suppression w/ TRAMADOL 50mg  tab 3 times daily     Plus a cough syrup-- HYCODAN (generic) one tsp 3 times daily (do these regularly over the next month)...  I concur w/ the ENT consultation to check your posterior pharynx and larynx  Call for any questions...  Let's plan a follow up visit in 37mo, sooner if needed for problems.Marland KitchenMarland Kitchen

## 2017-06-18 ENCOUNTER — Encounter

## 2017-06-18 ENCOUNTER — Encounter: Payer: Self-pay | Admitting: Gastroenterology

## 2017-06-18 ENCOUNTER — Ambulatory Visit (INDEPENDENT_AMBULATORY_CARE_PROVIDER_SITE_OTHER): Payer: Medicare Other | Admitting: Gastroenterology

## 2017-06-18 VITALS — BP 120/68 | Ht 62.5 in | Wt 146.2 lb

## 2017-06-18 DIAGNOSIS — R197 Diarrhea, unspecified: Secondary | ICD-10-CM

## 2017-06-18 DIAGNOSIS — R159 Full incontinence of feces: Secondary | ICD-10-CM

## 2017-06-18 MED ORDER — DICYCLOMINE HCL 10 MG PO CAPS: 10.0000 mg | ORAL_CAPSULE | Freq: Three times a day (TID) | ORAL | 0 refills | Status: DC

## 2017-06-18 MED ORDER — DICYCLOMINE HCL 10 MG PO CAPS: 10.0000 mg | ORAL_CAPSULE | Freq: Two times a day (BID) | ORAL | 0 refills | Status: DC

## 2017-06-18 NOTE — Patient Instructions (Addendum)
If you are age 71 or older, your body mass index should be between 23-30. Your Body mass index is 26.31 kg/m. If this is out of the aforementioned range listed, please consider follow up with your Primary Care Provider.  If you are age 23 or younger, your body mass index should be between 19-25. Your Body mass index is 26.31 kg/m. If this is out of the aformentioned range listed, please consider follow up with your Primary Care Provider.   We have sent the following medications to your pharmacy for you to pick up at your convenience: Bentyl 10 mg twice daily x 1 month  Stop Magnesium Take Calcium.  Start doing Kegal exercises.   Thank you,  Dr. Jackquline Denmark

## 2017-06-18 NOTE — Progress Notes (Signed)
Chief Complaint: Incontinence  Referring Provider:  Raina Mina., MD      ASSESSMENT AND PLAN;   #1. Diarrhea -with element of postcholecystectomy diarrhea.  Negative colonoscopy except for small tubular adenomas with negative random colonic biopsies 05/09/2015, negative small bowel biopsies for celiac 2013, negative CT scan 08/26/2015, negative stool studies in the past  #2. Fecal Incontinence -Stop Mg -Restart Bentyl 10 mg p.o. twice daily.  If no improvement in 2 weeks, we will increase Bentyl to 10 mg p.o. 4 times daily and restart Lomotil 1 tablet p.o. 3 times daily as needed..  If still there is no improvement, will give her a trial of Colestid (she did not tolerate cholestyramine in the past because of bad after taste). -Start calcium supplements 1/day -Kegel's exercises -explained and given information. -Call us in 2 weeks  #3. History of colonic polyps 05/2015 with family history of colon cancer (mother).  Next colonoscopy due 05/2018  HPI:    [name] is a 72 y.o. female  For diarrhea with occasional incontinence. She is also having urinary incontinence. She has softer looser bowel movements ever since her gallbladder has been taken out. No melena or hematochezia. Has chronic abdominal bloating which is somewhat worse after hiatal hernia repair. No fever or chills.   Past Medical History:  Diagnosis Date  . Abnormal weight loss   . Anxiety    antianxiety med. usually at night but can take during the day for anxiety  . Arthritis    both knees & hands  also the back & neck  . Asthma   . Atrial fibrillation ([place])    pt. reports that [facility] that she had a "slight afib.", no further test needed (01/07/14 cardiology note does not mention any afib, just poor anterior r wave progression)  . Chronic cough   . Complication of anesthesia   . Cough    chronic, pt. reports for 3.5 yrs.   . Diaphragmatic hernia   . Diarrhea    pt. reports that she has irritable  bowel, loose stool & diarrhea both about 6 times per day, right after she eats.  . Endometriosis   . Gastric polyp   . Gastroenteritis   . GERD (gastroesophageal reflux disease)   . GERD (gastroesophageal reflux disease)   . History of hiatal hernia    pt. has been told that the hiatal hernia has reappeared  . Hypercholesteremia   . Hyperlipidemia   . Hypertension    pt. reports that she use to take antihtn med, but reports since weight loss she has been able to come off.    . Hypothyroidism   . IBS (irritable bowel syndrome)    with diarrhea  . Insomnia   . Melanoma ([place]) 2011   heel  . OSA (obstructive sleep apnea)    does not tolerate CPAP , pt. reports that its severe, but doesn't use it anymore, since weight loss.   Study done in [place], not sure where.   . Osteoarthritis   . PONV (postoperative nausea and vomiting)   . Vitamin B 12 deficiency   . Vitamin D deficiency     Past Surgical History:  Procedure Laterality Date  . ABDOMINAL HYSTERECTOMY    . APPENDECTOMY    . CHOLECYSTECTOMY    . COLONOSCOPY  05/09/2015   Colonic Polyps, moderate sigmoid diverticulosis. Bx: Tubular Adenomas. Negative  . EYE SURGERY     cataracts removed / w IOL  . GALLBLADDER SURGERY    .  HIATAL HERNIA REPAIR  2/14  . HIATAL HERNIA REPAIR  2018   revision of 2014  . KNEE ARTHROSCOPY     both knees for torn cartilage   . OOPHORECTOMY    . TONSILLECTOMY    . TOTAL KNEE ARTHROPLASTY Left 01/29/2014   Procedure: LEFT TOTAL KNEE ARTHROPLASTY MCL REPAIR;  Surgeon: Augustin Schooling, MD;  Location: [facility];  Service: Orthopedics;  Laterality: Left;  Marland Kitchen VESICOVAGINAL FISTULA CLOSURE W/ TAH      Family History  Problem Relation Age of Onset  . Emphysema Father        smoked  . Heart disease Father   . Asthma Mother   . Colon cancer Mother   . Asthma Brother   . Breast cancer Sister   . Breast cancer Maternal Grandmother     Social History   Tobacco Use  . Smoking status: Never Smoker    . Smokeless tobacco: Never Used  Substance Use Topics  . Alcohol use: No    Comment: rare for holiday consumption only  . Drug use: No    Current Outpatient Medications  Medication Sig Dispense Refill  . acetaminophen (TYLENOL) 500 MG tablet Take 1,000 mg by mouth every 8 (eight) hours as needed for mild pain.    Marland Kitchen aspirin EC 81 MG tablet Take 81 mg by mouth daily as needed (pain).    Marland Kitchen azelastine (ASTELIN) 0.1 % nasal spray Use two sprays in each nostril twice daily as directed 30 mL 5  . Cholecalciferol (VITAMIN D) 2000 units CAPS Take 2,000 Units by mouth daily.    Marland Kitchen HYDROcodone-acetaminophen (NORCO) 5-325 MG per tablet Take 1 tablet by mouth every 6 (six) hours as needed for moderate pain. 60 tablet 0  . HYDROcodone-homatropine (HYCODAN) 5-1.5 MG/5ML syrup Take 5 mLs by mouth 3 (three) times daily as needed for cough. As directed 473 mL 0  . ipratropium (ATROVENT) 0.06 % nasal spray Use two sprays in each nostril 3-4 times daily to dry up nose 15 mL 5  . levothyroxine (SYNTHROID, LEVOTHROID) 88 MCG tablet Take 88 mcg by mouth daily before breakfast.    . LORazepam (ATIVAN) 0.5 MG tablet Take 0.5 mg by mouth every evening.   0  . MAGNESIUM PO Take by mouth.    . methocarbamol (ROBAXIN) 500 MG tablet Take 1 tablet (500 mg total) by mouth 3 (three) times daily as needed. (Patient taking differently: Take 500 mg by mouth at bedtime. ) 60 tablet 1  . promethazine (PHENERGAN) 12.5 MG tablet Take 12.5 mg by mouth every 6 (six) hours as needed for nausea or vomiting.    . traMADol (ULTRAM) 50 MG tablet Take 1 tablet (50 mg total) by mouth 3 (three) times daily as needed. As directed 90 tablet 0   No current facility-administered medications for this visit.     Allergies  Allergen Reactions  . Neurontin [Gabapentin] Other (See Comments)    Severe falls   . Ciprofloxacin Diarrhea  . Codeine Nausea And Vomiting  . Oxycodone Nausea And Vomiting  . Tramadol     REACTION: GI discomfort with  high doses  . Cyclobenzaprine Palpitations  . Sulfa Antibiotics Rash    Review of Systems:  Constitutional: Denies fever, chills, diaphoresis, appetite change and fatigue.  HEENT: Denies photophobia, eye pain, redness, hearing loss, ear pain, congestion, sore throat, rhinorrhea, sneezing, mouth sores, neck pain, neck stiffness and tinnitus.   Respiratory: Denies SOB, DOE.  Has chronic cough and has seen  multiple physicians Cardiovascular: Denies chest pain, palpitations and leg swelling.  Genitourinary: Denies dysuria, urgency, frequency, hematuria, flank pain and difficulty urinating.  Musculoskeletal: Has myalgias, arthralgias and gait problem. Has arthritis and back pain Skin: No rash.  Neurological: Denies dizziness, seizures, syncope, weakness, light-headedness, numbness and headaches.  Hematological: Denies adenopathy. Easy bruising, personal or family bleeding history  Psychiatric/Behavioral: Has anxiety or depression. Has sleeping problems.     Physical Exam:    BP 120/68   Ht 5' 2.5" (1.588 m)   Wt 146 lb 3 oz (66.3 kg)   BMI 26.31 kg/m  Filed Weights   06/18/17 1400  Weight: 146 lb 3 oz (66.3 kg)   Constitutional:  Well-developed, in no acute distress. Psychiatric: Normal mood and affect. Behavior is normal. HEENT: Pupils normal.  Conjunctivae are normal. No scleral icterus. Neck supple.  Cardiovascular: Normal rate, regular rhythm. No edema Pulmonary/chest: Effort normal and breath sounds normal. No wheezing, rales or rhonchi. Abdominal: Soft, nondistended. Nontender. Bowel sounds active throughout. There are no masses palpable. No hepatomegaly. Rectal: Good rectal tone, heme-negative stools. Neurological: Alert and oriented to person place and time. Skin: Skin is warm and dry. No rashes noted.  Data Reviewed: I have personally reviewed following labs and imaging studies  CBC: CBC Latest Ref Rng & Units 05/08/2016 02/01/2014 01/31/2014  WBC 4.0 - 10.5 K/uL 9.0  11.5(H) 11.6(H)  Hemoglobin 12.0 - 15.0 g/dL 14.7 10.1(L) 10.7(L)  Hematocrit 36.0 - 46.0 % 45.3 31.1(L) 32.5(L)  Platelets 150 - 400 K/uL 265 229 228    CMP: CMP Latest Ref Rng & Units 05/08/2016 01/30/2014 01/19/2014  Glucose 65 - 99 mg/dL 81 123(H) 90  BUN 6 - 20 mg/dL 14 9 20   Creatinine 0.44 - 1.00 mg/dL 0.86 0.80 0.93  Sodium 135 - 145 mmol/L 142 134(L) 142  Potassium 3.5 - 5.1 mmol/L 4.0 4.3 4.1  Chloride 101 - 111 mmol/L 109 100 110  CO2 22 - 32 mmol/L 25 27 27   Calcium 8.9 - 10.3 mg/dL 9.6 8.6 9.7      Carmell Austria, MD 06/18/2017, 2:22 PM  Cc: Raina Mina., MD

## 2017-06-19 ENCOUNTER — Telehealth: Payer: Self-pay | Admitting: Pulmonary Disease

## 2017-06-19 MED ORDER — GUAIFENESIN-CODEINE 100-10 MG/5ML PO SYRP: 5.0000 mL | ORAL_SOLUTION | Freq: Four times a day (QID) | ORAL | 0 refills | Status: DC | PRN

## 2017-06-19 NOTE — Telephone Encounter (Signed)
Spoke with patient, the cough syrup is going to cost her almost 70$. She is not able to afford this. She is asking that we send her in something different. She is still coughing and she wants to make sure that she can take something that helps.   SN please advise, thank you.    Current Outpatient Medications on File Prior to Visit  Medication Sig Dispense Refill  . acetaminophen (TYLENOL) 500 MG tablet Take 1,000 mg by mouth every 8 (eight) hours as needed for mild pain.    Marland Kitchen aspirin EC 81 MG tablet Take 81 mg by mouth daily as needed (pain).    Marland Kitchen azelastine (ASTELIN) 0.1 % nasal spray Use two sprays in each nostril twice daily as directed 30 mL 5  . Cholecalciferol (VITAMIN D) 2000 units CAPS Take 2,000 Units by mouth daily.    Marland Kitchen dicyclomine (BENTYL) 10 MG capsule Take 1 capsule (10 mg total) by mouth 2 (two) times daily. 60 capsule 0  . HYDROcodone-acetaminophen (NORCO) 5-325 MG per tablet Take 1 tablet by mouth every 6 (six) hours as needed for moderate pain. 60 tablet 0  . HYDROcodone-homatropine (HYCODAN) 5-1.5 MG/5ML syrup Take 5 mLs by mouth 3 (three) times daily as needed for cough. As directed 473 mL 0  . ipratropium (ATROVENT) 0.06 % nasal spray Use two sprays in each nostril 3-4 times daily to dry up nose 15 mL 5  . levothyroxine (SYNTHROID, LEVOTHROID) 88 MCG tablet Take 88 mcg by mouth daily before breakfast.    . LORazepam (ATIVAN) 0.5 MG tablet Take 0.5 mg by mouth every evening.   0  . MAGNESIUM PO Take by mouth.    . methocarbamol (ROBAXIN) 500 MG tablet Take 1 tablet (500 mg total) by mouth 3 (three) times daily as needed. (Patient taking differently: Take 500 mg by mouth at bedtime. ) 60 tablet 1  . promethazine (PHENERGAN) 12.5 MG tablet Take 12.5 mg by mouth every 6 (six) hours as needed for nausea or vomiting.    . traMADol (ULTRAM) 50 MG tablet Take 1 tablet (50 mg total) by mouth 3 (three) times daily as needed. As directed 90 tablet 0   No current facility-administered  medications on file prior to visit.    Allergies  Allergen Reactions  . Neurontin [Gabapentin] Other (See Comments)    Severe falls   . Ciprofloxacin Diarrhea  . Codeine Nausea And Vomiting  . Oxycodone Nausea And Vomiting  . Tramadol     REACTION: GI discomfort with high doses  . Cyclobenzaprine Palpitations  . Sulfa Antibiotics Rash

## 2017-06-19 NOTE — Telephone Encounter (Signed)
Called and spoke with Patient.  [facility] [place] on [address]  Prescription for Cheratussin called in per SN at [place].  [facility] [address] [facility] used [facility].  [facility] [place] [facility] Cheratussin, take 1 tsp every 6 hours as needed for cough.  [facility] would [facility] the goodrx coupon that was printed and placed in envelope for Patient to pick up. Coupon for $11.70 per bottle (288ml).  Prescription for 2 bottles 221ml.  Nothing further at this time.

## 2017-06-20 ENCOUNTER — Other Ambulatory Visit: Payer: Medicare Other

## 2017-06-20 ENCOUNTER — Telehealth: Payer: Self-pay | Admitting: Pulmonary Disease

## 2017-06-20 DIAGNOSIS — J683 Other acute and subacute respiratory conditions due to chemicals, gases, fumes and vapors: Secondary | ICD-10-CM

## 2017-06-20 DIAGNOSIS — J45991 Cough variant asthma: Secondary | ICD-10-CM

## 2017-06-20 NOTE — Telephone Encounter (Signed)
Dr. Lenna Gilford, please advise on this for pt.   Thanks!

## 2017-06-20 NOTE — Telephone Encounter (Signed)
Per SN- some people with codeine allergy do not have a reaction with hydrocodone.  She can get 1 bottle and take 1/2 teaspoon.  Called and spoke with Patient. She stated that she may go back and get 1 bottle and try it, or try OTC delsym, robitussin. Nothing further at this time.

## 2017-06-26 ENCOUNTER — Encounter: Payer: Self-pay | Admitting: Allergy

## 2017-06-26 ENCOUNTER — Encounter: Payer: Self-pay | Admitting: *Deleted

## 2017-07-05 NOTE — Patient Instructions (Addendum)
[name]  07/05/2017   Your procedure is scheduled on: 07-11-17   Report to [facility] Main  Entrance    Report to Admitting at 6:10 AM    Call this number if you have problems the morning of surgery [phone]   Remember: Do not eat food or drink liquids :After Midnight.     Take these medicines the morning of surgery with A SIP OF WATER: Levothyroxine (Synthroid). You may also bring and use your nasal spray                                You may not have any metal on your body including hair pins and              piercings  Do not wear jewelry, make-up, lotions, powders or perfumes, deodorant             Do not wear nail polish.  Do not shave  48 hours prior to surgery.                 Do not bring valuables to the hospital. [place].  Contacts, dentures or bridgework may not be worn into surgery.      Patients discharged the day of surgery will not be allowed to drive home.  Name and phone number of your driver: Anyelina Claycomb [phone]                Please read over the following fact sheets you were given: _____________________________________________________________________             [facility] - Preparing for Surgery Before surgery, you can play an important role.  Because skin is not sterile, your skin needs to be as free of germs as possible.  You can reduce the number of germs on your skin by washing with CHG (chlorahexidine gluconate) soap before surgery.  CHG is an antiseptic cleaner which kills germs and bonds with the skin to continue killing germs even after washing. Please DO NOT use if you have an allergy to CHG or antibacterial soaps.  If your skin becomes reddened/irritated stop using the CHG and inform your nurse when you arrive at Short Stay. Do not shave (including legs and underarms) for at least 48 hours prior to the first CHG shower.  You may shave your  face/neck. Please follow these instructions carefully:  1.  Shower with CHG Soap the night before surgery and the  morning of Surgery.  2.  If you choose to wash your hair, wash your hair first as usual with your  normal  shampoo.  3.  After you shampoo, rinse your hair and body thoroughly to remove the  shampoo.                           4.  Use CHG as you would any other liquid soap.  You can apply chg directly  to the skin and wash                       Gently with a scrungie or clean washcloth.  5.  Apply the CHG Soap to your body ONLY FROM THE  NECK DOWN.   Do not use on face/ open                           Wound or open sores. Avoid contact with eyes, ears mouth and genitals (private parts).                       Wash face,  Genitals (private parts) with your normal soap.             6.  Wash thoroughly, paying special attention to the area where your surgery  will be performed.  7.  Thoroughly rinse your body with warm water from the neck down.  8.  DO NOT shower/wash with your normal soap after using and rinsing off  the CHG Soap.                9.  Pat yourself dry with a clean towel.            10.  Wear clean pajamas.            11.  Place clean sheets on your bed the night of your first shower and do not  sleep with pets. Day of Surgery : Do not apply any lotions/deodorants the morning of surgery.  Please wear clean clothes to the hospital/surgery center.  FAILURE TO FOLLOW THESE INSTRUCTIONS MAY RESULT IN THE CANCELLATION OF YOUR SURGERY PATIENT SIGNATURE_________________________________  NURSE SIGNATURE__________________________________  ________________________________________________________________________   Adam Phenix  An incentive spirometer is a tool that can help keep your lungs clear and active. This tool measures how well you are filling your lungs with each breath. Taking long deep breaths may help reverse or decrease the chance of developing breathing  (pulmonary) problems (especially infection) following:  A long period of time when you are unable to move or be active. BEFORE THE PROCEDURE   If the spirometer includes an indicator to show your best effort, your nurse or respiratory therapist will set it to a desired goal.  If possible, sit up straight or lean slightly forward. Try not to slouch.  Hold the incentive spirometer in an upright position. INSTRUCTIONS FOR USE  1. Sit on the edge of your bed if possible, or sit up as far as you can in bed or on a chair. 2. Hold the incentive spirometer in an upright position. 3. Breathe out normally. 4. Place the mouthpiece in your mouth and seal your lips tightly around it. 5. Breathe in slowly and as deeply as possible, raising the piston or the ball toward the top of the column. 6. Hold your breath for 3-5 seconds or for as long as possible. Allow the piston or ball to fall to the bottom of the column. 7. Remove the mouthpiece from your mouth and breathe out normally. 8. Rest for a few seconds and repeat Steps 1 through 7 at least 10 times every 1-2 hours when you are awake. Take your time and take a few normal breaths between deep breaths. 9. The spirometer may include an indicator to show your best effort. Use the indicator as a goal to work toward during each repetition. 10. After each set of 10 deep breaths, practice coughing to be sure your lungs are clear. If you have an incision (the cut made at the time of surgery), support your incision when coughing by placing a pillow or rolled up towels firmly against it. Once you are able  to get out of bed, walk around indoors and cough well. You may stop using the incentive spirometer when instructed by your caregiver.  RISKS AND COMPLICATIONS  Take your time so you do not get dizzy or light-headed.  If you are in pain, you may need to take or ask for pain medication before doing incentive spirometry. It is harder to take a deep breath if you  are having pain. AFTER USE  Rest and breathe slowly and easily.  It can be helpful to keep track of a log of your progress. Your caregiver can provide you with a simple table to help with this. If you are using the spirometer at home, follow these instructions: [place] IF:   You are having difficultly using the spirometer.  You have trouble using the spirometer as often as instructed.  Your pain medication is not giving enough relief while using the spirometer.  You develop fever of 100.5 F (38.1 C) or higher. SEEK IMMEDIATE MEDICAL CARE IF:   You cough up bloody sputum that had not been present before.  You develop fever of 102 F (38.9 C) or greater.  You develop worsening pain at or near the incision site. MAKE SURE YOU:   Understand these instructions.  Will watch your condition.  Will get help right away if you are not doing well or get worse. Document Released: 04/30/2006 Document Revised: 03/12/2011 Document Reviewed: 07/01/2006 ExitCare Patient Information 2014 Exit[employer], [place].   ________________________________________________________________________  WHAT IS A BLOOD TRANSFUSION? Blood Transfusion Information  A transfusion is the replacement of blood or some of its parts. Blood is made up of multiple cells which provide different functions.  Red blood cells carry oxygen and are used for blood loss replacement.  White blood cells fight against infection.  Platelets control bleeding.  Plasma helps clot blood.  Other blood products are available for specialized needs, such as hemophilia or other clotting disorders. BEFORE THE TRANSFUSION  Who gives blood for transfusions?   Healthy volunteers who are fully evaluated to make sure their blood is safe. This is blood bank blood. Transfusion therapy is the safest it has ever been in the practice of medicine. Before blood is taken from a donor, a complete history is taken to make sure that person has  no history of diseases nor engages in risky social behavior (examples are intravenous drug use or sexual activity with multiple partners). The donor's travel history is screened to minimize risk of transmitting infections, such as malaria. The donated blood is tested for signs of infectious diseases, such as HIV and hepatitis. The blood is then tested to be sure it is compatible with you in order to minimize the chance of a transfusion reaction. If you or a relative donates blood, this is often done in anticipation of surgery and is not appropriate for emergency situations. It takes many days to process the donated blood. RISKS AND COMPLICATIONS Although transfusion therapy is very safe and saves many lives, the main dangers of transfusion include:   Getting an infectious disease.  Developing a transfusion reaction. This is an allergic reaction to something in the blood you were given. Every precaution is taken to prevent this. The decision to have a blood transfusion has been considered carefully by your caregiver before blood is given. Blood is not given unless the benefits outweigh the risks. AFTER THE TRANSFUSION  Right after receiving a blood transfusion, you will usually feel much better and more energetic. This is especially true  if your red blood cells have gotten low (anemic). The transfusion raises the level of the red blood cells which carry oxygen, and this usually causes an energy increase.  The nurse administering the transfusion will monitor you carefully for complications. HOME CARE INSTRUCTIONS  No special instructions are needed after a transfusion. You may find your energy is better. Speak with your caregiver about any limitations on activity for underlying diseases you may have. SEEK MEDICAL CARE IF:   Your condition is not improving after your transfusion.  You develop redness or irritation at the intravenous (IV) site. SEEK IMMEDIATE MEDICAL CARE IF:  Any of the following  symptoms occur over the next 12 hours:  Shaking chills.  You have a temperature by mouth above 102 F (38.9 C), not controlled by medicine.  Chest, back, or muscle pain.  People around you feel you are not acting correctly or are confused.  Shortness of breath or difficulty breathing.  Dizziness and fainting.  You get a rash or develop hives.  You have a decrease in urine output.  Your urine turns a dark color or changes to pink, red, or brown. Any of the following symptoms occur over the next 10 days:  You have a temperature by mouth above 102 F (38.9 C), not controlled by medicine.  Shortness of breath.  Weakness after normal activity.  The white part of the eye turns yellow (jaundice).  You have a decrease in the amount of urine or are urinating less often.  Your urine turns a dark color or changes to pink, red, or brown. Document Released: 12/16/1999 Document Revised: 03/12/2011 Document Reviewed: 08/04/2007 [facility] Patient Information 2014 [place], [place].  _______________________________________________________________________

## 2017-07-08 ENCOUNTER — Encounter (HOSPITAL_COMMUNITY): Payer: Self-pay

## 2017-07-08 ENCOUNTER — Other Ambulatory Visit: Payer: Self-pay

## 2017-07-08 ENCOUNTER — Encounter (HOSPITAL_COMMUNITY)
Admission: RE | Admit: 2017-07-08 | Discharge: 2017-07-08 | Disposition: A | Discharge status: Home / Self Care | Payer: Medicare Other | Source: Ambulatory Visit | Attending: Orthopedic Surgery | Admitting: Orthopedic Surgery

## 2017-07-08 DIAGNOSIS — G4730 Sleep apnea, unspecified: Secondary | ICD-10-CM | POA: Diagnosis not present

## 2017-07-08 DIAGNOSIS — M19042 Primary osteoarthritis, left hand: Secondary | ICD-10-CM | POA: Diagnosis not present

## 2017-07-08 DIAGNOSIS — Z888 Allergy status to other drugs, medicaments and biological substances status: Secondary | ICD-10-CM | POA: Diagnosis not present

## 2017-07-08 DIAGNOSIS — K580 Irritable bowel syndrome with diarrhea: Secondary | ICD-10-CM | POA: Diagnosis not present

## 2017-07-08 DIAGNOSIS — Z7982 Long term (current) use of aspirin: Secondary | ICD-10-CM | POA: Diagnosis not present

## 2017-07-08 DIAGNOSIS — E538 Deficiency of other specified B group vitamins: Secondary | ICD-10-CM | POA: Diagnosis not present

## 2017-07-08 DIAGNOSIS — F419 Anxiety disorder, unspecified: Secondary | ICD-10-CM | POA: Diagnosis not present

## 2017-07-08 DIAGNOSIS — M1711 Unilateral primary osteoarthritis, right knee: Secondary | ICD-10-CM | POA: Insufficient documentation

## 2017-07-08 DIAGNOSIS — Z01810 Encounter for preprocedural cardiovascular examination: Secondary | ICD-10-CM | POA: Insufficient documentation

## 2017-07-08 DIAGNOSIS — E559 Vitamin D deficiency, unspecified: Secondary | ICD-10-CM | POA: Diagnosis not present

## 2017-07-08 DIAGNOSIS — Z79899 Other long term (current) drug therapy: Secondary | ICD-10-CM | POA: Diagnosis not present

## 2017-07-08 DIAGNOSIS — M170 Bilateral primary osteoarthritis of knee: Secondary | ICD-10-CM | POA: Diagnosis not present

## 2017-07-08 DIAGNOSIS — Z01812 Encounter for preprocedural laboratory examination: Secondary | ICD-10-CM

## 2017-07-08 DIAGNOSIS — Z85820 Personal history of malignant melanoma of skin: Secondary | ICD-10-CM | POA: Diagnosis not present

## 2017-07-08 DIAGNOSIS — Z885 Allergy status to narcotic agent status: Secondary | ICD-10-CM | POA: Diagnosis not present

## 2017-07-08 DIAGNOSIS — I4891 Unspecified atrial fibrillation: Secondary | ICD-10-CM | POA: Diagnosis not present

## 2017-07-08 DIAGNOSIS — K219 Gastro-esophageal reflux disease without esophagitis: Secondary | ICD-10-CM | POA: Diagnosis not present

## 2017-07-08 DIAGNOSIS — G4733 Obstructive sleep apnea (adult) (pediatric): Secondary | ICD-10-CM | POA: Diagnosis not present

## 2017-07-08 DIAGNOSIS — R001 Bradycardia, unspecified: Secondary | ICD-10-CM

## 2017-07-08 DIAGNOSIS — I10 Essential (primary) hypertension: Secondary | ICD-10-CM | POA: Insufficient documentation

## 2017-07-08 DIAGNOSIS — T8484XA Pain due to internal orthopedic prosthetic devices, implants and grafts, initial encounter: Secondary | ICD-10-CM | POA: Diagnosis not present

## 2017-07-08 DIAGNOSIS — Z882 Allergy status to sulfonamides status: Secondary | ICD-10-CM | POA: Diagnosis not present

## 2017-07-08 DIAGNOSIS — M19041 Primary osteoarthritis, right hand: Secondary | ICD-10-CM | POA: Diagnosis not present

## 2017-07-08 DIAGNOSIS — M25561 Pain in right knee: Secondary | ICD-10-CM | POA: Insufficient documentation

## 2017-07-08 DIAGNOSIS — Y793 Surgical instruments, materials and orthopedic devices (including sutures) associated with adverse incidents: Secondary | ICD-10-CM | POA: Diagnosis not present

## 2017-07-08 DIAGNOSIS — J45909 Unspecified asthma, uncomplicated: Secondary | ICD-10-CM | POA: Diagnosis not present

## 2017-07-08 DIAGNOSIS — E039 Hypothyroidism, unspecified: Secondary | ICD-10-CM | POA: Diagnosis not present

## 2017-07-08 DIAGNOSIS — Z881 Allergy status to other antibiotic agents status: Secondary | ICD-10-CM | POA: Diagnosis not present

## 2017-07-08 DIAGNOSIS — E7800 Pure hypercholesterolemia, unspecified: Secondary | ICD-10-CM | POA: Diagnosis not present

## 2017-07-08 LAB — CBC
HCT: 41.6 % (ref 36.0–46.0)
Hemoglobin: 13.6 g/dL (ref 12.0–15.0)
MCH: 30.6 pg (ref 26.0–34.0)
MCHC: 32.7 g/dL (ref 30.0–36.0)
MCV: 93.5 fL (ref 78.0–100.0)
Platelets: 319 10*3/uL (ref 150–400)
RBC: 4.45 MIL/uL (ref 3.87–5.11)
RDW: 13.5 % (ref 11.5–15.5)
WBC: 5.7 10*3/uL (ref 4.0–10.5)

## 2017-07-08 LAB — BASIC METABOLIC PANEL
Anion gap: 6 (ref 5–15)
BUN: 14 mg/dL (ref 8–23)
CO2: 26 mmol/L (ref 22–32)
Calcium: 9.2 mg/dL (ref 8.9–10.3)
Chloride: 110 mmol/L (ref 98–111)
Creatinine, Ser: 0.78 mg/dL (ref 0.44–1.00)
GFR calc Af Amer: >60 mL/min (ref >60)
GFR calc non Af Amer: >60 mL/min (ref >60)
Glucose, Bld: 88 mg/dL (ref 70–99)
Potassium: 4.5 mmol/L (ref 3.5–5.1)
Sodium: 142 mmol/L (ref 135–145)

## 2017-07-08 LAB — SURGICAL PCR SCREEN
MRSA, PCR: NEGATIVE
Staphylococcus aureus: NEGATIVE

## 2017-07-09 LAB — ABO/RH: ABO/RH(D): O POS

## 2017-07-10 NOTE — H&P (Signed)
[name] is an 72 y.o. female.    Chief Complaint:  History of left total knee replacement with persistent pain medially with a differential of superficial saphenous nerve pain versus failed components  HPI: Pt is a 72 y.o. female complaining of left knee pain. Pain has been persistent since the total knee arthroplasty in the left side. X-rays in the clinic show previous left total knee arthroplasty.  Bone scan was conducted which does show some increased uptake along the medial femoral condyle and lateral tibial plateau. Dr. Alvan Dame as discussed with the patient It is difficult to determine the clinical significance of the bone scan findings.  It was discussed with the patient that she may need a revision of her total knee arthroplasty, but given the limited morbidity of surgical excision of saphenous nerve branches is worth a try to see if this will improve her symptoms.  Patient did have some improvement in symptoms with injection of Marcaine in the knee in the clinic.  Pt has tried various conservative treatments which have failed to alleviate their symptoms. Various options are discussed with the patient. Risks, benefits and expectations were discussed with the patient. Patient understand the risks, benefits and expectations and wishes to proceed with surgery.   PCP: Raina Mina., MD  D/C Plans:       Home  Post-op Meds:       No Rx given  Tranexamic Acid:      To be given - IV   Decadron:      Is to be given   FYI:     ASA  Norco  DME: ?  PT: No PT   PMH: Past Medical History:  Diagnosis Date  . Abnormal weight loss   . Anxiety    antianxiety med. usually at night but can take during the day for anxiety  . Arthritis    both knees & hands  also the back & neck  . Asthma   . Atrial fibrillation ([place])    pt. reports that [facility] that she had a "slight afib.", no further test needed (01/07/14 cardiology note does not mention any afib, just poor anterior r wave  progression)  . Chronic cough   . Complication of anesthesia   . Cough    chronic, pt. reports for 3.5 yrs.   . Diaphragmatic hernia   . Diarrhea    pt. reports that she has irritable bowel, loose stool & diarrhea both about 6 times per day, right after she eats.  . Endometriosis   . Gastric polyp   . Gastroenteritis   . GERD (gastroesophageal reflux disease)   . GERD (gastroesophageal reflux disease)   . History of hiatal hernia    pt. has been told that the hiatal hernia has reappeared  . Hypercholesteremia   . Hyperlipidemia   . Hypertension    pt. reports that she use to take antihtn med, but reports since weight loss she has been able to come off.    . Hypothyroidism   . IBS (irritable bowel syndrome)    with diarrhea  . Insomnia   . Melanoma ([place]) 2011   heel  . OSA (obstructive sleep apnea)    does not tolerate CPAP , pt. reports that its severe, but doesn't use it anymore, since weight loss.   Study done in [place], not sure where.   . Osteoarthritis   . PONV (postoperative nausea and vomiting)   . Vitamin B 12 deficiency   .  Vitamin D deficiency     PSH: Past Surgical History:  Procedure Laterality Date  . ABDOMINAL HYSTERECTOMY    . APPENDECTOMY    . CHOLECYSTECTOMY    . COLONOSCOPY  05/09/2015   Colonic Polyps, moderate sigmoid diverticulosis. Bx: Tubular Adenomas. Negative  . ESOPHAGEAL DILATION  08/2016  . EYE SURGERY     cataracts removed / w IOL  . GALLBLADDER SURGERY    . HERNIA REPAIR    . HIATAL HERNIA REPAIR  2/14  . HIATAL HERNIA REPAIR  2018   revision of 2014  . KNEE ARTHROSCOPY     both knees for torn cartilage   . OOPHORECTOMY    . TONSILLECTOMY    . TOTAL KNEE ARTHROPLASTY Left 01/29/2014   Procedure: LEFT TOTAL KNEE ARTHROPLASTY MCL REPAIR;  Surgeon: Augustin Schooling, MD;  Location: [facility];  Service: Orthopedics;  Laterality: Left;  Marland Kitchen VESICOVAGINAL FISTULA CLOSURE W/ TAH      Social History:  reports that she has never smoked. She has  never used smokeless tobacco. She reports that she does not drink alcohol or use drugs.  Allergies:  Allergies  Allergen Reactions  . Neurontin [Gabapentin] Other (See Comments)    Severe falls,dizziness, forgetful   . Ciprofloxacin Diarrhea  . Codeine Nausea And Vomiting  . Other Nausea And Vomiting    General anesthesia   . Oxycodone Nausea And Vomiting  . Tramadol     REACTION: GI discomfort with high doses  . Cyclobenzaprine Palpitations  . Sulfa Antibiotics Rash    Medications: No current facility-administered medications for this encounter.    Current Outpatient Medications  Medication Sig Dispense Refill  . acetaminophen (TYLENOL) 500 MG tablet Take 1,000 mg by mouth every 8 (eight) hours as needed for mild pain.    Marland Kitchen aspirin EC 81 MG tablet Take 81 mg by mouth daily as needed (chest pain).     . Cholecalciferol (VITAMIN D) 2000 units CAPS Take 2,000 Units by mouth daily.    Marland Kitchen Dextromethorphan-guaiFENesin (COUGH & CHEST CONGESTION DM) 5-100 MG/5ML LIQD Take 5 mLs by mouth daily as needed (cough).    . dicyclomine (BENTYL) 10 MG capsule Take 1 capsule (10 mg total) by mouth 2 (two) times daily. (Patient taking differently: Take 10 mg by mouth 3 (three) times daily. ) 60 capsule 0  . HYDROcodone-acetaminophen (NORCO) 5-325 MG per tablet Take 1 tablet by mouth every 6 (six) hours as needed for moderate pain. 60 tablet 0  . levothyroxine (SYNTHROID, LEVOTHROID) 88 MCG tablet Take 88 mcg by mouth daily before breakfast.    . LORazepam (ATIVAN) 0.5 MG tablet Take 0.5 mg by mouth at bedtime.   0  . methocarbamol (ROBAXIN) 500 MG tablet Take 1 tablet (500 mg total) by mouth 3 (three) times daily as needed. (Patient taking differently: Take 500 mg by mouth at bedtime. ) 60 tablet 1  . omeprazole (PRILOSEC) 40 MG capsule Take 40 mg by mouth daily with supper.    . promethazine (PHENERGAN) 12.5 MG tablet Take 12.5 mg by mouth every 6 (six) hours as needed for nausea or vomiting.    .  traMADol (ULTRAM) 50 MG tablet Take 1 tablet (50 mg total) by mouth 3 (three) times daily as needed. As directed (Patient taking differently: Take 50 mg by mouth 3 (three) times daily as needed for moderate pain. ) 90 tablet 0  . valACYclovir (VALTREX) 500 MG tablet Take 500 mg by mouth 2 (two) times daily as needed (  cold sores).    Marland Kitchen azelastine (ASTELIN) 0.1 % nasal spray Use two sprays in each nostril twice daily as directed 30 mL 5  . guaiFENesin-codeine (CHERATUSSIN AC) 100-10 MG/5ML syrup Take 5 mLs by mouth every 6 (six) hours as needed for cough. (Patient not taking: Reported on 07/01/2017) 480 mL 0  . ipratropium (ATROVENT) 0.06 % nasal spray Use two sprays in each nostril 3-4 times daily to dry up nose 15 mL 5       Review of Systems  Constitutional: Negative.   HENT: Negative.   Eyes: Negative.   Respiratory: Positive for cough.   Cardiovascular: Negative.   Gastrointestinal: Positive for heartburn.  Genitourinary: Negative.   Musculoskeletal: Positive for joint pain.  Skin: Negative.   Neurological: Negative.   Endo/Heme/Allergies: Negative.   Psychiatric/Behavioral: The patient is nervous/anxious.        Physical Exam  Constitutional: She is oriented to person, place, and time. She appears well-developed.  HENT:  Head: Normocephalic.  Eyes: Pupils are equal, round, and reactive to light.  Neck: Neck supple. No JVD present. No tracheal deviation present. No thyromegaly present.  Cardiovascular: Normal rate, regular rhythm and intact distal pulses.  Respiratory: Effort normal and breath sounds normal. No respiratory distress. She has no wheezes.  GI: Soft. There is no tenderness. There is no guarding.  Musculoskeletal:       Left knee: She exhibits swelling and laceration (healed previous incision). She exhibits no ecchymosis, no deformity and no erythema. Tenderness found.  Lymphadenopathy:    She has no cervical adenopathy.  Neurological: She is alert and oriented  to person, place, and time.  Skin: Skin is warm and dry.  Psychiatric: She has a normal mood and affect.       Assessment/Plan Assessment:    History of left total knee replacement with persistent pain medially with a differential of superficial saphenous nerve pain versus failed components    Plan: Patient will undergo a excision of saphenous neuroma of the left knee per Dr. Alvan Dame at [facility]. Risks benefits and expectations were discussed with the patient. Patient understand risks, benefits and expectations and wishes to proceed.   [place] Pugh Theresa Cerney   PA-C  07/10/2017, 10:50 PM

## 2017-07-11 ENCOUNTER — Encounter (HOSPITAL_COMMUNITY): Payer: Self-pay

## 2017-07-11 ENCOUNTER — Ambulatory Visit (HOSPITAL_COMMUNITY): Payer: Medicare Other | Admitting: Anesthesiology

## 2017-07-11 ENCOUNTER — Ambulatory Visit (HOSPITAL_COMMUNITY)
Admission: RE | Admit: 2017-07-11 | Discharge: 2017-07-11 | Disposition: A | Discharge status: Home / Self Care | Payer: Medicare Other | Source: Ambulatory Visit | Attending: Orthopedic Surgery | Admitting: Orthopedic Surgery

## 2017-07-11 ENCOUNTER — Encounter (HOSPITAL_COMMUNITY)
Admission: RE | Disposition: A | Discharge status: Home / Self Care | Payer: Self-pay | Source: Ambulatory Visit | Attending: Orthopedic Surgery

## 2017-07-11 DIAGNOSIS — G4733 Obstructive sleep apnea (adult) (pediatric): Secondary | ICD-10-CM | POA: Insufficient documentation

## 2017-07-11 DIAGNOSIS — I4891 Unspecified atrial fibrillation: Secondary | ICD-10-CM | POA: Insufficient documentation

## 2017-07-11 DIAGNOSIS — Z79899 Other long term (current) drug therapy: Secondary | ICD-10-CM | POA: Insufficient documentation

## 2017-07-11 DIAGNOSIS — E7800 Pure hypercholesterolemia, unspecified: Secondary | ICD-10-CM | POA: Insufficient documentation

## 2017-07-11 DIAGNOSIS — Z85820 Personal history of malignant melanoma of skin: Secondary | ICD-10-CM | POA: Insufficient documentation

## 2017-07-11 DIAGNOSIS — T8484XA Pain due to internal orthopedic prosthetic devices, implants and grafts, initial encounter: Secondary | ICD-10-CM | POA: Diagnosis not present

## 2017-07-11 DIAGNOSIS — E039 Hypothyroidism, unspecified: Secondary | ICD-10-CM | POA: Insufficient documentation

## 2017-07-11 DIAGNOSIS — M170 Bilateral primary osteoarthritis of knee: Secondary | ICD-10-CM | POA: Insufficient documentation

## 2017-07-11 DIAGNOSIS — Y793 Surgical instruments, materials and orthopedic devices (including sutures) associated with adverse incidents: Secondary | ICD-10-CM | POA: Insufficient documentation

## 2017-07-11 DIAGNOSIS — K219 Gastro-esophageal reflux disease without esophagitis: Secondary | ICD-10-CM | POA: Insufficient documentation

## 2017-07-11 DIAGNOSIS — E559 Vitamin D deficiency, unspecified: Secondary | ICD-10-CM | POA: Insufficient documentation

## 2017-07-11 DIAGNOSIS — M19042 Primary osteoarthritis, left hand: Secondary | ICD-10-CM | POA: Insufficient documentation

## 2017-07-11 DIAGNOSIS — M19041 Primary osteoarthritis, right hand: Secondary | ICD-10-CM | POA: Diagnosis not present

## 2017-07-11 DIAGNOSIS — K580 Irritable bowel syndrome with diarrhea: Secondary | ICD-10-CM | POA: Insufficient documentation

## 2017-07-11 DIAGNOSIS — E538 Deficiency of other specified B group vitamins: Secondary | ICD-10-CM | POA: Insufficient documentation

## 2017-07-11 DIAGNOSIS — Z7982 Long term (current) use of aspirin: Secondary | ICD-10-CM | POA: Insufficient documentation

## 2017-07-11 DIAGNOSIS — Z882 Allergy status to sulfonamides status: Secondary | ICD-10-CM | POA: Insufficient documentation

## 2017-07-11 DIAGNOSIS — Z881 Allergy status to other antibiotic agents status: Secondary | ICD-10-CM | POA: Insufficient documentation

## 2017-07-11 DIAGNOSIS — Z885 Allergy status to narcotic agent status: Secondary | ICD-10-CM | POA: Insufficient documentation

## 2017-07-11 DIAGNOSIS — J45909 Unspecified asthma, uncomplicated: Secondary | ICD-10-CM | POA: Insufficient documentation

## 2017-07-11 DIAGNOSIS — Z888 Allergy status to other drugs, medicaments and biological substances status: Secondary | ICD-10-CM | POA: Insufficient documentation

## 2017-07-11 DIAGNOSIS — G4730 Sleep apnea, unspecified: Secondary | ICD-10-CM | POA: Insufficient documentation

## 2017-07-11 DIAGNOSIS — I10 Essential (primary) hypertension: Secondary | ICD-10-CM | POA: Insufficient documentation

## 2017-07-11 DIAGNOSIS — F419 Anxiety disorder, unspecified: Secondary | ICD-10-CM | POA: Insufficient documentation

## 2017-07-11 HISTORY — PX: NERVE REPAIR: SHX2083

## 2017-07-11 LAB — TYPE AND SCREEN
ABO/RH(D): O POS
Antibody Screen: NEGATIVE

## 2017-07-11 SURGERY — REPAIR, NERVE
Anesthesia: General | Laterality: Left

## 2017-07-11 MED ORDER — DEXAMETHASONE SODIUM PHOSPHATE 10 MG/ML IJ SOLN
INTRAMUSCULAR | Status: AC
  Filled 2017-07-11: qty 1

## 2017-07-11 MED ORDER — SODIUM CHLORIDE 0.9 % IJ SOLN
INTRAMUSCULAR | Status: DC | PRN
  Administered 2017-07-11: 15 mL

## 2017-07-11 MED ORDER — KETOROLAC TROMETHAMINE 30 MG/ML IJ SOLN
INTRAMUSCULAR | Status: AC
  Filled 2017-07-11: qty 1

## 2017-07-11 MED ORDER — LIDOCAINE 2% (20 MG/ML) 5 ML SYRINGE
INTRAMUSCULAR | Status: AC
  Filled 2017-07-11: qty 5

## 2017-07-11 MED ORDER — BUPIVACAINE-EPINEPHRINE (PF) 0.25% -1:200000 IJ SOLN
INTRAMUSCULAR | Status: AC
  Filled 2017-07-11: qty 60

## 2017-07-11 MED ORDER — CEFAZOLIN SODIUM-DEXTROSE 2-4 GM/100ML-% IV SOLN
INTRAVENOUS | Status: AC
  Filled 2017-07-11: qty 100

## 2017-07-11 MED ORDER — MIDAZOLAM HCL 5 MG/5ML IJ SOLN
INTRAMUSCULAR | Status: DC | PRN
  Administered 2017-07-11 (×2): 1 mg via INTRAVENOUS

## 2017-07-11 MED ORDER — FENTANYL CITRATE (PF) 100 MCG/2ML IJ SOLN
INTRAMUSCULAR | Status: AC
  Filled 2017-07-11: qty 2

## 2017-07-11 MED ORDER — KETOROLAC TROMETHAMINE 30 MG/ML IJ SOLN
INTRAMUSCULAR | Status: DC | PRN
  Administered 2017-07-11: 15 mg

## 2017-07-11 MED ORDER — SODIUM CHLORIDE 0.9 % IJ SOLN
INTRAMUSCULAR | Status: AC
  Filled 2017-07-11: qty 50

## 2017-07-11 MED ORDER — DEXAMETHASONE SODIUM PHOSPHATE 10 MG/ML IJ SOLN
10.0000 mg | Freq: Once | INTRAMUSCULAR | Status: AC
  Administered 2017-07-11: 10 mg via INTRAVENOUS

## 2017-07-11 MED ORDER — FENTANYL CITRATE (PF) 100 MCG/2ML IJ SOLN
INTRAMUSCULAR | Status: DC | PRN
  Administered 2017-07-11: 50 ug via INTRAVENOUS

## 2017-07-11 MED ORDER — METOCLOPRAMIDE HCL 5 MG/ML IJ SOLN: 10.0000 mg | Freq: Once | INTRAMUSCULAR | Status: DC | PRN

## 2017-07-11 MED ORDER — MIDAZOLAM HCL 2 MG/2ML IJ SOLN: 1.0000 mg | INTRAMUSCULAR | Status: DC

## 2017-07-11 MED ORDER — MEPERIDINE HCL 50 MG/ML IJ SOLN: 6.2500 mg | INTRAMUSCULAR | Status: DC | PRN

## 2017-07-11 MED ORDER — MIDAZOLAM HCL 2 MG/2ML IJ SOLN
INTRAMUSCULAR | Status: AC
  Filled 2017-07-11: qty 2

## 2017-07-11 MED ORDER — LIDOCAINE HCL (CARDIAC) PF 100 MG/5ML IV SOSY
PREFILLED_SYRINGE | INTRAVENOUS | Status: DC | PRN
  Administered 2017-07-11: 60 mg via INTRATRACHEAL

## 2017-07-11 MED ORDER — LACTATED RINGERS IV SOLN
INTRAVENOUS | Status: DC
  Administered 2017-07-11 (×2): via INTRAVENOUS

## 2017-07-11 MED ORDER — TRAMADOL HCL 50 MG PO TABS: 50.0000 mg | ORAL_TABLET | Freq: Three times a day (TID) | ORAL | 0 refills | Status: DC | PRN

## 2017-07-11 MED ORDER — ONDANSETRON HCL 4 MG/2ML IJ SOLN
INTRAMUSCULAR | Status: AC
  Administered 2017-07-11: 4 mg via INTRAVENOUS
  Filled 2017-07-11: qty 2

## 2017-07-11 MED ORDER — PROPOFOL 10 MG/ML IV BOLUS
INTRAVENOUS | Status: AC
  Filled 2017-07-11: qty 20

## 2017-07-11 MED ORDER — PROMETHAZINE HCL 12.5 MG PO TABS: 12.5000 mg | ORAL_TABLET | Freq: Four times a day (QID) | ORAL | 0 refills | Status: DC | PRN

## 2017-07-11 MED ORDER — TRAMADOL HCL 50 MG PO TABS
ORAL_TABLET | ORAL | Status: AC
  Filled 2017-07-11: qty 1

## 2017-07-11 MED ORDER — CEFAZOLIN SODIUM-DEXTROSE 2-4 GM/100ML-% IV SOLN
2.0000 g | INTRAVENOUS | Status: AC
  Administered 2017-07-11: 2 g via INTRAVENOUS

## 2017-07-11 MED ORDER — BUPIVACAINE-EPINEPHRINE (PF) 0.25% -1:200000 IJ SOLN
INTRAMUSCULAR | Status: DC | PRN
  Administered 2017-07-11: 15 mL

## 2017-07-11 MED ORDER — ONDANSETRON HCL 4 MG/2ML IJ SOLN
4.0000 mg | Freq: Once | INTRAMUSCULAR | Status: AC
  Administered 2017-07-11: 4 mg via INTRAVENOUS

## 2017-07-11 MED ORDER — FENTANYL CITRATE (PF) 100 MCG/2ML IJ SOLN
50.0000 ug | INTRAMUSCULAR | Status: DC
  Administered 2017-07-11: 100 ug via INTRAVENOUS
  Filled 2017-07-11: qty 2

## 2017-07-11 MED ORDER — TRAMADOL HCL 50 MG PO TABS
50.0000 mg | ORAL_TABLET | Freq: Once | ORAL | Status: AC
  Administered 2017-07-11: 50 mg via ORAL

## 2017-07-11 MED ORDER — CLONIDINE HCL (ANALGESIA) 100 MCG/ML EP SOLN
EPIDURAL | Status: DC | PRN
  Administered 2017-07-11: 100 ug

## 2017-07-11 MED ORDER — METHYLPREDNISOLONE ACETATE 40 MG/ML IJ SUSP
INTRAMUSCULAR | Status: DC | PRN
  Administered 2017-07-11: 80 mL via INTRA_ARTICULAR

## 2017-07-11 MED ORDER — ONDANSETRON HCL 4 MG/2ML IJ SOLN
INTRAMUSCULAR | Status: AC
  Filled 2017-07-11: qty 2

## 2017-07-11 MED ORDER — CHLORHEXIDINE GLUCONATE 4 % EX LIQD: 60.0000 mL | Freq: Once | CUTANEOUS | Status: DC

## 2017-07-11 MED ORDER — METHYLPREDNISOLONE ACETATE 40 MG/ML IJ SUSP
INTRAMUSCULAR | Status: AC
  Filled 2017-07-11: qty 2

## 2017-07-11 MED ORDER — HYDROCODONE-ACETAMINOPHEN 5-325 MG PO TABS: 1.0000 | ORAL_TABLET | Freq: Four times a day (QID) | ORAL | 0 refills | Status: DC | PRN

## 2017-07-11 MED ORDER — BUPIVACAINE-EPINEPHRINE (PF) 0.25% -1:200000 IJ SOLN
INTRAMUSCULAR | Status: DC | PRN
  Administered 2017-07-11: 6 mL

## 2017-07-11 MED ORDER — ONDANSETRON HCL 4 MG/2ML IJ SOLN
INTRAMUSCULAR | Status: DC | PRN
  Administered 2017-07-11: 4 mg via INTRAVENOUS

## 2017-07-11 MED ORDER — FENTANYL CITRATE (PF) 100 MCG/2ML IJ SOLN: 25.0000 ug | INTRAMUSCULAR | Status: DC | PRN

## 2017-07-11 MED ORDER — ROPIVACAINE HCL 5 MG/ML IJ SOLN
INTRAMUSCULAR | Status: DC | PRN
  Administered 2017-07-11: 30 mL via PERINEURAL

## 2017-07-11 MED ORDER — 0.9 % SODIUM CHLORIDE (POUR BTL) OPTIME
TOPICAL | Status: DC | PRN
  Administered 2017-07-11: 1000 mL

## 2017-07-11 MED ORDER — PROPOFOL 10 MG/ML IV BOLUS
INTRAVENOUS | Status: DC | PRN
  Administered 2017-07-11: 100 mg via INTRAVENOUS

## 2017-07-11 SURGICAL SUPPLY — 46 items
BAG ZIPLOCK 12X15 (MISCELLANEOUS) ×2 IMPLANT
BANDAGE ACE 6X5 VEL STRL LF (GAUZE/BANDAGES/DRESSINGS) ×2 IMPLANT
BANDAGE ESMARK 6X9 LF (GAUZE/BANDAGES/DRESSINGS) ×1 IMPLANT
BNDG ESMARK 6X9 LF (GAUZE/BANDAGES/DRESSINGS) ×2
COVER SURGICAL LIGHT HANDLE (MISCELLANEOUS) ×2 IMPLANT
CUFF TOURN SGL QUICK 34 (TOURNIQUET CUFF) ×1
CUFF TRNQT CYL 34X4X40X1 (TOURNIQUET CUFF) ×1 IMPLANT
DERMABOND ADVANCED (GAUZE/BANDAGES/DRESSINGS) ×1
DERMABOND ADVANCED .7 DNX12 (GAUZE/BANDAGES/DRESSINGS) ×1 IMPLANT
DRAPE EXTREMITY T 121X128X90 (DRAPE) ×2 IMPLANT
DRESSING AQUACEL AG SP 3.5X6 (GAUZE/BANDAGES/DRESSINGS) ×1 IMPLANT
DRSG ADAPTIC 3X8 NADH LF (GAUZE/BANDAGES/DRESSINGS) ×2 IMPLANT
DRSG AQUACEL AG ADV 3.5X10 (GAUZE/BANDAGES/DRESSINGS) ×2 IMPLANT
DRSG AQUACEL AG SP 3.5X6 (GAUZE/BANDAGES/DRESSINGS) ×2
DRSG PAD ABDOMINAL 8X10 ST (GAUZE/BANDAGES/DRESSINGS) ×2 IMPLANT
DRSG TEGADERM 4X4.75 (GAUZE/BANDAGES/DRESSINGS) ×2 IMPLANT
DURAPREP 26ML APPLICATOR (WOUND CARE) ×2 IMPLANT
ELECT REM PT RETURN 15FT ADLT (MISCELLANEOUS) ×2 IMPLANT
EVACUATOR 1/8 PVC DRAIN (DRAIN) ×2 IMPLANT
GAUZE SPONGE 2X2 8PLY STRL LF (GAUZE/BANDAGES/DRESSINGS) ×1 IMPLANT
GAUZE SPONGE 4X4 12PLY STRL (GAUZE/BANDAGES/DRESSINGS) ×2 IMPLANT
GLOVE BIOGEL M 7.0 STRL (GLOVE) IMPLANT
GLOVE BIOGEL PI IND STRL 7.5 (GLOVE) ×1 IMPLANT
GLOVE BIOGEL PI IND STRL 8 (GLOVE) ×1 IMPLANT
GLOVE BIOGEL PI IND STRL 8.5 (GLOVE) ×1 IMPLANT
GLOVE BIOGEL PI INDICATOR 7.5 (GLOVE) ×1
GLOVE BIOGEL PI INDICATOR 8 (GLOVE) ×1
GLOVE BIOGEL PI INDICATOR 8.5 (GLOVE) ×1
GLOVE ECLIPSE 8.0 STRL XLNG CF (GLOVE) ×2 IMPLANT
GLOVE ORTHO TXT STRL SZ7.5 (GLOVE) ×4 IMPLANT
GLOVE SURG ORTHO 8.0 STRL STRW (GLOVE) ×2 IMPLANT
GLOVE SURG SS PI 6.5 STRL IVOR (GLOVE) ×2 IMPLANT
GOWN STRL REUS W/TWL LRG LVL3 (GOWN DISPOSABLE) ×2 IMPLANT
GOWN STRL REUS W/TWL XL LVL3 (GOWN DISPOSABLE) ×4 IMPLANT
KIT BASIN OR (CUSTOM PROCEDURE TRAY) ×2 IMPLANT
MANIFOLD NEPTUNE II (INSTRUMENTS) ×2 IMPLANT
PACK TOTAL JOINT (CUSTOM PROCEDURE TRAY) ×2 IMPLANT
POSITIONER SURGICAL ARM (MISCELLANEOUS) ×2 IMPLANT
SPONGE GAUZE 2X2 STER 10/PKG (GAUZE/BANDAGES/DRESSINGS) ×1
SUT MNCRL AB 4-0 PS2 18 (SUTURE) ×2 IMPLANT
SUT VIC AB 1 CT1 36 (SUTURE) ×4 IMPLANT
SUT VIC AB 2-0 CT1 27 (SUTURE) ×3
SUT VIC AB 2-0 CT1 TAPERPNT 27 (SUTURE) ×3 IMPLANT
SUT VLOC 180 0 24IN GS25 (SUTURE) ×2 IMPLANT
TOWEL OR 17X26 10 PK STRL BLUE (TOWEL DISPOSABLE) ×4 IMPLANT
WRAP KNEE MAXI GEL POST OP (GAUZE/BANDAGES/DRESSINGS) ×2 IMPLANT

## 2017-07-11 NOTE — Anesthesia Postprocedure Evaluation (Signed)
Anesthesia Post Note  Patient: [name]  Procedure(s) Performed: Excision of saphenous nerve left knee. Right knee intra-articular injection. (Left )     Patient location during evaluation: PACU Anesthesia Type: General Level of consciousness: awake and alert Pain management: pain level controlled Vital Signs Assessment: post-procedure vital signs reviewed and stable Respiratory status: spontaneous breathing, nonlabored ventilation, respiratory function stable and patient connected to nasal cannula oxygen Cardiovascular status: blood pressure returned to baseline and stable Postop Assessment: no apparent nausea or vomiting Anesthetic complications: no    Last Vitals:  Vitals:   07/11/17 1200 07/11/17 1215  BP: (!) 123/59 (!) 121/58  Pulse: (!) 49 (!) 50  Resp: 16 16  Temp:    SpO2: 100% 100%    Last Pain:  Vitals:   07/11/17 1215  TempSrc:   PainSc: Asleep                 Montez Hageman

## 2017-07-11 NOTE — Anesthesia Preprocedure Evaluation (Addendum)
Anesthesia Evaluation  Patient identified by MRN, date of birth, ID band Patient awake    Reviewed: Allergy & Precautions, NPO status , Patient's Chart, lab work & pertinent test results  History of Anesthesia Complications (+) PONV  Airway Mallampati: II  TM Distance: >3 FB Neck ROM: Full    Dental no notable dental hx.    Pulmonary neg pulmonary ROS, sleep apnea ,    Pulmonary exam normal breath sounds clear to auscultation       Cardiovascular hypertension, Pt. on medications negative cardio ROS Normal cardiovascular exam Rhythm:Regular Rate:Normal     Neuro/Psych negative neurological ROS  negative psych ROS   GI/Hepatic negative GI ROS, Neg liver ROS,   Endo/Other  negative endocrine ROSHypothyroidism   Renal/GU negative Renal ROS  negative genitourinary   Musculoskeletal negative musculoskeletal ROS (+)   Abdominal   Peds negative pediatric ROS (+)  Hematology negative hematology ROS (+)   Anesthesia Other Findings   Reproductive/Obstetrics negative OB ROS                             Anesthesia Physical Anesthesia Plan  ASA: II  Anesthesia Plan: General   Post-op Pain Management:  Regional for Post-op pain   Induction: Intravenous  PONV Risk Score and Plan: 4 or greater and Ondansetron, Dexamethasone, Midazolam and Treatment may vary due to age or medical condition  Airway Management Planned: LMA  Additional Equipment:   Intra-op Plan:   Post-operative Plan: Extubation in OR  Informed Consent: I have reviewed the patients History and Physical, chart, labs and discussed the procedure including the risks, benefits and alternatives for the proposed anesthesia with the patient or authorized representative who has indicated his/her understanding and acceptance.   Dental advisory given  Plan Discussed with: CRNA  Anesthesia Plan Comments:         Anesthesia  Quick Evaluation

## 2017-07-11 NOTE — Progress Notes (Signed)
AssistedDr. Carignan with left, ultrasound guided, adductor canal block. Side rails up, monitors on throughout procedure. See vital signs in flow sheet. Tolerated Procedure well.  

## 2017-07-11 NOTE — Interval H&P Note (Signed)
History and Physical Interval Note:  07/11/2017 7:09 AM  [name]  has presented today for surgery, with the diagnosis of Status post painful saphenous nerve  The various methods of treatment have been discussed with the patient and family. After consideration of risks, benefits and other options for treatment, the patient has consented to  Procedure(s) with comments: Excision of saphenous nerve left knee (Left) plus perform intra-articular cortisone injection into right knee - 60 mins as a surgical intervention .  The patient's history has been reviewed, patient examined, no change in status, stable for surgery.  I have reviewed the patient's chart and labs.  Questions were answered to the patient's satisfaction.     Mauri Pole

## 2017-07-11 NOTE — Anesthesia Procedure Notes (Signed)
Procedure Name: LMA Insertion Date/Time: 07/11/2017 10:17 AM Performed by: Glory Buff, CRNA Pre-anesthesia Checklist: Patient identified, Emergency Drugs available, Suction available and Patient being monitored Patient Re-evaluated:Patient Re-evaluated prior to induction Oxygen Delivery Method: Circle system utilized Preoxygenation: Pre-oxygenation with 100% oxygen Induction Type: IV induction LMA: LMA with gastric port inserted LMA Size: 4.0 Number of attempts: 1 Placement Confirmation: positive ETCO2 Tube secured with: Tape Dental Injury: Teeth and Oropharynx as per pre-operative assessment

## 2017-07-11 NOTE — Brief Op Note (Signed)
07/11/2017  10:49 AM  PATIENT:  [name]  72 y.o. 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 Right knee arthritis and pain.   POST-OPERATIVE DIAGNOSIS:  Status post left total knee replacement saphenous neuritis. Right knee arthritis and pain.   PROCEDURE:  Procedure(s) with comments: Excision of saphenous nerve left knee (excison major peripheral nerve except sciatic). Right knee intra-articular injection. (Left) - 60 mins  SURGEON:  Surgeon(s) and Role:    * Paralee Cancel, MD - Primary  PHYSICIAN ASSISTANT: Danae Orleans, PA-C  ANESTHESIA:   general  EBL: Minimal  BLOOD ADMINISTERED:none  DRAINS: none   LOCAL MEDICATIONS USED:  MARCAINE     SPECIMEN:  No Specimen  DISPOSITION OF SPECIMEN:  N/A  COUNTS:  YES  TOURNIQUET:   Total Tourniquet Time Documented: Thigh (Left) - 5 minutes Total: Thigh (Left) - 5 minutes   DICTATION: .Other Dictation: Dictation Number [id number]  PLAN OF CARE: Discharge to home after PACU  PATIENT DISPOSITION:  PACU - hemodynamically stable.   Delay start of Pharmacological VTE agent (>24hrs) due to surgical blood loss or risk of bleeding: no

## 2017-07-11 NOTE — Progress Notes (Signed)
Called and talked with [employer] PA and he is calling in hydrocodone and phenergan into the [facility] in [place] in case she needs it for home.  Tonita Cong and patient are aware.

## 2017-07-11 NOTE — Transfer of Care (Signed)
Immediate Anesthesia Transfer of Care Note  Patient: [name]  Procedure(s) Performed: Excision of saphenous nerve left knee. Right knee intra-articular injection. (Left )  Patient Location: PACU  Anesthesia Type:General  Level of Consciousness: awake, drowsy and responds to stimulation  Airway & Oxygen Therapy: Patient Spontanous Breathing and Patient connected to face mask oxygen  Post-op Assessment: Report given to RN and Post -op Vital signs reviewed and stable  Post vital signs: Reviewed and stable  Last Vitals:  Vitals Value Taken Time  BP 120/56 07/11/2017 11:10 AM  Temp    Pulse 56 07/11/2017 11:11 AM  Resp 19 07/11/2017 11:11 AM  SpO2 100 % 07/11/2017 11:11 AM  Vitals shown include unvalidated device data.  Last Pain:  Vitals:   07/11/17 0639  TempSrc:   PainSc: 7       Patients Stated Pain Goal: 4 (35/67/01 4103)  Complications: No apparent anesthesia complications

## 2017-07-11 NOTE — Discharge Instructions (Signed)
Prescriptions for phenergan and hydrocodone will be [facility] into [facility] [address], [place], [place].

## 2017-07-11 NOTE — Anesthesia Procedure Notes (Signed)
Anesthesia Regional Block: Adductor canal block   Pre-Anesthetic Checklist: ,, timeout performed, Correct Patient, Correct Site, Correct Laterality, Correct Procedure, Correct Position, site marked, Risks and benefits discussed,  Surgical consent,  Pre-op evaluation,  At surgeon's request and post-op pain management  Laterality: Left and Lower  Prep: Maximum Sterile Barrier Precautions used, chloraprep       Needles:  Injection technique: Single-shot  Needle Type: Echogenic Stimulator Needle     Needle Length: 10cm      Additional Needles:   Procedures:,,,, ultrasound used (permanent image in chart),,,,  Narrative:  Start time: 07/11/2017 8:59 AM End time: 07/11/2017 9:09 AM Injection made incrementally with aspirations every 5 mL.  Performed by: Personally  Anesthesiologist: Montez Hageman, MD  Additional Notes: Risks, benefits and alternative to block explained extensively.  Patient tolerated procedure well, without complications.

## 2017-07-11 NOTE — Op Note (Signed)
NAMESTEPHEN, TURNBAUGH MEDICAL RECORD DX:[id number] ACCOUNT [id number] DATE OF BIRTH:1945-11-13 FACILITY: [facility] LOCATION: [facility] PHYSICIAN:Deedra Pro DAlvan Dame, MD  OPERATIVE REPORT  DATE OF PROCEDURE:  07/11/2017  PREOPERATIVE DIAGNOSES:   1.  Painful left knee following total knee arthroplasty felt to be related to painful saphenous nerve branches of the inferior medial aspect of her knee. 2.  Right knee arthritis and pain.  POSTOPERATIVE DIAGNOSES:   1.  Painful left knee following total knee arthroplasty felt to be related to painful saphenous nerve branches of the inferior medial aspect of her knee. 2.  Right knee arthritis and pain.  PROCEDURES: 1.  Excision of the saphenous nerve branches in the infrapatellar region. 2.  Right knee intra-articular injection.  SURGEON:  Pietro Cassis. Alvan Dame, MD  ASSISTANT:  Danae Orleans PA-C.  Note that Mr. Guinevere Scarlet was present for the entirety of the case from preoperative positioning, perioperative management of the operative extremity, sterile face of the case and primary wound closure as well as  intra-articular injection.  ANESTHESIA:  General LMA.  LOSS:  None.  COMPLICATIONS:  None.  TOURNIQUET:  Utilized at 200 mmHg for 5 minutes.  INDICATIONS:  The patient is a 72 year old female with history of left total knee replacement.  She presented to the office for evaluation of a painful left knee.  Her radiographs have indicated relatively stable components.  Her exam revealed  hypersensitivity to touch along the medial aspect of the joint.  This did not appear to be deep enough palpation of the affect the joint.  I did do a bone scan which had indicated a slight bit of uptake around her component.  However, her clinical exam  did not match this.  We thus had a lengthy discussion regarding the saphenous nerve issue.  We discussed the implication of the bone scan.  We both concluded that we would go for a more simple procedure initially to provide  pain relief by trying to  excise the saphenous nerve branches and create numbness in the medial aspect incision to see if this will help.  In addition, she had right knee arthritis and pain and she wished to have it injected today.  We reviewed that as well.  Consent was obtained  for obtained for above and the benefit of pain relief.  DESCRIPTION OF PROCEDURE:  The patient was brought to the operative theater.  Once adequate anesthesia, preoperative antibiotics, Ancef administered.  She was positioned supine.  A left thigh tourniquet was placed.  Left lower extremity was then prepped  and draped in sterile fashion.  Timeout was performed identifying the patient, the planned procedure and extremity.  The leg was exsanguinated and tourniquet elevated to 200 mmHg.  The lower half of her incision was demarcated and then incised.  Soft tissue planes were created down to the capsule of the knee medially.  I then carefully, due to the thin nature of her  skin, excised the subcutaneous tissue, medially elevating this and the dermal plane.  I went more medial to the area where she is most tender.  I did feel like I identified the major branch of the infrapatellar of the saphenous nerve branches and  infrapatellar.  I was able to excise this back as well as fat pad.  I did continue this in a half elliptical area from the incision over the medial.  I felt that I was able to excise any of the soft tissue branches of the saphenous nerve that could be  there.  Upon completion of this,  I let the tourniquet down.  There was a little bit of hemostasis required other than punctate bleeding.  We irrigated the wound out.  Upon completion, the skin was closed in layers with Monocryl closure.  The knee was then cleaned, dried and dressed sterilely with surgical glue and Aquacel dressing.  Upon conclusion of the left knee procedure, the right knee was prepped with Betadine and then injected with 80 mg of Depo-Medrol and  Marcaine.  Upon conclusion, she was awoken from anesthesia and brought to the recovery room in stable condition.  The plan will be for her to recover in the recovery room and then discharged home.  Postoperative plan was reviewed with the family.  I will see her back in the office in 2 weeks for wound check.  AN/NUANCE  D:07/11/2017 T:07/11/2017 JOB:[id number]

## 2017-07-12 ENCOUNTER — Encounter (HOSPITAL_COMMUNITY): Payer: Self-pay | Admitting: Orthopedic Surgery

## 2017-07-22 ENCOUNTER — Ambulatory Visit: Payer: Medicare Other | Admitting: Pulmonary Disease

## 2017-07-22 ENCOUNTER — Other Ambulatory Visit: Payer: Self-pay | Admitting: Pulmonary Disease

## 2017-07-26 ENCOUNTER — Telehealth: Payer: Self-pay | Admitting: Pulmonary Disease

## 2017-07-26 NOTE — Telephone Encounter (Signed)
Received call from [facility].  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 Faxed received from [facility] 07/26/17.

## 2017-07-26 NOTE — Telephone Encounter (Signed)
SN aware of fax. Nothing further at this time.

## 2017-07-29 NOTE — Telephone Encounter (Signed)
Rx for Tramdol 50mg  has been phoned in to [facility] [place]. Nothing further is needed.

## 2017-07-29 NOTE — Telephone Encounter (Signed)
Pt is requesting refill on Tramdol 50mg .  Last refilled 07/11/17 #20-tid prn for pain. Last OV 06/17/17 with pending OV for 08/08/17.   CY please advise, as SN is unavailable. Thanks  Current Outpatient Medications on File Prior to Visit  Medication Sig Dispense Refill  . acetaminophen (TYLENOL) 500 MG tablet Take 1,000 mg by mouth every 8 (eight) hours as needed for mild pain.    Marland Kitchen aspirin EC 81 MG tablet Take 81 mg by mouth daily as needed (chest pain).     Marland Kitchen azelastine (ASTELIN) 0.1 % nasal spray Use two sprays in each nostril twice daily as directed 30 mL 5  . Cholecalciferol (VITAMIN D) 2000 units CAPS Take 2,000 Units by mouth daily.    Marland Kitchen Dextromethorphan-guaiFENesin (COUGH & CHEST CONGESTION DM) 5-100 MG/5ML LIQD Take 5 mLs by mouth daily as needed (cough).    . dicyclomine (BENTYL) 10 MG capsule Take 1 capsule (10 mg total) by mouth 2 (two) times daily. (Patient taking differently: Take 10 mg by mouth 3 (three) times daily. ) 60 capsule 0  . HYDROcodone-acetaminophen (NORCO) 5-325 MG tablet Take 1 tablet by mouth every 6 (six) hours as needed for moderate pain. 30 tablet 0  . ipratropium (ATROVENT) 0.06 % nasal spray Use two sprays in each nostril 3-4 times daily to dry up nose 15 mL 5  . levothyroxine (SYNTHROID, LEVOTHROID) 88 MCG tablet Take 88 mcg by mouth daily before breakfast.    . LORazepam (ATIVAN) 0.5 MG tablet Take 0.5 mg by mouth at bedtime.   0  . methocarbamol (ROBAXIN) 500 MG tablet Take 1 tablet (500 mg total) by mouth 3 (three) times daily as needed. (Patient taking differently: Take 500 mg by mouth at bedtime. ) 60 tablet 1  . omeprazole (PRILOSEC) 40 MG capsule Take 40 mg by mouth daily with supper.    . promethazine (PHENERGAN) 12.5 MG tablet Take 1 tablet (12.5 mg total) by mouth every 6 (six) hours as needed for nausea or vomiting. 30 tablet 0  . traMADol (ULTRAM) 50 MG tablet Take 1 tablet (50 mg total) by mouth 3 (three) times daily as needed for moderate pain. As  directed 20 tablet 0  . valACYclovir (VALTREX) 500 MG tablet Take 500 mg by mouth 2 (two) times daily as needed (cold sores).     No current facility-administered medications on file prior to visit.     Allergies  Allergen Reactions  . Neurontin [Gabapentin] Other (See Comments)    Severe falls,dizziness, forgetful   . Ciprofloxacin Diarrhea  . Codeine Nausea And Vomiting  . Other Nausea And Vomiting    General anesthesia   . Oxycodone Nausea And Vomiting  . Tramadol     REACTION: GI discomfort with high doses  . Cyclobenzaprine Palpitations  . Sulfa Antibiotics Rash

## 2017-07-29 NOTE — Telephone Encounter (Signed)
[place] # [phone], 1 tab 3 x daily as needed. To last until next ov with Dr Lenna Gilford.

## 2017-08-02 ENCOUNTER — Telehealth: Payer: Self-pay | Admitting: Pulmonary Disease

## 2017-08-02 ENCOUNTER — Encounter: Payer: Self-pay | Admitting: Primary Care

## 2017-08-02 ENCOUNTER — Ambulatory Visit (INDEPENDENT_AMBULATORY_CARE_PROVIDER_SITE_OTHER): Payer: Medicare Other | Admitting: Primary Care

## 2017-08-02 DIAGNOSIS — K219 Gastro-esophageal reflux disease without esophagitis: Secondary | ICD-10-CM

## 2017-08-02 DIAGNOSIS — R05 Cough: Secondary | ICD-10-CM | POA: Diagnosis not present

## 2017-08-02 DIAGNOSIS — R058 Other specified cough: Secondary | ICD-10-CM

## 2017-08-02 MED ORDER — ALBUTEROL SULFATE HFA 108 (90 BASE) MCG/ACT IN AERS: 2.0000 | INHALATION_SPRAY | Freq: Four times a day (QID) | RESPIRATORY_TRACT | 1 refills | Status: DC | PRN

## 2017-08-02 NOTE — Telephone Encounter (Signed)
Spoke with the pt  She states that her cough has returned over the past 2 wks  She is coughing up large amounts of clear to yellow sputum  She is also c/o nausea due to taking tramadol and she is requesting something different to help suppress cough  OV with Beth at 2 pm today

## 2017-08-02 NOTE — Patient Instructions (Signed)
Follow strict GERD diet  Add Zantac or Pepcid once daily  Continue Omeprazole 40mg    As needed Maalox or TUMs   Avoid menthol products  Try hot water with HONEY   Will send short acting bronchodilator to pharmacy -  2 puffs every 6hrs prn wheeze/cough   Follow up as needed

## 2017-08-02 NOTE — Progress Notes (Signed)
@Patient  ID: [name], female    DOB: 05/22/1945, 72 y.o.   MRN: [id number]  Chief Complaint  Patient presents with  . Acute Visit    tramadol-was helping but not so much anymore-stomach upset but no vomitting even with stomach meds-productive cough with whitish phlegm    Referring provider: Raina Mina., MD  HPI: 72 year old female. 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 Patient of Dr. Lenna Gilford, last seen on 06/17/17.  Chart review: >>Metholcholine challenge negative >>CT sinuses 2014- negative  >>PFT 2015- FVC=2.75 (92%), FEV1=2.04 (89%), %1sec=74, mid-flows min reduced at 72% predicted >> PNA 2017. Abn CT chest w/several small nodules and one 8-88mm LLL lesion> Serial CT scans w/o progression incr size and PET NEG 06/2017  08/03/2017 Patient presents today with complaints of cough. She has a long standing history of cough x 5 years. States that she was getting better until recently. She was prescribed tramadol TID which is now causing her to be nauseous. Last dose was yesterday. She states that her stomach is torn up since taking pain medication. Associated burping and indigestion. She continues taking omeprazole daily. Cough is dry, occ productive with clear sputu,. She has been taking robitussin OTC. Tessalon perle's has been ineffective in the past and she can not take Neurontin.    Allergies  Allergen Reactions  . Neurontin [Gabapentin] Other (See Comments)    Severe falls,dizziness, forgetful   . Ciprofloxacin Diarrhea  . Codeine Nausea And Vomiting  . Other Nausea And Vomiting    General anesthesia   . Oxycodone Nausea And Vomiting  . Tramadol     REACTION: GI discomfort with high doses  . Cyclobenzaprine Palpitations  . Sulfa Antibiotics Rash    Immunization History  Administered Date(s) Administered  . Influenza Split 11/01/2016  . Influenza-Unspecified 10/01/2013  .  Pneumococcal Polysaccharide-23 01/01/2010    Past Medical History:  Diagnosis Date  . Abnormal weight loss   . Anxiety    antianxiety med. usually at night but can take during the day for anxiety  . Arthritis    both knees & hands  also the back & neck  . Asthma   . Atrial fibrillation ([place])    pt. reports that [facility] that she had a "slight afib.", no further test needed (01/07/14 cardiology note does not mention any afib, just poor anterior r wave progression)  . Chronic cough   . Complication of anesthesia   . Cough    chronic, pt. reports for 3.5 yrs.   . Diaphragmatic hernia   . Diarrhea    pt. reports that she has irritable bowel, loose stool & diarrhea both about 6 times per day, right after she eats.  . Endometriosis   . Gastric polyp   . Gastroenteritis   . GERD (gastroesophageal reflux disease)   . GERD (gastroesophageal reflux disease)   . History of hiatal hernia    pt. has been told that the hiatal hernia has reappeared  . Hypercholesteremia   . Hyperlipidemia   . Hypertension    pt. reports that she use to take antihtn med, but reports since weight loss she has been able to come off.    . Hypothyroidism   . IBS (irritable bowel syndrome)    with diarrhea  . Insomnia   . Melanoma ([place]) 2011   heel  . OSA (obstructive sleep apnea)    does not tolerate CPAP , pt.  reports that its severe, but doesn't use it anymore, since weight loss.   Study done in [place], not sure where.   . Osteoarthritis   . PONV (postoperative nausea and vomiting)   . Vitamin B 12 deficiency   . Vitamin D deficiency     Tobacco History: Social History   Tobacco Use  Smoking Status Never Smoker  Smokeless Tobacco Never Used   Counseling given: Not Answered   Outpatient Medications Prior to Visit  Medication Sig Dispense Refill  . acetaminophen (TYLENOL) 500 MG tablet Take 1,000 mg by mouth every 8 (eight) hours as needed for mild pain.    Marland Kitchen dicyclomine (BENTYL) 10 MG capsule  Take 1 capsule (10 mg total) by mouth 2 (two) times daily. (Patient taking differently: Take 10 mg by mouth 3 (three) times daily. ) 60 capsule 0  . HYDROcodone-acetaminophen (NORCO) 5-325 MG tablet Take 1 tablet by mouth every 6 (six) hours as needed for moderate pain. 30 tablet 0  . levothyroxine (SYNTHROID, LEVOTHROID) 88 MCG tablet Take 88 mcg by mouth daily before breakfast.    . LORazepam (ATIVAN) 0.5 MG tablet Take 0.5 mg by mouth at bedtime.   0  . methocarbamol (ROBAXIN) 500 MG tablet Take 1 tablet (500 mg total) by mouth 3 (three) times daily as needed. (Patient taking differently: Take 500 mg by mouth at bedtime. ) 60 tablet 1  . omeprazole (PRILOSEC) 40 MG capsule Take 40 mg by mouth daily with supper.    . promethazine (PHENERGAN) 12.5 MG tablet Take 1 tablet (12.5 mg total) by mouth every 6 (six) hours as needed for nausea or vomiting. 30 tablet 0  . traMADol (ULTRAM) 50 MG tablet TAKE 1 TABLET BY MOUTH 3 TIMES A DAY AS NEEDED 50 tablet 0  . aspirin EC 81 MG tablet Take 81 mg by mouth daily as needed (chest pain).     Marland Kitchen azelastine (ASTELIN) 0.1 % nasal spray Use two sprays in each nostril twice daily as directed (Patient not taking: Reported on 08/02/2017) 30 mL 5  . Cholecalciferol (VITAMIN D) 2000 units CAPS Take 2,000 Units by mouth daily.    Marland Kitchen Dextromethorphan-guaiFENesin (COUGH & CHEST CONGESTION DM) 5-100 MG/5ML LIQD Take 5 mLs by mouth daily as needed (cough).    Marland Kitchen ipratropium (ATROVENT) 0.06 % nasal spray Use two sprays in each nostril 3-4 times daily to dry up nose (Patient not taking: Reported on 08/02/2017) 15 mL 5  . valACYclovir (VALTREX) 500 MG tablet Take 500 mg by mouth 2 (two) times daily as needed (cold sores).     No facility-administered medications prior to visit.     Review of Systems  Review of Systems  Constitutional: Negative.   HENT: Negative.   Respiratory: Positive for cough. Negative for shortness of breath.   Cardiovascular: Negative.     Gastrointestinal: Positive for nausea. Negative for blood in stool and vomiting.    Physical Exam  BP 130/70 (BP Location: Right Leg, Cuff Size: Normal)   Pulse (!) 108   Ht 5\' 3"  (1.6 m)   Wt 142 lb 9.6 oz (64.7 kg)   SpO2 98%   BMI 25.26 kg/m  Physical Exam  Constitutional: She is oriented to person, place, and time. She appears well-developed and well-nourished.  HENT:  Head: Normocephalic and atraumatic.  Eyes: Pupils are equal, round, and reactive to light. EOM are normal.  Neck: Normal range of motion. Neck supple.  Cardiovascular: Normal rate, regular rhythm and normal heart sounds.  No murmur heard. Pulmonary/Chest: Effort normal and breath sounds normal. No respiratory distress. She has no wheezes.  [place] t/o. Dry, reactive cough. No stridor or wheezing.   Abdominal: Soft. Bowel sounds are normal. There is no tenderness.  Neurological: She is alert and oriented to person, place, and time.  Skin: Skin is warm and dry. No rash noted. No erythema.  Psychiatric: She has a normal mood and affect. Her behavior is normal. Judgment normal.     Lab Results:  CBC    Component Value Date/Time   WBC 5.7 07/08/2017 1434   RBC 4.45 07/08/2017 1434   HGB 13.6 07/08/2017 1434   HCT 41.6 07/08/2017 1434   PLT 319 07/08/2017 1434   MCV 93.5 07/08/2017 1434   MCH 30.6 07/08/2017 1434   MCHC 32.7 07/08/2017 1434   RDW 13.5 07/08/2017 1434    BMET    Component Value Date/Time   NA 142 07/08/2017 1434   K 4.5 07/08/2017 1434   CL 110 07/08/2017 1434   CO2 26 07/08/2017 1434   GLUCOSE 88 07/08/2017 1434   BUN 14 07/08/2017 1434   CREATININE 0.78 07/08/2017 1434   CALCIUM 9.2 07/08/2017 1434   GFRNONAA >60 07/08/2017 1434   GFRAA >60 07/08/2017 1434    BNP No results found for: BNP  ProBNP No results found for: PROBNP  Imaging: No results found.   Assessment & Plan:   GERD (gastroesophageal reflux disease) Patient experiencing a increase in GERD symptoms since  taking tramadol for cough. Recommend adding Zantac/pepcid and following strict GERD diet (educational material given and reviewed)  Upper airway cough syndrome Long standing hx cough. Recently exacerbated by increase in reflux symptoms, unfortunately she can not tolerate tramadol or neurontin. Tessalon perles have not helped in the past. Try delsym OTC and sent RX for SABA as needed for wheeze/coughing fits. Avoid menthol. Try hot water with Honey. Recommend controlling GERD symptoms to help with cough as well.       Martyn Ehrich, NP 08/03/2017

## 2017-08-03 ENCOUNTER — Encounter: Payer: Self-pay | Admitting: Primary Care

## 2017-08-03 NOTE — Assessment & Plan Note (Addendum)
Long standing hx cough. Recently exacerbated by increase in reflux symptoms, unfortunately she can not tolerate tramadol or neurontin. Tessalon perles have not helped in the past. Try delsym OTC and sent RX for SABA as needed for wheeze/coughing fits. Avoid menthol. Try hot water with Honey. Recommend controlling GERD symptoms to help with cough as well.

## 2017-08-03 NOTE — Assessment & Plan Note (Addendum)
Patient experiencing a increase in GERD symptoms since taking tramadol for cough. Recommend adding Zantac/pepcid and following strict GERD diet (educational material given and reviewed)

## 2017-08-06 MED FILL — Methylprednisolone Acetate Inj Susp 40 MG/ML: INTRAMUSCULAR | Qty: 2 | Status: AC

## 2017-08-08 ENCOUNTER — Ambulatory Visit: Payer: Medicare Other | Admitting: Pulmonary Disease

## 2017-08-08 ENCOUNTER — Other Ambulatory Visit: Payer: Self-pay | Admitting: Gastroenterology

## 2017-08-08 NOTE — Telephone Encounter (Signed)
Patient requesting a refill of medication dicyclomine be sent to [facility] and a call when completed.

## 2017-08-09 MED ORDER — DICYCLOMINE HCL 10 MG PO CAPS: 10.0000 mg | ORAL_CAPSULE | Freq: Two times a day (BID) | ORAL | 4 refills | Status: DC

## 2017-08-09 NOTE — Telephone Encounter (Signed)
Sent refills to patients pharmacy.  

## 2017-08-14 ENCOUNTER — Other Ambulatory Visit: Payer: Self-pay

## 2017-08-14 ENCOUNTER — Telehealth: Payer: Self-pay

## 2017-08-14 ENCOUNTER — Telehealth: Payer: Self-pay | Admitting: Gastroenterology

## 2017-08-14 DIAGNOSIS — R197 Diarrhea, unspecified: Secondary | ICD-10-CM

## 2017-08-14 MED ORDER — DICYCLOMINE HCL 10 MG PO CAPS
10.0000 mg | ORAL_CAPSULE | Freq: Four times a day (QID) | ORAL | 6 refills | Status: DC
Start: 2017-08-14 — End: 2019-01-15

## 2017-08-14 NOTE — Telephone Encounter (Signed)
Her last refill on Bentyl was sent in for BID dosing, she says she takes it QID.  Please advise.  Thank you.

## 2017-08-14 NOTE — Telephone Encounter (Signed)
4 times a day is fine #120, 6 refills

## 2017-08-14 NOTE — Telephone Encounter (Signed)
New prescription sent to pharmacy 

## 2017-08-21 LAB — RESPIRATORY CULTURE OR RESPIRATORY AND SPUTUM CULTURE
MICRO NUMBER:: 90739840
RESULT:: NORMAL
SPECIMEN QUALITY:: ADEQUATE

## 2017-08-21 LAB — FUNGUS CULTURE W SMEAR
MICRO NUMBER:: 90739838
SMEAR:: NONE SEEN
SPECIMEN QUALITY:: ADEQUATE

## 2017-08-21 LAB — MYCOBACTERIA,CULT W/FLUOROCHROME SMEAR
MICRO NUMBER:: 90739839
SMEAR:: NONE SEEN
SPECIMEN QUALITY:: ADEQUATE

## 2017-08-22 ENCOUNTER — Encounter: Payer: Self-pay | Admitting: Pulmonary Disease

## 2017-08-22 ENCOUNTER — Ambulatory Visit (INDEPENDENT_AMBULATORY_CARE_PROVIDER_SITE_OTHER): Payer: Medicare Other | Admitting: Pulmonary Disease

## 2017-08-22 VITALS — BP 132/70 | HR 107 | Temp 98.1°F | Ht 62.5 in | Wt 144.4 lb

## 2017-08-22 DIAGNOSIS — K449 Diaphragmatic hernia without obstruction or gangrene: Secondary | ICD-10-CM

## 2017-08-22 DIAGNOSIS — J45991 Cough variant asthma: Secondary | ICD-10-CM

## 2017-08-22 DIAGNOSIS — J683 Other acute and subacute respiratory conditions due to chemicals, gases, fumes and vapors: Secondary | ICD-10-CM | POA: Diagnosis not present

## 2017-08-22 DIAGNOSIS — R918 Other nonspecific abnormal finding of lung field: Secondary | ICD-10-CM

## 2017-08-22 DIAGNOSIS — Z85820 Personal history of malignant melanoma of skin: Secondary | ICD-10-CM

## 2017-08-22 DIAGNOSIS — K219 Gastro-esophageal reflux disease without esophagitis: Secondary | ICD-10-CM | POA: Diagnosis not present

## 2017-08-22 HISTORY — DX: Other nonspecific abnormal finding of lung field: R91.8

## 2017-08-22 MED ORDER — FIRST-DUKES MOUTHWASH MT SUSP: 5.0000 mL | Freq: Three times a day (TID) | OROMUCOSAL | 0 refills | Status: DC

## 2017-08-22 MED ORDER — GUAIFENESIN-CODEINE 100-10 MG/5ML PO SYRP: 5.0000 mL | ORAL_SOLUTION | Freq: Three times a day (TID) | ORAL | 0 refills | Status: DC | PRN

## 2017-08-22 NOTE — Progress Notes (Addendum)
Subjective:    Patient ID: [name], female    DOB: [birth year], 72 y.o.   MRN: [id number]  HPI  09/29/2013: f/u appt w/ PW>  Chief Complaint  Patient presents with  . Follow-up    c/o increased hoarseness, clearing throat a lot, not able to rest voice much due to company, sob-same,cough-yellow,no fcs, chest heavy ,diarrhea     F/u upper airway cyclical cough, no true asthma Pt was ok until 1.5 week ago: company ill, viral issues.  Now cough is productive clear to white. occ yellow. Notes pn drip severe. Throat is scratchy. Using the drops. 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 Now with acute right maxillary sinusitis with postnasal drip precipitating current cough paroxysms, note right external auditory canal occluded with cerumen    Plan> Need to clear her right ear of cerumen  Take azithromycin 250mg  Take two once then one daily until gone Take prednisone 10mg  Take 4 for two days three for two days two for two days one for two days Stay on flonase nasal spray Stay on chlor trimeton Use saline nasal spray three times daily Follow cough protocol with Delsym         11/24/2013: f/u appt w/ PW> Chief Complaint  Patient presents with  . 2 month follow up    Cough greatly improved while on cough protocol.  Notices cough worsens after decreasing delsym.  Cough prod over the past few days with white to yellow mucus.  No wheezing, chest tightnes, CP, or f/c/s.       Pt got better a while then got worse.  the patient is still on antihistamine and flonase.  Forgets the nasal spray.  Cannot get the delsym off. Got ears cleaned out.  Cough worse past two days yellow mucus.  No nasal d/c.  No postnasal drip. No GERD symptoms.  Pt with diarrhea for 1 year: goes 4-5x per day No wheezing, no dyspnea.     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 disease as a primary precipitating factor Note inhaled  steroids have not previously been of much benefit Acute tracheobronchitis currently    Plan> Stop protonix due to diarrhea Increase acidophilus to twice daily Cefuroxime 500mg  twice daily x 5days Prednisone 10mg  Take 4 for two days three for two days two for two days one for two days Benzonatate 1-2 every 4-6 hours as needed for cough and delsym as needed : per cough protocol Stay on chlorpheniramine/fluticasone USe flutter valve 3 - 4 x daily  ~  July 19, 2014:  28mo ROV & add-on appt w/ SN> her PCP is DrGrisso in [place]    72 y/o WF pt of PW w/ RADS, cough variant asthma, not on any regular inhalers...  Prev CT Chest 05/27/12 at [facility] (they report hx melanoma) showed scarring left lung base 7-2mm nodular component anteriorly is unchanged, no adenopathy, borderline heart size & tort Ao  CT Sinuses 10/23/12 was neg, clear sinuses...   Prev PFT> 06/03/13 showed FVC=2.75 (92%), FEV1=2.04 (89%), %1sec=74, mid-flows min reduced at 72% predicted; METHACHOLINE challenge was NEG...  Last CXR> 02/12/13 showed norm heart size, clear lungs, mild DD Tspine...  She had left TKR 09/4763 w/ complications => required hydrocodone for pain & she notes cough resolved (note- she tells me she is due to have a screw removed);  She presents on this occas w/ 9mo hx cough, mostly dry, raspy/ scratchy throat, min  clear whitish sput, no hemoptysis, only SOB occurs w/ the coughing paraoxysms & the cough was worse at night- assoc w/ reflux symptoms & she reports Hx HH surg by DrSmith in [facility] 02/2012 but notes that "it's back" and may need GI eval...     EXAM reveals Afeb, VSS, O2sat=99% on RA;  HEENT- neg, mallampati2;  Chest- clear x dry cough, weak raspy voice;  Heart- RR w/o m/r/g;  Abd- soft, nontender;  Ext- w/o c/c/e...  CXR today 07/19/14 showed clear lungs, no infiltrates etc... IMP/PLAN>>  Trayce has RADS/ AB with prob nocturnal reflux trigger and we discussed a vigorous antireflux regimen- Protonix40  before dinner, NPO after dinner, elev HOB, Pepcid40 qhs;  In addition- trial Dulera200 (not covered, therefore go w/ SYMBICORT160- 2spBid w/ spacer, plus Tussionex, etc...  ROV recheck in 6-8 weeks... F ~  September 15, 2014:  24mo ROV w/ SN>  Kateryna stopped the Symbicort due to price & never filled the Tussionex, but she has followed a vigorous antireflux regimen w/ Protonix40 before dinner, NPO after dinner, elev HOB 6", & Pepcid40 before bed w/sip; she notes that her cough is improved, no sput/ no blood/ denies SOB/ CP/ etc & her voice is better too;  She has prev tried Advair as well & it didn't seem to help much she says;  We discussed continuing the vigorous antireflux regimen & try BREO one inhalation daily- asked to get a copy of her prescription drug formulary for [place] to review... She reports having surg on her left heel w/ a melanoma removed by [facility], she tells me that "they got it all" and the kind that she had doesn't spread internally so she doesn't need further surg or referral to [facility] (no data avil to [place] here in [place])...  EXAM reveals Afeb, VSS, O2sat=98% on RA;  HEENT- neg, mallampati2;  Chest- clear w/o w/r/r;  Heart- RR w/o m/r/g;  Abd- soft, nontender;  Ext- w/o c/c/e...    RADS ==> try a diff ICS/LABA in BREO one inhalation daily & she will get copy of her Formulary...    Hx Cough variant asthma ==> ok to use DELSYM, TESSALON, MUCINEX OTC as needed...    Laryngopharyngeal reflux ==> she will continue vigorous antireflux regimen w/ PPI, NPO after dinner, H2blocker at bedtime, elev HOB etc...    HH/ GERD ==> may need further GI eval by her gastroenterologist, if not responding...  We reviewed prob list, meds, xrays and labs> she'll get 2016 Flu vaccine from her PCP... IMP/PLAN>>  Dariella is improved on her vigorous antireflux regimen & she will continue to apply this every night;  We decided to try [facility] & she will get a copy of her prescription drug formulary fior [place] to review;  ROV  in 14mo and sooner if needed prn...  ~  January 17, 2015:  108mo ROV & Raelyn had another URI (sinus & ear infection) treated by her PCP w/ Levaquin;  She is c/o cough, small amt clear sput, no hemoptysis but w/ post nasal drip & chest congestion;  She has hx RADS and cough variant asthma, plus LPR/ GERD;  Med list indicates Symbicort (no ttaking- $$), Singulair10, Flonase, Tessalon, Dexilant, Pepcid... She did not contact her insurance company for a copy of her formulary for [place] to review- she doesn't recall getting BREO last visit...  EXAM reveals Afeb, VSS, O2sat=98% on RA;  HEENT- neg, mallampati2;  Chest- clear w/o w/r/r;  Heart- RR w/o m/r/g;  Abd- soft, nontender;  Ext- w/o c/c/e... IMP/PLAN>>     Ajayla has an acute exac of her RADS, sl better after Levaquin from PCP but lingering congestion, cough, some SOB=> c/w airway inflammation & we discussed the need for anti-inflamm rx w/ Medrol & inhaled Breo- hopefully her new insurance ([employer]) will cover this med...     RADS ==> discussed the need for regular meds to prevent exacerbations; we will treat this exac w/ Depo80 + Medrol dosepak;  She still needs to get a copy of her prescription drug formulary, we will try the BREO- 1/d...    Hx Cough variant asthma ==> needs to take the BREO daily; ok to use DELSYM, TESSALON, [place] OTC as needed...    Laryngopharyngeal reflux ==> she will continue vigorous antireflux regimen w/ PPI, NPO after dinner, H2blocker at bedtime, elev HOB etc...    HH/ GERD ==> she has seen her gastroenterologist & they switched her to [place]...   ~  June 17, 2017:  28mo ROV & pulmonary follow up visit>  Pt is referred back to [facility] by Allergist- DrPadgett in [place] for cough and an abnormal CT chest w/ nodule;  I last saw Ethelmae 01/17/15 as above w/ RADS, cough variant asthma, GERD & LPR;  She also has a hx of an abn CT Chest dating back to 05/2012 (CT in [place]) showing scarring left lung base w/ 7-11mm nodular  component anteriorly- unchanged, no adenopathy, borderline heart size & tort Ao... We reviewed the following interval records avail to [place] in Epic EMR>      She saw PCP- DrGrisso on 1/22 ([facility]) & 02/08/17>  C/o chest congestion & cough; coughs thru the night, treated w/ ZPak, Pred, Tessalon & referred to DrKozlow...     She saw ALLERGY- DrPadgett in [place] on 06/04/17>  Followed for rhinitis/ throat clearing/ PND/ hoarseness/ cough x yrs, Hx HH repair x2, GERD & cough variant asthma, on Astelin/ Ventolin/ Norco5,  she stopped PPI,  Spirometry was normal (no obstructive dis) and RAST panel NEG;  They rechecked CT Chest- done 06/07/17- showing several tiny pulm nodules, largest lesion is ~60mm in anteromedial LLL & they rec PET scan=> NEG, no hypermetabolic activity...    She is followed by [facility]- DrOlin, DrBrooks, DrRamos>  Hx L-TKR 2016 w/ loosening, R-knee OA, cervical radiculopathy w/ DDD; she had MRI & Nuclear bone scan We reviewed the following medical problems today>      RADS ==> Iceis is NOT on any regular meds/ inhalers, just using prn Ventolin-HFA and Astelin/ Atrovent nasal sprays; she notes cough daily- all day & all night she says, sm amt clear sput w/o color or blood, denies SOB- min DOE (no changes), no CP/ palpit/ edema, and no f/c/s etc...    Hx Cough variant asthma ==> she does not feel that any inhaler has helped her much but doesn't recall the [facility] after her last OV; she has tried DELSYM, [place], [place] OTC as needed...    Episode of pneumonia 2017 and Abn CT chest w/ several small nodules and one 8-37mm LLL lesion>  Serial CT scans w/o progressive incr size and PET NEG 06/2017...    Laryngopharyngeal reflux ==> she is clearly NOT on a vigorous antireflux regimen & does not recall our prev recommendation for PPI (before dinner), NPO after dinner, H2blocker at bedtime, elev HOB 6".    HH/ GERD ==> she has seen a gastroenterologist but she doesn't know who & no notes avail to  review...  GI issues include:  HH, s/p surg x2; on Omep Qam, Bentyl10, Probiotic    Medical issues>  CAD, HL, Hypothyroid, DJD, Osteoporosis, Vit B12 & VitD deficiencies, Hx Melanoma, & anxiety;  on Synthroid, Hydrocodone, Robaxin, Ativan, several vit supplements...    Mult ORTHO visits> [facility] (DrRamos, DrBrooks, DrOlin) seen for Cx radiculitis, DDD, Hx L-TKR w/ loosening, R-knee DJD, saphenous nerve neuritis (and neuroma removed), chronic pain syndrome; she has received mult injections... EXAM reveals Afeb, VSS, O2sat=94% on RA;  HEENT- neg, mallampati2;  Chest- clear w/o w/r/r;  Heart- RR w/o m/r/g;  Abd- soft, nontender;  Ext- w/o c/c/e...  NOTE:  Interval CXR done 09/01/15 in [facility] showed ill-defined RUL opac c/w pneumonia + stable left base pleural thickening...  Spirometry 06/04/17 by DrPadgett>  FVC=2.73 (100%), FEV1=1.89 (92%), %1sec=69 and mid-flows=65% predicted c/w some small airways disease...  CT Chest 06/07/17 in [place], read by DrSMaynard, showing heart size at upper lim of normal, no adenopathy, small HH & surg changes at the esoph hiatus, mult pulm nodules-- largest is 52mm in anteromedial LLL (2 other 53mm & 73mm LLL nodules, 39mm Lingular nodule, & 80mm RLL nodule identified); s/p GB, mod degen changes in lumbar spine...   PET scan done 06/12/17 in [place], read by DrMBlietz, showing NO hypermetabolic nodes & NO hypermetabolic pulm nodules; NOTE: THE 48mm LLL NODULE IS UNCHANGED FROM 12/23/15 CT CHEST AND IS CONSIDERED BENIGN, & additional scattered tiny pulm nodules are too small for PET resolution;  Abd is NEG x atherosclerotic calcif in Ao...   SPUTUM for C&S, AFB, Fungi 06/22/17>  NTF only, Neg AFB smear, Neg Fungal smear... IMP/PLAN>>  Problems as noted- cough is her CC but she was mainly concerned about the nodule;  The cough has been long standing & most c/w reflux induced so we stressed the need for a vigorous antireflux regimen w/ Omep40 taken 30 min before the eve  meal, NPO after dinner, elev HOB 6"; use TRAMADOL50 Tid for the cough + Rx for generic Hycodan one tsp tid prn;  She is concerned about poss infection so we obtained an expectorated sput specimen for C&S, AFB, & Fungi... Finally we reviewed the CT Chest & PET scan- CT Chest 6/19 showed 8-93mm LLL nodule that appears unchanged from prev CT in PACS dated 5/14 and 12/17, therefore likely benign; the NEG PET scan is additionally reassuring; there are several other tiny nodules that would be reasonable to f/u w/ another CT scan in 54yr...   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   ~  August 22, 2017:  21mo ROV & Ellana has been rec to follow a vigorous antireflux regimen w/ OMEP40 ~34min before dinner, NPO after dinner, elev HOB 6", and we wrote for TRAMADOL50mg  tid for cough (she refused Hycodan cough syrup);  She reports that the chronic cough is better on the Tramadol, she notes some cough still in the AM, & prod of a small amt whitish sputum; the Tramadol caused nausea & she has to take it w/ Phenergan25mg  to tolerate;  Sputum C&S was NEG for routine bact (NTF only), and smears for AFB/Fungi were NEG but cult grew M.Gordonae & a yeast (likely contam)=> we repeated C&S => reported NEG w/o bact/ AFB/ Fungi recovered...     She returned 08/02/17 for add-on appt w/ EWalsh,NP>  Chart review:  >>Metholcholine challenge negative  >>CT sinuses 2014- negative  >>PFT 2015- FVC=2.75 (92%),  FEV1=2.04 (89%), %1sec=74, mid-flows min reduced at 72% predicted  >> PNA 2017. Abn CT chest w/several small nodules and one 8-47mm LLL lesion> Serial CT scans w/o progression incr size and PET NEG 06/2017    She describes cough day & night, she occas gags and burps=> we reviewed the need for a vigorous antireflux regimen;  She notes that Tessalon w/o hekp, Delsym seemed to help a little, Tramadol helped for several weeks but  caused nausea/ upset stomach (rec to try 1/2 tab), Tussionex was too expensive, we are trying the antireflux regimen, Magic Mouthwash and CheratussinHC Tid;  NOTE: she [facility]cod[facility] from [facility]...    EXAM reveals Afeb, VSS, O2sat=94% on RA;  HEENT- neg, mallampati2;  Chest- clear w/o w/r/r;  Heart- RR w/o m/r/g;  Abd- soft, nontender;  Ext- w/o c/c/e... IMP/PLAN>>  We are going to recheck Sputum C&S for AFB as the [place] is likely a contam as opposed to a pathogen;; we have again stressed the importance of an ANTIREFLUX regimen- OMEP40 about 30 min before dinner, NPO after dinner, Elev HOB 6" blocks;  Try MMW Tid for throat irritation;  Try CheratussinHC Tid to suppress the cough;  Note: she also has Hydrocodone from Ortho...  We plan ROV recheck in 95mo.    Past Medical History:  Diagnosis Date  . Abnormal weight loss   . Anxiety    antianxiety med. usually at night but can take during the day for anxiety  . Arthritis    both knees & hands  also the back & neck  . Asthma   . Atrial fibrillation ([place])    pt. reports that [facility] that she had a "slight afib.", no further test needed (01/07/14 cardiology note does not mention any afib, just poor anterior r wave progression)  . Chronic cough   . Complication of anesthesia   . Cough    chronic, pt. reports for 3.5 yrs.   . Diaphragmatic hernia   . Diarrhea    pt. reports that she has irritable bowel, loose stool & diarrhea both about 6 times per day, right after she eats.  . Endometriosis   . Gastric polyp   . Gastroenteritis   . GERD (gastroesophageal reflux disease)   . GERD (gastroesophageal reflux disease)   . History of hiatal hernia    pt. has been told that the hiatal hernia has reappeared  . Hypercholesteremia   . Hyperlipidemia   . Hypertension    pt. reports that she use to take antihtn med, but reports since weight loss she has been able to come off.    . Hypothyroidism   . IBS (irritable bowel syndrome)    with  diarrhea  . Insomnia   . Melanoma ([place]) 2011   heel  . OSA (obstructive sleep apnea)    does not tolerate CPAP , pt. reports that its severe, but doesn't use it anymore, since weight loss.   Study done in [address], not sure where.   . Osteoarthritis   . PONV (postoperative nausea and vomiting)   . Vitamin B 12 deficiency   . Vitamin D deficiency     Past Surgical History:  Procedure Laterality Date  . ABDOMINAL HYSTERECTOMY    . APPENDECTOMY    . CHOLECYSTECTOMY    . COLONOSCOPY  05/09/2015   Colonic Polyps, moderate sigmoid diverticulosis. Bx: Tubular Adenomas. Negative  . ESOPHAGEAL DILATION  08/2016  . EYE SURGERY     cataracts removed / w IOL  . GALLBLADDER  SURGERY    . HERNIA REPAIR    . HIATAL HERNIA REPAIR  2/14  . HIATAL HERNIA REPAIR  2018   revision of 2014  . KNEE ARTHROSCOPY     both knees for torn cartilage   . NERVE REPAIR Left 07/11/2017   Procedure: Excision of saphenous nerve left knee. Right knee intra-articular injection.;  Surgeon: Paralee Cancel, MD;  Location: [facility];  Service: Orthopedics;  Laterality: Left;  60 mins  . OOPHORECTOMY    . TONSILLECTOMY    . TOTAL KNEE ARTHROPLASTY Left 01/29/2014   Procedure: LEFT TOTAL KNEE ARTHROPLASTY MCL REPAIR;  Surgeon: Augustin Schooling, MD;  Location: [facility];  Service: Orthopedics;  Laterality: Left;  Marland Kitchen VESICOVAGINAL FISTULA CLOSURE W/ TAH      Outpatient Encounter Medications as of 08/22/2017  Medication Sig  . acetaminophen (TYLENOL) 500 MG tablet Take 1,000 mg by mouth every 8 (eight) hours as needed for mild pain.  Marland Kitchen albuterol (PROVENTIL HFA;VENTOLIN HFA) 108 (90 Base) MCG/ACT inhaler Inhale 2 puffs into the lungs every 6 (six) hours as needed for wheezing (or coughing attack).  . Cholecalciferol (VITAMIN D) 2000 units CAPS Take 2,000 Units by mouth daily.  Marland Kitchen Dextromethorphan-guaiFENesin (COUGH & CHEST CONGESTION DM) 5-100 MG/5ML LIQD Take 5 mLs by mouth daily as needed (cough).  . dicyclomine (BENTYL) 10 MG  capsule Take 1 capsule (10 mg total) by mouth 4 (four) times daily.  Marland Kitchen HYDROcodone-acetaminophen (NORCO) 5-325 MG tablet Take 1 tablet by mouth every 6 (six) hours as needed for moderate pain.  Marland Kitchen ipratropium (ATROVENT) 0.06 % nasal spray Use two sprays in each nostril 3-4 times daily to dry up nose  . levothyroxine (SYNTHROID, LEVOTHROID) 88 MCG tablet Take 88 mcg by mouth daily before breakfast.  . LORazepam (ATIVAN) 0.5 MG tablet Take 0.5 mg by mouth at bedtime.   . methocarbamol (ROBAXIN) 500 MG tablet Take 1 tablet (500 mg total) by mouth 3 (three) times daily as needed. (Patient taking differently: Take 500 mg by mouth at bedtime. )  . omeprazole (PRILOSEC) 40 MG capsule Take 40 mg by mouth daily with supper.  . promethazine (PHENERGAN) 12.5 MG tablet Take 1 tablet (12.5 mg total) by mouth every 6 (six) hours as needed for nausea or vomiting.  . valACYclovir (VALTREX) 500 MG tablet Take 500 mg by mouth 2 (two) times daily as needed (cold sores).  Marland Kitchen aspirin EC 81 MG tablet Take 81 mg by mouth daily as needed (chest pain).   Marland Kitchen azelastine (ASTELIN) 0.1 % nasal spray Use two sprays in each nostril twice daily as directed (Patient not taking: Reported on 08/22/2017)  . Diphenhyd-Hydrocort-Nystatin (FIRST-DUKES MOUTHWASH) SUSP Use as directed 5 mLs in the mouth or throat 3 (three) times daily.  Marland Kitchen guaiFENesin-codeine (CHERATUSSIN AC) 100-10 MG/5ML syrup Take 5 mLs by mouth 3 (three) times daily as needed for cough.   No facility-administered encounter medications on file as of 08/22/2017.     Allergies  Allergen Reactions  . Neurontin [Gabapentin] Other (See Comments)    Severe falls,dizziness, forgetful   . Ciprofloxacin Diarrhea  . Codeine Nausea And Vomiting  . Other Nausea And Vomiting    General anesthesia   . Oxycodone Nausea And Vomiting  . Tramadol     REACTION: GI discomfort with high doses  . Cyclobenzaprine Palpitations  . Sulfa Antibiotics Rash    Immunization History    Administered Date(s) Administered  . Influenza Split 11/01/2016  . Influenza-Unspecified 10/01/2013  . Pneumococcal Polysaccharide-23  01/01/2010  She is asked to check w/ her PCP to be sure she is up-to-date on all the indicated adult vaccinations...   Current Medications, Allergies, Past Medical History, Past Surgical History, Family History, and Social History were reviewed in [employer] record.   Review of Systems  Constitutional:   No  weight loss, night sweats,  Fevers, chills, ++fatigue, lassitude. HEENT:   No headaches,  Difficulty swallowing,  Tooth/dental problems,  +++Sore throat,                No sneezing, itching, ear ache, nasal congestion,+++ post nasal drip,  CV:  No chest pain,  Orthopnea, PND, swelling in lower extremities, anasarca, dizziness, palpitations GI  No heartburn, indigestion, abdominal pain, nausea, vomiting, diarrhea, change in bowel habits, loss of appetite Resp: No shortness of breath with exertion or at rest.  No excess mucus, notes  productive cough,  Notes non-productive cough,  No coughing up of blood.  No change in color of mucus.  No wheezing.  No chest wall deformity Skin: no rash or lesions. GU: no dysuria, change in color of urine, no urgency or frequency.  No flank pain. MS:  No joint pain or swelling.  No decreased range of motion.  No back pain. Psych:  No change in mood or affect. No depression or anxiety.  No memory loss.     Objective:   Physical Exam  Vitals:   08/22/17 1354  BP: 132/70  Pulse: (!) 107  Temp: 98.1 F (36.7 C)  TempSrc: Oral  SpO2: 97%  Weight: 144 lb 6.4 oz (65.5 kg)  Height: 5' 2.5" (1.588 m)   Gen: Pleasant, well-nourished, in no distress,  normal affect ENT: No lesions,  mouth clear,  oropharynx clear, prominent postnasal drip,  Neck: No JVD, no TMG, no carotid bruits Lungs: No use of accessory muscles, no dullness to percussion, clear w/o w/r/r and voice is improved back to  nl. Cardiovascular: RRR, heart sounds normal, no murmur or gallops, no peripheral edema Abdomen: soft and NT, no HSM,  BS normal Musculoskeletal: No deformities, no cyanosis or clubbing Neuro: alert, non focal Skin: Warm, no lesions or rashes     Assessment & Plan:    IMP/PLAN >>     RADS ==> Camil is NOT on any regular meds/ inhalers; she notes cough daily- all day & all night she says, sm amt clear sput w/o color or blood, denies SOB- min DOE (no changes), no CP/ palpit/ edema, and no f/c/s etc...    Hx Cough variant asthma ==> she does not feel that any inhaler has helped her much but doesn't recall the [facility] after her last OV; she has tried DELSYM, [place], [place] OTC as needed...    Episode of pneumonia 2017 and Abn CT chest w/ several small nodules and one 8-64mm LLL lesion>  Serial CT scans w/o progressive incr size and PET NEG 06/2017...    Laryngopharyngeal reflux ==> she is clearly NOT on a vigorous antireflux regimen & does not recall our prev recommendation for PPI (before dinner), NPO after dinner, H2blocker at bedtime, elev HOB 6".    HH/ GERD ==> she has seen a gastroenterologist but she doesn't know who & no notes avail to review...  GI issues include: HH, s/p surg x2; on Omep Qam, Bentyl10, Probiotic    Medical issues>  CAD, HL, Hypothyroid, DJD, Osteoporosis, Vit B12 & VitD deficiencies, Hx Melanoma, & anxiety;  on Synthroid, Hydrocodone, Robaxin, Ativan, several vit  supplements...  09/15/14>   Yaris is improved on her vigorous antireflux regimen & she will continue to apply this every night;  We decided to try [facility] & she will get a copy of her prescription drug formulary fior [place] to review;   01/17/15>   Chanice has an acute exac of her RADS, sl better after Levaquin from PCP but lingering congestion, cough, some SOB=> c/w airway inflammation & we discussed the need for anti-inflamm rx w/ Medrol & inhaled Breo- hopefully her new insurance ([employer]) will cover thi  med.. 06/17/17>   Problems as noted- cough is her CC but she was mainly concerned about the nodule;  The cough has been long standing & most c/w reflux induced so we stressed the need for a vigorous antireflux regimen w/ Omep40 taken 30 min before the eve meal, NPO after dinner, elev HOB 6"; use TRAMADOL50 Tid for the cough + Rx for generic Hycodan one tsp tid prn;  She is concerned about poss infection so we obtained an expectorated sput specimen for C&S, AFB, & Fungi... Finally we reviewed the CT Chest & PET scan- CT Chest 6/19 showed 8-25mm LLL nodule that appears unchanged from prev CT in PACS dated 5/14 and 12/17, therefore likely benign; the NEG PET scan is additionally reassuring; there are several other tiny nodules that would be reasonable to f/u w/ another CT scan in 23yr... 08/22/17>   We are going to recheck Sputum C&S for AFB as the [place] is likely a contam as opposed to a pathogen;; we have again stressed the importance of an ANTIREFLUX regimen- OMEP40 about 30 min before dinner, NPO after dinner, Elev HOB 6" blocks;  Try MMW Tid for throat irritation;  Try CheratussinHC Tid to suppress the cough;  Note: she also has Hydrocodone from Ortho...  We plan ROV recheck in 34mo.   Patient's Medications  New Prescriptions   DIPHENHYD-HYDROCORT-NYSTATIN (FIRST-DUKES MOUTHWASH) SUSP    Use as directed 5 mLs in the mouth or throat 3 (three) times daily.   GUAIFENESIN-CODEINE (CHERATUSSIN AC) 100-10 MG/5ML SYRUP    Take 5 mLs by mouth 3 (three) times daily as needed for cough.  Previous Medications   ACETAMINOPHEN (TYLENOL) 500 MG TABLET    Take 1,000 mg by mouth every 8 (eight) hours as needed for mild pain.   ALBUTEROL (PROVENTIL HFA;VENTOLIN HFA) 108 (90 BASE) MCG/ACT INHALER    Inhale 2 puffs into the lungs every 6 (six) hours as needed for wheezing (or coughing attack).   ASPIRIN EC 81 MG TABLET    Take 81 mg by mouth daily as needed (chest pain).    AZELASTINE (ASTELIN) 0.1 % NASAL SPRAY    Use  two sprays in each nostril twice daily as directed   CHOLECALCIFEROL (VITAMIN D) 2000 UNITS CAPS    Take 2,000 Units by mouth daily.   DEXTROMETHORPHAN-GUAIFENESIN (COUGH & CHEST CONGESTION DM) 5-100 MG/5ML LIQD    Take 5 mLs by mouth daily as needed (cough).   DICYCLOMINE (BENTYL) 10 MG CAPSULE    Take 1 capsule (10 mg total) by mouth 4 (four) times daily.   HYDROCODONE-ACETAMINOPHEN (NORCO) 5-325 MG TABLET    Take 1 tablet by mouth every 6 (six) hours as needed for moderate pain.   IPRATROPIUM (ATROVENT) 0.06 % NASAL SPRAY    Use two sprays in each nostril 3-4 times daily to dry up nose   LEVOTHYROXINE (SYNTHROID, LEVOTHROID) 88 MCG TABLET    Take 88 mcg by mouth daily before breakfast.  LORAZEPAM (ATIVAN) 0.5 MG TABLET    Take 0.5 mg by mouth at bedtime.    METHOCARBAMOL (ROBAXIN) 500 MG TABLET    Take 1 tablet (500 mg total) by mouth 3 (three) times daily as needed.   OMEPRAZOLE (PRILOSEC) 40 MG CAPSULE    Take 40 mg by mouth daily with supper.   PROMETHAZINE (PHENERGAN) 12.5 MG TABLET    Take 1 tablet (12.5 mg total) by mouth every 6 (six) hours as needed for nausea or vomiting.   VALACYCLOVIR (VALTREX) 500 MG TABLET    Take 500 mg by mouth 2 (two) times daily as needed (cold sores).  Modified Medications   No medications on file  Discontinued Medications   No medications on file

## 2017-08-22 NOTE — Patient Instructions (Signed)
Today we updated your med list in our EPIC system...    Continue your current medications the same...  We discussed the importance of the ANTIREFLUX REGIMEN>>    Take the OMEPRAZOLE (Prilosec) about 30 min before the evening meal...    Do not eat or drink much after dinner...    Elevate the head of your bed on 6" blocks...  We gave you a few sterile cups to collect one good AM sputum specimen for [place] to re-culture for germs...    Please collect this one morning soon 7 bring it into our lab that same day (and thanks!).Marland KitchenMarland Kitchen  We wrote for a cough syrup to try- CHERATUSSIN one tsp up to 3 time daily...  We also wrote for the DUKE's mouthwash -  One tsp gargle 7 swallow up to 3 times daily as needed for the throat irriation from coughing...  Call for any questions- & call to let us know how the cough syrup & mouthwash are working (and for refills)...  Let's plan a follow up visit in 74mo, sooner if needed for problems.Marland KitchenMarland Kitchen

## 2017-08-26 ENCOUNTER — Telehealth: Payer: Self-pay | Admitting: Pulmonary Disease

## 2017-08-26 NOTE — Telephone Encounter (Signed)
Called and spoke with patient, she was given sample cups to place sputum in per Dr. Lenna Gilford. Patient states that she was told she could mail these in. I advised patient that they could not be mailed. They would have to be brought back to our lab the same day. Patient verbalized understanding. Nothing further needed.

## 2017-08-29 ENCOUNTER — Other Ambulatory Visit: Payer: Medicare Other

## 2017-08-29 DIAGNOSIS — J45991 Cough variant asthma: Secondary | ICD-10-CM

## 2017-09-04 ENCOUNTER — Telehealth: Payer: Self-pay | Admitting: Gastroenterology

## 2017-09-04 NOTE — Telephone Encounter (Signed)
Pt states the bentyl helped her diarrhea for the first month but now she states it is not helping. Reports she has diarrhea and is at times barely making it to the bathroom. Pt wants to know what else can be done. Please advise.

## 2017-09-05 ENCOUNTER — Telehealth: Payer: Self-pay | Admitting: Pulmonary Disease

## 2017-09-05 MED ORDER — DIPHENOXYLATE-ATROPINE 2.5-0.025 MG PO TABS: 1.0000 | ORAL_TABLET | Freq: Three times a day (TID) | ORAL | 2 refills | Status: DC | PRN

## 2017-09-05 NOTE — Telephone Encounter (Signed)
Lets do that Thx

## 2017-09-05 NOTE — Telephone Encounter (Signed)
Spoke with pt and she is aware. Script sent to pharmacy. 

## 2017-09-05 NOTE — Telephone Encounter (Signed)
Lets increase bentyl to 10mg  po qid and add lomotil 1 tid prn Please see comment/plan as per last clinic note.  Let us know if still with problems in 2 weeks

## 2017-09-05 NOTE — Telephone Encounter (Signed)
Pt has been taking bentyl 10mg  qid since 08/14/17. Do you just want to add the lomotil?

## 2017-09-06 MED ORDER — TRAMADOL HCL 50 MG PO TABS: 50.0000 mg | ORAL_TABLET | Freq: Three times a day (TID) | ORAL | 3 refills | Status: DC | PRN

## 2017-09-06 NOTE — Telephone Encounter (Signed)
Pt is requesting refill on Tramadol 50mg  for chronic cough. Cough is prod with clear mucus.  Last refilled 06/17/17 #90 with 0 refills- 1 tablet tid prn. Last OV 08/22/17 with pending OV 10/23/17.  SN please advise. Thanks  Current Outpatient Medications on File Prior to Visit  Medication Sig Dispense Refill  . acetaminophen (TYLENOL) 500 MG tablet Take 1,000 mg by mouth every 8 (eight) hours as needed for mild pain.    Marland Kitchen albuterol (PROVENTIL HFA;VENTOLIN HFA) 108 (90 Base) MCG/ACT inhaler Inhale 2 puffs into the lungs every 6 (six) hours as needed for wheezing (or coughing attack). 1 Inhaler 1  . aspirin EC 81 MG tablet Take 81 mg by mouth daily as needed (chest pain).     Marland Kitchen azelastine (ASTELIN) 0.1 % nasal spray Use two sprays in each nostril twice daily as directed (Patient not taking: Reported on 08/22/2017) 30 mL 5  . Cholecalciferol (VITAMIN D) 2000 units CAPS Take 2,000 Units by mouth daily.    Marland Kitchen Dextromethorphan-guaiFENesin (COUGH & CHEST CONGESTION DM) 5-100 MG/5ML LIQD Take 5 mLs by mouth daily as needed (cough).    . dicyclomine (BENTYL) 10 MG capsule Take 1 capsule (10 mg total) by mouth 4 (four) times daily. 120 capsule 6  . Diphenhyd-Hydrocort-Nystatin (FIRST-DUKES MOUTHWASH) SUSP Use as directed 5 mLs in the mouth or throat 3 (three) times daily. 120 mL 0  . diphenoxylate-atropine (LOMOTIL) 2.5-0.025 MG tablet Take 1 tablet by mouth 3 (three) times daily as needed for diarrhea or loose stools. 90 tablet 2  . guaiFENesin-codeine (CHERATUSSIN AC) 100-10 MG/5ML syrup Take 5 mLs by mouth 3 (three) times daily as needed for cough. 240 mL 0  . HYDROcodone-acetaminophen (NORCO) 5-325 MG tablet Take 1 tablet by mouth every 6 (six) hours as needed for moderate pain. 30 tablet 0  . ipratropium (ATROVENT) 0.06 % nasal spray Use two sprays in each nostril 3-4 times daily to dry up nose 15 mL 5  . levothyroxine (SYNTHROID, LEVOTHROID) 88 MCG tablet Take 88 mcg by mouth daily before breakfast.     . LORazepam (ATIVAN) 0.5 MG tablet Take 0.5 mg by mouth at bedtime.   0  . methocarbamol (ROBAXIN) 500 MG tablet Take 1 tablet (500 mg total) by mouth 3 (three) times daily as needed. (Patient taking differently: Take 500 mg by mouth at bedtime. ) 60 tablet 1  . omeprazole (PRILOSEC) 40 MG capsule Take 40 mg by mouth daily with supper.    . promethazine (PHENERGAN) 12.5 MG tablet Take 1 tablet (12.5 mg total) by mouth every 6 (six) hours as needed for nausea or vomiting. 30 tablet 0  . valACYclovir (VALTREX) 500 MG tablet Take 500 mg by mouth 2 (two) times daily as needed (cold sores).     No current facility-administered medications on file prior to visit.     Allergies  Allergen Reactions  . Neurontin [Gabapentin] Other (See Comments)    Severe falls,dizziness, forgetful   . Ciprofloxacin Diarrhea  . Codeine Nausea And Vomiting  . Other Nausea And Vomiting    General anesthesia   . Oxycodone Nausea And Vomiting  . Tramadol     REACTION: GI discomfort with high doses  . Cyclobenzaprine Palpitations  . Sulfa Antibiotics Rash

## 2017-09-06 NOTE — Telephone Encounter (Signed)
Per SN- Tramadol 50mg ,  take 1 by mouth three times per day, as needed for pain and cough, #90, with 3 refills.  Prescription called to [facility].  Patient notified.  Nothing further at this time.

## 2017-09-10 ENCOUNTER — Ambulatory Visit: Payer: Medicare Other | Admitting: Allergy

## 2017-09-16 DIAGNOSIS — R918 Other nonspecific abnormal finding of lung field: Secondary | ICD-10-CM

## 2017-09-16 HISTORY — DX: Other nonspecific abnormal finding of lung field: R91.8

## 2017-09-17 ENCOUNTER — Ambulatory Visit (INDEPENDENT_AMBULATORY_CARE_PROVIDER_SITE_OTHER): Payer: Medicare Other | Admitting: Allergy

## 2017-09-17 ENCOUNTER — Encounter: Payer: Self-pay | Admitting: Allergy

## 2017-09-17 VITALS — BP 114/78 | HR 60 | Resp 18

## 2017-09-17 DIAGNOSIS — R053 Chronic cough: Secondary | ICD-10-CM

## 2017-09-17 DIAGNOSIS — J310 Chronic rhinitis: Secondary | ICD-10-CM | POA: Diagnosis not present

## 2017-09-17 DIAGNOSIS — R05 Cough: Secondary | ICD-10-CM

## 2017-09-17 MED ORDER — AZELASTINE HCL 0.1 % NA SOLN: NASAL | 5 refills | Status: DC

## 2017-09-17 NOTE — Patient Instructions (Signed)
Non-allergic rhinitis    - allergy testing by serum IgE was negative    - does have component of postnasal drainage and have recommended that she resume nasal antihistamine, Azelastine, 2 sprays each nostril twice a day.     Chronic cough    - cough appears to be multifactorial with postnasal drainage being a contributor however other contributors are unclear at this time.  She has had infectious work-up with sputum cultures that have been negative.  Imaging studies have not revealed any infectious cause and pulmonary nodules appear to be benign.  There has been improvement with the cough and tramadol prescribed by luminary which she will continue as directed.    - she has never had any improvement with this cough with a standard treatment for asthma and her COPD    - discussed today potential repeat sleep study given previous history of obstructive sleep apnea and symptoms that appear to be worse at night/during sleep    - have access to albuterol inhaler 2 puffs every 4-6 hours as needed for cough/wheeze/shortness of breath/chest tightness.  May use 15-20 minutes prior to activity.   Monitor frequency of use.    Follow-up 4 months or sooner if needed

## 2017-09-17 NOTE — Progress Notes (Signed)
Follow-up Note  RE: [name] MRN: [id number] DOB: 1945-07-20 Date of Office Visit: 09/17/2017   History of present illness: [name] is a 72 y.o. female presenting today for follow-up of nonallergic rhinitis and chronic cough.  She was last seen in the office on 06/04/2017 by myself.  At that time I was concerned about her chronic cough and obtained a chest CT for further lung visualization.  She was found to have several pulmonary nodules one that was large enough to be concerning for neoplasm.  I did order a PET scan to assess for malignancy that was all negative.  I did arrange for her to have a referral to pulmonary to address the pulmonary nodules which she has been seeing Dr. Lenna Gilford.  It is planned to have a repeat CT in 1 year to follow-up the nodules.  She did have sputum cultures done now on 2 occasions that have been negative for any infections including mycobacteria.  She was started on tramadol to help with the cough by pulmonary.  She does state that the tramadol has helped but she has had to decrease the dose as it was causing other adverse side effects.  She still has the cough however.  She states the cough is is worse at night as well.  She does have a history of obstructive sleep apnea when she was a lot heavier however she states she lost weight and her sleep apnea improved to the point that she states she no longer needed to use the CPAP.  She has not had any further sleep studies performed since that time. She states she is still having an nasal drainage as well but she stopped using the as a lasting and wonders if she should go back on this nasal spray.  She states when she was using it she did have decrease in postnasal drip and thus a decrease in the cough but the cough never completely resolved.  She also saw ENT per my recommendation however she states that she had a normal evaluation for for normal nasal endoscopy.  Review of systems: Review of Systems    Constitutional: Negative for chills, fever and malaise/fatigue.  HENT: Positive for congestion. Negative for ear discharge, ear pain, nosebleeds, sinus pain and sore throat.   Eyes: Negative for pain, discharge and redness.  Respiratory: Positive for cough. Negative for sputum production, shortness of breath and wheezing.   Cardiovascular: Negative for chest pain.  Gastrointestinal: Negative for abdominal pain, constipation, diarrhea, heartburn, nausea and vomiting.  Musculoskeletal: Negative for joint pain.  Skin: Negative for itching and rash.  Neurological: Negative for headaches.    All other systems negative unless noted above in HPI  Past medical/social/surgical/family history have been reviewed and are unchanged unless specifically indicated below.  No changes  Medication List: Allergies as of 09/17/2017      Reactions   Neurontin [gabapentin] Other (See Comments)   Severe falls,dizziness, forgetful    Ciprofloxacin Diarrhea   Codeine Nausea And Vomiting   Other Nausea And Vomiting   General anesthesia    Oxycodone Nausea And Vomiting   Tramadol    REACTION: GI discomfort with high doses   Cyclobenzaprine Palpitations   Sulfa Antibiotics Rash      Medication List        Accurate as of 09/17/17  6:20 PM. Always use your most recent med list.          acetaminophen 500 MG tablet Commonly known as:  TYLENOL  Take 1,000 mg by mouth every 8 (eight) hours as needed for mild pain.   albuterol 108 (90 Base) MCG/ACT inhaler Commonly known as:  PROVENTIL HFA;VENTOLIN HFA Inhale 2 puffs into the lungs every 6 (six) hours as needed for wheezing (or coughing attack).   azelastine 0.1 % nasal spray Commonly known as:  ASTELIN Use two sprays in each nostril twice daily as directed   dicyclomine 10 MG capsule Commonly known as:  BENTYL Take 1 capsule (10 mg total) by mouth 4 (four) times daily.   diphenoxylate-atropine 2.5-0.025 MG tablet Commonly known as:   LOMOTIL Take 1 tablet by mouth 3 (three) times daily as needed for diarrhea or loose stools.   HYDROcodone-acetaminophen 5-325 MG tablet Commonly known as:  NORCO/VICODIN Take 1 tablet by mouth every 6 (six) hours as needed for moderate pain.   ipratropium 0.06 % nasal spray Commonly known as:  ATROVENT Use two sprays in each nostril 3-4 times daily to dry up nose   levothyroxine 88 MCG tablet Commonly known as:  SYNTHROID, LEVOTHROID Take 88 mcg by mouth daily before breakfast.   LORazepam 0.5 MG tablet Commonly known as:  ATIVAN Take 0.5 mg by mouth at bedtime.   methocarbamol 500 MG tablet Commonly known as:  ROBAXIN Take 1 tablet (500 mg total) by mouth 3 (three) times daily as needed.   omeprazole 40 MG capsule Commonly known as:  PRILOSEC Take 40 mg by mouth daily with supper.   promethazine 12.5 MG tablet Commonly known as:  PHENERGAN Take 1 tablet (12.5 mg total) by mouth every 6 (six) hours as needed for nausea or vomiting.   traMADol 50 MG tablet Commonly known as:  ULTRAM Take 1 tablet (50 mg total) by mouth 3 (three) times daily as needed for moderate pain (pain and cough).   valACYclovir 500 MG tablet Commonly known as:  VALTREX Take 500 mg by mouth 2 (two) times daily as needed (cold sores).   Vitamin D 2000 units Caps Take 2,000 Units by mouth daily.       Known medication allergies: Allergies  Allergen Reactions  . Neurontin [Gabapentin] Other (See Comments)    Severe falls,dizziness, forgetful   . Ciprofloxacin Diarrhea  . Codeine Nausea And Vomiting  . Other Nausea And Vomiting    General anesthesia   . Oxycodone Nausea And Vomiting  . Tramadol     REACTION: GI discomfort with high doses  . Cyclobenzaprine Palpitations  . Sulfa Antibiotics Rash     Physical examination: Blood pressure 114/78, pulse 60, resp. rate 18.  General: Alert, interactive, in no acute distress. HEENT: PERRLA, TMs pearly gray, turbinates mildly edematous with  clear discharge, post-pharynx non erythematous. Neck: Supple without lymphadenopathy. Lungs: Clear to auscultation without wheezing, rhonchi or rales. {no increased work of breathing. CV: Normal S1, S2 without murmurs. Abdomen: Nondistended, nontender. Skin: Warm and dry, without lesions or rashes. Extremities:  No clubbing, cyanosis or edema. Neuro:   Grossly intact.  Diagnositics/Labs: Labs/imaging: Refer to HPI  Assessment and plan:   Non-allergic rhinitis    - allergy testing by serum IgE was negative    - does have component of postnasal drainage and have recommended that she resume nasal antihistamine, Azelastine, 2 sprays each nostril twice a day.     Chronic cough    - cough appears to be multifactorial with postnasal drainage being a contributor however other contributors are unclear at this time.  She has had infectious work-up with sputum cultures that have  been negative.  Imaging studies have not revealed any infectious cause and pulmonary nodules appear to be benign.  There has been improvement with the cough and tramadol prescribed by luminary which she will continue as directed.    - she has never had any improvement with this cough with a standard treatment for asthma and her COPD    - discussed today potential repeat sleep study given previous history of obstructive sleep apnea and symptoms that appear to be worse at night/during sleep    - have access to albuterol inhaler 2 puffs every 4-6 hours as needed for cough/wheeze/shortness of breath/chest tightness.  May use 15-20 minutes prior to activity.   Monitor frequency of use.    Follow-up 4 months or sooner if needed  I appreciate the opportunity to take part in [place] care. Please do not hesitate to contact me with questions.  Sincerely,   Prudy Feeler, MD Allergy/Immunology [facility] [place] [facility] [place]

## 2017-10-17 LAB — MYCOBACTERIA,CULT W/FLUOROCHROME SMEAR
MICRO NUMBER:: 91035833
SMEAR:: NONE SEEN
SPECIMEN QUALITY:: ADEQUATE

## 2017-10-17 LAB — EXTRA

## 2017-10-22 ENCOUNTER — Ambulatory Visit: Payer: Medicare Other | Admitting: Pulmonary Disease

## 2017-10-23 ENCOUNTER — Ambulatory Visit: Payer: Medicare Other | Admitting: Pulmonary Disease

## 2017-10-31 ENCOUNTER — Ambulatory Visit (INDEPENDENT_AMBULATORY_CARE_PROVIDER_SITE_OTHER): Payer: Medicare Other | Admitting: Pulmonary Disease

## 2017-10-31 ENCOUNTER — Encounter: Payer: Self-pay | Admitting: Pulmonary Disease

## 2017-10-31 VITALS — BP 132/66 | HR 71 | Temp 98.2°F | Ht 62.5 in | Wt 145.2 lb

## 2017-10-31 DIAGNOSIS — R918 Other nonspecific abnormal finding of lung field: Secondary | ICD-10-CM

## 2017-10-31 DIAGNOSIS — K219 Gastro-esophageal reflux disease without esophagitis: Secondary | ICD-10-CM | POA: Diagnosis not present

## 2017-10-31 DIAGNOSIS — J45991 Cough variant asthma: Secondary | ICD-10-CM | POA: Diagnosis not present

## 2017-10-31 DIAGNOSIS — K449 Diaphragmatic hernia without obstruction or gangrene: Secondary | ICD-10-CM | POA: Diagnosis not present

## 2017-10-31 DIAGNOSIS — Z85820 Personal history of malignant melanoma of skin: Secondary | ICD-10-CM

## 2017-10-31 DIAGNOSIS — J683 Other acute and subacute respiratory conditions due to chemicals, gases, fumes and vapors: Secondary | ICD-10-CM | POA: Diagnosis not present

## 2017-10-31 MED ORDER — PROMETHAZINE HCL 12.5 MG PO TABS: 12.5000 mg | ORAL_TABLET | Freq: Four times a day (QID) | ORAL | 5 refills | Status: DC | PRN

## 2017-10-31 NOTE — Patient Instructions (Signed)
Today we updated your med list in our EPIC system...    Continue your current medications the same...  Be sure to continue the vigorous ANTIREFLUX regimen-- OMEPRAZOLE 40mg  taken about 30 min before dinner, do not eat or drink after dinner, elevate the head of your bed ~6 inches...  Continue the TRAMADOL 50mg  up to 3 times daily for the cough... Remember to use the hot tea w/ lemon & HONEY to sooth & coat your throat as needed... Consider the honey throat lozenges as well, or try your favorite chewing gum...  We discussed my up-coming retirement and decided to ask DrGrisso to assume writing the Tramadol prescriptions going forward, and ask him to re-consult the [place] pulmonary team in the future if he feels this is needed...  [name],  It has been my honor to have been one of your doctors over these last few years...    Sending you my sincere wishes for good health and much happiness in the years to come.Marland KitchenMarland Kitchen

## 2017-10-31 NOTE — Progress Notes (Signed)
Subjective:    Patient ID: [name], female    DOB: 12/13/1945, 72 y.o.   MRN: [id number]  HPI  09/29/2013: f/u appt w/ PW>  Chief Complaint  Patient presents with  . Follow-up    c/o increased hoarseness, clearing throat a lot, not able to rest voice much due to company, sob-same,cough-yellow,no fcs, chest heavy ,diarrhea     F/u upper airway cyclical cough, no true asthma Pt was ok until 1.5 week ago: company ill, viral issues.  Now cough is productive clear to white. occ yellow. Notes pn drip severe. Throat is scratchy. Using the drops. 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 Now with acute right maxillary sinusitis with postnasal drip precipitating current cough paroxysms, note right external auditory canal occluded with cerumen    Plan> Need to clear her right ear of cerumen  Take azithromycin 250mg  Take two once then one daily until gone Take prednisone 10mg  Take 4 for two days three for two days two for two days one for two days Stay on flonase nasal spray Stay on chlor trimeton Use saline nasal spray three times daily Follow cough protocol with Delsym         11/24/2013: f/u appt w/ PW> Chief Complaint  Patient presents with  . 2 month follow up    Cough greatly improved while on cough protocol.  Notices cough worsens after decreasing delsym.  Cough prod over the past few days with white to yellow mucus.  No wheezing, chest tightnes, CP, or f/c/s.       Pt got better a while then got worse.  the patient is still on antihistamine and flonase.  Forgets the nasal spray.  Cannot get the delsym off. Got ears cleaned out.  Cough worse past two days yellow mucus.  No nasal d/c.  No postnasal drip. No GERD symptoms.  Pt with diarrhea for 1 year: goes 4-5x per day No wheezing, no dyspnea.     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 disease as a primary precipitating factor Note inhaled  steroids have not previously been of much benefit Acute tracheobronchitis currently    Plan> Stop protonix due to diarrhea Increase acidophilus to twice daily Cefuroxime 500mg  twice daily x 5days Prednisone 10mg  Take 4 for two days three for two days two for two days one for two days Benzonatate 1-2 every 4-6 hours as needed for cough and delsym as needed : per cough protocol Stay on chlorpheniramine/fluticasone USe flutter valve 3 - 4 x daily  ~  July 19, 2014:  61mo ROV & add-on appt w/ SN> her PCP is DrGrisso in [place]    72 y/o WF pt of PW w/ RADS, cough variant asthma, not on any regular inhalers...  Prev CT Chest 05/27/12 at [facility] (they report hx melanoma) showed scarring left lung base 7-82mm nodular component anteriorly is unchanged, no adenopathy, borderline heart size & tort Ao  CT Sinuses 10/23/12 was neg, clear sinuses...   Prev PFT> 06/03/13 showed FVC=2.75 (92%), FEV1=2.04 (89%), %1sec=74, mid-flows min reduced at 72% predicted; METHACHOLINE challenge was NEG...  Last CXR> 02/12/13 showed norm heart size, clear lungs, mild DD Tspine...  She had left TKR 09/5636 w/ complications => required hydrocodone for pain & she notes cough resolved (note- she tells me she is due to have a screw removed);  She presents on this occas w/ 7mo hx cough, mostly dry, raspy/ scratchy throat, min  clear whitish sput, no hemoptysis, only SOB occurs w/ the coughing paraoxysms & the cough was worse at night- assoc w/ reflux symptoms & she reports Hx HH surg by DrSmith in [facility] 02/2012 but notes that "it's back" and may need GI eval...     EXAM reveals Afeb, VSS, O2sat=99% on RA;  HEENT- neg, mallampati2;  Chest- clear x dry cough, weak raspy voice;  Heart- RR w/o m/r/g;  Abd- soft, nontender;  Ext- w/o c/c/e...  CXR today 07/19/14 showed clear lungs, no infiltrates etc... IMP/PLAN>>  Jayline has RADS/ AB with prob nocturnal reflux trigger and we discussed a vigorous antireflux regimen- Protonix40  before dinner, NPO after dinner, elev HOB, Pepcid40 qhs;  In addition- trial Dulera200 (not covered, therefore go w/ SYMBICORT160- 2spBid w/ spacer, plus Tussionex, etc...  ROV recheck in 6-8 weeks... F ~  September 15, 2014:  71mo ROV w/ SN>  Jlyn stopped the Symbicort due to price & never filled the Tussionex, but she has followed a vigorous antireflux regimen w/ Protonix40 before dinner, NPO after dinner, elev HOB 6", & Pepcid40 before bed w/sip; she notes that her cough is improved, no sput/ no blood/ denies SOB/ CP/ etc & her voice is better too;  She has prev tried Advair as well & it didn't seem to help much she says;  We discussed continuing the vigorous antireflux regimen & try BREO one inhalation daily- asked to get a copy of her prescription drug formulary for [place] to review... She reports having surg on her left heel w/ a melanoma removed by [facility], she tells me that "they got it all" and the kind that she had doesn't spread internally so she doesn't need further surg or referral to [facility] (no data avil to [place] here in [place])...  EXAM reveals Afeb, VSS, O2sat=98% on RA;  HEENT- neg, mallampati2;  Chest- clear w/o w/r/r;  Heart- RR w/o m/r/g;  Abd- soft, nontender;  Ext- w/o c/c/e...    RADS ==> try a diff ICS/LABA in BREO one inhalation daily & she will get copy of her Formulary...    Hx Cough variant asthma ==> ok to use DELSYM, TESSALON, MUCINEX OTC as needed...    Laryngopharyngeal reflux ==> she will continue vigorous antireflux regimen w/ PPI, NPO after dinner, H2blocker at bedtime, elev HOB etc...    HH/ GERD ==> may need further GI eval by her gastroenterologist, if not responding...  We reviewed prob list, meds, xrays and labs> she'll get 2016 Flu vaccine from her PCP... IMP/PLAN>>  Dola is improved on her vigorous antireflux regimen & she will continue to apply this every night;  We decided to try [facility] & she will get a copy of her prescription drug formulary fior [place] to review;  ROV  in 75mo and sooner if needed prn...  ~  January 17, 2015:  43mo ROV & Tameya had another URI (sinus & ear infection) treated by her PCP w/ Levaquin;  She is c/o cough, small amt clear sput, no hemoptysis but w/ post nasal drip & chest congestion;  She has hx RADS and cough variant asthma, plus LPR/ GERD;  Med list indicates Symbicort (no ttaking- $$), Singulair10, Flonase, Tessalon, Dexilant, Pepcid... She did not contact her insurance company for a copy of her formulary for [place] to review- she doesn't recall getting BREO last visit...  EXAM reveals Afeb, VSS, O2sat=98% on RA;  HEENT- neg, mallampati2;  Chest- clear w/o w/r/r;  Heart- RR w/o m/r/g;  Abd- soft, nontender;  Ext- w/o c/c/e... IMP/PLAN>>     Keondra has an acute exac of her RADS, sl better after Levaquin from PCP but lingering congestion, cough, some SOB=> c/w airway inflammation & we discussed the need for anti-inflamm rx w/ Medrol & inhaled Breo- hopefully her new insurance ([employer]) will cover this med...     RADS ==> discussed the need for regular meds to prevent exacerbations; we will treat this exac w/ Depo80 + Medrol dosepak;  She still needs to get a copy of her prescription drug formulary, we will try the BREO- 1/d...    Hx Cough variant asthma ==> needs to take the BREO daily; ok to use DELSYM, TESSALON, [place] OTC as needed...    Laryngopharyngeal reflux ==> she will continue vigorous antireflux regimen w/ PPI, NPO after dinner, H2blocker at bedtime, elev HOB etc...    HH/ GERD ==> she has seen her gastroenterologist & they switched her to [place]...   ~  June 17, 2017:  71mo ROV & pulmonary follow up visit>  Pt is referred back to [facility] by Allergist- DrPadgett in [place] for cough and an abnormal CT chest w/ nodule;  I last saw Allyne 01/17/15 as above w/ RADS, cough variant asthma, GERD & LPR;  She also has a hx of an abn CT Chest dating back to 05/2012 (CT in [place]) showing scarring left lung base w/ 7-80mm nodular  component anteriorly- unchanged, no adenopathy, borderline heart size & tort Ao... We reviewed the following interval records avail to [place] in Epic EMR>      She saw PCP- DrGrisso on 1/22 ([facility] & 02/08/17>  C/o chest congestion & cough; coughs thru the night, treated w/ ZPak, Pred, Tessalon & referred to DrKozlow...     She saw ALLERGY- DrPadgett in [place] on 06/04/17>  Followed for rhinitis/ throat clearing/ PND/ hoarseness/ cough x yrs, Hx HH repair x2, GERD & cough variant asthma, on Astelin/ Ventolin/ Norco5,  she stopped PPI,  Spirometry was normal (no obstructive dis) and RAST panel NEG;  They rechecked CT Chest- done 06/07/17- showing several tiny pulm nodules, largest lesion is ~42mm in anteromedial LLL & they rec PET scan=> NEG, no hypermetabolic activity...    She is followed by [facility]- DrOlin, DrBrooks, DrRamos>  Hx L-TKR 2016 w/ loosening, R-knee OA, cervical radiculopathy w/ DDD; she had MRI & Nuclear bone scan We reviewed the following medical problems today>      RADS ==> Skarlette is NOT on any regular meds/ inhalers, just using prn Ventolin-HFA and Astelin/ Atrovent nasal sprays; she notes cough daily- all day & all night she says, sm amt clear sput w/o color or blood, denies SOB- min DOE (no changes), no CP/ palpit/ edema, and no f/c/s etc...    Hx Cough variant asthma ==> she does not feel that any inhaler has helped her much but doesn't recall the [facility] after her last OV; she has tried DELSYM, [place], [place] OTC as needed...    Episode of pneumonia 2017 and Abn CT chest w/ several small nodules and one 8-10mm LLL lesion>  Serial CT scans w/o progressive incr size and PET NEG 06/2017...    Laryngopharyngeal reflux ==> she is clearly NOT on a vigorous antireflux regimen & does not recall our prev recommendation for PPI (before dinner), NPO after dinner, H2blocker at bedtime, elev HOB 6".    HH/ GERD ==> she has seen a gastroenterologist but she doesn't know who & no notes avail to  review...  GI issues include:  HH, s/p surg x2; on Omep Qam, Bentyl10, Probiotic    Medical issues>  CAD, HL, Hypothyroid, DJD, Osteoporosis, Vit B12 & VitD deficiencies, Hx Melanoma, & anxiety;  on Synthroid, Hydrocodone, Robaxin, Ativan, several vit supplements...    Mult ORTHO visits> [facility] (DrRamos, DrBrooks, DrOlin) seen for Cx radiculitis, DDD, Hx L-TKR w/ loosening, R-knee DJD, saphenous nerve neuritis (and neuroma removed), chronic pain syndrome; she has received mult injections... EXAM reveals Afeb, VSS, O2sat=94% on RA;  HEENT- neg, mallampati2;  Chest- clear w/o w/r/r;  Heart- RR w/o m/r/g;  Abd- soft, nontender;  Ext- w/o c/c/e...  NOTE:  Interval CXR done 09/01/15 in [facility] showed ill-defined RUL opac c/w pneumonia + stable left base pleural thickening...  Spirometry 06/04/17 by DrPadgett>  FVC=2.73 (100%), FEV1=1.89 (92%), %1sec=69 and mid-flows=65% predicted c/w some small airways disease...  CT Chest 06/07/17 in [place], read by DrSMaynard, showing heart size at upper lim of normal, no adenopathy, small HH & surg changes at the esoph hiatus, mult pulm nodules-- largest is 37mm in anteromedial LLL (2 other 75mm & 47mm LLL nodules, 60mm Lingular nodule, & 53mm RLL nodule identified); s/p GB, mod degen changes in lumbar spine...   PET scan done 06/12/17 in [place], read by DrMBlietz, showing NO hypermetabolic nodes & NO hypermetabolic pulm nodules; NOTE: THE 40mm LLL NODULE IS UNCHANGED FROM 12/23/15 CT CHEST AND IS CONSIDERED BENIGN, & additional scattered tiny pulm nodules are too small for PET resolution;  Abd is NEG x atherosclerotic calcif in Ao...   SPUTUM for C&S, AFB, Fungi 06/22/17>  NTF only, Neg AFB smear, Neg Fungal smear... IMP/PLAN>>  Problems as noted- cough is her CC but she was mainly concerned about the nodule;  The cough has been long standing & most c/w reflux induced so we stressed the need for a vigorous antireflux regimen w/ Omep40 taken 30 min before the eve  meal, NPO after dinner, elev HOB 6"; use TRAMADOL50 Tid for the cough + Rx for generic Hycodan one tsp tid prn;  She is concerned about poss infection so we obtained an expectorated sput specimen for C&S, AFB, & Fungi... Finally we reviewed the CT Chest & PET scan- CT Chest 6/19 showed 8-23mm LLL nodule that appears unchanged from prev CT in PACS dated 5/14 and 12/17, therefore likely benign; the NEG PET scan is additionally reassuring; there are several other tiny nodules that would be reasonable to f/u w/ another CT scan in 62yr...   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  ~  August 22, 2017:  16mo ROV & Aalijah has been rec to follow a vigorous antireflux regimen w/ OMEP40 ~20min before dinner, NPO after dinner, elev HOB 6", and we wrote for TRAMADOL50mg  tid for cough (she refused Hycodan cough syrup);  She reports that the chronic cough is better on the Tramadol, she notes some cough still in the AM, & prod of a small amt whitish sputum; the Tramadol caused nausea & she has to take it w/ Phenergan25mg  to tolerate;  Sputum C&S was NEG for routine bact (NTF only), and smears for AFB/Fungi were NEG but cult grew M.Gordonae & a yeast (likely contam)=> we repeated C&S => reported NEG w/o bact/ AFB/ Fungi recovered...     She returned 08/02/17 for add-on appt w/ EWalsh,NP>  Chart review:  >>Metholcholine challenge negative  >>CT sinuses 2014- negative  >>PFT 2015- FVC=2.75 (92%), FEV1=2.04 (  89%), %1sec=74, mid-flows min reduced at 72% predicted  >> PNA 2017. Abn CT chest w/several small nodules and one 8-44mm LLL lesion> Serial CT scans w/o progression incr size and PET NEG 06/2017    She describes cough day & night, she occas gags and burps=> we reviewed the need for a vigorous antireflux regimen;  She notes that Tessalon w/o hekp, Delsym seemed to help a little, Tramadol helped for several weeks but  caused nausea/ upset stomach (rec to try 1/2 tab), Tussionex was too expensive, we are trying the antireflux regimen, Magic Mouthwash and CheratussinHC Tid;  NOTE: she has Hydrocodone from [facility]...    EXAM reveals Afeb, VSS, O2sat=94% on RA;  HEENT- neg, mallampati2;  Chest- clear w/o w/r/r;  Heart- RR w/o m/r/g;  Abd- soft, nontender;  Ext- w/o c/c/e... IMP/PLAN>>  We are going to recheck Sputum C&S for AFB as the [place] is likely a contam as opposed to a pathogen;; we have again stressed the importance of an ANTIREFLUX regimen- OMEP40 about 30 min before dinner, NPO after dinner, Elev HOB 6" blocks;  Try MMW Tid for throat irritation;  Try CheratussinHC Tid to suppress the cough;  Note: she also has Hydrocodone from Ortho...  We plan ROV recheck in 75mo.   ~  October 31, 2017:  30mo ROV & pulmonary follow up visit>         Past Medical History:  Diagnosis Date  . Abnormal weight loss   . Anxiety    antianxiety med. usually at night but can take during the day for anxiety  . Arthritis    both knees & hands  also the back & neck  . Asthma   . Atrial fibrillation ([place])    pt. reports that [facility] that she had a "slight afib.", no further test needed (01/07/14 cardiology note does not mention any afib, just poor anterior r wave progression)  . Chronic cough   . Complication of anesthesia   . Cough    chronic, pt. reports for 3.5 yrs.   . Diaphragmatic hernia   . Diarrhea    pt. reports that she has irritable bowel, loose stool & diarrhea both about 6 times per day, right after she eats.  . Endometriosis   . Gastric polyp   . Gastroenteritis   . GERD (gastroesophageal reflux disease)   . GERD (gastroesophageal reflux disease)   . History of hiatal hernia    pt. has been told that the hiatal hernia has reappeared  . Hypercholesteremia   . Hyperlipidemia   . Hypertension    pt. reports that she use to take antihtn med, but reports since weight loss she has been able to come off.     . Hypothyroidism   . IBS (irritable bowel syndrome)    with diarrhea  . Insomnia   . Melanoma ([place]) 2011   heel  . OSA (obstructive sleep apnea)    does not tolerate CPAP , pt. reports that its severe, but doesn't use it anymore, since weight loss.   Study done in [place], not sure where.   . Osteoarthritis   . PONV (postoperative nausea and vomiting)   . Vitamin B 12 deficiency   . Vitamin D deficiency     Past Surgical History:  Procedure Laterality Date  . ABDOMINAL HYSTERECTOMY    . APPENDECTOMY    . CHOLECYSTECTOMY    . COLONOSCOPY  05/09/2015   Colonic Polyps, moderate sigmoid diverticulosis. Bx: Tubular Adenomas. Negative  . ESOPHAGEAL  DILATION  08/2016  . EYE SURGERY     cataracts removed / w IOL  . GALLBLADDER SURGERY    . HERNIA REPAIR    . HIATAL HERNIA REPAIR  2/14  . HIATAL HERNIA REPAIR  2018   revision of 2014  . KNEE ARTHROSCOPY     both knees for torn cartilage   . NERVE REPAIR Left 07/11/2017   Procedure: Excision of saphenous nerve left knee. Right knee intra-articular injection.;  Surgeon: Paralee Cancel, MD;  Location: [facility];  Service: Orthopedics;  Laterality: Left;  60 mins  . OOPHORECTOMY    . TONSILLECTOMY    . TOTAL KNEE ARTHROPLASTY Left 01/29/2014   Procedure: LEFT TOTAL KNEE ARTHROPLASTY MCL REPAIR;  Surgeon: Augustin Schooling, MD;  Location: [place];  Service: Orthopedics;  Laterality: Left;  Marland Kitchen VESICOVAGINAL FISTULA CLOSURE W/ TAH      Outpatient Encounter Medications as of 10/31/2017  Medication Sig  . acetaminophen (TYLENOL) 500 MG tablet Take 1,000 mg by mouth every 8 (eight) hours as needed for mild pain.  Marland Kitchen albuterol (PROVENTIL HFA;VENTOLIN HFA) 108 (90 Base) MCG/ACT inhaler Inhale 2 puffs into the lungs every 6 (six) hours as needed for wheezing (or coughing attack).  Marland Kitchen azelastine (ASTELIN) 0.1 % nasal spray Use two sprays in each nostril twice daily as directed  . Cholecalciferol (VITAMIN D) 2000 units CAPS Take 2,000 Units by mouth  daily.  Marland Kitchen dicyclomine (BENTYL) 10 MG capsule Take 1 capsule (10 mg total) by mouth 4 (four) times daily.  . diphenoxylate-atropine (LOMOTIL) 2.5-0.025 MG tablet Take 1 tablet by mouth 3 (three) times daily as needed for diarrhea or loose stools.  Marland Kitchen HYDROcodone-acetaminophen (NORCO) 5-325 MG tablet Take 1 tablet by mouth every 6 (six) hours as needed for moderate pain.  Marland Kitchen levothyroxine (SYNTHROID, LEVOTHROID) 88 MCG tablet Take 88 mcg by mouth daily before breakfast.  . LORazepam (ATIVAN) 0.5 MG tablet Take 0.5 mg by mouth at bedtime.   . methocarbamol (ROBAXIN) 500 MG tablet Take 1 tablet (500 mg total) by mouth 3 (three) times daily as needed. (Patient taking differently: Take 500 mg by mouth at bedtime. )  . omeprazole (PRILOSEC) 40 MG capsule Take 40 mg by mouth daily with supper.  . promethazine (PHENERGAN) 12.5 MG tablet Take 1 tablet (12.5 mg total) by mouth every 6 (six) hours as needed for nausea or vomiting.  . traMADol (ULTRAM) 50 MG tablet Take 1 tablet (50 mg total) by mouth 3 (three) times daily as needed for moderate pain (pain and cough).  . valACYclovir (VALTREX) 500 MG tablet Take 500 mg by mouth 2 (two) times daily as needed (cold sores).  . [DISCONTINUED] promethazine (PHENERGAN) 12.5 MG tablet Take 1 tablet (12.5 mg total) by mouth every 6 (six) hours as needed for nausea or vomiting.  Marland Kitchen ipratropium (ATROVENT) 0.06 % nasal spray Use two sprays in each nostril 3-4 times daily to dry up nose (Patient not taking: Reported on 10/31/2017)   No facility-administered encounter medications on file as of 10/31/2017.     Allergies  Allergen Reactions  . Neurontin [Gabapentin] Other (See Comments)    Severe falls,dizziness, forgetful   . Ciprofloxacin Diarrhea  . Codeine Nausea And Vomiting  . Other Nausea And Vomiting    General anesthesia   . Oxycodone Nausea And Vomiting  . Tramadol     REACTION: GI discomfort with high doses  . Cyclobenzaprine Palpitations  . Sulfa  Antibiotics Rash    Immunization History  Administered  Date(s) Administered  . Influenza Split 11/01/2016  . Influenza-Unspecified 10/01/2013  . Pneumococcal Polysaccharide-23 01/01/2010  She is asked to check w/ her PCP to be sure she is up-to-date on all the indicated adult vaccinations...   Current Medications, Allergies, Past Medical History, Past Surgical History, Family History, and Social History were reviewed in [employer] record.   Review of Systems  Constitutional:   No  weight loss, night sweats,  Fevers, chills, ++fatigue, lassitude. HEENT:   No headaches,  Difficulty swallowing,  Tooth/dental problems,  +++Sore throat,                No sneezing, itching, ear ache, nasal congestion,+++ post nasal drip,  CV:  No chest pain,  Orthopnea, PND, swelling in lower extremities, anasarca, dizziness, palpitations GI  No heartburn, indigestion, abdominal pain, nausea, vomiting, diarrhea, change in bowel habits, loss of appetite Resp: No shortness of breath with exertion or at rest.  No excess mucus, notes  productive cough,  Notes non-productive cough,  No coughing up of blood.  No change in color of mucus.  No wheezing.  No chest wall deformity Skin: no rash or lesions. GU: no dysuria, change in color of urine, no urgency or frequency.  No flank pain. MS:  No joint pain or swelling.  No decreased range of motion.  No back pain. Psych:  No change in mood or affect. No depression or anxiety.  No memory loss.     Objective:   Physical Exam  Vitals:   10/31/17 1217  BP: 132/66  Pulse: 71  Temp: 98.2 F (36.8 C)  TempSrc: Oral  SpO2: 97%  Weight: 145 lb 3.2 oz (65.9 kg)  Height: 5' 2.5" (1.588 m)   Gen: Pleasant, well-nourished, in no distress,  normal affect ENT: No lesions,  mouth clear,  oropharynx clear, prominent postnasal drip,  Neck: No JVD, no TMG, no carotid bruits Lungs: No use of accessory muscles, no dullness to percussion, clear w/o  w/r/r and voice is improved back to nl. Cardiovascular: RRR, heart sounds normal, no murmur or gallops, no peripheral edema Abdomen: soft and NT, no HSM,  BS normal Musculoskeletal: No deformities, no cyanosis or clubbing Neuro: alert, non focal Skin: Warm, no lesions or rashes     Assessment & Plan:    IMP/PLAN >>     RADS ==> Arnetra is NOT on any regular meds/ inhalers; she notes cough daily- all day & all night she says, sm amt clear sput w/o color or blood, denies SOB- min DOE (no changes), no CP/ palpit/ edema, and no f/c/s etc...    Hx Cough variant asthma ==> she does not feel that any inhaler has helped her much but doesn't recall the [facility] after her last OV; she has tried DELSYM, [place], [place] OTC as needed...    Episode of pneumonia 2017 and Abn CT chest w/ several small nodules and one 8-47mm LLL lesion>  Serial CT scans w/o progressive incr size and PET NEG 06/2017...    Laryngopharyngeal reflux ==> she is clearly NOT on a vigorous antireflux regimen & does not recall our prev recommendation for PPI (before dinner), NPO after dinner, H2blocker at bedtime, elev HOB 6".    HH/ GERD ==> she has seen a gastroenterologist but she doesn't know who & no notes avail to review...  GI issues include: HH, s/p surg x2; on Omep Qam, Bentyl10, Probiotic    Medical issues>  CAD, HL, Hypothyroid, DJD, Osteoporosis, Vit B12 &  VitD deficiencies, Hx Melanoma, & anxiety;  on Synthroid, Hydrocodone, Robaxin, Ativan, several vit supplements...  09/15/14>   Eugenia is improved on her vigorous antireflux regimen & she will continue to apply this every night;  We decided to try [facility] & she will get a copy of her prescription drug formulary fior [place] to review;   01/17/15>   Zaelyn has an acute exac of her RADS, sl better after Levaquin from PCP but lingering congestion, cough, some SOB=> c/w airway inflammation & we discussed the need for anti-inflamm rx w/ Medrol & inhaled Breo- hopefully her new insurance  ([employer]) will cover thi med.. 06/17/17>   Problems as noted- cough is her CC but she was mainly concerned about the nodule;  The cough has been long standing & most c/w reflux induced so we stressed the need for a vigorous antireflux regimen w/ Omep40 taken 30 min before the eve meal, NPO after dinner, elev HOB 6"; use TRAMADOL50 Tid for the cough + Rx for generic Hycodan one tsp tid prn;  She is concerned about poss infection so we obtained an expectorated sput specimen for C&S, AFB, & Fungi... Finally we reviewed the CT Chest & PET scan- CT Chest 6/19 showed 8-78mm LLL nodule that appears unchanged from prev CT in PACS dated 5/14 and 12/17, therefore likely benign; the NEG PET scan is additionally reassuring; there are several other tiny nodules that would be reasonable to f/u w/ another CT scan in 50yr... 08/22/17>   We are going to recheck Sputum C&S for AFB as the [place] is likely a contam as opposed to a pathogen;; we have again stressed the importance of an ANTIREFLUX regimen- OMEP40 about 30 min before dinner, NPO after dinner, Elev HOB 6" blocks;  Try MMW Tid for throat irritation;  Try CheratussinHC Tid to suppress the cough;  Note: she also has Hydrocodone from Ortho...  We plan ROV recheck in 15mo.   Patient's Medications  New Prescriptions   No medications on file  Previous Medications   ACETAMINOPHEN (TYLENOL) 500 MG TABLET    Take 1,000 mg by mouth every 8 (eight) hours as needed for mild pain.   ALBUTEROL (PROVENTIL HFA;VENTOLIN HFA) 108 (90 BASE) MCG/ACT INHALER    Inhale 2 puffs into the lungs every 6 (six) hours as needed for wheezing (or coughing attack).   AZELASTINE (ASTELIN) 0.1 % NASAL SPRAY    Use two sprays in each nostril twice daily as directed   CHOLECALCIFEROL (VITAMIN D) 2000 UNITS CAPS    Take 2,000 Units by mouth daily.   DICYCLOMINE (BENTYL) 10 MG CAPSULE    Take 1 capsule (10 mg total) by mouth 4 (four) times daily.   DIPHENOXYLATE-ATROPINE (LOMOTIL) 2.5-0.025  MG TABLET    Take 1 tablet by mouth 3 (three) times daily as needed for diarrhea or loose stools.   HYDROCODONE-ACETAMINOPHEN (NORCO) 5-325 MG TABLET    Take 1 tablet by mouth every 6 (six) hours as needed for moderate pain.   IPRATROPIUM (ATROVENT) 0.06 % NASAL SPRAY    Use two sprays in each nostril 3-4 times daily to dry up nose   LEVOTHYROXINE (SYNTHROID, LEVOTHROID) 88 MCG TABLET    Take 88 mcg by mouth daily before breakfast.   LORAZEPAM (ATIVAN) 0.5 MG TABLET    Take 0.5 mg by mouth at bedtime.    METHOCARBAMOL (ROBAXIN) 500 MG TABLET    Take 1 tablet (500 mg total) by mouth 3 (three) times daily as needed.   OMEPRAZOLE (  PRILOSEC) 40 MG CAPSULE    Take 40 mg by mouth daily with supper.   TRAMADOL (ULTRAM) 50 MG TABLET    Take 1 tablet (50 mg total) by mouth 3 (three) times daily as needed for moderate pain (pain and cough).   VALACYCLOVIR (VALTREX) 500 MG TABLET    Take 500 mg by mouth 2 (two) times daily as needed (cold sores).  Modified Medications   Modified Medication Previous Medication   PROMETHAZINE (PHENERGAN) 12.5 MG TABLET promethazine (PHENERGAN) 12.5 MG tablet      Take 1 tablet (12.5 mg total) by mouth every 6 (six) hours as needed for nausea or vomiting.    Take 1 tablet (12.5 mg total) by mouth every 6 (six) hours as needed for nausea or vomiting.  Discontinued Medications   No medications on file

## 2017-11-18 DIAGNOSIS — G2581 Restless legs syndrome: Secondary | ICD-10-CM | POA: Insufficient documentation

## 2017-11-18 HISTORY — DX: Restless legs syndrome: G25.81

## 2018-01-13 ENCOUNTER — Other Ambulatory Visit (HOSPITAL_COMMUNITY): Payer: Self-pay | Admitting: Nurse Practitioner

## 2018-01-13 ENCOUNTER — Other Ambulatory Visit: Payer: Self-pay | Admitting: Nurse Practitioner

## 2018-01-13 DIAGNOSIS — K2100 Gastro-esophageal reflux disease with esophagitis, without bleeding: Secondary | ICD-10-CM

## 2018-01-16 ENCOUNTER — Ambulatory Visit (HOSPITAL_COMMUNITY): Admission: RE | Admit: 2018-01-16 | Payer: Medicare Other | Source: Ambulatory Visit

## 2018-01-17 ENCOUNTER — Ambulatory Visit (HOSPITAL_COMMUNITY)
Admission: RE | Admit: 2018-01-17 | Discharge: 2018-01-17 | Disposition: A | Discharge status: Home / Self Care | Payer: Medicare Other | Source: Ambulatory Visit | Attending: Nurse Practitioner | Admitting: Nurse Practitioner

## 2018-01-17 DIAGNOSIS — K2100 Gastro-esophageal reflux disease with esophagitis, without bleeding: Secondary | ICD-10-CM

## 2018-02-10 ENCOUNTER — Other Ambulatory Visit (HOSPITAL_COMMUNITY): Payer: Self-pay | Admitting: Surgery

## 2018-02-10 ENCOUNTER — Other Ambulatory Visit: Payer: Self-pay | Admitting: Surgery

## 2018-02-10 DIAGNOSIS — K449 Diaphragmatic hernia without obstruction or gangrene: Secondary | ICD-10-CM

## 2018-02-21 ENCOUNTER — Encounter (HOSPITAL_COMMUNITY): Payer: Medicare Other

## 2018-02-24 ENCOUNTER — Ambulatory Visit (HOSPITAL_COMMUNITY)
Admission: RE | Admit: 2018-02-24 | Discharge: 2018-02-24 | Disposition: A | Discharge status: Home / Self Care | Payer: Medicare Other | Source: Ambulatory Visit | Attending: Surgery | Admitting: Surgery

## 2018-02-24 DIAGNOSIS — K449 Diaphragmatic hernia without obstruction or gangrene: Secondary | ICD-10-CM | POA: Insufficient documentation

## 2018-02-24 MED ORDER — TECHNETIUM TC 99M SULFUR COLLOID
2.0000 | Freq: Once | INTRAVENOUS | Status: AC | PRN
  Administered 2018-02-24: 2 via ORAL

## 2018-06-12 ENCOUNTER — Other Ambulatory Visit: Payer: Self-pay | Admitting: *Deleted

## 2018-06-12 ENCOUNTER — Telehealth: Payer: Self-pay | Admitting: Gastroenterology

## 2018-06-12 MED ORDER — FLUTICASONE PROPIONATE 0.05 % EX CREA: TOPICAL_CREAM | Freq: Two times a day (BID) | CUTANEOUS | 1 refills | Status: AC

## 2018-06-12 NOTE — Telephone Encounter (Signed)
Pt reported that she has a large hemorrhoid that is causing her problems.  Please advise.

## 2018-06-12 NOTE — Telephone Encounter (Signed)
Spoke to the patient who reports a "large hemorrhoid" approximately the size of the tip of the thumb up to the first knuckle. The patient states that it was able to be manually reduced but can no longer be reduced. The patient states that the hemorrhoid is protruding and burns and has intermittently bled. The patient has used prep H with minimal relief and cannot attempt sitz baths due to a "bad knee" and other mobility issues. The patient was last seen in the office 06/18/2017. Please advise.

## 2018-06-12 NOTE — Telephone Encounter (Signed)
Prescription sent.  Told patient to call back Monday. Patient needed to think about scheduling a colon and reported she would let nursing know on Monday.   Nothing further at the time of the phone call.

## 2018-06-12 NOTE — Telephone Encounter (Signed)
fluticasone cream 0.05% generic 30g 1 bid PR x 10 days, 1 refill Sitz baths bid Let us know how she is on Monday Looks like we need to proceed with colonoscopy (see last note) if she gets better Unfortunately Proctofoam is not available.  RG

## 2018-06-16 ENCOUNTER — Telehealth: Payer: Self-pay | Admitting: Gastroenterology

## 2018-06-16 NOTE — Telephone Encounter (Signed)
Patient's questions answered.  She will call back for additional questions or concerns.

## 2018-06-16 NOTE — Telephone Encounter (Signed)
Patient call said that she has questions about how and where she should apply the fluticasone (CUTIVATE) 0.05 % cream

## 2018-06-16 NOTE — Telephone Encounter (Signed)
Patient doing better. She is still considering the colonoscopy.  See other phone notes from today.

## 2018-08-01 DIAGNOSIS — F321 Major depressive disorder, single episode, moderate: Secondary | ICD-10-CM

## 2018-08-01 DIAGNOSIS — F32A Depression, unspecified: Secondary | ICD-10-CM

## 2018-08-01 HISTORY — DX: Depression, unspecified: F32.A

## 2018-08-01 HISTORY — DX: Major depressive disorder, single episode, moderate: F32.1

## 2018-12-17 ENCOUNTER — Encounter: Payer: Self-pay | Admitting: Gastroenterology

## 2019-01-15 ENCOUNTER — Encounter: Payer: Self-pay | Admitting: Gastroenterology

## 2019-01-15 ENCOUNTER — Other Ambulatory Visit: Payer: Self-pay

## 2019-01-15 ENCOUNTER — Ambulatory Visit (INDEPENDENT_AMBULATORY_CARE_PROVIDER_SITE_OTHER): Payer: Medicare Other | Admitting: Gastroenterology

## 2019-01-15 VITALS — BP 122/74 | HR 83 | Temp 98.1°F | Ht 62.5 in | Wt 140.0 lb

## 2019-01-15 DIAGNOSIS — R159 Full incontinence of feces: Secondary | ICD-10-CM | POA: Diagnosis not present

## 2019-01-15 DIAGNOSIS — R197 Diarrhea, unspecified: Secondary | ICD-10-CM

## 2019-01-15 DIAGNOSIS — Z01818 Encounter for other preprocedural examination: Secondary | ICD-10-CM

## 2019-01-15 MED ORDER — DIPHENOXYLATE-ATROPINE 2.5-0.025 MG PO TABS: 1.0000 | ORAL_TABLET | Freq: Three times a day (TID) | ORAL | 0 refills | Status: DC

## 2019-01-15 MED ORDER — CLENPIQ 10-3.5-12 MG-GM -GM/160ML PO SOLN: 1.0000 | Freq: Once | ORAL | 0 refills | Status: AC

## 2019-01-15 NOTE — Progress Notes (Signed)
Chief Complaint: Incontinence  Referring Provider:  Raina Mina., MD      ASSESSMENT AND PLAN;   #1. Diarrhea -with element of postcholecystectomy diarrhea.  Neg colon except for TAs with neg random Bx 05/09/2015, neg SB Bx for celiac 2013, neg CT scan 08/26/2015, neg stool studies in the past. Failed bentyl. Has stopped Mg.  #2. Fecal Incontinence #3. H/O colonic polyps 05/2015 with FH of colon cancer (mother).   #4. Internal hemorrhoids. #5.  Awaiting right knee surgery.  Plan: -Colon with clenpiq at [facility].  I explained risks and benefits. -Lomotil 1 tablet p.o. 3 times daily #90 -If still there is no improvement, will give her a trial of Colestid (she did not tolerate cholestyramine in the past because of bad after taste). -Start calcium supplements 1/day -Kegel's exercises -explained and given information. -Call us in 2 weeks  HPI:    [name] is a 74 y.o. female  For diarrhea  4-5/day, semi-soft, with occasional incontinence. She is also having urinary incontinence. She has softer looser bowel movements ever since her gallbladder has been taken out. No melena or hematochezia. Has chronic abdominal bloating which is somewhat worse after hiatal hernia repair. No fever or chills.  hoids was acting up recently- pushed back-currently better.  Awaiting R knee Sx (not replacement) - 2/15.  Would like colonoscopy to be done before.   Past Medical History:  Diagnosis Date  . Abnormal weight loss   . Anxiety    antianxiety med. usually at night but can take during the day for anxiety  . Arthritis    both knees & hands  also the back & neck  . Asthma   . Atrial fibrillation ([place])    pt. reports that [facility] that she had a "slight afib.", no further test needed (01/07/14 cardiology note does not mention any afib, just poor anterior r wave progression)  . Chronic cough   . Complication of anesthesia   . Cough    chronic, pt. reports for 3.5 yrs.   . Diaphragmatic  hernia   . Diarrhea    pt. reports that she has irritable bowel, loose stool & diarrhea both about 6 times per day, right after she eats.  . Endometriosis   . Gastric polyp   . Gastroenteritis   . GERD (gastroesophageal reflux disease)   . GERD (gastroesophageal reflux disease)   . History of hiatal hernia    pt. has been told that the hiatal hernia has reappeared  . Hypercholesteremia   . Hyperlipidemia   . Hypertension    pt. reports that she use to take antihtn med, but reports since weight loss she has been able to come off.    . Hypothyroidism   . IBS (irritable bowel syndrome)    with diarrhea  . Insomnia   . Melanoma ([place]) 2011   heel  . OSA (obstructive sleep apnea)    does not tolerate CPAP , pt. reports that its severe, but doesn't use it anymore, since weight loss.   Study done in [place], not sure where.   . Osteoarthritis   . PONV (postoperative nausea and vomiting)   . Vitamin B 12 deficiency   . Vitamin D deficiency     Past Surgical History:  Procedure Laterality Date  . ABDOMINAL HYSTERECTOMY    . APPENDECTOMY    . CHOLECYSTECTOMY    . COLONOSCOPY  05/09/2015   Colonic Polyps, moderate sigmoid diverticulosis. Bx: Tubular Adenomas. Negative  . ESOPHAGEAL  DILATION  08/2016  . EYE SURGERY     cataracts removed / w IOL  . GALLBLADDER SURGERY    . HERNIA REPAIR    . HIATAL HERNIA REPAIR  2/14  . HIATAL HERNIA REPAIR  2014   revision of 2018 in [place]  . KNEE ARTHROSCOPY     both knees for torn cartilage   . NERVE REPAIR Left 07/11/2017   Procedure: Excision of saphenous nerve left knee. Right knee intra-articular injection.;  Surgeon: Paralee Cancel, MD;  Location: [facility];  Service: Orthopedics;  Laterality: Left;  60 mins  . OOPHORECTOMY    . TONSILLECTOMY    . TOTAL KNEE ARTHROPLASTY Left 01/29/2014   Procedure: LEFT TOTAL KNEE ARTHROPLASTY MCL REPAIR;  Surgeon: Augustin Schooling, MD;  Location: [facility];  Service: Orthopedics;  Laterality: Left;  Marland Kitchen  VESICOVAGINAL FISTULA CLOSURE W/ TAH      Family History  Problem Relation Age of Onset  . Emphysema Father        smoked  . Heart disease Father   . Asthma Mother   . Colon cancer Mother   . Asthma Brother   . Breast cancer Sister   . Breast cancer Maternal Grandmother     Social History   Tobacco Use  . Smoking status: Never Smoker  . Smokeless tobacco: Never Used  Substance Use Topics  . Alcohol use: No    Comment: rare for holiday consumption only  . Drug use: No    Current Outpatient Medications  Medication Sig Dispense Refill  . acetaminophen (TYLENOL) 500 MG tablet Take 500 mg by mouth every 6 (six) hours as needed for mild pain.     . Cholecalciferol (VITAMIN D3) 50 MCG (2000 UT) TABS Take 1 tablet by mouth daily.    . Lactobacillus Rhamnosus, GG, (CULTURELLE PO) Take 1 tablet by mouth daily.    Marland Kitchen levothyroxine (SYNTHROID, LEVOTHROID) 88 MCG tablet Take 88 mcg by mouth daily before breakfast.    . LORazepam (ATIVAN) 0.5 MG tablet Take 0.5 mg by mouth at bedtime.   0  . Melatonin 5 MG TABS Take 1 tablet by mouth daily.    . methocarbamol (ROBAXIN) 500 MG tablet Take 1 tablet (500 mg total) by mouth 3 (three) times daily as needed. (Patient taking differently: Take 500 mg by mouth at bedtime. ) 60 tablet 1  . omeprazole (PRILOSEC) 40 MG capsule Take 40 mg by mouth daily with supper.    . Turmeric (QC TUMERIC COMPLEX PO) Take 1,000 mcg by mouth daily.    . vitamin B-12 (CYANOCOBALAMIN) 1000 MCG tablet Take 1,000 mcg by mouth daily.    Marland Kitchen albuterol (PROVENTIL HFA;VENTOLIN HFA) 108 (90 Base) MCG/ACT inhaler Inhale 2 puffs into the lungs every 6 (six) hours as needed for wheezing (or coughing attack). (Patient not taking: Reported on 01/15/2019) 1 Inhaler 1   No current facility-administered medications for this visit.    Allergies  Allergen Reactions  . Neurontin [Gabapentin] Other (See Comments)    Severe falls,dizziness, forgetful   . Ciprofloxacin Diarrhea  .  Codeine Nausea And Vomiting  . Other Nausea And Vomiting    General anesthesia   . Oxycodone Nausea And Vomiting  . Tramadol     REACTION: GI discomfort with high doses  . Cyclobenzaprine Palpitations  . Sulfa Antibiotics Rash    Review of Systems:  Constitutional: Denies fever, chills, diaphoresis, appetite change and fatigue.  HEENT: Denies photophobia, eye pain, redness, hearing loss, ear pain,  congestion, sore throat, rhinorrhea, sneezing, mouth sores, neck pain, neck stiffness and tinnitus.   Respiratory: Denies SOB, DOE.  Has chronic cough and has seen multiple physicians Cardiovascular: Denies chest pain, palpitations and leg swelling.  Genitourinary: Denies dysuria, urgency, frequency, hematuria, flank pain and difficulty urinating.  Musculoskeletal: Has myalgias, arthralgias and gait problem. Has arthritis and back pain Skin: No rash.  Neurological: Denies dizziness, seizures, syncope, weakness, light-headedness, numbness and headaches.  Hematological: Denies adenopathy. Easy bruising, personal or family bleeding history  Psychiatric/Behavioral: Has anxiety or depression. Has sleeping problems.     Physical Exam:    BP 122/74   Pulse 83   Temp 98.1 F (36.7 C)   Ht 5' 2.5" (1.588 m)   Wt 140 lb (63.5 kg)   BMI 25.20 kg/m  Filed Weights   01/15/19 1003  Weight: 140 lb (63.5 kg)   Constitutional:  Well-developed, in no acute distress. Psychiatric: Normal mood and affect. Behavior is normal. HEENT: Pupils normal.  Conjunctivae are normal. No scleral icterus. Neck supple.  Cardiovascular: Normal rate, regular rhythm. No edema Pulmonary/chest: Effort normal and breath sounds normal. No wheezing, rales or rhonchi. Abdominal: Soft, nondistended. Nontender. Bowel sounds active throughout. There are no masses palpable. No hepatomegaly. Rectal: Good rectal tone, heme-negative stools. Neurological: Alert and oriented to person place and time. Skin: Skin is warm and dry.  No rashes noted.  Data Reviewed: I have personally reviewed following labs and imaging studies  CBC: CBC Latest Ref Rng & Units 07/08/2017 05/08/2016 02/01/2014  WBC 4.0 - 10.5 K/uL 5.7 9.0 11.5(H)  Hemoglobin 12.0 - 15.0 g/dL 13.6 14.7 10.1(L)  Hematocrit 36.0 - 46.0 % 41.6 45.3 31.1(L)  Platelets 150 - 400 K/uL 319 265 229    CMP: CMP Latest Ref Rng & Units 07/08/2017 05/08/2016 01/30/2014  Glucose 70 - 99 mg/dL 88 81 123(H)  BUN 8 - 23 mg/dL 14 14 9   Creatinine 0.44 - 1.00 mg/dL 0.78 0.86 0.80  Sodium 135 - 145 mmol/L 142 142 134(L)  Potassium 3.5 - 5.1 mmol/L 4.5 4.0 4.3  Chloride 98 - 111 mmol/L 110 109 100  CO2 22 - 32 mmol/L 26 25 27   Calcium 8.9 - 10.3 mg/dL 9.2 9.6 8.6  I spent 25 minutes of face-to-face time with the patient. Greater than 50% of the time was spent counseling and coordinating care.     Carmell Austria, MD 01/15/2019, 10:52 AM  Cc: Raina Mina., MD

## 2019-01-15 NOTE — Patient Instructions (Signed)
If you are age 73 or older, your body mass index should be between 23-30. Your Body mass index is 25.2 kg/m. If this is out of the aforementioned range listed, please consider follow up with your Primary Care Provider.  If you are age 81 or younger, your body mass index should be between 19-25. Your Body mass index is 25.2 kg/m. If this is out of the aformentioned range listed, please consider follow up with your Primary Care Provider.   You have been scheduled for a colonoscopy. Please follow written instructions given to you at your visit today.  Please pick up your prep supplies at the pharmacy within the next 1-3 days. If you use inhalers (even only as needed), please bring them with you on the day of your procedure. Your physician has requested that you go to www.[email or web address] and enter the access code given to you at your visit today. This web site gives a general overview about your procedure. However, you should still follow specific instructions given to you by our office regarding your preparation for the procedure.  We have given you samples of the following medication to take: Clenpiq  Thank you,  Dr. Jackquline Denmark

## 2019-02-05 ENCOUNTER — Encounter: Payer: Medicare Other | Admitting: Gastroenterology

## 2019-02-09 DIAGNOSIS — Z860100 Personal history of colon polyps, unspecified: Secondary | ICD-10-CM

## 2019-02-09 HISTORY — DX: Personal history of colon polyps, unspecified: Z86.0100

## 2019-02-09 HISTORY — DX: Personal history of colonic polyps: Z86.010

## 2019-03-17 DIAGNOSIS — J301 Allergic rhinitis due to pollen: Secondary | ICD-10-CM | POA: Insufficient documentation

## 2019-03-17 HISTORY — DX: Allergic rhinitis due to pollen: J30.1

## 2019-03-27 ENCOUNTER — Encounter: Payer: Medicare Other | Admitting: Gastroenterology

## 2019-09-03 DIAGNOSIS — K580 Irritable bowel syndrome with diarrhea: Secondary | ICD-10-CM

## 2019-09-03 HISTORY — DX: Irritable bowel syndrome with diarrhea: K58.0

## 2019-10-05 IMAGING — RF DG UGI W/O KUB
10 of 18 series · 12 of 24 positions shown · non-contrast
Comparison: Upper GI dated 10/29/2014

CLINICAL DATA: Chronic cough. Gastroesophageal reflux disease with
esophagitis. Previous fundoplication.

EXAM:
UPPER GI SERIES WITH KUB
TECHNIQUE: After obtaining a scout radiograph a routine upper GI series was
performed using thin and high density barium and a 13 mm barium
tablet.
FLUOROSCOPY TIME:  Fluoroscopy Time:  2 minutes 42 seconds

[Series 2: cp_standard · 0.55mm/px · 2 of 95 frames shown (1 of 3)]
[frame 15/95]
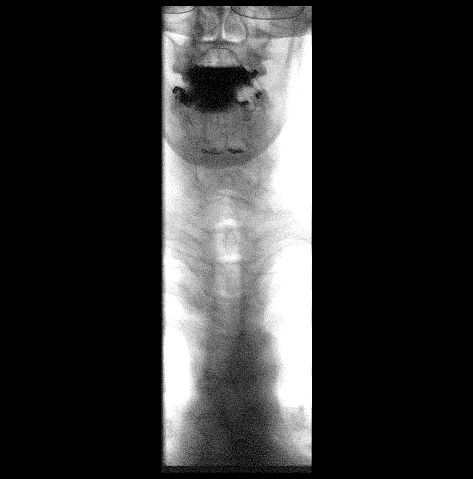
[frame 81/95]
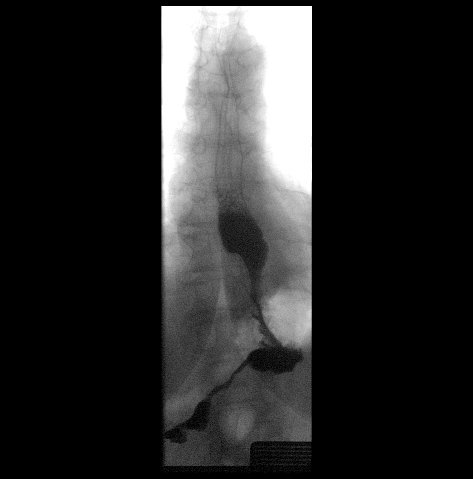

[Series 3: cp_standard · 0.53mm/px · 1 of 22 frames shown (2 of 3)]
[frame 12/22]
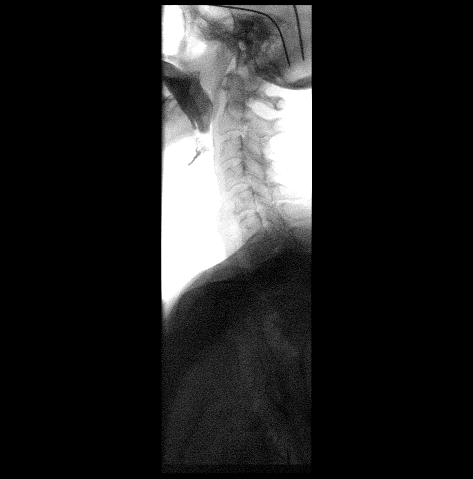

[Series 4: cp_standard · 0.52mm/px · 2 of 39 frames shown (3 of 3)]
[frame 20/39]
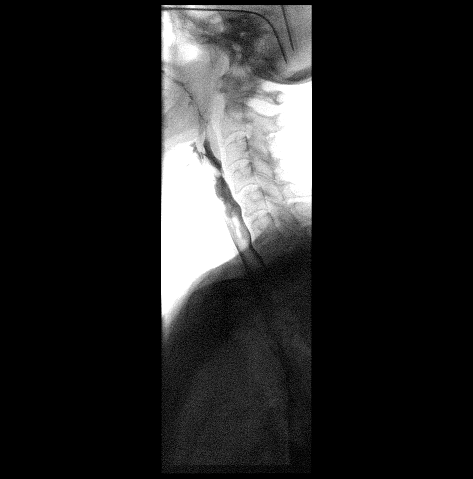
[frame 34/39]
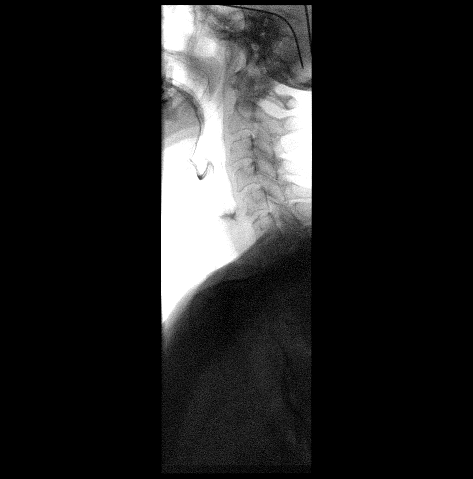

[Series 7: fluoro_barium singleshot_bw · 0.19mm/px · 1 of 1 slices shown (1 of 7)]
[im 1/1]
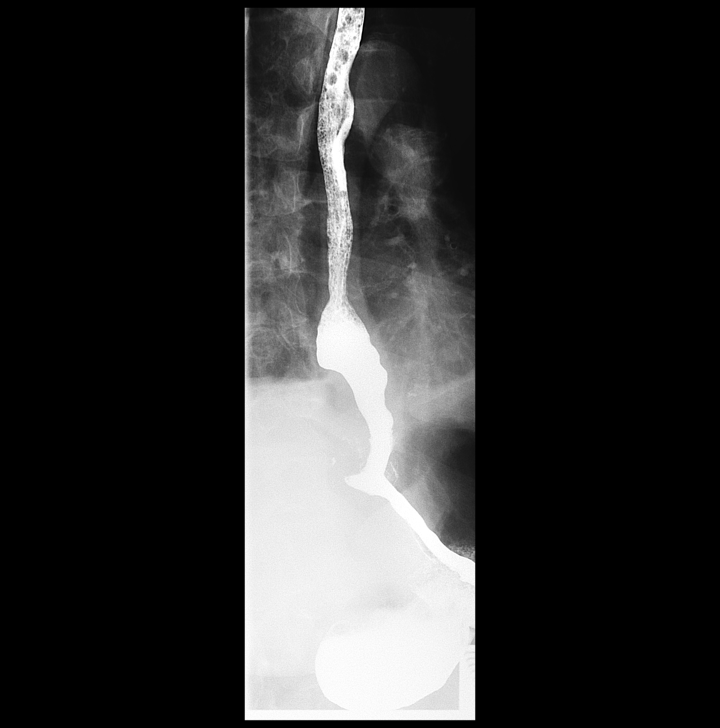

[Series 9: fluoro_barium singleshot_bw · 0.19mm/px · 1 of 1 slices shown (2 of 7)]
[im 1/1]
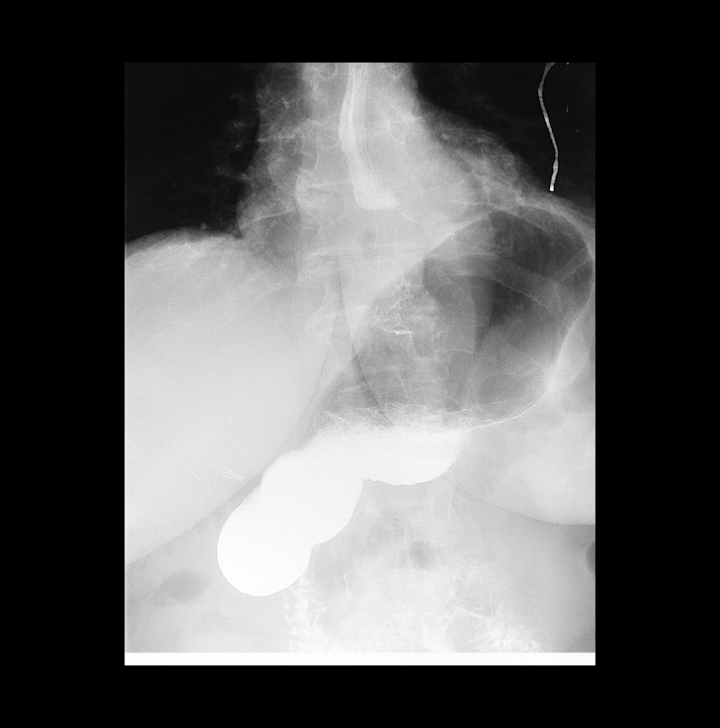

[Series 12: fluoro_barium singleshot_bw · 0.20mm/px · 1 of 1 slices shown (3 of 7)]
[im 1/1]
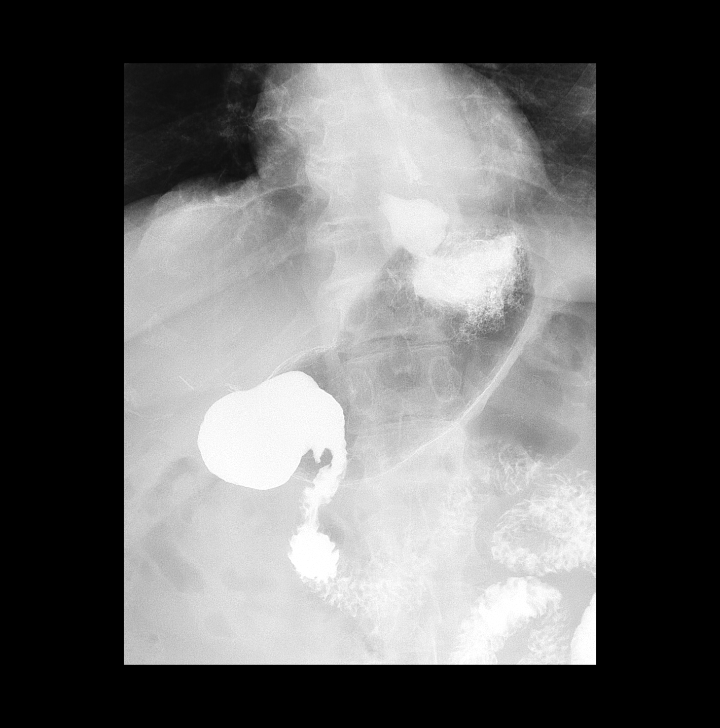

[Series 14: fluoro_barium singleshot_bw · 0.20mm/px · 1 of 1 slices shown (4 of 7)]
[im 1/1]
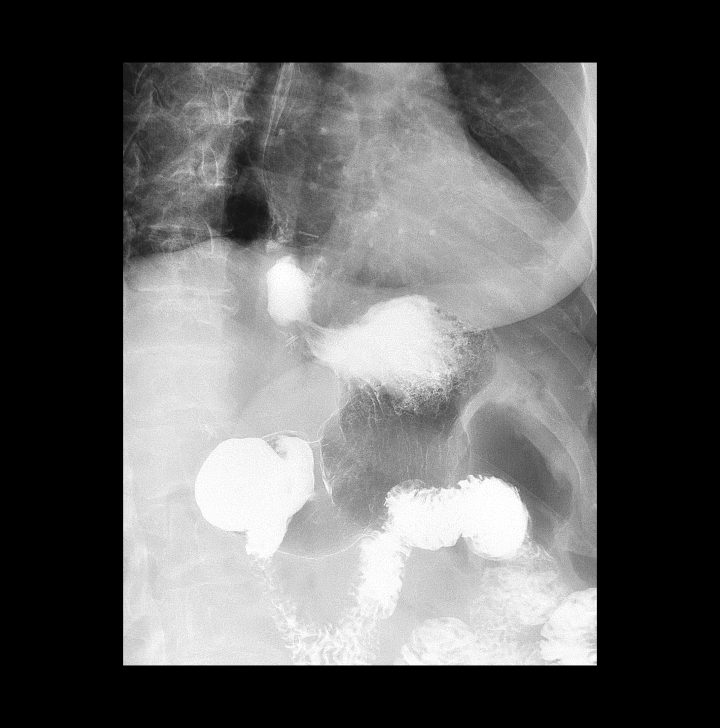

[Series 17: fluoro_barium singleshot_bw · 0.19mm/px · 1 of 1 slices shown (5 of 7)]
[im 1/1]
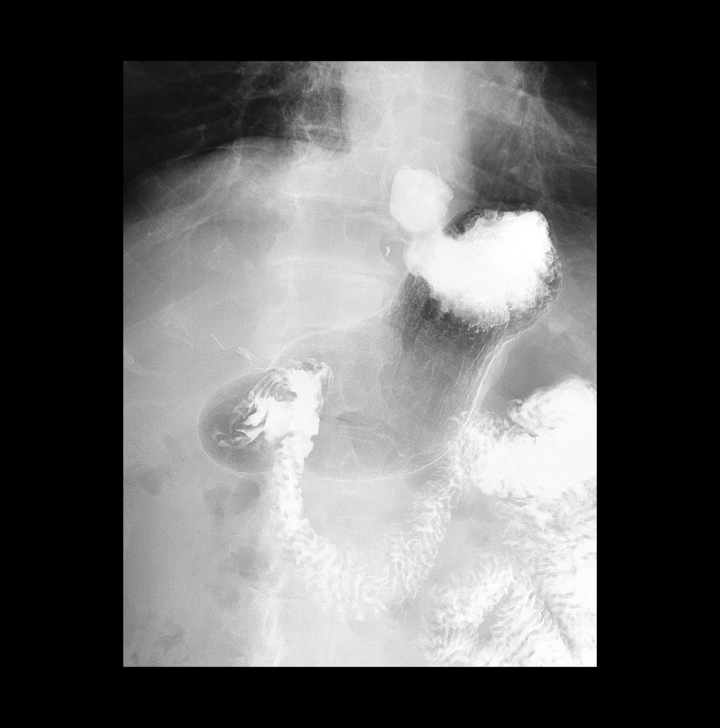

[Series 19: fluoro_barium singleshot_bw · 0.20mm/px · 1 of 1 slices shown (6 of 7)]
[im 1/1]
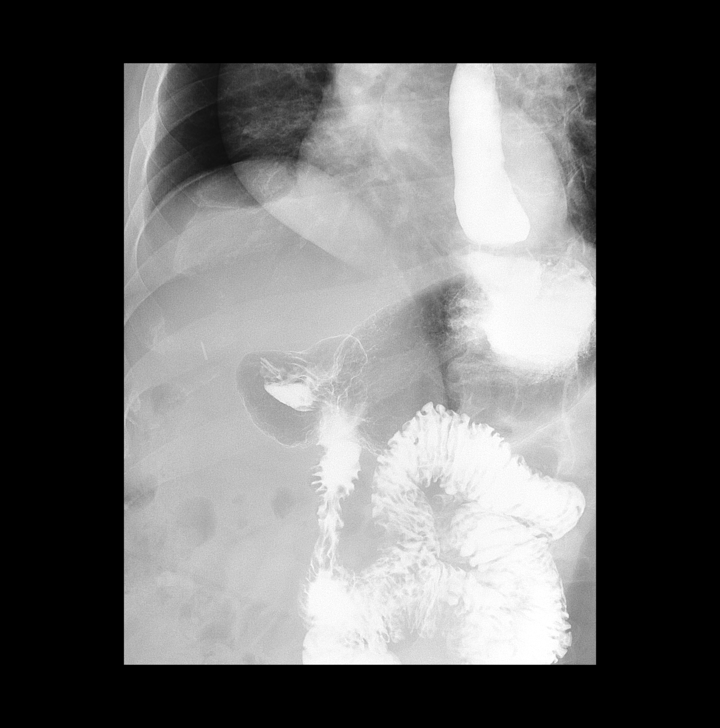

[Series 22: fluoro_barium singleshot_bw · 0.20mm/px · 1 of 1 slices shown (7 of 7)]
[im 1/1]
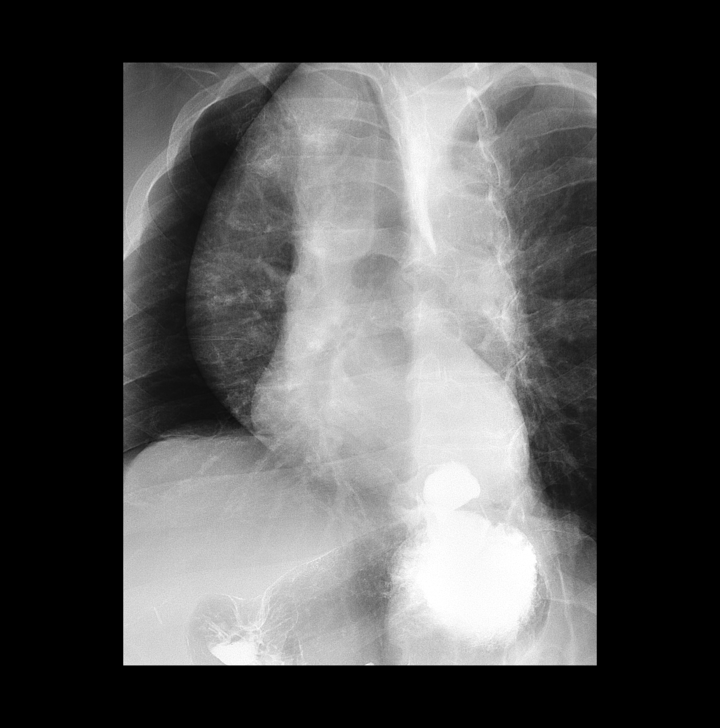

[12 of 24 positions shown; findings below may reference images not displayed]

FINDINGS: KUB demonstrates multiple surgical clips at the gastroesophageal
junction as well as clips from prior cholecystectomy. Bowel gas
pattern is normal.

There was flash laryngeal penetration with thin liquid but there was
no aspiration. The mucosa and motility of the esophagus are normal.

A 13 mm barium tablet passed immediately from the mouth to the
stomach with no delay.

Prominent spontaneous gastroesophageal reflux is demonstrated on
series 13 and on series 21 with a small hiatal hernia. The stomach
otherwise appears normal. Pylorus and duodenal bulb and C-loop are
normal.
IMPRESSION: 1. The patient does have silent laryngeal penetration with thin
liquids but there is no aspiration.
2. Prominent spontaneous gastroesophageal reflux. Small hiatal
hernia. The distal esophageal wrap demonstrated on the prior upper
GI is not appreciable on this exam.

## 2019-10-26 DIAGNOSIS — M25559 Pain in unspecified hip: Secondary | ICD-10-CM

## 2019-10-26 HISTORY — DX: Pain in unspecified hip: M25.559

## 2019-11-18 DIAGNOSIS — R7303 Prediabetes: Secondary | ICD-10-CM

## 2019-11-18 HISTORY — DX: Prediabetes: R73.03

## 2019-12-07 DIAGNOSIS — R001 Bradycardia, unspecified: Secondary | ICD-10-CM

## 2019-12-07 HISTORY — DX: Bradycardia, unspecified: R00.1

## 2019-12-09 ENCOUNTER — Encounter: Payer: Self-pay | Admitting: *Deleted

## 2019-12-09 DIAGNOSIS — R42 Dizziness and giddiness: Secondary | ICD-10-CM

## 2019-12-09 DIAGNOSIS — I10 Essential (primary) hypertension: Secondary | ICD-10-CM

## 2019-12-09 DIAGNOSIS — R001 Bradycardia, unspecified: Secondary | ICD-10-CM

## 2019-12-09 DIAGNOSIS — R55 Syncope and collapse: Secondary | ICD-10-CM

## 2019-12-10 ENCOUNTER — Encounter: Payer: Self-pay | Admitting: Cardiology

## 2019-12-11 ENCOUNTER — Ambulatory Visit (INDEPENDENT_AMBULATORY_CARE_PROVIDER_SITE_OTHER): Payer: Medicare Other | Admitting: Cardiology

## 2019-12-11 ENCOUNTER — Ambulatory Visit (INDEPENDENT_AMBULATORY_CARE_PROVIDER_SITE_OTHER): Payer: Medicare Other

## 2019-12-11 ENCOUNTER — Encounter: Payer: Self-pay | Admitting: Cardiology

## 2019-12-11 ENCOUNTER — Other Ambulatory Visit: Payer: Self-pay

## 2019-12-11 VITALS — BP 136/80 | HR 69 | Ht 63.0 in | Wt 136.0 lb

## 2019-12-11 DIAGNOSIS — E782 Mixed hyperlipidemia: Secondary | ICD-10-CM | POA: Diagnosis not present

## 2019-12-11 DIAGNOSIS — I2510 Atherosclerotic heart disease of native coronary artery without angina pectoris: Secondary | ICD-10-CM | POA: Diagnosis not present

## 2019-12-11 DIAGNOSIS — R42 Dizziness and giddiness: Secondary | ICD-10-CM | POA: Insufficient documentation

## 2019-12-11 DIAGNOSIS — R001 Bradycardia, unspecified: Secondary | ICD-10-CM | POA: Diagnosis not present

## 2019-12-11 HISTORY — DX: Dizziness and giddiness: R42

## 2019-12-11 NOTE — Progress Notes (Signed)
Cardiology Office Note:    Date:  12/11/2019   ID:  Taelor Waymire, DOB 1945-11-15, MRN [id number]  PCP:  Raina Mina., MD  Cardiologist:  Jenne Campus, MD    Referring MD: Bess Harvest*   Chief Complaint  Patient presents with  . Chest Pain  . Dizziness    History of Present Illness:    [name] is a 74 y.o. female with past medical history significant for vertigo, some depression, sinus bradycardia, dizziness.  Recently she was discharged from the hospital when she was kept there because of dizziness apparently work-up was unrevealing.  She was referred back to [place].  She complained of having dizziness the dizziness happen typically with changing position of her head as well as getting up very quickly.  She does not have any palpitations she does not feel her heart speeding up slowing down.  She does have however bradycardia which is sinus mechanism.  Years ago she did have a stress test done which was negative, she also had CT of her chest done which showed some calcification of the coronary artery.  She described to have some chest pain which is very atypical which is sharp located around left breast which is reproducible by pressing chest wall.  Past Medical History:  Diagnosis Date  . Abnormal chest x-ray with multiple lung nodules 08/22/2017  . Abnormal weight loss   . Age-related osteoporosis without current pathological fracture 03/15/2015   Last Assessment & Plan:  Formatting of this note might be different from the original. Relevant Hx: Course: Daily Update: Today's Plan:discussed in depth with her and she is going to try the boniva when she gets back from her [place] trip, and she was advised to increase her vitamin D 3 to 3000 IU daily after review of her lab and we reviewed her DEXA last year and she will be due for this in 2018  [place]  . Alopecia   . Anxiety    antianxiety med. usually at night but can take during the day for anxiety  . Arthralgia of hip  10/26/2019  . Arthritis    both knees & hands  also the back & neck  . Asthma   . Atrial fibrillation ([place])    pt. reports that [facility] that she had a "slight afib.", no further test needed (01/07/14 cardiology note does not mention any afib, just poor anterior r wave progression)  . Cervical radiculopathy 01/19/2017  . Chronic cough   . Chronic rhinitis 12/04/2012   Followed in Pulmonary clinic/ [facility]/ [place]   - sinus ct 10/23/2012 >>> Clear paranasal sinuses. - trial of qid 1st gen H1 02/12/2013 > not effective 05/28/13       . Complication of anesthesia   . Coronary artery disease involving native coronary artery of native heart without angina pectoris 06/08/2016   Formatting of this note might be different from the original. No longer sees cardiolgist.  ST okay.  Imaging with calcium  . Cough variant asthma 06/18/2013   - Methacholine challenge test 06/03/13 > equivocal but clinically caused tightness and made her cough - hfa 75% p coaching 06/17/13 > Trial of dulera 100 2bid started 06/18/2013    . Degenerative cervical disc 06/08/2016  . Depression   . Diaphragmatic hernia   . Elbow tendonitis   . Endometriosis   . Gastric polyp   . Gastroenteritis   . GERD (gastroesophageal reflux disease)   . Hiatal hernia 11/24/2013  . High risk medication  use 03/15/2015  . History of colonic polyps 02/09/2019  . History of hiatal hernia    pt. has been told that the hiatal hernia has reappeared  . History of melanoma 06/17/2017  . Hypercholesteremia   . Hyperlipidemia   . Hypertension    pt. reports that she use to take antihtn med, but reports since weight loss she has been able to come off.    . Hypothyroidism   . Insomnia   . 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  . Laryngopharyngeal reflux (LPR) 07/19/2014  . Lung nodules 09/16/2017   Formatting of this note might be different from the original. Following with [facility].  [place].Neg PET scan  . Malaise and fatigue  03/15/2015   Last Assessment & Plan:  Formatting of this note might be different from the original. Relevant Hx: Course: Daily Update: Today's Plan:she has some days better than others for her and she admits that with her allergies and asthma at times she is more tired as well as with the OA she has that creates some difficulty  Electronically signed by: Mayer Camel, NP 04/04/15 1058  . Melanoma ([place]) 2011   heel  . Mixed hyperlipidemia 03/15/2015  . 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 OSA (obstructive sleep apnea)    does not tolerate CPAP , pt. reports that its severe, but doesn't use it anymore, since weight loss.   Study done in [place], not sure where.   . Osteoarthritis   . Osteoporosis   . PONV (postoperative nausea and vomiting)   . Prediabetes 11/18/2019  . Primary insomnia 03/15/2015  . Primary osteoarthritis involving multiple joints 03/15/2015   Last Assessment & Plan:  Formatting of this note might be different from the original. Relevant Hx: Course: Daily Update: Today's Plan:her xrays we reviewed today showed she had multiple degenerative areas to her tspine and her LSpine with the old compression deformities and on review she had suffered a serious fall in the 1980's which was likely the source of this completely for her despite havin  . 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 Restless leg syndrome 11/18/2017  . S/P knee replacement 01/29/2014  . Seasonal allergic rhinitis due to pollen 03/17/2019  . Sinus bradycardia by electrocardiogram 12/07/2019  . Upper airway cough syndrome 08/04/2012   Followed in Pulmonary clinic/ [facility]/ [place]  -[place] eval around 2012 pos dust/ roach  Neg resp to singulair  - PFT's 02/2010 nl in effort dep portion of f/v loop, lung vol and dlcos also nl  - CT chest 05/27/12 wnl x small HH - 08/04/12   [facility] One AT >  MM - informed 09/10/2012 had not received demerol rx on 09/02/12  - sinus ct 10/23/2012 >>> Clear  paranasal sinuses. - responded to neuronti  . Vitamin B 12 deficiency   . Vitamin D deficiency     Past Surgical History:  Procedure Laterality Date  . ABDOMINAL HYSTERECTOMY    . APPENDECTOMY    . CHOLECYSTECTOMY    . COLONOSCOPY  05/09/2015   Colonic Polyps, moderate sigmoid diverticulosis. Bx: Tubular Adenomas. Negative  . DILATION AND CURETTAGE, DIAGNOSTIC / THERAPEUTIC    . ESOPHAGEAL DILATION  08/2016  . EYE SURGERY     cataracts removed / w IOL  . GALLBLADDER SURGERY    . HERNIA REPAIR    . HIATAL HERNIA REPAIR  2/14  . HIATAL HERNIA REPAIR  2014   revision of 2018 in [place]  . KNEE  ARTHROSCOPY     both knees for torn cartilage   . NERVE REPAIR Left 07/11/2017   Procedure: Excision of saphenous nerve left knee. Right knee intra-articular injection.;  Surgeon: Paralee Cancel, MD;  Location: [facility];  Service: Orthopedics;  Laterality: Left;  60 mins  . OOPHORECTOMY    . TONSILLECTOMY    . TOTAL KNEE ARTHROPLASTY Left 01/29/2014   Procedure: LEFT TOTAL KNEE ARTHROPLASTY MCL REPAIR;  Surgeon: Augustin Schooling, MD;  Location: [place] [facility];  Service: Orthopedics;  Laterality: Left;  Marland Kitchen VESICOVAGINAL FISTULA CLOSURE W/ TAH      Current Medications: Current Meds  Medication Sig  . acetaminophen (TYLENOL) 500 MG tablet Take 500 mg by mouth every 6 (six) hours as needed for mild pain.   . Cholecalciferol (VITAMIN D3) 50 MCG (2000 UT) TABS Take 1 tablet by mouth daily.  . clotrimazole-betamethasone (LOTRISONE) lotion Apply topically in the morning and at bedtime.  . diphenoxylate-atropine (LOMOTIL) 2.5-0.025 MG tablet Take 1 tablet by mouth 3 (three) times daily.  Marland Kitchen escitalopram (LEXAPRO) 10 MG tablet Take 10 mg by mouth daily.  Marland Kitchen levothyroxine (SYNTHROID, LEVOTHROID) 88 MCG tablet Take 88 mcg by mouth daily before breakfast.  . LORazepam (ATIVAN) 0.5 MG tablet Take 0.5 mg by mouth at bedtime.  . meclizine (ANTIVERT) 12.5 MG tablet Take 12.5 mg by mouth 3 (three) times daily as  needed.  . Melatonin 5 MG TABS Take 1 tablet by mouth daily.  . methocarbamol (ROBAXIN) 500 MG tablet Take 500 mg by mouth daily as needed for muscle spasms.  . pantoprazole (PROTONIX) 40 MG tablet Take 40 mg by mouth daily.  . promethazine (PHENERGAN) 12.5 MG tablet Take 12.5 mg by mouth every 6 (six) hours as needed for nausea or vomiting.  . Turmeric (QC TUMERIC COMPLEX PO) Take 1,000 mcg by mouth daily.  . vitamin B-12 (CYANOCOBALAMIN) 1000 MCG tablet Take 1,000 mcg by mouth daily.     Allergies:   Neurontin [gabapentin], Ciprofloxacin, Codeine, Eggs or egg-derived products, Other, Oxycodone, Tramadol, Cyclobenzaprine, and Sulfa antibiotics   Social History   Socioeconomic History  . Marital status: Married    Spouse name: Not on file  . Number of children: Not on file  . Years of education: Not on file  . Highest education level: Not on file  Occupational History  . 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 Smoking status: Never Smoker  . Smokeless tobacco: Never Used  Vaping Use  . Vaping Use: Never used  Substance and Sexual Activity  . Alcohol use: No    Comment: rare for holiday consumption only  . Drug use: No  . Sexual activity: Not on file  Other Topics Concern  . Not on file  Social History Narrative   Lives with husband   Retired   Investment banker, operational of Radio broadcast assistant Strain: Not on file  Food Insecurity: Not on file  Transportation Needs: Not on file  Physical Activity: Not on file  Stress: Not on file  Social Connections: Not on file     Family History: The patient's family history includes Arthritis in her brother, father, and mother; Asthma in her brother and mother; Breast cancer in her maternal grandmother and sister; Colon cancer in her mother; Emphysema in her father; Heart disease in her father, maternal grandmother, paternal aunt, and paternal grandmother. ROS:   Please see the history of  present illness.  All 14 point review of systems negative except as described per history of present illness  EKGs/Labs/Other Studies Reviewed:      Recent Labs: No results found for requested labs within last 8760 hours.  Recent Lipid Panel No results found for: CHOL, TRIG, HDL, CHOLHDL, VLDL, LDLCALC, LDLDIRECT  Physical Exam:    VS:  BP 136/80 (BP Location: Left Arm, Patient Position: Sitting)   Pulse 69   Ht 5\' 3"  (1.6 m)   Wt 136 lb (61.7 kg)   SpO2 96%   BMI 24.09 kg/m     Wt Readings from Last 3 Encounters:  12/11/19 136 lb (61.7 kg)  12/07/19 134 lb (60.8 kg)  01/15/19 140 lb (63.5 kg)     GEN:  Well nourished, well developed in no acute distress HEENT: Normal NECK: No JVD; No carotid bruits LYMPHATICS: No lymphadenopathy CARDIAC: RRR, no murmurs, no rubs, no gallops RESPIRATORY:  Clear to auscultation without rales, wheezing or rhonchi  ABDOMEN: Soft, non-tender, non-distended MUSCULOSKELETAL:  No edema; No deformity  SKIN: Warm and dry LOWER EXTREMITIES: no swelling NEUROLOGIC:  Alert and oriented x 3 PSYCHIATRIC:  Normal affect   ASSESSMENT:    1. Sinus bradycardia   2. Dizziness   3. Coronary artery disease involving native coronary artery of native heart without angina pectoris   4. Mixed hyperlipidemia    PLAN:    In order of problems listed above:  1. Sinus bradycardia.  I will ask you to wear Zio patch for a week to make sure there is no significant bradycardia.  So far what I see is not impressive.  The key will be also to correlate her symptoms with EKG. 2. Dizziness from the description she gave you look very atypical for cardiac arrhythmia but again sinus bradycardia is somewhat suspicious.  She did have echocardiogram done in the hospital which I am getting report of right now. 3. Coronary disease in form of calcification of the coronary arteries.  Denies have any symptoms that would suggest active problem but this is something that we may  be forced to investigate in the future. 4. Dyslipidemia: Her LDL is 147 her triglycerides 104 and her HDL is 73.  She is not on any statin but this is an issue that we will discuss in details.   Medication Adjustments/Labs and Tests Ordered: Current medicines are reviewed at length with the patient today.  Concerns regarding medicines are outlined above.  No orders of the defined types were placed in this encounter.  Medication changes: No orders of the defined types were placed in this encounter.   Signed, Park Liter, MD, [facility] 12/11/2019 4:10 PM    [place]

## 2019-12-11 NOTE — Patient Instructions (Signed)
Medication Instructions:  Your physician recommends that you continue on your current medications as directed. Please refer to the Current Medication list given to you today.  *If you need a refill on your cardiac medications before your next appointment, please call your pharmacy*   Lab Work: None. If you have labs (blood work) drawn today and your tests are completely normal, you will receive your results only by: . MyChart Message (if you have MyChart) OR . A paper copy in the mail If you have any lab test that is abnormal or we need to change your treatment, we will call you to review the results.   Testing/Procedures: A zio monitor was ordered today. It will remain on for 7 days. You will then return monitor and event diary in provided box. It takes 1-2 weeks for report to be downloaded and returned to us. We will call you with the results. If monitor falls off or has orange flashing light, please call Zio for further instructions.      Follow-Up: At [facility], you and your health needs are our priority.  As part of our continuing mission to provide you with exceptional heart care, we have created designated Provider Care Teams.  These Care Teams include your primary Cardiologist (physician) and Advanced Practice Providers (APPs -  Physician Assistants and Nurse Practitioners) who all work together to provide you with the care you need, when you need it.  We recommend signing up for the patient portal called "MyChart".  Sign up information is provided on this After Visit Summary.  MyChart is used to connect with patients for Virtual Visits (Telemedicine).  Patients are able to view lab/test results, encounter notes, upcoming appointments, etc.  Non-urgent messages can be sent to your provider as well.   To learn more about what you can do with MyChart, go to [email or web address].    Your next appointment:   2 month(s)  The format for your next appointment:   In  Person  Provider:   Robert Krasowski, MD   Other Instructions   

## 2019-12-17 ENCOUNTER — Telehealth: Payer: Self-pay | Admitting: Cardiology

## 2019-12-17 NOTE — Telephone Encounter (Signed)
Patient states she has been pushing the button for her monitor, but she has not been recording when she pushed it. She states she is supposed to send it back tomorrow and would like to know if that is okay.

## 2019-12-18 NOTE — Telephone Encounter (Signed)
Called patient informed her that monitor she have recorded any irregular rhythm she verbally understood no further questions.

## 2019-12-18 NOTE — Telephone Encounter (Signed)
That is fine, monitor should record her triggering.

## 2020-02-22 ENCOUNTER — Other Ambulatory Visit: Payer: Self-pay

## 2020-02-22 DIAGNOSIS — N809 Endometriosis, unspecified: Secondary | ICD-10-CM | POA: Insufficient documentation

## 2020-02-22 DIAGNOSIS — F419 Anxiety disorder, unspecified: Secondary | ICD-10-CM | POA: Insufficient documentation

## 2020-02-22 DIAGNOSIS — K529 Noninfective gastroenteritis and colitis, unspecified: Secondary | ICD-10-CM | POA: Insufficient documentation

## 2020-02-22 DIAGNOSIS — I4891 Unspecified atrial fibrillation: Secondary | ICD-10-CM | POA: Insufficient documentation

## 2020-02-22 DIAGNOSIS — J45909 Unspecified asthma, uncomplicated: Secondary | ICD-10-CM | POA: Insufficient documentation

## 2020-02-22 DIAGNOSIS — F32A Depression, unspecified: Secondary | ICD-10-CM | POA: Insufficient documentation

## 2020-02-22 DIAGNOSIS — M1990 Unspecified osteoarthritis, unspecified site: Secondary | ICD-10-CM | POA: Insufficient documentation

## 2020-02-22 DIAGNOSIS — E7800 Pure hypercholesterolemia, unspecified: Secondary | ICD-10-CM | POA: Insufficient documentation

## 2020-02-22 DIAGNOSIS — G4730 Sleep apnea, unspecified: Secondary | ICD-10-CM | POA: Insufficient documentation

## 2020-02-22 DIAGNOSIS — E538 Deficiency of other specified B group vitamins: Secondary | ICD-10-CM | POA: Insufficient documentation

## 2020-02-22 DIAGNOSIS — Z8719 Personal history of other diseases of the digestive system: Secondary | ICD-10-CM | POA: Insufficient documentation

## 2020-02-22 DIAGNOSIS — I10 Essential (primary) hypertension: Secondary | ICD-10-CM | POA: Insufficient documentation

## 2020-02-22 DIAGNOSIS — K317 Polyp of stomach and duodenum: Secondary | ICD-10-CM | POA: Insufficient documentation

## 2020-02-22 DIAGNOSIS — G4700 Insomnia, unspecified: Secondary | ICD-10-CM | POA: Insufficient documentation

## 2020-02-22 DIAGNOSIS — G4733 Obstructive sleep apnea (adult) (pediatric): Secondary | ICD-10-CM | POA: Insufficient documentation

## 2020-02-22 DIAGNOSIS — I471 Supraventricular tachycardia: Secondary | ICD-10-CM | POA: Insufficient documentation

## 2020-02-22 DIAGNOSIS — L659 Nonscarring hair loss, unspecified: Secondary | ICD-10-CM | POA: Insufficient documentation

## 2020-02-22 DIAGNOSIS — M810 Age-related osteoporosis without current pathological fracture: Secondary | ICD-10-CM | POA: Insufficient documentation

## 2020-02-22 DIAGNOSIS — K449 Diaphragmatic hernia without obstruction or gangrene: Secondary | ICD-10-CM | POA: Insufficient documentation

## 2020-02-22 DIAGNOSIS — R634 Abnormal weight loss: Secondary | ICD-10-CM | POA: Insufficient documentation

## 2020-02-22 DIAGNOSIS — E785 Hyperlipidemia, unspecified: Secondary | ICD-10-CM | POA: Insufficient documentation

## 2020-02-22 DIAGNOSIS — M778 Other enthesopathies, not elsewhere classified: Secondary | ICD-10-CM | POA: Insufficient documentation

## 2020-02-22 DIAGNOSIS — Z9889 Other specified postprocedural states: Secondary | ICD-10-CM | POA: Insufficient documentation

## 2020-02-22 DIAGNOSIS — T8859XA Other complications of anesthesia, initial encounter: Secondary | ICD-10-CM | POA: Insufficient documentation

## 2020-02-22 DIAGNOSIS — R112 Nausea with vomiting, unspecified: Secondary | ICD-10-CM | POA: Insufficient documentation

## 2020-02-22 DIAGNOSIS — R053 Chronic cough: Secondary | ICD-10-CM | POA: Insufficient documentation

## 2020-03-02 ENCOUNTER — Encounter: Payer: Self-pay | Admitting: Cardiology

## 2020-03-02 ENCOUNTER — Ambulatory Visit (INDEPENDENT_AMBULATORY_CARE_PROVIDER_SITE_OTHER): Payer: Medicare Other | Admitting: Cardiology

## 2020-03-02 ENCOUNTER — Other Ambulatory Visit: Payer: Self-pay

## 2020-03-02 VITALS — BP 136/80 | HR 66 | Ht 62.0 in | Wt 139.0 lb

## 2020-03-02 DIAGNOSIS — I10 Essential (primary) hypertension: Secondary | ICD-10-CM | POA: Diagnosis not present

## 2020-03-02 DIAGNOSIS — E782 Mixed hyperlipidemia: Secondary | ICD-10-CM | POA: Diagnosis not present

## 2020-03-02 DIAGNOSIS — I2510 Atherosclerotic heart disease of native coronary artery without angina pectoris: Secondary | ICD-10-CM | POA: Diagnosis not present

## 2020-03-02 DIAGNOSIS — R001 Bradycardia, unspecified: Secondary | ICD-10-CM | POA: Diagnosis not present

## 2020-03-02 MED ORDER — DILTIAZEM HCL ER COATED BEADS 120 MG PO CP24: 120.0000 mg | ORAL_CAPSULE | Freq: Every day | ORAL | 1 refills | Status: DC

## 2020-03-02 NOTE — Progress Notes (Signed)
Cardiology Office Note:    Date:  03/02/2020   ID:  [name], DOB 12/04/1945, MRN [id number]  PCP:  Raina Mina., MD  Cardiologist:  Jenne Campus, MD    Referring MD: Raina Mina., MD   Chief Complaint  Patient presents with  . Follow-up  I am doing fine  History of Present Illness:    [name] is a 75 y.o. female with past medical history significant for sinus bradycardia, dizziness, depression, vertigo.  She was referred to [place] because of episode of bradycardia and some dizziness.  I did ask her to wear monitor which she did monitor did not show any significant arrhythmia except for multiple episode of supraventricular tachycardia.  There was no significant bradycardia.  She still complain of having episode that she is profoundly weak tired and exhausted she does not feel any palpitation she cannot tell me when she is in and tachycardia.  I am worried that maybe her symptomatology is related to tachycardia.  Past Medical History:  Diagnosis Date  . Abnormal chest x-ray with multiple lung nodules 08/22/2017  . Abnormal weight loss   . Age-related osteoporosis without current pathological fracture 03/15/2015   Last Assessment & Plan:  Formatting of this note might be different from the original. Relevant Hx: Course: Daily Update: Today's Plan:discussed in depth with her and she is going to try the boniva when she gets back from her [place] trip, and she was advised to increase her vitamin D 3 to 3000 IU daily after review of her lab and we reviewed her DEXA last year and she will be due for this in 2018  [place]  . Alopecia   . Anxiety    antianxiety med. usually at night but can take during the day for anxiety  . Arthralgia of hip 10/26/2019  . Arthritis    both knees & hands  also the back & neck  . Asthma   . Atrial fibrillation ([place])    pt. reports that [facility] that she had a "slight afib.", no further test needed (01/07/14 cardiology note does not mention any afib, just  poor anterior r wave progression)  . Cervical radiculopathy 01/19/2017  . Chronic cough   . Chronic rhinitis 12/04/2012   Followed in Pulmonary clinic/ [facility]/ [place]   - sinus ct 10/23/2012 >>> Clear paranasal sinuses. - trial of qid 1st gen H1 02/12/2013 > not effective 05/28/13       . Complication of anesthesia   . Coronary artery disease involving native coronary artery of native heart without angina pectoris 06/08/2016   Formatting of this note might be different from the original. No longer sees cardiolgist.  ST okay.  Imaging with calcium  . Cough variant asthma 06/18/2013   - Methacholine challenge test 06/03/13 > equivocal but clinically caused tightness and made her cough - hfa 75% p coaching 06/17/13 > Trial of dulera 100 2bid started 06/18/2013    . Degenerative cervical disc 06/08/2016  . Depression   . Diaphragmatic hernia   . Dizziness 12/11/2019  . Elbow tendonitis   . Endometriosis   . Gastric polyp   . Gastroenteritis   . GERD (gastroesophageal reflux disease)   . Hiatal hernia 11/24/2013  . High risk medication use 03/15/2015  . History of colonic polyps 02/09/2019  . History of hiatal hernia    pt. has been told that the hiatal hernia has reappeared  . History of melanoma 06/17/2017  . Hypercholesteremia   . Hyperlipidemia   .  Hypertension    pt. reports that she use to take antihtn med, but reports since weight loss she has been able to come off.    . Hypothyroidism   . Insomnia   . 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  . Laryngopharyngeal reflux (LPR) 07/19/2014  . Lung nodules 09/16/2017   Formatting of this note might be different from the original. Following with [facility].  [place].Neg PET scan  . Malaise and fatigue 03/15/2015   Last Assessment & Plan:  Formatting of this note might be different from the original. Relevant Hx: Course: Daily Update: Today's Plan:she has some days better than others for her and she admits that with her allergies  and asthma at times she is more tired as well as with the OA she has that creates some difficulty  Electronically signed by: Mayer Camel, NP 04/04/15 1058  . Melanoma ([employer]) 2011   heel  . Mixed hyperlipidemia 03/15/2015  . 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 OSA (obstructive sleep apnea)    does not tolerate CPAP , pt. reports that its severe, but doesn't use it anymore, since weight loss.   Study done in [place], not sure where.   . Osteoarthritis   . Osteoporosis   . PONV (postoperative nausea and vomiting)   . Prediabetes 11/18/2019  . Primary insomnia 03/15/2015  . Primary osteoarthritis involving multiple joints 03/15/2015   Last Assessment & Plan:  Formatting of this note might be different from the original. Relevant Hx: Course: Daily Update: Today's Plan:her xrays we reviewed today showed she had multiple degenerative areas to her tspine and her LSpine with the old compression deformities and on review she had suffered a serious fall in the 1980's which was likely the source of this completely for her despite havin  . Reactive airways dysfunction syndrome ([place]) 07/19/2014  . Restless leg syndrome 11/18/2017  . S/P knee replacement 01/29/2014  . Seasonal allergic rhinitis due to pollen 03/17/2019  . Sinus bradycardia 12/07/2019  . Sinus bradycardia by electrocardiogram 12/07/2019  . Upper airway cough syndrome 08/04/2012   Followed in Pulmonary clinic/ [facility]/ [place]  -[place] eval around 2012 pos dust/ roach  Neg resp to singulair  - PFT's 02/2010 nl in effort dep portion of f/v loop, lung vol and dlcos also nl  - CT chest 05/27/12 wnl x small HH - 08/04/12   [facility] AT >  MM - informed 09/10/2012 had not received demerol rx on 09/02/12  - sinus ct 10/23/2012 >>> Clear paranasal sinuses. - responded to neuronti  . Vitamin B 12 deficiency   . Vitamin D deficiency     Past Surgical History:  Procedure Laterality Date  . ABDOMINAL HYSTERECTOMY    . APPENDECTOMY     . CHOLECYSTECTOMY    . COLONOSCOPY  05/09/2015   Colonic Polyps, moderate sigmoid diverticulosis. Bx: Tubular Adenomas. Negative  . DILATION AND CURETTAGE, DIAGNOSTIC / THERAPEUTIC    . ESOPHAGEAL DILATION  08/2016  . EYE SURGERY     cataracts removed / w IOL  . GALLBLADDER SURGERY    . HERNIA REPAIR    . HIATAL HERNIA REPAIR  2/14  . HIATAL HERNIA REPAIR  2014   revision of 2018 in [place]  . KNEE ARTHROSCOPY     both knees for torn cartilage   . NERVE REPAIR Left 07/11/2017   Procedure: Excision of saphenous nerve left knee. Right knee intra-articular injection.;  Surgeon: Paralee Cancel, MD;  Location: [facility];  Service: Orthopedics;  Laterality: Left;  60 mins  . OOPHORECTOMY    . TONSILLECTOMY    . TOTAL KNEE ARTHROPLASTY Left 01/29/2014   Procedure: LEFT TOTAL KNEE ARTHROPLASTY MCL REPAIR;  Surgeon: Augustin Schooling, MD;  Location: [place];  Service: Orthopedics;  Laterality: Left;  Marland Kitchen VESICOVAGINAL FISTULA CLOSURE W/ TAH      Current Medications: Current Meds  Medication Sig  . acetaminophen (TYLENOL) 500 MG tablet Take 500 mg by mouth every 6 (six) hours as needed for mild pain.   . Cholecalciferol (VITAMIN D3) 50 MCG (2000 UT) TABS Take 1 tablet by mouth daily.  Marland Kitchen dicyclomine (BENTYL) 10 MG capsule Take 10 mg by mouth every 6 (six) hours as needed (GI issues).  Marland Kitchen diltiazem (CARDIZEM CD) 120 MG 24 hr capsule Take 1 capsule (120 mg total) by mouth daily.  . diphenoxylate-atropine (LOMOTIL) 2.5-0.025 MG tablet Take 1 tablet by mouth 3 (three) times daily.  Marland Kitchen escitalopram (LEXAPRO) 10 MG tablet Take 10 mg by mouth daily.  Marland Kitchen levothyroxine (SYNTHROID, LEVOTHROID) 88 MCG tablet Take 88 mcg by mouth daily before breakfast.  . LORazepam (ATIVAN) 0.5 MG tablet Take 0.5 mg by mouth at bedtime.  . Melatonin 5 MG TABS Take 6 mg by mouth daily.  . methocarbamol (ROBAXIN) 500 MG tablet Take 500 mg by mouth daily as needed for muscle spasms.  . pantoprazole (PROTONIX) 40 MG tablet Take  40 mg by mouth daily.  . promethazine (PHENERGAN) 12.5 MG tablet Take 12.5 mg by mouth every 6 (six) hours as needed for nausea or vomiting.  . Turmeric (QC TUMERIC COMPLEX PO) Take 1,000 mcg by mouth daily.     Allergies:   Neurontin [gabapentin], Ciprofloxacin, Codeine, Eggs or egg-derived products, Other, Oxycodone, Tramadol, Cyclobenzaprine, and Sulfa antibiotics   Social History   Socioeconomic History  . Marital status: Married    Spouse name: Not on file  . Number of children: Not on file  . Years of education: Not on file  . Highest education level: Not on file  Occupational History  . 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 Smoking status: Never Smoker  . Smokeless tobacco: Never Used  Vaping Use  . Vaping Use: Never used  Substance and Sexual Activity  . Alcohol use: No    Comment: rare for holiday consumption only  . Drug use: No  . Sexual activity: Not on file  Other Topics Concern  . Not on file  Social History Narrative   Lives with husband   Retired   Investment banker, operational of Radio broadcast assistant Strain: Not on file  Food Insecurity: Not on file  Transportation Needs: Not on file  Physical Activity: Not on file  Stress: Not on file  Social Connections: Not on file     Family History: The patient's family history includes Arthritis in her brother, father, and mother; Asthma in her brother and mother; Breast cancer in her maternal grandmother and sister; Colon cancer in her mother; Emphysema in her father; Heart disease in her father, maternal grandmother, paternal aunt, and paternal grandmother. ROS:   Please see the history of present illness.    All 14 point review of systems negative except as described per history of present illness  EKGs/Labs/Other Studies Reviewed:      Recent Labs: No results found for requested labs within last 8760 hours.  Recent Lipid Panel No results found for: CHOL, TRIG,  HDL, CHOLHDL, VLDL, LDLCALC, LDLDIRECT  Physical Exam:    VS:  BP 136/80 (BP Location: Left Arm, Patient Position: Sitting)   Pulse 66   Ht 5\' 2"  (1.575 m)   Wt 139 lb (63 kg)   SpO2 97%   BMI 25.42 kg/m     Wt Readings from Last 3 Encounters:  03/02/20 139 lb (63 kg)  12/11/19 136 lb (61.7 kg)  12/07/19 134 lb (60.8 kg)     GEN:  Well nourished, well developed in no acute distress HEENT: Normal NECK: No JVD; No carotid bruits LYMPHATICS: No lymphadenopathy CARDIAC: RRR, no murmurs, no rubs, no gallops RESPIRATORY:  Clear to auscultation without rales, wheezing or rhonchi  ABDOMEN: Soft, non-tender, non-distended MUSCULOSKELETAL:  No edema; No deformity  SKIN: Warm and dry LOWER EXTREMITIES: no swelling NEUROLOGIC:  Alert and oriented x 3 PSYCHIATRIC:  Normal affect   ASSESSMENT:    1. Coronary artery disease involving native coronary artery of native heart without angina pectoris   2. Sinus bradycardia   3. Primary hypertension   4. Mixed hyperlipidemia    PLAN:    In order of problems listed above:  1. Coronary artery disease based on CT which was followed by stress test showing no evidence of ischemia.  The key is risk factors modifications which we doing.  We did discuss need to exercise on the regular basis and also proper diet. 2. Sinus bradycardia not critical.  Is not responsible for her symptoms. 3. Supraventricular tachycardia that is a new diagnosis I will try to treat this with calcium channel blocker hoping that that will not suppress sinus node sufficiently could cause significant bradycardia and will simply see how she responds to this and see if she feels better.  Her symptomatology could be related to supraventricular ventricular tachycardia.   Medication Adjustments/Labs and Tests Ordered: Current medicines are reviewed at length with the patient today.  Concerns regarding medicines are outlined above.  Orders Placed This Encounter  Procedures  .  EKG 12-Lead   Medication changes:  Meds ordered this encounter  Medications  . diltiazem (CARDIZEM CD) 120 MG 24 hr capsule    Sig: Take 1 capsule (120 mg total) by mouth daily.    Dispense:  90 capsule    Refill:  1    Signed, Park Liter, MD, [facility] 03/02/2020 4:28 PM    [place]

## 2020-03-02 NOTE — Patient Instructions (Signed)
Medication Instructions:  Your physician has recommended you make the following change in your medication:   START: Cardizem 120 mg daily   *If you need a refill on your cardiac medications before your next appointment, please call your pharmacy*   Lab Work: None. If you have labs (blood work) drawn today and your tests are completely normal, you will receive your results only by: Marland Kitchen MyChart Message (if you have MyChart) OR . A paper copy in the mail If you have any lab test that is abnormal or we need to change your treatment, we will call you to review the results.   Testing/Procedures: None.   Follow-Up: At [facility], you and your health needs are our priority.  As part of our continuing mission to provide you with exceptional heart care, we have created designated Provider Care Teams.  These Care Teams include your primary Cardiologist (physician) and Advanced Practice Providers (APPs -  Physician Assistants and Nurse Practitioners) who all work together to provide you with the care you need, when you need it.  We recommend signing up for the patient portal called "MyChart".  Sign up information is provided on this After Visit Summary.  MyChart is used to connect with patients for Virtual Visits (Telemedicine).  Patients are able to view lab/test results, encounter notes, upcoming appointments, etc.  Non-urgent messages can be sent to your provider as well.   To learn more about what you can do with MyChart, go to [email or web address].    Your next appointment:   3 month(s)  The format for your next appointment:   In Person  Provider:   Jenne Campus, MD   Other Instructions  Diltiazem Tablets What is this medicine? DILTIAZEM (dil TYE a zem) is a calcium channel blocker. It relaxes your blood vessels and decreases the amount of work the heart has to do. It treats and/or prevents chest pain (also called angina). This medicine may be used for other purposes; ask  your health care provider or pharmacist if you have questions. COMMON BRAND NAME(S): Cardizem What should I tell my health care provider before I take this medicine? They need to know if you have any of these conditions:  heart attack  heart disease  irregular heartbeat or rhythm  low blood pressure  an unusual or allergic reaction to diltiazem, other drugs, foods, dyes, or preservatives  pregnant or trying to get pregnant  breast-feeding How should I use this medicine? Take this drug by mouth. Take it as directed on the prescription label at the same time every day. Keep taking it unless your health care provider tells you to stop. Talk to your health care provider about the use of this drug in children. Special care may be needed. Overdosage: If you think you have taken too much of this medicine contact a poison control center or emergency room at once. NOTE: This medicine is only for you. Do not share this medicine with others. What if I miss a dose? If you miss a dose, take it as soon as you can. If it is almost time for your next dose, take only that dose. Do not take double or extra doses. What may interact with this medicine? Do not take this medicine with any of the following:  cisapride  hawthorn  pimozide  ranolazine  red yeast rice This medicine may also interact with the following medications:  buspirone  carbamazepine  cimetidine  cyclosporine  digoxin  local anesthetics or general anesthetics  lovastatin  medicines for anxiety or difficulty sleeping like midazolam and triazolam  medicines for high blood pressure or heart problems  quinidine  rifampin, rifabutin, or rifapentine This list may not describe all possible interactions. Give your health care provider a list of all the medicines, herbs, non-prescription drugs, or dietary supplements you use. Also tell them if you smoke, drink alcohol, or use illegal drugs. Some items may interact with  your medicine. What should I watch for while using this medicine? Visit your care team for regular checks on your progress. Check your blood pressure as directed. Ask your care team what your blood pressure should be. Also, find out when you should contact them. Do not treat yourself for coughs, colds, or pain while you are using this medication without asking your care team for advice. Some medications may increase your blood pressure. This medication may cause serious skin reactions. They can happen weeks to months after starting the medication. Contact your care team right away if you notice fevers or flu-like symptoms with a rash. The rash may be red or purple and then turn into blisters or peeling of the skin. Or, you might notice a red rash with swelling of the face, lips or lymph nodes in your neck or under your arms. You may get drowsy or dizzy. Do not drive, use machinery, or do anything that needs mental alertness until you know how this medication affects you. Do not stand up or sit up quickly, especially if you are an older patient. This reduces the risk of dizzy or fainting spells. What side effects may I notice from receiving this medicine? Side effects that you should report to your doctor or health care provider as soon as possible:  allergic reactions (skin rash, itching or hives; swelling of the face, lips, or tongue)  heart failure (trouble breathing; fast, irregular heartbeat; sudden weight gain; swelling of the ankles, feet, hands; unusually weak or tired)  heartbeat rhythm changes (trouble breathing; chest pain; dizziness; fast, irregular heartbeat; feeling faint or lightheaded, falls)  liver injury (dark yellow or brown urine; general ill feeling or flu-like symptoms; loss of appetite, right upper belly pain; unusually weak or tired, yellowing of the eyes or skin)  low blood pressure (dizziness; feeling faint or lightheaded, falls; unusually weak or tired)  redness,  blistering, peeling, or loosening of the skin, including inside the mouth Side effects that usually do not require medical attention (report to your doctor or health care provider if they continue or are bothersome):  changes in sex drive or performance  depressed mood  headache  sudden weight gain  nausea  trouble sleeping This list may not describe all possible side effects. Call your doctor for medical advice about side effects. You may report side effects to FDA at 1-800-FDA-1088. Where should I keep my medicine? Keep out of the reach of children and pets. Store at room temperature between 15 and 30 degrees C (59 and 86 degrees F). Protect from moisture. Keep the container tightly closed. Throw away any unused drug after the expiration date. NOTE: This sheet is a summary. It may not cover all possible information. If you have questions about this medicine, talk to your doctor, pharmacist, or health care provider.  2021 Elsevier/Gold Standard (2019-11-05 15:46:34)

## 2020-03-08 ENCOUNTER — Telehealth: Payer: Self-pay | Admitting: Cardiology

## 2020-03-08 NOTE — Telephone Encounter (Signed)
Spoke to the patient just now and let her know that diltiazem is not for diarrhea. She believes that she may have been mistaking for a different medication that her PCP gave her.    Encouraged patient to call back with any questions or concerns.

## 2020-03-08 NOTE — Telephone Encounter (Signed)
    Pt c/o medication issue:  1. Name of Medication: diltiazem (CARDIZEM CD) 120 MG 24 hr capsule  2. How are you currently taking this medication (dosage and times per day)? Take 1 capsule (120 mg total) by mouth daily.  3. Are you having a reaction (difficulty breathing--STAT)?   4. What is your medication issue? Pt has questions about this medication, she said when she listened to the video about diltiazem, that it is for diarrhea and for High BP. She said her pcp no longer wants her on a diarrhea meds. She also said Dr. Raliegh Ip prescribed her 2 medications but she only received one

## 2020-05-09 DIAGNOSIS — R590 Localized enlarged lymph nodes: Secondary | ICD-10-CM

## 2020-05-09 HISTORY — DX: Localized enlarged lymph nodes: R59.0

## 2020-06-17 ENCOUNTER — Ambulatory Visit (INDEPENDENT_AMBULATORY_CARE_PROVIDER_SITE_OTHER): Payer: Medicare Other | Admitting: Cardiology

## 2020-06-17 ENCOUNTER — Other Ambulatory Visit: Payer: Self-pay

## 2020-06-17 ENCOUNTER — Encounter: Payer: Self-pay | Admitting: Cardiology

## 2020-06-17 VITALS — BP 110/68 | HR 64 | Ht 62.0 in | Wt 140.4 lb

## 2020-06-17 DIAGNOSIS — K219 Gastro-esophageal reflux disease without esophagitis: Secondary | ICD-10-CM

## 2020-06-17 DIAGNOSIS — E782 Mixed hyperlipidemia: Secondary | ICD-10-CM | POA: Diagnosis not present

## 2020-06-17 DIAGNOSIS — R001 Bradycardia, unspecified: Secondary | ICD-10-CM | POA: Diagnosis not present

## 2020-06-17 DIAGNOSIS — I471 Supraventricular tachycardia: Secondary | ICD-10-CM

## 2020-06-17 NOTE — Progress Notes (Signed)
Cardiology Office Note:    Date:  06/17/2020   ID:  [name], DOB Jun 28, 1945, MRN [id number]  PCP:  Raina Mina., MD  Cardiologist:  Jenne Campus, MD    Referring MD: Raina Mina., MD   No chief complaint on file. Am doing fine  History of Present Illness:    [name] is a 75 y.o. female with past medical history significant for sinus bradycardia, dizziness, supraventricular tachycardia, depression, vertigo.  She comes today to my office to talk about her issues.  Overall she seems to be doing well.  Denies have any palpitations.  Dizziness improved significantly.  Interestingly she was taking some antidiarrhea medications and that made her dizzy and she stopped the medication that sensation went away.  She denies having any palpitations.  She is concerned about the lymph node being enlarged under the right armpit.  She did have ultrasounds of that area done which I reviewed showed no significant pathology.  She is also concerned about pain in the right shoulder which is not related to exercise.  She described also to have some left chest pain but does happen at rest never with exercise and it is actually localized in the left upper portion of the abdomen.  Does not look like cardiac issue to me.  Past Medical History:  Diagnosis Date   Abnormal chest x-ray with multiple lung nodules 08/22/2017   Abnormal weight loss    Age-related osteoporosis without current pathological fracture 03/15/2015   Last Assessment & Plan:  Formatting of this note might be different from the original. Relevant Hx: Course: Daily Update: Today's Plan:discussed in depth with her and she is going to try the boniva when she gets back from her [place] trip, and she was advised to increase her vitamin D 3 to 3000 IU daily after review of her lab and we reviewed her DEXA last year and she will be due for this in 2018  [place]   Alopecia    Anxiety    antianxiety med. usually at night but can take during the day  for anxiety   Arthralgia of hip 10/26/2019   Arthritis    both knees & hands  also the back & neck   Asthma    Atrial fibrillation ([place])    pt. reports that [facility] that she had a "slight afib.", no further test needed (01/07/14 cardiology note does not mention any afib, just poor anterior r wave progression)   Cervical radiculopathy 01/19/2017   Chronic cough    Chronic rhinitis 12/04/2012   Followed in Pulmonary clinic/ [facility]/ [place]   - sinus ct 10/23/2012 >>> Clear paranasal sinuses. - trial of qid 1st gen H1 02/12/2013 > not effective 7/90/24        Complication of anesthesia    Coronary artery disease involving native coronary artery of native heart without angina pectoris 06/08/2016   Formatting of this note might be different from the original. No longer sees cardiolgist.  ST okay.  Imaging with calcium   Cough variant asthma 06/18/2013   - Methacholine challenge test 06/03/13 > equivocal but clinically caused tightness and made her cough - hfa 75% p coaching 06/17/13 > Trial of dulera 100 2bid started 06/18/2013     Degenerative cervical disc 06/08/2016   Depression    Diaphragmatic hernia    Dizziness 12/11/2019   Elbow tendonitis    Endometriosis    Gastric polyp    Gastroenteritis    GERD (gastroesophageal reflux disease)  Hiatal hernia 11/24/2013   High risk medication use 03/15/2015   History of colonic polyps 02/09/2019   History of hiatal hernia    pt. has been told that the hiatal hernia has reappeared   History of melanoma 06/17/2017   Hypercholesteremia    Hyperlipidemia    Hypertension    pt. reports that she use to take antihtn med, but reports since weight loss she has been able to come off.     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 Following with [facility].  [place].Neg PET scan    Malaise and fatigue 03/15/2015   Last Assessment & Plan:  Formatting of this note might be different from the original. 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 reports that its severe, but doesn't use it anymore, since weight loss.   Study done in [place], not sure where.    Osteoarthritis    Osteoporosis    PONV (postoperative nausea and vomiting)    Prediabetes 11/18/2019   Primary insomnia 03/15/2015   Primary osteoarthritis involving multiple joints 03/15/2015   Last Assessment & Plan:  Formatting of this note might be different from the original. Relevant Hx: Course: Daily Update: Today's Plan:her xrays we reviewed today showed she had multiple degenerative areas to her tspine and her LSpine with the old compression deformities and on review she had suffered a serious fall in the 1980's which was likely the source of this completely for her despite havin   Reactive airways dysfunction syndrome ([place]) 07/19/2014   Restless leg syndrome 11/18/2017   S/P knee replacement 01/29/2014   Seasonal allergic rhinitis due to pollen 03/17/2019   Sinus bradycardia 12/07/2019   Sinus bradycardia by electrocardiogram 12/07/2019   Upper airway cough syndrome 08/04/2012   Followed in Pulmonary clinic/ [facility]/ [place]  -Kozlow eval around 2012 pos dust/ roach  Neg resp to singulair  - PFT's 02/2010 nl in effort dep portion of f/v loop, lung vol and dlcos also nl  - CT chest 05/27/12 wnl x small HH - 08/04/12   Alpha One AT >  MM - informed 09/10/2012 had not received demerol rx on  09/02/12  - sinus ct 10/23/2012 >>> Clear paranasal sinuses. - responded to neuronti   Vitamin B 12 deficiency    Vitamin D deficiency     Past Surgical History:  Procedure Laterality Date   ABDOMINAL HYSTERECTOMY     APPENDECTOMY     CHOLECYSTECTOMY     COLONOSCOPY  05/09/2015   Colonic Polyps, moderate sigmoid diverticulosis. Bx: Tubular Adenomas. Negative   DILATION AND CURETTAGE, DIAGNOSTIC / THERAPEUTIC     ESOPHAGEAL DILATION  08/2016   EYE SURGERY     cataracts removed / w IOL   GALLBLADDER SURGERY     HERNIA REPAIR     HIATAL HERNIA REPAIR  2/14   HIATAL HERNIA REPAIR  2014   revision of 2018 in [place] ARTHROSCOPY     both knees for torn cartilage    NERVE REPAIR Left 07/11/2017   Procedure: Excision of saphenous nerve left knee. Right knee intra-articular injection.;  Surgeon: Paralee Cancel, MD;  Location: [facility];  Service: Orthopedics;  Laterality: Left;  60 mins   OOPHORECTOMY     TONSILLECTOMY     TOTAL KNEE ARTHROPLASTY Left 01/29/2014   Procedure: LEFT TOTAL KNEE ARTHROPLASTY MCL REPAIR;  Surgeon: Augustin Schooling, MD;  Location: [place];  Service: Orthopedics;  Laterality: Left;   VESICOVAGINAL FISTULA CLOSURE W/ TAH      Current Medications: Current Meds  Medication Sig   acetaminophen (TYLENOL) 500 MG tablet Take 500 mg by mouth every 6 (six) hours as needed for mild pain.    aspirin EC 81 MG tablet Take 81 mg by mouth daily as needed for mild pain (Chest Pain). Swallow whole.   Cholecalciferol (VITAMIN D3) 50 MCG (2000 UT) TABS Take 1 tablet by mouth daily.   dicyclomine (BENTYL) 10 MG capsule Take 10 mg by mouth every 6 (six) hours as needed (GI issues).   diltiazem (CARDIZEM CD) 120 MG 24 hr capsule Take 1 capsule (120 mg total) by mouth daily.   escitalopram (LEXAPRO) 10 MG tablet Take 10 mg by mouth daily.   levothyroxine (SYNTHROID, LEVOTHROID) 88 MCG tablet Take 88 mcg by mouth daily before breakfast.   LORazepam (ATIVAN) 0.5 MG tablet Take 0.5  mg by mouth at bedtime.   Melatonin 5 MG TABS Take 6 mg by mouth daily.   methocarbamol (ROBAXIN) 500 MG tablet Take 500 mg by mouth daily as needed for muscle spasms.   promethazine (PHENERGAN) 12.5 MG tablet Take 12.5 mg by mouth every 6 (six) hours as needed for nausea or vomiting.   Turmeric (QC TUMERIC COMPLEX PO) Take 1,000 mcg by mouth daily.     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   Tobacco Use   Smoking status: Never   Smokeless tobacco: Never  Scientific laboratory technician Use: Never used  Substance and Sexual Activity   Alcohol use: No    Comment: rare for holiday consumption only   Drug use: No   Sexual activity: Not on file  Other Topics Concern   Not on file  Social History Narrative   Lives with husband   Retired   Investment banker, operational of Radio broadcast assistant Strain: Not on file  Food Insecurity: Not on file  Transportation Needs: Not on file  Physical Activity: Not on file  Stress: Not on file  Social Connections: Not on file     Family History: The patient's family history includes Arthritis in her brother, father, and mother; Asthma in her brother and mother; Breast cancer in her maternal grandmother and sister; Colon cancer in her mother; Emphysema in her father; Heart disease in her father, maternal grandmother, paternal aunt, and paternal grandmother. ROS:   Please see the history of present illness.    All 14 point review  of systems negative except as described per history of present illness  EKGs/Labs/Other Studies Reviewed:      Recent  Labs: No results found for requested labs within last 8760 hours.  Recent Lipid Panel No results found for: CHOL, TRIG, HDL, CHOLHDL, VLDL, LDLCALC, LDLDIRECT  Physical Exam:    VS:  BP 110/68 (BP Location: Left Arm, Patient Position: Sitting, Cuff Size: Normal)   Pulse 64   Ht 5\' 2"  (1.575 m)   Wt 140 lb 6.4 oz (63.7 kg)   SpO2 97%   BMI 25.68 kg/m     Wt Readings from Last 3 Encounters:  06/17/20 140 lb 6.4 oz (63.7 kg)  03/02/20 139 lb (63 kg)  12/11/19 136 lb (61.7 kg)     GEN:  Well nourished, well developed in no acute distress HEENT: Normal NECK: No JVD; No carotid bruits LYMPHATICS: No lymphadenopathy CARDIAC: RRR, no murmurs, no rubs, no gallops RESPIRATORY:  Clear to auscultation without rales, wheezing or rhonchi  ABDOMEN: Soft, non-tender, non-distended MUSCULOSKELETAL:  No edema; No deformity  SKIN: Warm and dry LOWER EXTREMITIES: no swelling NEUROLOGIC:  Alert and oriented x 3 PSYCHIATRIC:  Normal affect   ASSESSMENT:    1. Sinus bradycardia   2. SVT (supraventricular tachycardia) (HCC)   3. Laryngopharyngeal reflux (LPR)   4. Mixed hyperlipidemia    PLAN:    In order of problems listed above:  Sinus bradycardia no dizziness no passing out.  Monitor reviewed again showing no critical findings.  Continue present management Supraventricular tachycardia denies having a palpitation she is on calcium channel blocker which I will continue.  She is doing well from that aspect.  Next year we will repeat echocardiogram to make sure ejection fraction did not deteriorate and she does not developing tachycardia related cardiomyopathy Chronic cough.  She requested to be referred back to pulmonary.  Apparently she did see some pulmonary doctor 2 years ago however he retired and she does not see anybody would like to be seen. Dyslipidemia: I did review cholesterol done by primary care physician, LDL is 137 HDL 71.  I offered her medications however she refused.  She  said she did try some medication before making her feel miserable and she does not want to do anything about it.  We will continue that discussion   Medication Adjustments/Labs and Tests Ordered: Current medicines are reviewed at length with the patient today.  Concerns regarding medicines are outlined above.  No orders of the defined types were placed in this encounter.  Medication changes: No orders of the defined types were placed in this encounter.   Signed, Park Liter, MD, [facility] 06/17/2020 8:43 AM    [place]

## 2020-06-17 NOTE — Patient Instructions (Signed)
Medication Instructions:  Your physician recommends that you continue on your current medications as directed. Please refer to the Current Medication list given to you today.  *If you need a refill on your cardiac medications before your next appointment, please call your pharmacy*   Lab Work: NONE If you have labs (blood work) drawn today and your tests are completely normal, you will receive your results only by: [place] (if you have MyChart) OR A paper copy in the mail If you have any lab test that is abnormal or we need to change your treatment, we will call you to review the results.   Testing/Procedures: NONE   Follow-Up: At [facility], you and your health needs are our priority.  As part of our continuing mission to provide you with exceptional heart care, we have created designated Provider Care Teams.  These Care Teams include your primary Cardiologist (physician) and Advanced Practice Providers (APPs -  Physician Assistants and Nurse Practitioners) who all work together to provide you with the care you need, when you need it.  We recommend signing up for the patient portal called "MyChart".  Sign up information is provided on this After Visit Summary.  MyChart is used to connect with patients for Virtual Visits (Telemedicine).  Patients are able to view lab/test results, encounter notes, upcoming appointments, etc.  Non-urgent messages can be sent to your provider as well.   To learn more about what you can do with MyChart, go to [email or web address].    Your next appointment:   6 month(s)  The format for your next appointment:   In Person  Provider:   Jenne Campus, MD   Other Instructions Referral put in for Dr. Vaughan Browner

## 2020-07-06 ENCOUNTER — Ambulatory Visit: Payer: Medicare Other | Admitting: Allergy and Immunology

## 2020-08-01 ENCOUNTER — Encounter: Payer: Self-pay | Admitting: Allergy and Immunology

## 2020-08-01 ENCOUNTER — Ambulatory Visit (INDEPENDENT_AMBULATORY_CARE_PROVIDER_SITE_OTHER): Payer: Medicare Other | Admitting: Allergy and Immunology

## 2020-08-01 ENCOUNTER — Other Ambulatory Visit: Payer: Self-pay

## 2020-08-01 VITALS — BP 124/82 | HR 76 | Resp 16 | Ht 62.0 in | Wt 139.8 lb

## 2020-08-01 DIAGNOSIS — K219 Gastro-esophageal reflux disease without esophagitis: Secondary | ICD-10-CM | POA: Diagnosis not present

## 2020-08-01 DIAGNOSIS — R911 Solitary pulmonary nodule: Secondary | ICD-10-CM | POA: Diagnosis not present

## 2020-08-01 DIAGNOSIS — I2510 Atherosclerotic heart disease of native coronary artery without angina pectoris: Secondary | ICD-10-CM

## 2020-08-01 NOTE — Progress Notes (Signed)
[place] [facility] [place] [facility] [place]   Follow-up Note  Referring Provider: Raina Mina., MD Primary Provider: Raina Mina., MD Date of Office Visit: 08/01/2020  Subjective:   [name] (DOB: [birth year]) is a 75 y.o. female who returns to the [facility] [place] on 08/01/2020 in re-evaluation of the following:  HPI: [name] presents to this clinic in evaluation of cough.  Her last visit to this clinic was with Dr. Nelva Bush on 04 June 2017.  I have never seen her in this clinic.  She has a close to 10-year history of unrelenting cough with coughing fits associated with posttussive urination where she becomes completely worn out from coughing.  She has constant throat clearing and postnasal drip.  Most recently she was given an antibiotic and a steroid for this postnasal drip at a urgent care center 10 days ago without any change in her cough.  She has seen ENT and has seen pulmonary on multiple occasions.  6 months ago she apparently visited with our local ENT, Dr. Gaylyn Cheers,.  She has had CT scans which identified pulmonary nodules.  She has been treated for inflammation of her airway which has not helped her at all.  She has been treated for reflux which has not helped her at all.  She has had 2 "hiatal hernia" surgeries with her last one occurring 3 years ago.  She was given pantoprazole in the past but does not use this agent at this point and when she does develop indigestion she uses "gas relief" pills with a frequency of every other day.  She is a never smoker and does not have an extensive history of secondhand tobacco smoke exposure or particulate matter exposure.  She has received 3 COVID vaccines and was infected with COVID last month treated with Paxlovid without long-term sequela.  Allergies as of 08/01/2020       Reactions   Neurontin [gabapentin] Other (See Comments)   Severe falls,dizziness, forgetful    Ciprofloxacin Diarrhea   Codeine  Nausea And Vomiting   Eggs Or Egg-derived Products    GI upset per patient   Other Nausea And Vomiting   General anesthesia    Oxycodone Nausea And Vomiting   Tramadol    REACTION: GI discomfort with high doses   Cyclobenzaprine Palpitations   Sulfa Antibiotics Rash        Medication List   acetaminophen 500 MG tablet Commonly known as: TYLENOL Take 500 mg by mouth every 6 (six) hours as needed for mild pain.   aspirin EC 81 MG tablet Take 81 mg by mouth daily as needed for mild pain (Chest Pain). Swallow whole.   dicyclomine 10 MG capsule Commonly known as: BENTYL Take 10 mg by mouth every 6 (six) hours as needed (GI issues).   escitalopram 10 MG tablet Commonly known as: LEXAPRO Take 10 mg by mouth daily.   fluticasone 50 MCG/ACT nasal spray Commonly known as: FLONASE Place 2 sprays into both nostrils daily.   levothyroxine 88 MCG tablet Commonly known as: SYNTHROID Take 88 mcg by mouth daily before breakfast.   LORazepam 0.5 MG tablet Commonly known as: ATIVAN Take 0.5 mg by mouth at bedtime.   melatonin 5 MG Tabs Take 6 mg by mouth daily.   methocarbamol 500 MG tablet Commonly known as: ROBAXIN Take 500 mg by mouth daily as needed for muscle spasms.   promethazine 12.5 MG tablet Commonly known as: PHENERGAN Take 12.5 mg by  mouth every 6 (six) hours as needed for nausea or vomiting.   QC TUMERIC COMPLEX PO Take 1,000 mcg by mouth daily.   Vitamin D3 50 MCG (2000 UT) Tabs Take 1 tablet by mouth daily.        Past Medical History:  Diagnosis Date   Abnormal chest x-ray with multiple lung nodules 08/22/2017   Abnormal weight loss    Age-related osteoporosis without current pathological fracture 03/15/2015   Last Assessment & Plan:  Formatting of this note might be different from the original. Relevant Hx: Course: Daily Update: Today's Plan:discussed in depth with her and she is going to try the boniva when she gets back from her [place] trip, and she was  advised to increase her vitamin D 3 to 3000 IU daily after review of her lab and we reviewed her DEXA last year and she will be due for this in 2018  [place]   Alopecia    Anxiety    antianxiety med. usually at night but can take during the day for anxiety   Arthralgia of hip 10/26/2019   Arthritis    both knees & hands  also the back & neck   Asthma    Atrial fibrillation ([place])    pt. reports that [facility] that she had a "slight afib.", no further test needed (01/07/14 cardiology note does not mention any afib, just poor anterior r wave progression)   Cervical radiculopathy 01/19/2017   Chronic cough    Chronic rhinitis 12/04/2012   Followed in Pulmonary clinic/ [facility] [place]   - sinus ct 10/23/2012 >>> Clear paranasal sinuses. - trial of qid 1st gen H1 02/12/2013 > not effective [id number]        Complication of anesthesia    Coronary artery disease involving native coronary artery of native heart without angina pectoris 06/08/2016   Formatting of this note might be different from the original. No longer sees cardiolgist.  ST okay.  Imaging with calcium   Cough variant asthma 06/18/2013   - Methacholine challenge test 06/03/13 > equivocal but clinically caused tightness and made her cough - hfa 75% p coaching 06/17/13 > Trial of dulera 100 2bid started 06/18/2013     Degenerative cervical disc 06/08/2016   Depression    Diaphragmatic hernia    Dizziness 12/11/2019   Elbow tendonitis    Endometriosis    Gastric polyp    Gastroenteritis    GERD (gastroesophageal reflux disease)    Hiatal hernia 11/24/2013   High risk medication use 03/15/2015   History of colonic polyps 02/09/2019   History of hiatal hernia    pt. has been told that the hiatal hernia has reappeared   History of melanoma 06/17/2017   Hypercholesteremia    Hyperlipidemia    Hypertension    pt. reports that she use to take antihtn med, but reports since weight loss she has been able to come off.     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 Following with [facility].  [place].Neg PET scan   Malaise and fatigue 03/15/2015   Last Assessment & Plan:  Formatting of this note might be different from the original. 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 reports that its severe, but doesn't use it anymore, since weight loss.   Study done in [place], not sure where.    Osteoarthritis    Osteoporosis    PONV (postoperative nausea and vomiting)    Prediabetes 11/18/2019   Primary insomnia 03/15/2015   Primary osteoarthritis involving multiple joints 03/15/2015   Last Assessment & Plan:  Formatting of this note might be different from the original. 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   Restless leg syndrome 11/18/2017   S/P knee replacement 01/29/2014   Seasonal allergic rhinitis due to pollen 03/17/2019   Sinus bradycardia 12/07/2019   Sinus bradycardia by electrocardiogram 12/07/2019   Upper airway cough syndrome 08/04/2012   Followed in Pulmonary clinic/ [facility] [place]  -Swayze Kozuch eval around  2012 pos dust/ roach  Neg resp to singulair  - PFT's 02/2010 nl in effort dep portion of f/v loop, lung vol and dlcos also nl  - CT chest 05/27/12 wnl x small HH - 08/04/12   Alpha One AT >  MM - informed 09/10/2012 had not received demerol rx on 09/02/12  - sinus ct 10/23/2012 >>> Clear paranasal sinuses. - responded to neuronti   Vitamin B 12 deficiency    Vitamin D deficiency     Past Surgical History:  Procedure Laterality Date   ABDOMINAL HYSTERECTOMY     APPENDECTOMY     CHOLECYSTECTOMY     COLONOSCOPY  05/09/2015   Colonic Polyps, moderate sigmoid diverticulosis. Bx: Tubular Adenomas. Negative   DILATION AND CURETTAGE, DIAGNOSTIC / THERAPEUTIC     ESOPHAGEAL DILATION  08/2016   EYE SURGERY     cataracts removed / w [place]     HIATAL HERNIA REPAIR  2/14   HIATAL HERNIA REPAIR  2014   revision of 2018 in [place] ARTHROSCOPY     both knees for torn cartilage    NERVE REPAIR Left 07/11/2017   Procedure: Excision of saphenous nerve left knee. Right knee intra-articular injection.;  Surgeon: Paralee Cancel, MD;  Location: [facility];  Service: Orthopedics;  Laterality: Left;  60 mins   OOPHORECTOMY     TONSILLECTOMY     TOTAL KNEE ARTHROPLASTY Left 01/29/2014   Procedure: LEFT TOTAL KNEE ARTHROPLASTY MCL REPAIR;  Surgeon: Augustin Schooling, MD;  Location: [place] [facility];  Service: Orthopedics;  Laterality: Left;   VESICOVAGINAL FISTULA CLOSURE W/ TAH      Review of systems negative except as noted in HPI / PMHx or noted below:  Review of Systems  Constitutional: Negative.   HENT: Negative.    Eyes: Negative.   Respiratory: Negative.    Cardiovascular: Negative.   Gastrointestinal: Negative.   Genitourinary: Negative.   Musculoskeletal: Negative.   Skin: Negative.   Neurological: Negative.  Endo/Heme/Allergies: Negative.   Psychiatric/Behavioral: Negative.      Objective:   Vitals:   08/01/20 1326  BP: 124/82  Pulse: 76  Resp: 16  SpO2:  97%   Height: '5\' 2"'$  (157.5 cm)  Weight: 139 lb 12.8 oz (63.4 kg)   Physical Exam Constitutional:      Appearance: She is not diaphoretic.  HENT:     Head: Normocephalic.     Right Ear: Ear canal and external ear normal. Tympanic membrane is retracted (Retraction pocket).     Left Ear: Tympanic membrane, ear canal and external ear normal.     Nose: Nose normal. No mucosal edema or rhinorrhea.     Mouth/Throat:     Pharynx: Uvula midline. No oropharyngeal exudate.  Eyes:     Conjunctiva/sclera: Conjunctivae normal.  Neck:     Thyroid: No thyromegaly.     Trachea: Trachea normal. No tracheal tenderness or tracheal deviation.  Cardiovascular:     Rate and Rhythm: Normal rate and regular rhythm.     Heart sounds: Normal heart sounds, S1 normal and S2 normal. No murmur heard. Pulmonary:     Effort: No respiratory distress.     Breath sounds: Normal breath sounds. No stridor. No wheezing or rales.  Lymphadenopathy:     Head:     Right side of head: No tonsillar adenopathy.     Left side of head: No tonsillar adenopathy.     Cervical: No cervical adenopathy.  Skin:    Findings: No erythema or rash.     Nails: There is no clubbing.  Neurological:     Mental Status: She is alert.    Diagnostics:    Spirometry was performed and demonstrated an FEV1 of 1.47 at 75 % of predicted.  Results of a chest x-ray obtained 18 June 2019 identified the following:  The heart size and mediastinal contours are within normal limits.  Both lungs are clear. No evidence of pneumothorax or pleural  effusion. Surgical clips seen in the region of the GE junction, and  a probable small hiatal hernia is noted.    Results of a chest CT scan obtained 07 June 2017 identified multiple pulmonary nodules in the left lung including a 10 mm pulmonary nodule at the anterior medial aspect of the left lower lung and a single pulmonary nodule within the right lower lobe measuring 4 mm  Results of a chest PET scan  obtained 12 June 2017 identified no hypermetabolic areas.  Results of blood tests obtained 13 March 2017 identified no IgE antibodies directed against an area two aero allergen profile.  Results of blood tests obtained 18 May 2020 identified WBC 5.6, absolute eosinophil 100, absolute lymphocyte 900, hemoglobin 14.5, platelet 264  Assessment and Plan:   1. LPRD (laryngopharyngeal reflux disease)   2. Pulmonary nodule    1.  Obtain high-resolution chest CT scan in follow-up of pulmonary nodules  2.  Treat LPR:  A. Pantoprazole 40 mg - 1 tablet 2 times per day B. Famotidine 40 mg - 1 tablet in evening C. Minimize all caffeine and chocolate consumption  3. Can use the following if needed:  A.  Chlorpheniramine 4 mg -1 tablet every 6 hours B.  Mucinex DM -2 tablets every 12 hours  4.  Return to clinic in 4 weeks or earlier if problem  Dub Mikes has a history very consistent with LPR but against this diagnosis is the fact that she has had 2 surgeries for "hiatal hernia" and has  not responded to a proton pump inhibitor administered in the past regarding her respiratory tract symptoms.  We will have her aggressively treat LPR with the therapy noted above.  We will also further investigate her 10 mm left pulmonary nodule identified in 2019.  We will see her back in this clinic in 4 weeks or earlier if there is a problem.  Allena Katz, MD Allergy / Immunology [place]

## 2020-08-01 NOTE — Patient Instructions (Addendum)
  1.  Obtain high-resolution chest CT scan in follow-up of pulmonary nodules  2.  Treat LPR:  A. Pantoprazole 40 mg - 1 tablet 2 times per day B. Famotidine 40 mg - 1 tablet in evening C. Minimize all caffeine and chocolate consumption  3. Can use the following if needed:  A.  Chlorpheniramine 4 mg -1 tablet every 6 hours B.  Mucinex DM -2 tablets every 12 hours  4.  Return to clinic in 4 weeks or earlier if problem

## 2020-08-02 ENCOUNTER — Encounter: Payer: Self-pay | Admitting: Allergy and Immunology

## 2020-08-03 ENCOUNTER — Other Ambulatory Visit: Payer: Self-pay

## 2020-08-03 ENCOUNTER — Telehealth: Payer: Self-pay | Admitting: Allergy and Immunology

## 2020-08-03 MED ORDER — PANTOPRAZOLE SODIUM 40 MG PO TBEC: 40.0000 mg | DELAYED_RELEASE_TABLET | Freq: Two times a day (BID) | ORAL | 5 refills | Status: DC

## 2020-08-03 MED ORDER — FAMOTIDINE 40 MG PO TABS: 40.0000 mg | ORAL_TABLET | Freq: Every evening | ORAL | 5 refills | Status: DC

## 2020-08-03 NOTE — Telephone Encounter (Signed)
Prescriptions were sent in to [facility] on [address]. Left detailed message on voicemail to inform patient. Also informed her that we are working on getting the CT scheduled. I will be giving her a call back once I schedule the procedure with [facility].

## 2020-08-03 NOTE — Telephone Encounter (Signed)
High resolution CT scan has been scheduled at [facility]. Patient is schedule on 08/19/2020.Patient is to arrive at 10:30 for registration and appointment is at 11:00.   Patient was informed

## 2020-08-03 NOTE — Telephone Encounter (Signed)
Patient called and said her rx were not called in Pantoprazole '40mg'$ . And Famotidine '40mg'$  to [facility] [address][phone]. And also suppose to have a ct scan set up.

## 2020-08-11 NOTE — Addendum Note (Signed)
Addended by: Guy Franco on: 08/11/2020 04:33 PM   Modules accepted: Orders

## 2020-08-17 ENCOUNTER — Institutional Professional Consult (permissible substitution): Payer: Medicare Other | Admitting: Pulmonary Disease

## 2020-08-25 ENCOUNTER — Encounter: Payer: Self-pay | Admitting: *Deleted

## 2020-09-07 ENCOUNTER — Ambulatory Visit (INDEPENDENT_AMBULATORY_CARE_PROVIDER_SITE_OTHER): Payer: Medicare Other | Admitting: Allergy and Immunology

## 2020-09-07 ENCOUNTER — Encounter: Payer: Self-pay | Admitting: Allergy and Immunology

## 2020-09-07 ENCOUNTER — Other Ambulatory Visit: Payer: Self-pay

## 2020-09-07 VITALS — BP 122/66 | HR 82 | Resp 20

## 2020-09-07 DIAGNOSIS — J3089 Other allergic rhinitis: Secondary | ICD-10-CM | POA: Diagnosis not present

## 2020-09-07 DIAGNOSIS — I2510 Atherosclerotic heart disease of native coronary artery without angina pectoris: Secondary | ICD-10-CM | POA: Diagnosis not present

## 2020-09-07 DIAGNOSIS — R911 Solitary pulmonary nodule: Secondary | ICD-10-CM | POA: Diagnosis not present

## 2020-09-07 DIAGNOSIS — K219 Gastro-esophageal reflux disease without esophagitis: Secondary | ICD-10-CM | POA: Diagnosis not present

## 2020-09-07 NOTE — Progress Notes (Signed)
[facility] [place]   Follow-up Note  Referring Provider: Raina Mina., MD Primary Provider: Raina Mina., MD Date of Office Visit: 09/07/2020  Subjective:   [name] (DOB: 08/19/1945) is a 75 y.o. female who returns to the [facility] [place] on 09/07/2020 in re-evaluation of the following:  HPI: [name] returns to this clinic in evaluation of cough believed secondary to a component of LPR and a history of pulmonary nodules.  I last saw her in this clinic for my initial evaluation on 08 August 2020.  With focus on attention to the treatment of LPR she has done better.  She still has some coughing spells but they are not as intense and not as frequent.  Unfortunately, the pharmacist told her not to use her famotidine and she is only been relying on the use of pantoprazole twice a day.  She still consumes caffeine.  Her airway is doing well while using some Flonase.  Apparently she was hospitalized 3 weeks ago for an episode of dehydration associated with a gastroenteritis which apparently was norovirus which both she and her husband suffered from for about a week.  Allergies as of 09/07/2020       Reactions   Neurontin [gabapentin] Other (See Comments)   Severe falls,dizziness, forgetful    Ciprofloxacin Diarrhea   Codeine Nausea And Vomiting   Eggs Or Egg-derived Products    GI upset per patient   Other Nausea And Vomiting   General anesthesia    Oxycodone Nausea And Vomiting   Tramadol    REACTION: GI discomfort with high doses   Cyclobenzaprine Palpitations   Sulfa Antibiotics Rash        Medication List    acetaminophen 500 MG tablet Commonly known as: TYLENOL Take 500 mg by mouth every 6 (six) hours as needed for mild pain.   aspirin EC 81 MG tablet Take 81 mg by mouth daily as needed for mild pain (Chest Pain). Swallow whole.   dicyclomine 10 MG capsule Commonly known as: BENTYL Take 10 mg by mouth  every 6 (six) hours as needed (GI issues).   diltiazem 120 MG 24 hr capsule Commonly known as: TIAZAC Take by mouth.   escitalopram 10 MG tablet Commonly known as: LEXAPRO Take 10 mg by mouth daily.   famotidine 40 MG tablet Commonly known as: PEPCID Take 1 tablet (40 mg total) by mouth every evening.   fluticasone 50 MCG/ACT nasal spray Commonly known as: FLONASE Place 2 sprays into both nostrils daily.   levothyroxine 88 MCG tablet Commonly known as: SYNTHROID Take 88 mcg by mouth daily before breakfast.   LORazepam 0.5 MG tablet Commonly known as: ATIVAN Take 0.5 mg by mouth at bedtime.   melatonin 5 MG Tabs Take 6 mg by mouth daily.   methocarbamol 500 MG tablet Commonly known as: ROBAXIN Take 500 mg by mouth daily as needed for muscle spasms.   pantoprazole 40 MG tablet Commonly known as: Protonix Take 1 tablet (40 mg total) by mouth 2 (two) times daily.   promethazine 12.5 MG tablet Commonly known as: PHENERGAN Take 12.5 mg by mouth every 6 (six) hours as needed for nausea or vomiting.   QC TUMERIC COMPLEX PO Take 1,000 mcg by mouth daily.   Vitamin D3 50 MCG (2000 UT) Tabs Take 1 tablet by mouth daily.    Past Medical History:  Diagnosis Date   Abnormal chest x-ray with multiple lung nodules  08/22/2017   Abnormal weight loss    Age-related osteoporosis without current pathological fracture 03/15/2015   Last Assessment & Plan:  Formatting of this note might be different from the original. Relevant Hx: Course: Daily Update: Today's Plan:discussed in depth with her and she is going to try the boniva when she gets back from her [place] trip, and she was advised to increase her vitamin D 3 to 3000 IU daily after review of her lab and we reviewed her DEXA last year and she will be due for this in 2018  [place]   Alopecia    Anxiety    antianxiety med. usually at night but can take during the day for anxiety   Arthralgia of hip 10/26/2019   Arthritis    both knees &  hands  also the back & neck   Asthma    Atrial fibrillation ([place])    pt. reports that [facility] that she had a "slight afib.", no further test needed (01/07/14 cardiology note does not mention any afib, just poor anterior r wave progression)   Cervical radiculopathy 01/19/2017   Chronic cough    Chronic rhinitis 12/04/2012   Followed in Pulmonary clinic/ [facility] [place]   - sinus ct 10/23/2012 >>> Clear paranasal sinuses. - trial of qid 1st gen H1 02/12/2013 > not effective [id number]        Complication of anesthesia    Coronary artery disease involving native coronary artery of native heart without angina pectoris 06/08/2016   Formatting of this note might be different from the original. No longer sees cardiolgist.  ST okay.  Imaging with calcium   Cough variant asthma 06/18/2013   - Methacholine challenge test 06/03/13 > equivocal but clinically caused tightness and made her cough - hfa 75% p coaching 06/17/13 > Trial of dulera 100 2bid started 06/18/2013     Degenerative cervical disc 06/08/2016   Depression    Diaphragmatic hernia    Dizziness 12/11/2019   Elbow tendonitis    Endometriosis    Gastric polyp    Gastroenteritis    GERD (gastroesophageal reflux disease)    Hiatal hernia 11/24/2013   High risk medication use 03/15/2015   History of colonic polyps 02/09/2019   History of hiatal hernia    pt. has been told that the hiatal hernia has reappeared   History of melanoma 06/17/2017   Hypercholesteremia    Hyperlipidemia    Hypertension    pt. reports that she use to take antihtn med, but reports since weight loss she has been able to come off.     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 Following with [facility].  [place].Neg PET scan   Malaise and fatigue 03/15/2015   Last Assessment & Plan:  Formatting of  this note might be different from the original. 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 reports that its severe, but doesn't use it anymore, since weight loss.   Study done in [place], not sure where.    Osteoarthritis    Osteoporosis    PONV (postoperative nausea and vomiting)    Prediabetes 11/18/2019   Primary insomnia 03/15/2015   Primary osteoarthritis involving multiple joints 03/15/2015   Last Assessment & Plan:  Formatting of this note might be different from the original. 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   Restless leg syndrome 11/18/2017   S/P knee replacement 01/29/2014   Seasonal allergic rhinitis due to pollen 03/17/2019   Sinus bradycardia 12/07/2019   Sinus bradycardia by electrocardiogram 12/07/2019   Upper airway cough syndrome 08/04/2012   Followed in Pulmonary clinic/ [facility] [place]  -Lateefah Mallery eval around 2012 pos dust/ roach  Neg resp to singulair  - PFT's 02/2010 nl in effort dep portion of f/v loop, lung vol and dlcos also nl  - CT chest 05/27/12 wnl x small HH - 08/04/12   [facility] AT >  MM - informed 09/10/2012 had not received demerol rx on 09/02/12  - sinus ct 10/23/2012 >>> Clear paranasal sinuses. - responded to  neuronti   Vitamin B 12 deficiency    Vitamin D deficiency     Past Surgical History:  Procedure Laterality Date   ABDOMINAL HYSTERECTOMY     APPENDECTOMY     CHOLECYSTECTOMY     COLONOSCOPY  05/09/2015   Colonic Polyps, moderate sigmoid diverticulosis. Bx: Tubular Adenomas. Negative   DILATION AND CURETTAGE, DIAGNOSTIC / THERAPEUTIC     ESOPHAGEAL DILATION  08/2016   EYE SURGERY     cataracts removed / w [place]     HIATAL HERNIA REPAIR  2/14   HIATAL HERNIA REPAIR  2014   revision of 2018 in [place] ARTHROSCOPY     both knees for torn cartilage    NERVE REPAIR Left 07/11/2017   Procedure: Excision of saphenous nerve left knee. Right knee intra-articular injection.;  Surgeon: Paralee Cancel, MD;  Location: [facility];  Service: Orthopedics;  Laterality: Left;  60 mins   OOPHORECTOMY     TONSILLECTOMY     TOTAL KNEE ARTHROPLASTY Left 01/29/2014   Procedure: LEFT TOTAL KNEE ARTHROPLASTY MCL REPAIR;  Surgeon: Augustin Schooling, MD;  Location: [facility];  Service: Orthopedics;  Laterality: Left;   VESICOVAGINAL FISTULA CLOSURE W/ TAH      Review of systems negative except as noted in HPI / PMHx or noted below:  Review of Systems  Constitutional: Negative.   HENT: Negative.    Eyes: Negative.   Respiratory: Negative.    Cardiovascular: Negative.   Gastrointestinal: Negative.   Genitourinary: Negative.   Musculoskeletal: Negative.   Skin: Negative.   Neurological: Negative.   Endo/Heme/Allergies: Negative.   Psychiatric/Behavioral: Negative.      Objective:   Vitals:   09/07/20 1521  BP: 122/66  Pulse: 82  Resp: 20  SpO2: 98%          Physical Exam Constitutional:      Appearance: She is not diaphoretic.  HENT:     Head:  Normocephalic.     Right Ear: Tympanic membrane, ear canal and external ear normal.     Left Ear: Tympanic membrane, ear canal and external ear normal.     Nose: Nose normal. No mucosal edema or  rhinorrhea.     Mouth/Throat:     Pharynx: Uvula midline. No oropharyngeal exudate.  Eyes:     Conjunctiva/sclera: Conjunctivae normal.  Neck:     Thyroid: No thyromegaly.     Trachea: Trachea normal. No tracheal tenderness or tracheal deviation.  Cardiovascular:     Rate and Rhythm: Normal rate and regular rhythm.     Heart sounds: Normal heart sounds, S1 normal and S2 normal. No murmur heard. Pulmonary:     Effort: No respiratory distress.     Breath sounds: Normal breath sounds. No stridor. No wheezing or rales.  Lymphadenopathy:     Head:     Right side of head: No tonsillar adenopathy.     Left side of head: No tonsillar adenopathy.     Cervical: No cervical adenopathy.  Skin:    Findings: No erythema or rash.     Nails: There is no clubbing.  Neurological:     Mental Status: She is alert.    Diagnostics:    Results of a high-resolution chest CT scan obtained 19 August 2020 identified bilateral solid pulmonary nodules unchanged in size compared to 2019 exam.  Assessment and Plan:   1. LPRD (laryngopharyngeal reflux disease)   2. Pulmonary nodule   3. Perennial allergic rhinitis     1.  Treat LPR:  A. Pantoprazole 40 mg - 1 tablet 2 times per day B. Famotidine 40 mg - 1/2 tablet in evening C. Minimize all caffeine and chocolate consumption  2. Treat inflammation:  A. Flonase - 1-2 sprays each nostril 1 time per day  2. Can use the following if needed:  A.  Chlorpheniramine 4 mg -1 tablet every 6 hours B.  Mucinex DM -2 tablets every 12 hours  3.  Return to clinic in 8 weeks or earlier if problem  4. Plan for fall flu vaccine  Dub Mikes is doing better with her cough.  I would like for her to continue to aggressively treat her LPR with the use of a proton pump inhibitor twice a day and have her use 20 mg of famotidine in the evening while she still works on consolidating all caffeine and chocolate consumption.  I will see her back in this clinic in 8 weeks  which will be a total of 12 weeks of therapy and we will make a decision about further evaluation and treatment based upon her response.  Should be noted that she has had a thorough evaluation with ENT and pulmonary in the past regarding her cough and no therapy directed against respiratory tract inflammation including the use of various inhalers as ever helped her regarding cough.  It also appears as though her pulmonary nodules are a stable phenomenon over the course of the past 4 years and do not require any further evaluation at this point in time.  Allena Katz, MD Allergy / Immunology [place]

## 2020-09-07 NOTE — Patient Instructions (Addendum)
  1.  Treat LPR:  A. Pantoprazole 40 mg - 1 tablet 2 times per day B. Famotidine 40 mg - 1/2 tablet in evening C. Minimize all caffeine and chocolate consumption  2. Treat inflammation:  A. Flonase - 1-2 sprays each nostril 1 time per day  2. Can use the following if needed:  A.  Chlorpheniramine 4 mg -1 tablet every 6 hours B.  Mucinex DM -2 tablets every 12 hours  3.  Return to clinic in 8 weeks or earlier if problem  4. Plan for fall flu vaccine

## 2020-09-08 ENCOUNTER — Encounter: Payer: Self-pay | Admitting: Allergy and Immunology

## 2020-09-09 ENCOUNTER — Ambulatory Visit (INDEPENDENT_AMBULATORY_CARE_PROVIDER_SITE_OTHER): Payer: Medicare Other | Admitting: Pulmonary Disease

## 2020-09-09 ENCOUNTER — Other Ambulatory Visit: Payer: Self-pay

## 2020-09-09 ENCOUNTER — Encounter: Payer: Self-pay | Admitting: Pulmonary Disease

## 2020-09-09 VITALS — BP 118/64 | HR 80 | Temp 97.9°F | Ht 62.5 in | Wt 137.0 lb

## 2020-09-09 DIAGNOSIS — R059 Cough, unspecified: Secondary | ICD-10-CM | POA: Diagnosis not present

## 2020-09-09 DIAGNOSIS — I2510 Atherosclerotic heart disease of native coronary artery without angina pectoris: Secondary | ICD-10-CM | POA: Diagnosis not present

## 2020-09-09 LAB — CBC WITH DIFFERENTIAL/PLATELET
Basophils Absolute: 0 10*3/uL (ref 0.0–0.1)
Basophils Relative: 0.4 % (ref 0.0–3.0)
Eosinophils Absolute: 0.1 10*3/uL (ref 0.0–0.7)
Eosinophils Relative: 1.8 % (ref 0.0–5.0)
HCT: 40.8 % (ref 36.0–46.0)
Hemoglobin: 13.4 g/dL (ref 12.0–15.0)
Lymphocytes Relative: 19.3 % (ref 12.0–46.0)
Lymphs Abs: 1.2 10*3/uL (ref 0.7–4.0)
MCHC: 32.9 g/dL (ref 30.0–36.0)
MCV: 94.2 fl (ref 78.0–100.0)
Monocytes Absolute: 0.6 10*3/uL (ref 0.1–1.0)
Monocytes Relative: 8.8 % (ref 3.0–12.0)
Neutro Abs: 4.4 10*3/uL (ref 1.4–7.7)
Neutrophils Relative %: 69.7 % (ref 43.0–77.0)
Platelets: 260 10*3/uL (ref 150.0–400.0)
RBC: 4.33 Mil/uL (ref 3.87–5.11)
RDW: 15.4 % (ref 11.5–15.5)
WBC: 6.3 10*3/uL (ref 4.0–10.5)

## 2020-09-09 NOTE — Patient Instructions (Signed)
We will check some labs today including CBC differential, IgE Continue the antiacid therapy and antihistamines as prescribed by Dr. Neldon Mc We will schedule pulmonary function test for better evaluation of your lung  Follow-up in 3 months.

## 2020-09-09 NOTE — Progress Notes (Signed)
[name]    CS:[id number]    03-05-1945  Primary Care Physician:Grisso, Clarita Crane., MD  Referring Physician: Park Liter, MD [address] [place],  [place] [address]  Chief complaint: Consult for chronic cough  HPI: 75 year old with chronic cough likely secondary to LPR, GERD, hiatal hernia, postnasal drip.  Previously followed by Dr. Lenna Gilford.  Was told she had asthma in the past and was tried on multiple inhalers which did not help  Complains of chronic cough, nonproductive in nature.  No associated dyspnea, sputum production, fevers, chills She has seen Dr. Neldon Mc who has put her on double antiacid therapy with PPI and H2 blocker, chlorpheniramine and Flonase for postnasal drip.  She does note some improvement in her cough.  She developed COVID-19 in July 2022 treated with Paxlovid with as an outpatient Recent high-resolution CT for follow-up of lung nodules showed stable lung nodules with no clear evidence of interstitial lung disease Per notes she has had extensive ENT evaluation without any abnormality noted  Pets: Dogs, outside cat and horse Occupation: Microbiologist.  Works part-time Exposures: No mold, hot tub, Customer service manager.  She does have down comforter Smoking history: Never smoker Travel history: Originally from [place].  No significant travel history Relevant family history: Brother and mother had asthma.    Outpatient Encounter Medications as of 09/09/2020  Medication Sig   acetaminophen (TYLENOL) 500 MG tablet Take 500 mg by mouth every 6 (six) hours as needed for mild pain.    aspirin EC 81 MG tablet Take 81 mg by mouth daily as needed for mild pain (Chest Pain). Swallow whole.   Cholecalciferol (VITAMIN D3) 50 MCG (2000 UT) TABS Take 1 tablet by mouth daily.   diltiazem (TIAZAC) 120 MG 24 hr capsule Take by mouth.   escitalopram (LEXAPRO) 10 MG tablet Take 10 mg by mouth daily.   famotidine (PEPCID) 40 MG tablet Take 1 tablet (40 mg total) by mouth every  evening.   fluticasone (FLONASE) 50 MCG/ACT nasal spray Place 2 sprays into both nostrils daily.   levothyroxine (SYNTHROID, LEVOTHROID) 88 MCG tablet Take 88 mcg by mouth daily before breakfast.   LORazepam (ATIVAN) 0.5 MG tablet Take 0.5 mg by mouth at bedtime.   Melatonin 5 MG TABS Take 6 mg by mouth daily.   methocarbamol (ROBAXIN) 500 MG tablet Take 500 mg by mouth daily as needed for muscle spasms.   pantoprazole (PROTONIX) 40 MG tablet Take 1 tablet (40 mg total) by mouth 2 (two) times daily.   promethazine (PHENERGAN) 12.5 MG tablet Take 12.5 mg by mouth every 6 (six) hours as needed for nausea or vomiting.   Turmeric (QC TUMERIC COMPLEX PO) Take 1,000 mcg by mouth daily.   dicyclomine (BENTYL) 10 MG capsule Take 10 mg by mouth every 6 (six) hours as needed (GI issues). (Patient not taking: Reported on 09/09/2020)   No facility-administered encounter medications on file as of 09/09/2020.    Allergies as of 09/09/2020 - Review Complete 09/09/2020  Allergen Reaction Noted   Neurontin [gabapentin] Other (See Comments) 08/04/2013   Ciprofloxacin Diarrhea 05/03/2016   Codeine Nausea And Vomiting 08/04/2012   Eggs or egg-derived products  01/15/2019   Other Nausea And Vomiting 07/01/2017   Oxycodone Nausea And Vomiting 01/19/2014   Tramadol  03/25/2013   Cyclobenzaprine Palpitations 03/25/2013   Sulfa antibiotics Rash 08/04/2012    Past Medical History:  Diagnosis Date   Abnormal chest x-ray with multiple lung nodules  08/22/2017   Abnormal weight loss    Age-related osteoporosis without current pathological fracture 03/15/2015   Last Assessment & Plan:  Formatting of this note might be different from the original. Relevant Hx: Course: Daily Update: Today's Plan:discussed in depth with her and she is going to try the boniva when she gets back from her [place] trip, and she was advised to increase her vitamin D 3 to 3000 IU daily after review of her lab and we reviewed her DEXA last year and  she will be due for this in 2018  [place]   Alopecia    Anxiety    antianxiety med. usually at night but can take during the day for anxiety   Arthralgia of hip 10/26/2019   Arthritis    both knees & hands  also the back & neck   Asthma    Atrial fibrillation ([place])    pt. reports that [facility] that she had a "slight afib.", no further test needed (01/07/14 cardiology note does not mention any afib, just poor anterior r wave progression)   Cervical radiculopathy 01/19/2017   Chronic cough    Chronic rhinitis 12/04/2012   Followed in Pulmonary clinic/ [facility]/ [place]   - sinus ct 10/23/2012 >>> Clear paranasal sinuses. - trial of qid 1st gen H1 02/12/2013 > not effective [id number]        Complication of anesthesia    Coronary artery disease involving native coronary artery of native heart without angina pectoris 06/08/2016   Formatting of this note might be different from the original. No longer sees cardiolgist.  ST okay.  Imaging with calcium   Cough variant asthma 06/18/2013   - Methacholine challenge test 06/03/13 > equivocal but clinically caused tightness and made her cough - hfa 75% p coaching 06/17/13 > Trial of dulera 100 2bid started 06/18/2013     Degenerative cervical disc 06/08/2016   Depression    Diaphragmatic hernia    Dizziness 12/11/2019   Elbow tendonitis    Endometriosis    Gastric polyp    Gastroenteritis    GERD (gastroesophageal reflux disease)    Hiatal hernia 11/24/2013   High risk medication use 03/15/2015   History of colonic polyps 02/09/2019   History of hiatal hernia    pt. has been told that the hiatal hernia has reappeared   History of melanoma 06/17/2017   Hypercholesteremia    Hyperlipidemia    Hypertension    pt. reports that she use to take antihtn med, but reports since weight loss she has been able to come off.     Hypothyroidism    Insomnia    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   Laryngopharyngeal reflux (LPR) 07/19/2014   Lung nodules  09/16/2017   Formatting of this note might be different from the original. Following with [facility].  [place].Neg PET scan   Malaise and fatigue 03/15/2015   Last Assessment & Plan:  Formatting of this note might be different from the original. 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 reports that its severe, but doesn't use it anymore, since weight loss.   Study done in [place], not sure where.    Osteoarthritis    Osteoporosis    PONV (postoperative nausea and vomiting)    Prediabetes 11/18/2019   Primary insomnia 03/15/2015   Primary osteoarthritis involving multiple joints 03/15/2015   Last Assessment & Plan:  Formatting of this note might be different from the original. 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   Restless leg syndrome 11/18/2017   S/P knee replacement 01/29/2014   Seasonal allergic rhinitis due to pollen 03/17/2019   Sinus bradycardia 12/07/2019   Sinus bradycardia by electrocardiogram 12/07/2019   Upper airway cough syndrome 08/04/2012   Followed in Pulmonary clinic/ [facility]/ [facility]  -[place] eval around 2012 pos dust/ roach  Neg resp to singulair  - PFT's 02/2010 nl in effort dep portion of f/v loop, lung vol and  dlcos also nl  - CT chest 05/27/12 wnl x small HH - 08/04/12   Alpha One AT >  MM - informed 09/10/2012 had not received demerol rx on 09/02/12  - sinus ct 10/23/2012 >>> Clear paranasal sinuses. - responded to neuronti   Vitamin B 12 deficiency    Vitamin D deficiency     Past Surgical History:  Procedure Laterality Date   ABDOMINAL HYSTERECTOMY     APPENDECTOMY     CHOLECYSTECTOMY     COLONOSCOPY  05/09/2015   Colonic Polyps, moderate sigmoid diverticulosis. Bx: Tubular Adenomas. Negative   DILATION AND CURETTAGE, DIAGNOSTIC / THERAPEUTIC     ESOPHAGEAL DILATION  08/2016   EYE SURGERY     cataracts removed / w [place]     HIATAL HERNIA REPAIR  2/14   HIATAL HERNIA REPAIR  2014   revision of 2018 in [place] ARTHROSCOPY     both knees for torn cartilage    NERVE REPAIR Left 07/11/2017   Procedure: Excision of saphenous nerve left knee. Right knee intra-articular injection.;  Surgeon: Paralee Cancel, MD;  Location: [facility];  Service: Orthopedics;  Laterality: Left;  60 mins   OOPHORECTOMY     TONSILLECTOMY     TOTAL KNEE ARTHROPLASTY Left 01/29/2014   Procedure: LEFT TOTAL KNEE ARTHROPLASTY MCL REPAIR;  Surgeon: Augustin Schooling, MD;  Location: [facility];  Service: Orthopedics;  Laterality: Left;   VESICOVAGINAL FISTULA CLOSURE W/ TAH      Family History  Problem Relation Age of Onset   Emphysema Father        smoked   Heart disease Father    Arthritis Father    Asthma Mother    Colon cancer Mother    Arthritis Mother    Asthma Brother    Arthritis Brother    Breast cancer Sister    Breast cancer Maternal Grandmother    Heart disease Maternal Grandmother    Heart disease Paternal Grandmother    Heart disease Paternal Aunt     Social History   Socioeconomic History   Marital status: Married    Spouse name: Not on file   Number of children: Not on file  Years of education: Not on file   Highest education level: Not on file   Occupational History   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   Tobacco Use   Smoking status: Never   Smokeless tobacco: Never  Vaping Use   Vaping Use: Never used  Substance and Sexual Activity   Alcohol use: No    Comment: rare for holiday consumption only   Drug use: No   Sexual activity: Not on file  Other Topics Concern   Not on file  Social History Narrative   Lives with husband   Retired   Investment banker, operational of Radio broadcast assistant Strain: Not on file  Food Insecurity: Not on file  Transportation Needs: Not on file  Physical Activity: Not on file  Stress: Not on file  Social Connections: Not on file  Intimate Partner Violence: Not on file    Review of systems: Review of Systems  Constitutional: Negative for fever and chills.  HENT: Negative.   Eyes: Negative for blurred vision.  Respiratory: as per HPI  Cardiovascular: Negative for chest pain and palpitations.  Gastrointestinal: Negative for vomiting, diarrhea, blood per rectum. Genitourinary: Negative for dysuria, urgency, frequency and hematuria.  Musculoskeletal: Negative for myalgias, back pain and joint pain.  Skin: Negative for itching and rash.  Neurological: Negative for dizziness, tremors, focal weakness, seizures and loss of consciousness.  Endo/Heme/Allergies: Negative for environmental allergies.  Psychiatric/Behavioral: Negative for depression, suicidal ideas and hallucinations.  All other systems reviewed and are negative.  Physical Exam: Blood pressure 118/64, pulse 80, temperature 97.9 F (36.6 C), temperature source Oral, height 5' 2.5" (1.588 m), weight 137 lb (62.1 kg), SpO2 99 %. Gen:      No acute distress HEENT:  EOMI, sclera anicteric Neck:     No masses; no thyromegaly Lungs:    Clear to auscultation bilaterally; normal respiratory effort  CV:         Regular rate and rhythm; no murmurs Abd:      + bowel sounds; soft, non-tender; no palpable  masses, no distension Ext:    No edema; adequate peripheral perfusion Skin:      Warm and dry; no rash Neuro: alert and oriented x 3 Psych: normal mood and affect  Data Reviewed: Imaging: High-resolution CT 8/90/22-no lung nodules, no significant interstitial lung disease I have reviewed the images personally.  PFTs:  Labs:  Assessment:  Consult for cough Likely secondary to ongoing LPR, GERD in the setting of hiatal hernia, postnasal drip Symptoms appear to have improved with current therapy of PPI, H2 blocker and first-generation antihistamine, Flonase This possible history of asthma but she has not responded to inhalers in the past and is not interested in retrying  We will get baseline CBC differential, IgE and PFTs for better evaluation  Concern for ILD Although high-res CT is read as normal by my read there may be subtle groundglass changes.  She does have exposure to down pillows Get Hypersensitivity panel and I will review her scan at our multidisciplinary ILD conference to get the opinion of the chest radiologist  Plan/Recommendations: CBC differential, IgE, PFTs Hypersensitivity panel labs Review scan at multidisciplinary conference   Marshell Garfinkel MD [facility] 09/09/2020, 2:16 PM  CC: Park Liter, MD

## 2020-09-12 LAB — IGE: IgE (Immunoglobulin E), Serum: 75 kU/L (ref <=114)

## 2020-09-15 ENCOUNTER — Encounter: Payer: Self-pay | Admitting: Pulmonary Disease

## 2020-09-16 LAB — HYPERSENSITIVITY PNEUMONITIS
A. Pullulans Abs: NEGATIVE
A.Fumigatus #1 Abs: NEGATIVE
Micropolyspora faeni, IgG: NEGATIVE
Pigeon Serum Abs: NEGATIVE
Thermoact. Saccharii: NEGATIVE
Thermoactinomyces vulgaris, IgG: NEGATIVE

## 2020-09-19 DIAGNOSIS — M545 Low back pain, unspecified: Secondary | ICD-10-CM

## 2020-09-19 HISTORY — DX: Low back pain, unspecified: M54.50

## 2020-09-25 ENCOUNTER — Other Ambulatory Visit: Payer: Self-pay | Admitting: Cardiology

## 2020-10-11 ENCOUNTER — Ambulatory Visit: Payer: Self-pay | Admitting: Pulmonary Disease

## 2020-10-11 DIAGNOSIS — R059 Cough, unspecified: Secondary | ICD-10-CM | POA: Diagnosis not present

## 2020-11-14 ENCOUNTER — Ambulatory Visit: Payer: Medicare Other | Admitting: Allergy and Immunology

## 2020-11-17 NOTE — Progress Notes (Signed)
   Interstitial Lung Disease Multidisciplinary Conference   [name]    MRN [id number]    DOB 06-20-1945  Primary Care Physician:Grisso, Clarita Crane., MD  Referring Physician: Marshell Garfinkel MD  Time of Conference: 7.30am- 8.30am Date of conference: 10/11/2020 Location of Conference: -  Virtual  Participating Pulmonary: Dr. Brand Males, MD,  Dr Marshell Garfinkel, MD Pathology: Dr Jaquita Folds, MD Radiology: Dr Vinnie Langton MD Others:   Brief History:  75 Y/O with chronic cough likely secondary to LPR, GERD, hiatal hernia, postnasal drip. She developed COVID-19 in July 2022 and was treated with Paxlovid as an outpatient.   Please review high-res CT.  May have subtle ground glass, post-COVID changes.  Serology:   MDD discussion of CT scan   CT high-resolution 08/19/20 Very subtle findings at the base suggestive of atelectasis versus groundglass.  In addition the right lung nodules are   MDD Impression/Recs:  Unclear if there is significant ILD.  May represent Addison syndrome in the right clinical setting.  Advise monitoring with follow-up CT   Time Spent in preparation and discussion:  > 30 min    SIGNATURE   Marshell Garfinkel MD [facility]

## 2021-01-13 ENCOUNTER — Ambulatory Visit (INDEPENDENT_AMBULATORY_CARE_PROVIDER_SITE_OTHER): Payer: Medicare Other | Admitting: Cardiology

## 2021-01-13 ENCOUNTER — Encounter: Payer: Self-pay | Admitting: Cardiology

## 2021-01-13 ENCOUNTER — Ambulatory Visit (INDEPENDENT_AMBULATORY_CARE_PROVIDER_SITE_OTHER): Payer: Medicare Other

## 2021-01-13 ENCOUNTER — Other Ambulatory Visit: Payer: Self-pay

## 2021-01-13 VITALS — BP 130/70 | HR 75 | Ht 62.5 in | Wt 141.0 lb

## 2021-01-13 DIAGNOSIS — R001 Bradycardia, unspecified: Secondary | ICD-10-CM

## 2021-01-13 DIAGNOSIS — Z8719 Personal history of other diseases of the digestive system: Secondary | ICD-10-CM

## 2021-01-13 DIAGNOSIS — I471 Supraventricular tachycardia: Secondary | ICD-10-CM

## 2021-01-13 NOTE — Progress Notes (Signed)
Cardiology Office Note:    Date:  01/13/2021   ID:  [name], DOB Dec 09, 1945, MRN [id number]  PCP:  Raina Mina., MD  Cardiologist:  Jenne Campus, MD    Referring MD: Raina Mina., MD   No chief complaint on file. I am doing fine  History of Present Illness:    [name] is a 76 y.o. female with past medical history significant for supraventricular tachycardia, sinus bradycardia, dizziness, depression, vertigo, dyslipidemia, essential hypertension.  She comes today to my office for follow-up.  Overall she says she is doing well cardiac wise, complaining of the fact that she had flu about 5 weeks ago after that she suffered from laryngitis gradually things are getting better still complain of having weakness and fatigue.  Therefore is difficult to distinguish if the symptoms are simply related to recovery from flu or some potential cardiac issues.  She described to have some fatigue tiredness shortness of breath but no chest pain tightness squeezing pressure burning chest no passing out  Past Medical History:  Diagnosis Date   Abnormal chest x-ray with multiple lung nodules 08/22/2017   Abnormal weight loss    Age-related osteoporosis without current pathological fracture 03/15/2015   Last Assessment & Plan:  Formatting of this note might be different from the original. Relevant Hx: Course: Daily Update: Today's Plan:discussed in depth with her and she is going to try the boniva when she gets back from her [place] trip, and she was advised to increase her vitamin D 3 to 3000 IU daily after review of her lab and we reviewed her DEXA last year and she will be due for this in 2018  [place]   Alopecia    Anxiety    antianxiety med. usually at night but can take during the day for anxiety   Arthralgia of hip 10/26/2019   Arthritis    both knees & hands  also the back & neck   Asthma    Atrial fibrillation ([place])    pt. reports that [facility] that she had a "slight afib.", no further  test needed (01/07/14 cardiology note does not mention any afib, just poor anterior r wave progression)   Cervical radiculopathy 01/19/2017   Chronic cough    Chronic rhinitis 12/04/2012   Followed in Pulmonary clinic/ [facility]/ [place]   - sinus ct 10/23/2012 >>> Clear paranasal sinuses. - trial of qid 1st gen H1 02/12/2013 > not effective 06/08/35        Complication of anesthesia    Coronary artery disease involving native coronary artery of native heart without angina pectoris 06/08/2016   Formatting of this note might be different from the original. No longer sees cardiolgist.  ST okay.  Imaging with calcium   Cough variant asthma 06/18/2013   - Methacholine challenge test 06/03/13 > equivocal but clinically caused tightness and made her cough - hfa 75% p coaching 06/17/13 > Trial of dulera 100 2bid started 06/18/2013     Degenerative cervical disc 06/08/2016   Depression    Diaphragmatic hernia    Dizziness 12/11/2019   Elbow tendonitis    Endometriosis    Gastric polyp    Gastroenteritis    GERD (gastroesophageal reflux disease)    Hiatal hernia 11/24/2013   High risk medication use 03/15/2015   History of colonic polyps 02/09/2019   History of hiatal hernia    pt. has been told that the hiatal hernia has reappeared   History of melanoma 06/17/2017  Hypercholesteremia    Hyperlipidemia    Hypertension    pt. reports that she use to take antihtn med, but reports since weight loss she has been able to come off.     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 Following with [facility].  [place].Neg PET scan   Malaise and fatigue 03/15/2015   Last Assessment & Plan:  Formatting of this note might be different from the original. Relevant Hx: Course: Daily Update: Today's Plan:she has some days better than others for her  and she admits that with her allergies and asthma at times she is more tired as well as with the OA she has that creates some difficulty  Electronically signed by: Mayer Camel, NP 04/04/15 1058   Melanoma ([place]) 2011   heel   Mixed hyperlipidemia 03/15/2015   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   OSA (obstructive sleep apnea)    does not tolerate CPAP , pt. reports that its severe, but doesn't use it anymore, since weight loss.   Study done in [place], not sure where.    Osteoarthritis    Osteoporosis    PONV (postoperative nausea and vomiting)    Prediabetes 11/18/2019   Primary insomnia 03/15/2015   Primary osteoarthritis involving multiple joints 03/15/2015   Last Assessment & Plan:  Formatting of this note might be different from the original. Relevant Hx: Course: Daily Update: Today's Plan:her xrays we reviewed today showed she had multiple degenerative areas to her tspine and her LSpine with the old compression deformities and on review she had suffered a serious fall in the 1980's which was likely the source of this completely for her despite havin   Reactive airways dysfunction syndrome ([place]) 07/19/2014   Restless leg syndrome 11/18/2017   S/P knee replacement 01/29/2014   Seasonal allergic rhinitis due to pollen 03/17/2019   Sinus bradycardia 12/07/2019   Sinus bradycardia by electrocardiogram 12/07/2019   Upper airway cough syndrome 08/04/2012   Followed in Pulmonary clinic/ [facility]/ [place]  -[place] eval around 2012 pos dust/ roach  Neg resp to singulair  - PFT's 02/2010 nl in effort dep portion of f/v loop, lung vol and dlcos also nl  - CT chest 05/27/12 wnl x small HH - 08/04/12   Alpha One AT >  MM - informed 09/10/2012 had not received demerol rx on 09/02/12  - sinus ct 10/23/2012 >>> Clear paranasal sinuses. - responded to neuronti   Vitamin B 12 deficiency    Vitamin D deficiency     Past Surgical History:  Procedure Laterality Date   ABDOMINAL HYSTERECTOMY      APPENDECTOMY     CHOLECYSTECTOMY     COLONOSCOPY  05/09/2015   Colonic Polyps, moderate sigmoid diverticulosis. Bx: Tubular Adenomas. Negative   DILATION AND CURETTAGE, DIAGNOSTIC / THERAPEUTIC     ESOPHAGEAL DILATION  08/2016   EYE SURGERY     cataracts removed / w [place]     HIATAL HERNIA REPAIR  2/14   HIATAL HERNIA REPAIR  2014   revision of 2018 in [place] ARTHROSCOPY     both knees for torn cartilage    NERVE REPAIR Left 07/11/2017   Procedure: Excision of saphenous nerve left knee. Right knee intra-articular injection.;  Surgeon:  Paralee Cancel, MD;  Location: [facility];  Service: Orthopedics;  Laterality: Left;  60 mins   OOPHORECTOMY     TONSILLECTOMY     TOTAL KNEE ARTHROPLASTY Left 01/29/2014   Procedure: LEFT TOTAL KNEE ARTHROPLASTY MCL REPAIR;  Surgeon: Augustin Schooling, MD;  Location: [facility];  Service: Orthopedics;  Laterality: Left;   VESICOVAGINAL FISTULA CLOSURE W/ TAH      Current Medications: No outpatient medications have been marked as taking for the 01/13/21 encounter (Office Visit) with Park Liter, MD.     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   Tobacco Use   Smoking status: Never   Smokeless tobacco: Never  Scientific laboratory technician Use: Never used  Substance and Sexual Activity   Alcohol use: No    Comment: rare for holiday consumption only   Drug use: No   Sexual activity: Not on file  Other Topics Concern   Not on file  Social History Narrative   Lives with husband   Retired   Animal nutritionist of Radio broadcast assistant Strain: Not on file  Food Insecurity: Not on file  Transportation Needs: Not on file  Physical Activity: Not on file  Stress: Not on file  Social Connections: Not on file     Family History: The patient's family history includes Arthritis in her brother, father, and mother; Asthma in her brother and mother; Breast cancer in her maternal grandmother and sister; Colon cancer in her mother; Emphysema in her father; Heart disease in her father, maternal grandmother, paternal aunt, and paternal grandmother. ROS:   Please see the history of present illness.    All 14 point review of systems negative except as described per history of present illness  EKGs/Labs/Other Studies Reviewed:      Recent Labs: 09/09/2020: Hemoglobin 13.4; Platelets 260.0  Recent Lipid Panel No results found for: CHOL, TRIG, HDL, CHOLHDL, VLDL, LDLCALC, LDLDIRECT  Physical Exam:    VS:  BP 130/70 (BP Location: Left Arm, Patient Position: Sitting, Cuff Size: Normal)    Pulse 75    Ht 5' 2.5" (1.588 m)    Wt 141 lb (64 kg)    SpO2 96%    BMI 25.38 kg/m     Wt Readings from Last 3 Encounters:  01/13/21 141 lb (64 kg)  09/09/20 137 lb (62.1 kg)  08/01/20 139 lb 12.8 oz (63.4 kg)     GEN:  Well nourished, well developed in no acute distress HEENT: Normal NECK: No JVD; No carotid bruits LYMPHATICS: No lymphadenopathy CARDIAC: RRR, no murmurs, no rubs, no gallops RESPIRATORY:  Clear to auscultation without rales, wheezing or rhonchi  ABDOMEN: Soft, non-tender, non-distended MUSCULOSKELETAL:  No edema; No deformity  SKIN: Warm and dry LOWER EXTREMITIES: no swelling NEUROLOGIC:  Alert and oriented x 3 PSYCHIATRIC:  Normal affect   ASSESSMENT:    1. Sinus bradycardia   2. SVT (supraventricular tachycardia) ([place])   3. History of hiatal hernia    PLAN:    In order of problems listed above:  Sinus bradycardia.  I will ask him to wear another monitor to make sure  there is no significant bradycardia.  She does have history of supraventricular tachycardia however discontinue diltiazem because she felt horrible with it therefore it will be valuable to assess the burden of arrhythmia to decide about therapy.  As a part of evaluation she also will have an echocardiogram to assess left ventricle ejection fraction. Dyslipidemia, I did review her K PN which show me her LDL of 156 from a month ago we initiated conversation about taking some medication for which she is reluctant she wants to recover from her fluid before changing any of her medications. Essential hypertension, blood pressure seems to be well controlled.   Medication Adjustments/Labs and Tests Ordered: Current medicines are reviewed at length with the patient today.  Concerns regarding medicines are outlined above.  No orders of the defined types were placed in this encounter.  Medication changes: No orders of the defined types were placed in this encounter.   Signed, Park Liter, MD, [facility] 01/13/2021 10:58 AM    [place]

## 2021-01-13 NOTE — Patient Instructions (Signed)
Medication Instructions:  Your physician recommends that you continue on your current medications as directed. Please refer to the Current Medication list given to you today.  *If you need a refill on your cardiac medications before your next appointment, please call your pharmacy*   Lab Work: None If you have labs (blood work) drawn today and your tests are completely normal, you will receive your results only by: [place] (if you have MyChart) OR A paper copy in the mail If you have any lab test that is abnormal or we need to change your treatment, we will call you to review the results.   Testing/Procedures: Your physician has requested that you have an echocardiogram. Echocardiography is a painless test that uses sound waves to create images of your heart. It provides your doctor with information about the size and shape of your heart and how well your hearts chambers and valves are working. This procedure takes approximately one hour. There are no restrictions for this procedure.  A zio monitor was ordered today. It will remain on for 14 days. You will then return monitor and event diary in provided box. It takes 1-2 weeks for report to be downloaded and returned to [place]. We will call you with the results. If monitor falls off or has orange flashing light, please call Zio for further instructions.     Follow-Up: At [facility], you and your health needs are our priority.  As part of our continuing mission to provide you with exceptional heart care, we have created designated Provider Care Teams.  These Care Teams include your primary Cardiologist (physician) and Advanced Practice Providers (APPs -  Physician Assistants and Nurse Practitioners) who all work together to provide you with the care you need, when you need it.  We recommend signing up for the patient portal called "MyChart".  Sign up information is provided on this After Visit Summary.  MyChart is used to connect with  patients for Virtual Visits (Telemedicine).  Patients are able to view lab/test results, encounter notes, upcoming appointments, etc.  Non-urgent messages can be sent to your provider as well.   To learn more about what you can do with MyChart, go to [email or web address].    Your next appointment:   6 month(s)  The format for your next appointment:   In Person  Provider:   Jenne Campus, MD    Other Instructions Echocardiogram An echocardiogram is a test that uses sound waves (ultrasound) to produce images of the heart. Images from an echocardiogram can provide important information about: Heart size and shape. The size and thickness and movement of your heart's walls. Heart muscle function and strength. Heart valve function or if you have stenosis. Stenosis is when the heart valves are too narrow. If blood is flowing backward through the heart valves (regurgitation). A tumor or infectious growth around the heart valves. Areas of heart muscle that are not working well because of poor blood flow or injury from a heart attack. Aneurysm detection. An aneurysm is a weak or damaged part of an artery wall. The wall bulges out from the normal force of blood pumping through the body. Tell a health care provider about: Any allergies you have. All medicines you are taking, including vitamins, herbs, eye drops, creams, and over-the-counter medicines. Any blood disorders you have. Any surgeries you have had. Any medical conditions you have. Whether you are pregnant or may be pregnant. What are the risks? Generally, this is a safe test. However, problems  may occur, including an allergic reaction to dye (contrast) that may be used during the test. What happens before the test? No specific preparation is needed. You may eat and drink normally. What happens during the test?  You will take off your clothes from the waist up and put on a hospital gown. Electrodes or electrocardiogram  (ECG)patches may be placed on your chest. The electrodes or patches are then connected to a device that monitors your heart rate and rhythm. You will lie down on a table for an ultrasound exam. A gel will be applied to your chest to help sound waves pass through your skin. A handheld device, called a transducer, will be pressed against your chest and moved over your heart. The transducer produces sound waves that travel to your heart and bounce back (or "echo" back) to the transducer. These sound waves will be captured in real-time and changed into images of your heart that can be viewed on a video monitor. The images will be recorded on a computer and reviewed by your health care provider. You may be asked to change positions or hold your breath for a short time. This makes it easier to get different views or better views of your heart. In some cases, you may receive contrast through an IV in one of your veins. This can improve the quality of the pictures from your heart. The procedure may vary among health care providers and hospitals. What can I expect after the test? You may return to your normal, everyday life, including diet, activities, and medicines, unless your health care provider tells you not to do that. Follow these instructions at home: It is up to you to get the results of your test. Ask your health care provider, or the department that is doing the test, when your results will be ready. Keep all follow-up visits. This is important. Summary An echocardiogram is a test that uses sound waves (ultrasound) to produce images of the heart. Images from an echocardiogram can provide important information about the size and shape of your heart, heart muscle function, heart valve function, and other possible heart problems. You do not need to do anything to prepare before this test. You may eat and drink normally. After the echocardiogram is completed, you may return to your normal, everyday  life, unless your health care provider tells you not to do that. This information is not intended to replace advice given to you by your health care provider. Make sure you discuss any questions you have with your health care provider. Document Revised: 08/31/2020 Document Reviewed: 08/11/2019 Elsevier Patient Education  2022 [employer].

## 2021-01-16 ENCOUNTER — Telehealth: Payer: Self-pay | Admitting: Gastroenterology

## 2021-01-16 NOTE — Telephone Encounter (Signed)
Patient is returning your call.  

## 2021-01-16 NOTE — Telephone Encounter (Signed)
Notified [name] about Husband recent lab results. [name] verbalized understanding with all questions answered.

## 2021-01-24 ENCOUNTER — Other Ambulatory Visit: Payer: Self-pay

## 2021-01-24 ENCOUNTER — Ambulatory Visit (INDEPENDENT_AMBULATORY_CARE_PROVIDER_SITE_OTHER): Payer: Medicare Other

## 2021-01-24 DIAGNOSIS — R001 Bradycardia, unspecified: Secondary | ICD-10-CM

## 2021-01-24 DIAGNOSIS — Z8719 Personal history of other diseases of the digestive system: Secondary | ICD-10-CM

## 2021-01-24 DIAGNOSIS — I471 Supraventricular tachycardia: Secondary | ICD-10-CM

## 2021-01-24 LAB — ECHOCARDIOGRAM COMPLETE
Area-P 1/2: 3.03 cm2
S' Lateral: 2.7 cm

## 2021-01-31 ENCOUNTER — Telehealth: Payer: Self-pay

## 2021-02-03 ENCOUNTER — Other Ambulatory Visit: Payer: Self-pay | Admitting: Pulmonary Disease

## 2021-02-03 ENCOUNTER — Ambulatory Visit (INDEPENDENT_AMBULATORY_CARE_PROVIDER_SITE_OTHER): Payer: Medicare Other | Admitting: Pulmonary Disease

## 2021-02-03 ENCOUNTER — Other Ambulatory Visit: Payer: Self-pay

## 2021-02-03 ENCOUNTER — Telehealth: Payer: Self-pay | Admitting: Pulmonary Disease

## 2021-02-03 ENCOUNTER — Encounter: Payer: Self-pay | Admitting: Pulmonary Disease

## 2021-02-03 VITALS — BP 120/64 | HR 74 | Temp 97.4°F | Ht 63.0 in | Wt 139.0 lb

## 2021-02-03 DIAGNOSIS — K219 Gastro-esophageal reflux disease without esophagitis: Secondary | ICD-10-CM | POA: Diagnosis not present

## 2021-02-03 DIAGNOSIS — R053 Chronic cough: Secondary | ICD-10-CM

## 2021-02-03 DIAGNOSIS — R059 Cough, unspecified: Secondary | ICD-10-CM

## 2021-02-03 LAB — PULMONARY FUNCTION TEST
DL/VA % pred: 119 %
DL/VA: 4.95 ml/min/mmHg/L
DLCO cor % pred: 110 %
DLCO cor: 20.46 ml/min/mmHg
DLCO unc % pred: 110 %
DLCO unc: 20.46 ml/min/mmHg
FEF 25-75 Post: 3.26 L/sec
FEF 25-75 Pre: 0.9 L/sec
FEF2575-%Change-Post: 260 %
FEF2575-%Pred-Post: 197 %
FEF2575-%Pred-Pre: 54 %
FEV1-%Change-Post: 45 %
FEV1-%Pred-Post: 93 %
FEV1-%Pred-Pre: 63 %
FEV1-Post: 1.91 L
FEV1-Pre: 1.31 L
FEV1FVC-%Change-Post: 6 %
FEV1FVC-%Pred-Pre: 96 %
FEV6-%Change-Post: 36 %
FEV6-%Pred-Post: 94 %
FEV6-%Pred-Pre: 69 %
FEV6-Post: 2.46 L
FEV6-Pre: 1.8 L
FEV6FVC-%Pred-Post: 105 %
FEV6FVC-%Pred-Pre: 105 %
FVC-%Change-Post: 36 %
FVC-%Pred-Post: 90 %
FVC-%Pred-Pre: 66 %
FVC-Post: 2.46 L
FVC-Pre: 1.8 L
Post FEV1/FVC ratio: 77 %
Post FEV6/FVC ratio: 100 %
Pre FEV1/FVC ratio: 72 %
Pre FEV6/FVC Ratio: 100 %
RV % pred: 104 %
RV: 2.32 L
TLC % pred: 96 %
TLC: 4.73 L

## 2021-02-03 MED ORDER — BUDESONIDE-FORMOTEROL FUMARATE 160-4.5 MCG/ACT IN AERO: 2.0000 | INHALATION_SPRAY | Freq: Two times a day (BID) | RESPIRATORY_TRACT | 5 refills | Status: DC

## 2021-02-03 NOTE — Progress Notes (Signed)
Full PFT performed today. °

## 2021-02-03 NOTE — Telephone Encounter (Signed)
Called patient and she states that her insurance will not cover medication   Pt states that insurance does not cover both symbicort and the generic. Both were too expensive. Pharmacy is [facility] on [address]   Could we look into this for this patient please.

## 2021-02-03 NOTE — Patient Instructions (Signed)
Full PFT performed today. °

## 2021-02-03 NOTE — Progress Notes (Signed)
[name]    [id number]    Jun 09, 1945  Primary Care Physician:Grisso, Clarita Crane., MD  Referring Physician: Raina Mina., MD [place],  [place] [id number]  Chief complaint: Follow-up for chronic cough  HPI: 76 year old with chronic cough likely secondary to LPR, GERD, hiatal hernia, postnasal drip.  Previously followed by Dr. Lenna Gilford.  Was told she had asthma in the past and was tried on multiple inhalers which did not help  Complains of chronic cough, nonproductive in nature.  No associated dyspnea, sputum production, fevers, chills She has seen Dr. Neldon Mc who has put her on double antiacid therapy with PPI and H2 blocker, chlorpheniramine and Flonase for postnasal drip.  She does note some improvement in her cough.  She developed COVID-19 in July 2022 treated with Paxlovid with as an outpatient Recent high-resolution CT for follow-up of lung nodules showed stable lung nodules with no clear evidence of interstitial lung disease Per notes she has had extensive ENT evaluation without any abnormality noted  Pets: Dogs, outside cat and horse Occupation: Microbiologist.  Works part-time Exposures: No mold, hot tub, Customer service manager.  She does have down comforter Smoking history: Never smoker Travel history: Originally from [place].  No significant travel history Relevant family history: Brother and mother had asthma.   Interim history: She is here for follow-up for chronic cough Continues to have significant issues with ongoing paroxysmal cough which is nonproductive in nature Continues on double antiacid therapy.   Outpatient Encounter Medications as of 02/03/2021  Medication Sig   acetaminophen (TYLENOL) 500 MG tablet Take 500 mg by mouth every 6 (six) hours as needed for mild pain.    aspirin EC 81 MG tablet Take 81 mg by mouth daily as needed for mild pain (Chest Pain). Swallow whole.   Cholecalciferol (VITAMIN D3) 50 MCG (2000 UT) TABS Take 1 tablet by mouth  daily.   dicyclomine (BENTYL) 10 MG capsule Take 10 mg by mouth every 6 (six) hours as needed (GI issues).   diltiazem (CARDIZEM CD) 120 MG 24 hr capsule TAKE 1 CAPSULE BY MOUTH EVERY DAY   famotidine (PEPCID) 40 MG tablet Take 1 tablet (40 mg total) by mouth every evening.   fluticasone (FLONASE) 50 MCG/ACT nasal spray Place 2 sprays into both nostrils daily.   levothyroxine (SYNTHROID, LEVOTHROID) 88 MCG tablet Take 88 mcg by mouth daily before breakfast.   LORazepam (ATIVAN) 0.5 MG tablet Take 0.5 mg by mouth at bedtime.   Melatonin 5 MG TABS Take 6 mg by mouth daily.   methocarbamol (ROBAXIN) 500 MG tablet Take 500 mg by mouth daily as needed for muscle spasms.   pantoprazole (PROTONIX) 40 MG tablet Take 1 tablet (40 mg total) by mouth 2 (two) times daily.   promethazine (PHENERGAN) 12.5 MG tablet Take 12.5 mg by mouth every 6 (six) hours as needed for nausea or vomiting.   Turmeric (QC TUMERIC COMPLEX PO) Take 1,000 mcg by mouth daily.   vitamin B-12 (CYANOCOBALAMIN) 250 MCG tablet Take 250 mcg by mouth daily.   [DISCONTINUED] diltiazem (TIAZAC) 120 MG 24 hr capsule Take by mouth.   [DISCONTINUED] escitalopram (LEXAPRO) 10 MG tablet Take 10 mg by mouth daily.   No facility-administered encounter medications on file as of 02/03/2021.    Physical Exam: Blood pressure 120/64, pulse 74, temperature (!) 97.4 F (36.3 C), temperature source Oral, height 5\' 3"  (1.6 m), weight 139 lb (63 kg), SpO2 100 %. Gen:  No acute distress HEENT:  EOMI, sclera anicteric Neck:     No masses; no thyromegaly Lungs:    Clear to auscultation bilaterally; normal respiratory effort CV:         Regular rate and rhythm; no murmurs Abd:      + bowel sounds; soft, non-tender; no palpable masses, no distension Ext:    No edema; adequate peripheral perfusion Skin:      Warm and dry; no rash Neuro: alert and oriented x 3 Psych: normal mood and affect   Data Reviewed: Imaging: High-resolution CT 08/19/20-no  lung nodules, no significant interstitial lung disease. Subtle findings at lung base suggestive of atelectasis versus ground glass opacities/ I have reviewed the images personally.  PFTs: 02/03/2021 FVC 2.46 [90%], FEV1 1.91 [93%], F/F 77, TLC 4.73 [96%], DLCO 20.46 [110%] No obstruction but there is significant bronchodilator response but not trapping suggestive of airway disease   Labs: RAST panel 03/12/2017- negative IgE 09/09/2020- 75 CBC 09/09/2020-WBC 6.3, eos 1.8%, absolute eosinophil count 113  Hypersensitivity pneumonitis 09/09/2020-negative  Assessment:  Follow-up for cough Moderate persistent asthma Likely has to ongoing LPR, GERD in the setting of hiatal hernia, postnasal drip She continues onPPI, H2 blocker and first-generation antihistamine, Flonase  PFTs reviewed with significant bronchodilator response and not trapping suggestive of small airways disease and asthma phenotype though she does not have peripheral eosinophils or IgE elevation.  Asthma is also supported by the fact that steroids make her cough better  Will start on Symbicort 160  GERD Continues to have GERD symptoms in spite of double therapy with PPI and anti-H2 Refer to [facility] gastroenterology Dr. Lyndel Safe as she has consulted with him in the past  Concern for ILD She has exposure to down pillows We reviewed the CT scan on ILD conference on 10/07/2020.  Consensus was that there is no significant interstitial lung disease with very subtle findings at the base.  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   We will continue to monitor and start inhaled steroids  Plan/Recommendations: Start Symbicort Referral to GI  Marshell Garfinkel MD [facility] 02/03/2021, 1:43 PM  CC: Raina Mina., MD

## 2021-02-03 NOTE — Patient Instructions (Signed)
I have reviewed your lung function test which shows changes which are consistent with asthma We will start you on an inhaler called Symbicort 160., use puffs twice daily Continue the antiacid medication I will refer you to [facility] gastroenterology, Dr. Lyndel Safe for further evaluation of acid reflux  Follow-up in 3 months.

## 2021-02-10 ENCOUNTER — Other Ambulatory Visit: Payer: Self-pay

## 2021-02-10 MED ORDER — METOPROLOL TARTRATE 25 MG PO TABS: 12.5000 mg | ORAL_TABLET | Freq: Two times a day (BID) | ORAL | 3 refills | Status: DC

## 2021-02-14 NOTE — Telephone Encounter (Signed)
Called patient and left voicemail for her to call office back °

## 2021-03-02 ENCOUNTER — Other Ambulatory Visit: Payer: Self-pay | Admitting: Allergy and Immunology

## 2021-04-11 ENCOUNTER — Telehealth: Payer: Self-pay | Admitting: Pulmonary Disease

## 2021-04-11 ENCOUNTER — Encounter: Payer: Self-pay | Admitting: *Deleted

## 2021-04-11 NOTE — Telephone Encounter (Signed)
Attempted to call pt but unable to reach. Left message for her to return call. Due to multiple attempts trying to reach pt and unable to do so, letter will be sent to pt and encounter will be closed per protocol. ?

## 2021-04-11 NOTE — Telephone Encounter (Signed)
Called patient and she states that her BREO inhaler is still costing her $75. I offered for her to fill out patient assistance for the [facility] and send it back to our office once filled out. Patient agreed and I printed out paperwork and sent in in the mail. Nothing further needed ?

## 2021-04-18 ENCOUNTER — Telehealth: Payer: Self-pay | Admitting: Pulmonary Disease

## 2021-04-18 NOTE — Telephone Encounter (Signed)
Spoke with the pt  ?She states that the pt assistance forms for Breo still have not arrived  ?I verified her address and mailed to her again per her request ?Nothing further needed ?

## 2021-05-03 ENCOUNTER — Encounter: Payer: Self-pay | Admitting: Pulmonary Disease

## 2021-05-03 ENCOUNTER — Ambulatory Visit (INDEPENDENT_AMBULATORY_CARE_PROVIDER_SITE_OTHER): Payer: Medicare Other | Admitting: Pulmonary Disease

## 2021-05-03 VITALS — BP 118/64 | HR 67 | Temp 97.8°F | Ht 62.5 in | Wt 140.2 lb

## 2021-05-03 DIAGNOSIS — R053 Chronic cough: Secondary | ICD-10-CM | POA: Diagnosis not present

## 2021-05-03 NOTE — Patient Instructions (Signed)
We will schedule a high-resolution CT at [facility] in 3 months for reevaluation of your lungs.  Please make sure that they send the results to [place] ?Start using the Flonase and allergy pill to help with the cough ?Continue the antiacid medication follow-up with Dr. Lyndel Safe ?Follow-up in 6 months. ?

## 2021-05-03 NOTE — Progress Notes (Signed)
? ?      ?[name]    [id number]    23-May-1945 ? ?Primary Care Physician:Sherman, Theresa Crane., MD ? ?Referring Physician: Raina Mina., MD ?[address] ?[place],  [place] [address] ? ?Chief complaint: Follow-up for chronic cough ? 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  Previously followed by Dr. Lenna Sherman.  ?Was told she had asthma in the past and was tried on multiple inhalers which did not help ? ?Complains of chronic cough, nonproductive in nature.  No associated dyspnea, sputum production, fevers, chills ?She has seen Dr. Neldon Sherman who has put her on double antiacid therapy with PPI and H2 blocker, chlorpheniramine and Flonase for postnasal drip.  She does note some improvement in her cough. ? ?She developed COVID-19 in July 2022 treated with Paxlovid with as an outpatient ?Recent high-resolution CT for follow-up of lung nodules showed stable lung nodules with no clear evidence of interstitial lung disease ?Per notes she has had extensive ENT evaluation without any abnormality noted ? ?Pets: Dogs, outside cat and horse ?Occupation: Microbiologist.  Works part-time ?Exposures: No mold, hot tub, Jacuzzi.  She does have down comforter but uses it only intermittently ?Smoking history: Never smoker ?Travel history: Originally from [place].  No significant travel history ?Relevant family history: Brother and mother had asthma.  ? ?Interim history: ?She is here for follow-up for chronic cough ?Continues to have significant issues with ongoing paroxysmal cough which is nonproductive in nature ?Continues on double antiacid therapy ? ?Outpatient Encounter Medications as of 05/03/2021  ?Medication Sig  ? acetaminophen (TYLENOL) 500 MG tablet Take 500 mg by mouth every 6 (six) hours as needed for mild pain.   ? aspirin EC 81 MG tablet Take 81 mg by mouth daily as needed for mild pain (Chest Pain). Swallow whole.  ? Cholecalciferol (VITAMIN D3) 50 MCG (2000 UT)  TABS Take 1 tablet by mouth daily.  ? dicyclomine (BENTYL) 10 MG capsule Take 10 mg by mouth every 6 (six) hours as needed (GI issues).  ? famotidine (PEPCID) 40 MG tablet Take 1 tablet (40 mg total) by mouth every evening.  ? fluticasone furoate-vilanterol (BREO ELLIPTA) 100-25 MCG/ACT AEPB Inhale 1 puff into the lungs daily.  ? levothyroxine (SYNTHROID, LEVOTHROID) 88 MCG tablet Take 88 mcg by mouth daily before breakfast.  ? LORazepam (ATIVAN) 0.5 MG tablet Take 0.5 mg by mouth at bedtime.  ? Melatonin 5 MG TABS Take 6 mg by mouth daily.  ? methocarbamol (ROBAXIN) 500 MG tablet Take 500 mg by mouth daily as needed for muscle spasms.  ? metoprolol tartrate (LOPRESSOR) 25 MG tablet Take 0.5 tablets (12.5 mg total) by mouth 2 (two) times daily.  ? pantoprazole (PROTONIX) 40 MG tablet TAKE 1 TABLET BY MOUTH TWICE A DAY  ? promethazine (PHENERGAN) 12.5 MG tablet Take 12.5 mg by mouth every 6 (six) hours as needed for nausea or vomiting.  ? Turmeric (QC TUMERIC COMPLEX PO) Take 1,000 mcg by mouth daily.  ? vitamin B-12 (CYANOCOBALAMIN) 250 MCG tablet Take 250 mcg by mouth daily.  ? fluticasone (FLONASE) 50 MCG/ACT nasal spray Place 2 sprays into both nostrils daily. (Patient not taking: Reported on 05/03/2021)  ? [DISCONTINUED] diltiazem (CARDIZEM CD) 120 MG 24 hr capsule TAKE 1 CAPSULE BY MOUTH EVERY DAY  ? ?No facility-administered encounter medications on file as of 05/03/2021.  ? ? ?Physical Exam: ?Blood pressure 118/64, pulse 67, temperature 97.8 ?F (36.6 ?C), temperature source  Oral, height 5' 2.5" (1.588 m), weight 140 lb 3.2 oz (63.6 kg), SpO2 98 %. ?Gen:      No acute distress ?HEENT:  EOMI, sclera anicteric ?Neck:     No masses; no thyromegaly ?Lungs:    Clear to auscultation bilaterally; normal respiratory effort ?CV:         Regular rate and rhythm; no murmurs ?Abd:      + bowel sounds; soft, non-tender; no palpable masses, no distension ?Ext:    No edema; adequate peripheral perfusion ?Skin:      Warm and dry;  no rash ?Neuro: alert and oriented x 3 ?Psych: normal mood and affect  ? ?Data Reviewed: ?Imaging: ?High-resolution CT 08/19/20-no lung nodules, no significant interstitial lung disease. Subtle findings at lung base suggestive of atelectasis versus ground glass opacities/ ?I have reviewed the images personally. ? ?PFTs: ?02/03/2021 ?FVC 2.46 [90%], FEV1 1.91 [93%], F/F 77, TLC 4.73 [96%], DLCO 20.46 [110%] ?No obstruction but there is significant bronchodilator response and air trapping suggestive of airway disease. ? ?Labs: ?RAST panel 03/12/2017- negative ?IgE 09/09/2020- 75 ?CBC 09/09/2020-WBC 6.3, eos 1.8%, absolute eosinophil count 113 ? ?Hypersensitivity pneumonitis 09/09/2020-negative ? ?Assessment:  ?Follow-up for cough ?Moderate persistent asthma ?Likely has to ongoing LPR, GERD in the setting of hiatal hernia, postnasal drip ?She continues onPPI, H2 blocker and first-generation antihistamine, Flonase ? ?PFTs reviewed with significant bronchodilator response and not trapping suggestive of small airways disease and asthma phenotype though she does not have peripheral eosinophils or IgE elevation.  Asthma is also supported by the fact that steroids make her cough better ? ?Continue Breo ? ?GERD ?Continues to have GERD symptoms in spite of double therapy with PPI and anti-H2 ?Refer to [facility] gastroenterology Dr. Lyndel Sherman as she has consulted with him in the past ? ?Concern for ILD ?She has occasional exposure to down pillows ?We reviewed the CT scan on ILD conference on 10/07/2020.  Consensus was that there is no significant interstitial lung disease with very subtle findings at the base.  Possibility was raised of Esbon syndrome but not conclusive.   ? ?Continue to monitor.  Order follow-up high-res CT in 3 months. ? ?OSA ?She has been nontolerant with CPAP and been off therapy since 2015.  She has lost a lot of weight in the interim and does not want to retry CPAP ? ?Plan/Recommendations: ?Continue Breo ?High-res CT in  3 months ?Follow-up with GI ? ?Theresa Garfinkel MD ?[facility] ?05/03/2021, 2:34 PM ? ?CC: Theresa Mina., MD ? ?  ?

## 2021-05-04 ENCOUNTER — Telehealth: Payer: Self-pay | Admitting: Pulmonary Disease

## 2021-05-04 NOTE — Telephone Encounter (Signed)
Patient brought [place] patient assistance for Breo to office visit.  Completed [place] paperwork, with pharmacy record faxed to [facility].  Patient is aware she can follow up with [place] for patient assistance updates. ?

## 2021-05-05 ENCOUNTER — Other Ambulatory Visit: Payer: Self-pay | Admitting: *Deleted

## 2021-05-05 MED ORDER — FLUTICASONE FUROATE-VILANTEROL 100-25 MCG/ACT IN AEPB: 1.0000 | INHALATION_SPRAY | Freq: Every day | RESPIRATORY_TRACT | 5 refills | Status: DC

## 2021-05-05 NOTE — Telephone Encounter (Signed)
I also received fax earlier this morning from [place].  Requested documentation faxed to [place] patient assistance. Nothing further at this time. ?

## 2021-05-05 NOTE — Telephone Encounter (Signed)
Received fax stating that [place] application is missing information necessary for processing. Missing info includes: Medicare Beneficiary ID number, copy of Medicare Part D prescription drug card, Valid prescription. ? ?Letter will be placed in [place]'s box for f/u. ?

## 2021-05-10 ENCOUNTER — Encounter: Payer: Self-pay | Admitting: Gastroenterology

## 2021-05-15 ENCOUNTER — Other Ambulatory Visit: Payer: Self-pay | Admitting: Pulmonary Disease

## 2021-05-31 DIAGNOSIS — N2889 Other specified disorders of kidney and ureter: Secondary | ICD-10-CM

## 2021-05-31 HISTORY — DX: Other specified disorders of kidney and ureter: N28.89

## 2021-06-14 DIAGNOSIS — N2889 Other specified disorders of kidney and ureter: Secondary | ICD-10-CM | POA: Insufficient documentation

## 2021-06-16 ENCOUNTER — Encounter: Payer: Self-pay | Admitting: Gastroenterology

## 2021-06-16 ENCOUNTER — Ambulatory Visit (INDEPENDENT_AMBULATORY_CARE_PROVIDER_SITE_OTHER): Payer: Medicare Other | Admitting: Gastroenterology

## 2021-06-16 VITALS — BP 122/78 | HR 67 | Ht 62.5 in | Wt 141.1 lb

## 2021-06-16 DIAGNOSIS — K219 Gastro-esophageal reflux disease without esophagitis: Secondary | ICD-10-CM | POA: Diagnosis not present

## 2021-06-16 DIAGNOSIS — Z8719 Personal history of other diseases of the digestive system: Secondary | ICD-10-CM | POA: Diagnosis not present

## 2021-06-16 DIAGNOSIS — R059 Cough, unspecified: Secondary | ICD-10-CM

## 2021-06-16 DIAGNOSIS — R1310 Dysphagia, unspecified: Secondary | ICD-10-CM | POA: Diagnosis not present

## 2021-06-16 MED ORDER — PANTOPRAZOLE SODIUM 40 MG PO TBEC: 40.0000 mg | DELAYED_RELEASE_TABLET | Freq: Two times a day (BID) | ORAL | 3 refills | Status: DC

## 2021-06-16 MED ORDER — FAMOTIDINE 20 MG PO TABS: 20.0000 mg | ORAL_TABLET | Freq: Every day | ORAL | 3 refills | Status: AC

## 2021-06-16 NOTE — Progress Notes (Signed)
Chief Complaint: Incontinence  Referring Provider:  Marshell Garfinkel, MD      ASSESSMENT AND PLAN;   #1. GERD with cough with occ dysphagia. H/O [facility] s/p Nissen's 2014, re-do 2018.   #3. H/O colonic polyps 05/2015/ FH of colon cancer (mother at age 75).  Neg colon Dr Noberto Retort 2021 at [facility] per pt. No need to rpt.  Plan: -Protonix '40mg'$  po BID #180,  -Pepcid '20mg'$  po QHS -Ba swallow with Ba tab. -EGD for further eval  HPI:    [name] is a 76 y.o. female  With ?ILD, asthma, postnasal drip, GERD, chronic cough, anxiety, post-COVID syndrome  With chronic cough x 6 yrs, nonproductive, occasionally after eating.  Had ENT evaluation, evaluation by Dr. Neldon Mc and most recently pulmonary evaluation-thought to be due to GERD.  CT chest July 2022 showed pulmonary nodules-being evaluated for ILD.  She does have occasional regurgitation and occasional dysphagia mostly to solids.  Denies having any significant heartburn.  No nausea or vomiting.  She does have history of hoarseness.  Per patient's husband, she could even cough at night while asleep.  He has raised the head of the bed which has helped.  S/P Nissen's fundoplication in 7096, followed by slipped Nissen's requiring partial fundoplication in 2836.  Barium swallow January 2020 showed small hiatal hernia with silent laryngeal penetration.  No history of CVA.  No further diarrhea ever since she has been has been off dairy.  No associated dyspnea, sputum production, fevers, chills.  She has seen Dr. Neldon Mc who has put her on double antiacid therapy with PPI and H2 blocker, chlorpheniramine and Flonase for postnasal drip.  She does note some improvement in her cough.   She developed COVID-19 in July 2022 treated with Paxlovid with as an outpatient   Past GI work-up: Colon Dr Noberto Retort - neg per pt. 2-3 yrs ago. No need to rpt Colon- polyps 05/2015  Ba UGI 01/2018 1. The patient does have silent laryngeal penetration with  thin liquids but there is no aspiration. 2. Prominent spontaneous gastroesophageal reflux. Small hiatal hernia. The distal esophageal wrap demonstrated on the prior upper GI is not appreciable on this exam.  Past Medical History:  Diagnosis Date   Abnormal chest x-ray with multiple lung nodules 08/22/2017   Abnormal weight loss    Age-related osteoporosis without current pathological fracture 03/15/2015   Last Assessment & Plan:  Formatting of this note might be different from the original. Relevant Hx: Course: Daily Update: Today's Plan:discussed in depth with her and she is going to try the boniva when she gets back from her [place] trip, and she was advised to increase her vitamin D 3 to 3000 IU daily after review of her lab and we reviewed her DEXA last year and she will be due for this in 2018  [place]   Alopecia    Anxiety    antianxiety med. usually at night but can take during the day for anxiety   Arthralgia of hip 10/26/2019   Arthritis    both knees & hands  also the back & neck   Asthma    Atrial fibrillation ([place])    pt. reports that [facility] that she had a "slight afib.", no further test needed (01/07/14 cardiology note does not mention any afib, just poor anterior r wave progression)   Cervical radiculopathy 01/19/2017   Chronic cough    Chronic rhinitis 12/04/2012   Followed in Pulmonary clinic/ [facility]/ [facility]   - sinus  ct 10/23/2012 >>> Clear paranasal sinuses. - trial of qid 1st gen H1 02/12/2013 > not effective 2/99/24        Complication of anesthesia    Coronary artery disease involving native coronary artery of native heart without angina pectoris 06/08/2016   Formatting of this note might be different from the original. No longer sees cardiolgist.  ST okay.  Imaging with calcium   Cough variant asthma 06/18/2013   - Methacholine challenge test 06/03/13 > equivocal but clinically caused tightness and made her cough - hfa 75% p coaching 06/17/13 > Trial of dulera 100 2bid  started 06/18/2013     Degenerative cervical disc 06/08/2016   Depression    Diaphragmatic hernia    Dizziness 12/11/2019   Elbow tendonitis    Endometriosis    Gastric polyp    Gastroenteritis    GERD (gastroesophageal reflux disease)    Hiatal hernia 11/24/2013   High risk medication use 03/15/2015   History of colonic polyps 02/09/2019   History of hiatal hernia    pt. has been told that the hiatal hernia has reappeared   History of melanoma 06/17/2017   Hypercholesteremia    Hyperlipidemia    Hypertension    pt. reports that she use to take antihtn med, but reports since weight loss she has been able to come off.     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 Following with [facility].  [place].Neg PET scan   Malaise and fatigue 03/15/2015   Last Assessment & Plan:  Formatting of this note might be different from the original. Relevant Hx: Course: Daily Update: Today's Plan:she has some days better than others for her and she admits that with her allergies and asthma at times she is more tired as well as with the OA she has that creates some difficulty  Electronically signed by: Mayer Camel, NP 04/04/15 1058   Melanoma ([place]) 2011   heel   Mixed hyperlipidemia 03/15/2015   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   OSA (obstructive sleep apnea)    does not tolerate CPAP , pt. reports that its severe, but doesn't use it anymore, since weight loss.   Study done in [place], not sure where.    Osteoarthritis    Osteoporosis    PONV (postoperative nausea and vomiting)    Prediabetes 11/18/2019   Primary insomnia 03/15/2015   Primary osteoarthritis involving multiple joints 03/15/2015   Last Assessment & Plan:  Formatting of this note might be different from the original. 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   Restless leg syndrome 11/18/2017   S/P knee replacement 01/29/2014   Seasonal allergic rhinitis due to pollen 03/17/2019   Sinus bradycardia 12/07/2019   Sinus bradycardia by electrocardiogram 12/07/2019   Upper airway cough syndrome 08/04/2012   Followed in Pulmonary clinic/ [facility]/ [facility]  -[place] eval around 2012 pos dust/ roach  Neg resp to singulair  - PFT's 02/2010 nl in effort dep portion of f/v loop, lung vol and dlcos  also nl  - CT chest 05/27/12 wnl x small HH - 08/04/12   [facility] AT >  MM - informed 09/10/2012 had not received demerol rx on 09/02/12  - sinus ct 10/23/2012 >>> Clear paranasal sinuses. - responded to neuronti   Vitamin B 12 deficiency    Vitamin D deficiency     Past Surgical History:  Procedure Laterality Date   ABDOMINAL HYSTERECTOMY     APPENDECTOMY     CHOLECYSTECTOMY     COLONOSCOPY  05/09/2015   Colonic Polyps, moderate sigmoid diverticulosis. Bx: Tubular Adenomas. Negative   DILATION AND CURETTAGE, DIAGNOSTIC / THERAPEUTIC     ESOPHAGEAL DILATION  08/2016   EYE SURGERY     cataracts removed / w [place]     HIATAL HERNIA REPAIR  2/14   HIATAL HERNIA REPAIR  2014   revision of 2018 in [place] ARTHROSCOPY     both knees for torn cartilage    NERVE REPAIR Left 07/11/2017   Procedure: Excision of saphenous nerve left knee. Right knee intra-articular injection.;  Surgeon: Paralee Cancel, MD;  Location: [facility];  Service: Orthopedics;  Laterality: Left;  60 mins   OOPHORECTOMY     TONSILLECTOMY     TOTAL KNEE ARTHROPLASTY Left 01/29/2014   Procedure: LEFT TOTAL KNEE ARTHROPLASTY MCL  REPAIR;  Surgeon: Augustin Schooling, MD;  Location: [facility];  Service: Orthopedics;  Laterality: Left;   VESICOVAGINAL FISTULA CLOSURE W/ TAH      Family History  Problem Relation Age of Onset   Emphysema Father        smoked   Heart disease Father    Arthritis Father    Asthma Mother    Colon cancer Mother    Arthritis Mother    Asthma Brother    Arthritis Brother    Breast cancer Sister    Breast cancer Maternal Grandmother    Heart disease Maternal Grandmother    Heart disease Paternal Grandmother    Heart disease Paternal Aunt     Social History   Tobacco Use   Smoking status: Never   Smokeless tobacco: Never  Vaping Use   Vaping Use: Never used  Substance Use Topics   Alcohol use: No    Comment: rare for holiday consumption only   Drug use: No    Current Outpatient Medications  Medication Sig Dispense Refill   acetaminophen (TYLENOL) 500 MG tablet Take 500 mg by mouth every 6 (six) hours as needed for mild pain.      aspirin EC 81 MG tablet Take 81 mg by mouth daily as needed for mild pain (Chest Pain). Swallow whole.     Cholecalciferol (VITAMIN D3) 50 MCG (2000 UT) TABS Take 1 tablet by mouth daily.     dicyclomine (BENTYL) 10 MG capsule Take 10 mg by mouth every 6 (six) hours as needed (GI issues).     famotidine (PEPCID) 40 MG tablet Take 1 tablet (40 mg total) by mouth every evening. 30 tablet 5   fluticasone (FLONASE) 50 MCG/ACT nasal spray Place 2 sprays into both nostrils daily. (Patient not taking: Reported on 05/03/2021)     fluticasone furoate-vilanterol (BREO ELLIPTA) 100-25 MCG/ACT AEPB TAKE 1 PUFF BY MOUTH EVERY DAY 60 each 5   levothyroxine (SYNTHROID, LEVOTHROID) 88 MCG tablet Take 88 mcg by mouth daily before breakfast.     LORazepam (ATIVAN) 0.5 MG tablet Take 0.5 mg by  mouth at bedtime.  0   Melatonin 5 MG TABS Take 6 mg by mouth daily.     methocarbamol (ROBAXIN) 500 MG tablet Take 500 mg by mouth daily as needed for muscle spasms.     metoprolol  tartrate (LOPRESSOR) 25 MG tablet Take 0.5 tablets (12.5 mg total) by mouth 2 (two) times daily. 90 tablet 3   pantoprazole (PROTONIX) 40 MG tablet TAKE 1 TABLET BY MOUTH TWICE A DAY 60 tablet 5   promethazine (PHENERGAN) 12.5 MG tablet Take 12.5 mg by mouth every 6 (six) hours as needed for nausea or vomiting.     vitamin B-12 (CYANOCOBALAMIN) 250 MCG tablet Take 250 mcg by mouth daily.     No current facility-administered medications for this visit.    Allergies  Allergen Reactions   Iodinated Contrast Media Rash   Neurontin [Gabapentin] Other (See Comments)    Severe falls,dizziness, forgetful    Ciprofloxacin Diarrhea   Codeine Nausea And Vomiting   Eggs Or Egg-Derived Products     GI upset per patient   Other Nausea And Vomiting    General anesthesia    Oxycodone Nausea And Vomiting   Tramadol     REACTION: GI discomfort with high doses   Cyclobenzaprine Palpitations   Sulfa Antibiotics Rash    Review of Systems:  Constitutional: Denies fever, chills, diaphoresis, appetite change and fatigue.  HEENT: Denies photophobia, eye pain, redness, hearing loss, ear pain, congestion, sore throat, rhinorrhea, sneezing, mouth sores, neck pain, neck stiffness and tinnitus.   Respiratory: Denies SOB, DOE.  Has chronic cough and has seen multiple physicians Cardiovascular: Denies chest pain, palpitations and leg swelling.  Genitourinary: Denies dysuria, urgency, frequency, hematuria, flank pain and difficulty urinating.  Musculoskeletal: Has myalgias, arthralgias and gait problem. Has arthritis and back pain Skin: No rash.  Neurological: Denies dizziness, seizures, syncope, weakness, light-headedness, numbness and headaches.  Hematological: Denies adenopathy. Easy bruising, personal or family bleeding history  Psychiatric/Behavioral: Has anxiety or depression. Has sleeping problems.     Physical Exam:    BP 122/78   Pulse 67   Ht 5' 2.5" (1.588 m)   Wt 141 lb 2 oz (64 kg)   SpO2  99%   BMI 25.40 kg/m  Filed Weights   06/16/21 1520  Weight: 141 lb 2 oz (64 kg)   Constitutional:  Well-developed, in no acute distress. Psychiatric: Normal mood and affect. Behavior is normal. HEENT: Pupils normal.  Conjunctivae are normal. No scleral icterus.  Has postnasal drip. Cardiovascular: Normal rate, regular rhythm. No edema Pulmonary/chest: Effort normal and breath sounds normal. No wheezing, rales or rhonchi. Abdominal: Soft, nondistended. Nontender. Bowel sounds active throughout. There are no masses palpable. No hepatomegaly. Rectal: Good rectal tone, heme-negative stools. Neurological: Alert and oriented to person place and time. Skin: Skin is warm and dry. No rashes noted.  Data Reviewed: I have personally reviewed following labs and imaging studies  CBC:    Latest Ref Rng & Units 09/09/2020    2:41 PM 07/08/2017    2:34 PM 05/08/2016    9:31 AM  CBC  WBC 4.0 - 10.5 K/uL 6.3  5.7  9.0   Hemoglobin 12.0 - 15.0 g/dL 13.4  13.6  14.7   Hematocrit 36.0 - 46.0 % 40.8  41.6  45.3   Platelets 150.0 - 400.0 K/uL 260.0  319  265     CMP:    Latest Ref Rng & Units 07/08/2017    2:34 PM 05/08/2016  9:31 AM 01/30/2014    4:55 AM  CMP  Glucose 70 - 99 mg/dL 88  81  123   BUN 8 - 23 mg/dL '14  14  9   '$ Creatinine 0.44 - 1.00 mg/dL 0.78  0.86  0.80   Sodium 135 - 145 mmol/L 142  142  134   Potassium 3.5 - 5.1 mmol/L 4.5  4.0  4.3   Chloride 98 - 111 mmol/L 110  109  100   CO2 22 - 32 mmol/L '26  25  27   '$ Calcium 8.9 - 10.3 mg/dL 9.2  9.6  8.6        Carmell Austria, MD 06/16/2021, 3:30 PM  Cc: Marshell Garfinkel, MD

## 2021-06-16 NOTE — Patient Instructions (Signed)
If you are age 76 or older, your body mass index should be between 23-30. Your Body mass index is 25.4 kg/m. If this is out of the aforementioned range listed, please consider follow up with your Primary Care Provider.  If you are age 13 or younger, your body mass index should be between 19-25. Your Body mass index is 25.4 kg/m. If this is out of the aformentioned range listed, please consider follow up with your Primary Care Provider.   ________________________________________________________  The [facility] providers would like to encourage you to use [facility] to communicate with providers for non-urgent requests or questions.  Due to long hold times on the telephone, sending your provider a message by [facility] may be a faster and more efficient way to get a response.  Please allow 48 business hours for a response.  Please remember that this is for non-urgent requests.  _______________________________________________________  We have sent the following medications to your pharmacy for you to pick up at your convenience: [place] at night  You have been scheduled for an endoscopy. Please follow written instructions given to you at your visit today. If you use inhalers (even only as needed), please bring them with you on the day of your procedure.   You have been scheduled for a Barium Esophogram at [facility] (1st floor of the hospital) on July 3rd at 930am. Please arrive 30 minutes prior to your appointment for registration. Make certain not to have anything to eat or drink 3 hours prior to your test. If you need to reschedule for any reason, please contact radiology at [phone] to do so. __________________________________________________________________ A barium swallow is an examination that concentrates on views of the esophagus. This tends to be a double contrast exam (barium and two liquids which, when combined, create a gas to distend the wall of the  oesophagus) or single contrast (non-ionic iodine based). The study is usually tailored to your symptoms so a good history is essential. Attention is paid during the study to the form, structure and configuration of the esophagus, looking for functional disorders (such as aspiration, dysphagia, achalasia, motility and reflux) EXAMINATION You may be asked to change into a gown, depending on the type of swallow being performed. A radiologist and radiographer will perform the procedure. The radiologist will advise you of the type of contrast selected for your procedure and direct you during the exam. You will be asked to stand, sit or lie in several different positions and to hold a small amount of fluid in your mouth before being asked to swallow while the imaging is performed .In some instances you may be asked to swallow barium coated marshmallows to assess the motility of a solid food bolus. The exam can be recorded as a digital or video fluoroscopy procedure. POST PROCEDURE It will take 1-2 days for the barium to pass through your system. To facilitate this, it is important, unless otherwise directed, to increase your fluids for the next 24-48hrs and to resume your normal diet.  This test typically takes about 30 minutes to perform. __________________________________________________________________________________  July 3rd

## 2021-06-30 ENCOUNTER — Other Ambulatory Visit: Payer: Self-pay | Admitting: Family Medicine

## 2021-06-30 DIAGNOSIS — N2889 Other specified disorders of kidney and ureter: Secondary | ICD-10-CM

## 2021-07-03 ENCOUNTER — Other Ambulatory Visit (HOSPITAL_COMMUNITY): Payer: Medicare Other

## 2021-07-03 ENCOUNTER — Ambulatory Visit
Admission: RE | Admit: 2021-07-03 | Discharge: 2021-07-03 | Disposition: A | Discharge status: Home / Self Care | Payer: Medicare Other | Source: Ambulatory Visit | Attending: Family Medicine | Admitting: Family Medicine

## 2021-07-03 DIAGNOSIS — N2889 Other specified disorders of kidney and ureter: Secondary | ICD-10-CM

## 2021-07-03 MED ORDER — GADOBENATE DIMEGLUMINE 529 MG/ML IV SOLN
13.0000 mL | Freq: Once | INTRAVENOUS | Status: AC | PRN
  Administered 2021-07-03: 13 mL via INTRAVENOUS

## 2021-07-11 ENCOUNTER — Telehealth: Payer: Self-pay | Admitting: Gastroenterology

## 2021-07-11 NOTE — Telephone Encounter (Signed)
Patient has an upcoming procedure with Dr. Lyndel Safe.  She wants to know if she is supposed to have her Barium Swallow before the procedure.  Please call and advise.  Thank you.

## 2021-07-11 NOTE — Telephone Encounter (Signed)
Please get barium swallow done at least a week prior to EGD RG

## 2021-07-12 ENCOUNTER — Other Ambulatory Visit: Payer: Self-pay

## 2021-07-12 DIAGNOSIS — R059 Cough, unspecified: Secondary | ICD-10-CM

## 2021-07-12 DIAGNOSIS — R1310 Dysphagia, unspecified: Secondary | ICD-10-CM

## 2021-07-12 DIAGNOSIS — Z8719 Personal history of other diseases of the digestive system: Secondary | ICD-10-CM

## 2021-07-12 DIAGNOSIS — K219 Gastro-esophageal reflux disease without esophagitis: Secondary | ICD-10-CM

## 2021-07-12 DIAGNOSIS — R1110 Vomiting, unspecified: Secondary | ICD-10-CM

## 2021-07-12 NOTE — Telephone Encounter (Signed)
Pt made aware of Dr. Lyndel Safe recommendations: Pt was ordered and scheduled a barium swallow for 07/24/2021 at 10:30 at [place] at 10:15: Nothing to eat or drink 3 hours prior: Pt stated that this date will not work for her: Pt was given the phone number to Radiology [phone] to call and reschedule:  Pt was notified that this needs to be done at least a week prior to the EGD. Pt verbalized understanding with all questions answered.

## 2021-07-24 ENCOUNTER — Ambulatory Visit (HOSPITAL_COMMUNITY): Payer: Medicare Other

## 2021-07-27 ENCOUNTER — Ambulatory Visit (HOSPITAL_COMMUNITY)
Admission: RE | Admit: 2021-07-27 | Discharge: 2021-07-27 | Disposition: A | Discharge status: Home / Self Care | Payer: Medicare Other | Source: Ambulatory Visit | Attending: Gastroenterology | Admitting: Gastroenterology

## 2021-07-27 DIAGNOSIS — K219 Gastro-esophageal reflux disease without esophagitis: Secondary | ICD-10-CM | POA: Insufficient documentation

## 2021-07-27 DIAGNOSIS — Z8719 Personal history of other diseases of the digestive system: Secondary | ICD-10-CM | POA: Diagnosis present

## 2021-07-27 DIAGNOSIS — R1310 Dysphagia, unspecified: Secondary | ICD-10-CM | POA: Diagnosis present

## 2021-07-27 DIAGNOSIS — R059 Cough, unspecified: Secondary | ICD-10-CM | POA: Diagnosis present

## 2021-08-03 ENCOUNTER — Encounter: Payer: Self-pay | Admitting: Gastroenterology

## 2021-08-03 ENCOUNTER — Ambulatory Visit (AMBULATORY_SURGERY_CENTER): Payer: Medicare Other | Admitting: Gastroenterology

## 2021-08-03 VITALS — BP 132/67 | HR 64 | Temp 97.8°F | Resp 12 | Ht 62.0 in | Wt 141.0 lb

## 2021-08-03 DIAGNOSIS — K2970 Gastritis, unspecified, without bleeding: Secondary | ICD-10-CM | POA: Diagnosis not present

## 2021-08-03 DIAGNOSIS — K319 Disease of stomach and duodenum, unspecified: Secondary | ICD-10-CM | POA: Diagnosis not present

## 2021-08-03 DIAGNOSIS — R1310 Dysphagia, unspecified: Secondary | ICD-10-CM | POA: Diagnosis not present

## 2021-08-03 DIAGNOSIS — K219 Gastro-esophageal reflux disease without esophagitis: Secondary | ICD-10-CM

## 2021-08-03 MED ORDER — SODIUM CHLORIDE 0.9 % IV SOLN: 500.0000 mL | INTRAVENOUS | Status: DC

## 2021-08-03 NOTE — Progress Notes (Signed)
Called to room to assist during endoscopic procedure.  Patient ID and intended procedure confirmed with present staff. Received instructions for my participation in the procedure from the performing physician.  

## 2021-08-03 NOTE — Patient Instructions (Addendum)
Handouts provided for gastritis and stricture.  Post dilation diet instructions reviewed.  Aspiration precautions.  Continue present medications.  Await pathology results.  YOU HAD AN ENDOSCOPIC PROCEDURE TODAY AT [facility]:   Refer to the procedure report that was given to you for any specific questions about what was found during the examination.  If the procedure report does not answer your questions, please call your gastroenterologist to clarify.  If you requested that your care partner not be given the details of your procedure findings, then the procedure report has been included in a sealed envelope for you to review at your convenience later.  YOU SHOULD EXPECT: Some feelings of bloating in the abdomen. Passage of more gas than usual.  Walking can help get rid of the air that was put into your GI tract during the procedure and reduce the bloating. If you had a lower endoscopy (such as a colonoscopy or flexible sigmoidoscopy) you may notice spotting of blood in your stool or on the toilet paper. If you underwent a bowel prep for your procedure, you may not have a normal bowel movement for a few days.  Please Note:  You might notice some irritation and congestion in your nose or some drainage.  This is from the oxygen used during your procedure.  There is no need for concern and it should clear up in a day or so.  SYMPTOMS TO REPORT IMMEDIATELY:  Following upper endoscopy (EGD)  Vomiting of blood or coffee ground material  New chest pain or pain under the shoulder blades  Painful or persistently difficult swallowing  New shortness of breath  Fever of 100F or higher  Black, tarry-looking stools  For urgent or emergent issues, a gastroenterologist can be reached at any hour by calling [phone]. Do not use MyChart messaging for urgent concerns.    DIET:  We do recommend a small meal at first, but then you may proceed to your regular diet.  Drink plenty of fluids  but you should avoid alcoholic beverages for 24 hours.  ACTIVITY:  You should plan to take it easy for the rest of today and you should NOT DRIVE or use heavy machinery until tomorrow (because of the sedation medicines used during the test).    FOLLOW UP: Our staff will call the number listed on your records the next business day following your procedure.  We will call around 7:15- 8:00 am to check on you and address any questions or concerns that you may have regarding the information given to you following your procedure. If we do not reach you, we will leave a message.  If you develop any symptoms (ie: fever, flu-like symptoms, shortness of breath, cough etc.) before then, please call [phone].  If you test positive for Covid 19 in the 2 weeks post procedure, please call and report this information to [place].    If any biopsies were taken you will be contacted by phone or by letter within the next 1-3 weeks.  Please call us at [phone] if you have not heard about the biopsies in 3 weeks.    SIGNATURES/CONFIDENTIALITY: You and/or your care partner have signed paperwork which will be entered into your electronic medical record.  These signatures attest to the fact that that the information above on your After Visit Summary has been reviewed and is understood.  Full responsibility of the confidentiality of this discharge information lies with you and/or your care-partner.

## 2021-08-03 NOTE — Progress Notes (Signed)
Pt in recovery with monitors in place, VSS. Report given to receiving RN. Bite guard was placed with pt awake to ensure comfort. No dental or soft tissue damage noted. 

## 2021-08-03 NOTE — Progress Notes (Signed)
Vs by DT Pt denies being on blood thinner -has hx A-Fib

## 2021-08-03 NOTE — Progress Notes (Signed)
Notified K Stanley of egg allergy & hx a-fib,denies being on blood thinner med

## 2021-08-03 NOTE — Op Note (Signed)
[facility] Patient Name: [name] Procedure Date: 08/03/2021 9:56 AM MRN: [id number] Endoscopist: Jackquline Denmark , MD Age: 76 Referring MD:  Date of Birth: 08/09/1945 Gender: Female Account #: [id number] Procedure:                Upper GI endoscopy Indications:              GERD with cough with occ dysphagia. H/O [facility] s/p                            Nissen's 2014, re-do 2018. Ba Swallow with                            esophageal dysmotility, hang up of barium tablet in                            the distal esophagus. Medicines:                Monitored Anesthesia Care Procedure:                Pre-Anesthesia Assessment:                           - Prior to the procedure, a History and Physical                            was performed, and patient medications and                            allergies were reviewed. The patient's tolerance of                            previous anesthesia was also reviewed. The risks                            and benefits of the procedure and the sedation                            options and risks were discussed with the patient.                            All questions were answered, and informed consent                            was obtained. Prior Anticoagulants: The patient has                            taken no previous anticoagulant or antiplatelet                            agents. ASA Grade Assessment: III - A patient with                            severe systemic disease. After reviewing the risks  and benefits, the patient was deemed in                            satisfactory condition to undergo the procedure.                           After obtaining informed consent, the endoscope was                            passed under direct vision. Throughout the                            procedure, the patient's blood pressure, pulse, and                            oxygen saturations were monitored  continuously. The                            Endoscope was introduced through the mouth, and                            advanced to the second part of duodenum. The upper                            GI endoscopy was accomplished without difficulty.                            The patient tolerated the procedure well. Scope In: Scope Out: Findings:                 The lower third of the esophagus was moderately                            tortuous with well-defined Z-line at 34 cm.                            Biopsies were obtained from the proximal and distal                            esophagus with cold forceps for histology to r/o                            eosinophilic esophagitis.                           Evidence of a fundoplication was found in the                            cardia. The wrap appeared tight (mildly). The scope                            was withdrawn. Dilation was performed with a  Maloney dilator with mild resistance at 48 Fr and                            50 Fr.                           Localized mild inflammation characterized by                            erythema was found in the gastric antrum. Biopsies                            were taken with a cold forceps for histology.                           The examined duodenum was normal. Biopsies for                            histology were taken with a cold forceps for                            evaluation of celiac disease. Complications:            No immediate complications. Estimated Blood Loss:     Estimated blood loss: none. Impression:               - A fundoplication was found. The wrap appears                            slightly tight with torturous distal esophagus.                            Dilated.                           - Gastritis. Biopsied.                           - Normal examined duodenum. Biopsied. Recommendation:           - Patient has a contact number  available for                            emergencies. The signs and symptoms of potential                            delayed complications were discussed with the                            patient. Return to normal activities tomorrow.                            Written discharge instructions were provided to the                            patient.                           -  Post dilatation diet.                           - Aspiration precautions.                           - Continue present medications.                           - Await pathology results.                           - The findings and recommendations were discussed                            with the patient's family. If continued problems,                            consider esophageal manometry. Jackquline Denmark, MD 08/03/2021 10:27:06 AM This report has been signed electronically.

## 2021-08-03 NOTE — Progress Notes (Signed)
Chief Complaint: Incontinence  Referring Provider:  Raina Mina., MD      ASSESSMENT AND PLAN;   #1. GERD with cough with occ dysphagia. H/O [facility] s/p Nissen's 2014, re-do 2018.   #3. H/O colonic polyps 05/2015/ FH of colon cancer (mother at age 75).  Neg colon Dr Noberto Retort 2021 at [facility] per pt. No need to rpt.  Plan: -Protonix '40mg'$  po BID #180,  -Pepcid '20mg'$  po QHS -Ba swallow with Ba tab. -EGD for further eval   Ba swallow 1. Partial Nissen fundoplication remains intact without recurrence of hiatal hernia. 2.  Moderate esophageal dysmotility. 3.  Laryngeal penetration with thin barium.  No aspiration.  HPI:    [name] is a 76 y.o. female  With ?ILD, asthma, postnasal drip, GERD, chronic cough, anxiety, post-COVID syndrome  With chronic cough x 6 yrs, nonproductive, occasionally after eating.  Had ENT evaluation, evaluation by Dr. Neldon Mc and most recently pulmonary evaluation-thought to be due to GERD.  CT chest July 2022 showed pulmonary nodules-being evaluated for ILD.  She does have occasional regurgitation and occasional dysphagia mostly to solids.  Denies having any significant heartburn.  No nausea or vomiting.  She does have history of hoarseness.  Per patient's husband, she could even cough at night while asleep.  He has raised the head of the bed which has helped.  S/P Nissen's fundoplication in 7062, followed by slipped Nissen's requiring partial fundoplication in 3762.  Barium swallow January 2020 showed small hiatal hernia with silent laryngeal penetration.  No history of CVA.  No further diarrhea ever since she has been has been off dairy.  No associated dyspnea, sputum production, fevers, chills.  She has seen Dr. Neldon Mc who has put her on double antiacid therapy with PPI and H2 blocker, chlorpheniramine and Flonase for postnasal drip.  She does note some improvement in her cough.   She developed COVID-19 in July 2022 treated with Paxlovid with as an  outpatient   Past GI work-up: Colon Dr Noberto Retort - neg per pt. 2-3 yrs ago. No need to rpt Colon- polyps 05/2015  Ba UGI 01/2018 1. The patient does have silent laryngeal penetration with thin liquids but there is no aspiration. 2. Prominent spontaneous gastroesophageal reflux. Small hiatal hernia. The distal esophageal wrap demonstrated on the prior upper GI is not appreciable on this exam.  Past Medical History:  Diagnosis Date   Abnormal chest x-ray with multiple lung nodules 08/22/2017   Abnormal weight loss    Age-related osteoporosis without current pathological fracture 03/15/2015   Last Assessment & Plan:  Formatting of this note might be different from the original. Relevant Hx: Course: Daily Update: Today's Plan:discussed in depth with her and she is going to try the boniva when she gets back from her [place] trip, and she was advised to increase her vitamin D 3 to 3000 IU daily after review of her lab and we reviewed her DEXA last year and she will be due for this in 2018  El   Allergy    Alopecia    Anemia    Anxiety    antianxiety med. usually at night but can take during the day for anxiety   Arthralgia of hip 10/26/2019   Arthritis    both knees & hands  also the back & neck   Asthma    Atrial fibrillation ([place])    pt. reports that [facility] that she had a "slight afib.", no further test needed (01/07/14 cardiology note  does not mention any afib, just poor anterior r wave progression)   Cataract    Cervical radiculopathy 01/19/2017   Chronic cough    Chronic rhinitis 12/04/2012   Followed in Pulmonary clinic/ [facility]/ [place]   - sinus ct 10/23/2012 >>> Clear paranasal sinuses. - trial of qid 1st gen H1 02/12/2013 > not effective 5/57/32        Complication of anesthesia    Coronary artery disease involving native coronary artery of native heart without angina pectoris 06/08/2016   Formatting of this note might be different from the original. No longer sees  cardiolgist.  ST okay.  Imaging with calcium   Cough variant asthma 06/18/2013   - Methacholine challenge test 06/03/13 > equivocal but clinically caused tightness and made her cough - hfa 75% p coaching 06/17/13 > Trial of dulera 100 2bid started 06/18/2013     Degenerative cervical disc 06/08/2016   Depression    Diaphragmatic hernia    Dizziness 12/11/2019   Elbow tendonitis    Endometriosis    Gastric polyp    Gastroenteritis    GERD (gastroesophageal reflux disease)    Hiatal hernia 11/24/2013   High risk medication use 03/15/2015   History of colonic polyps 02/09/2019   History of hiatal hernia    pt. has been told that the hiatal hernia has reappeared   History of melanoma 06/17/2017   Hypercholesteremia    Hyperlipidemia    Hypertension    pt. reports that she use to take antihtn med, but reports since weight loss she has been able to come off.     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 Following with [facility].  [place].Neg PET scan   Malaise and fatigue 03/15/2015   Last Assessment & Plan:  Formatting of this note might be different from the original. Relevant Hx: Course: Daily Update: Today's Plan:she has some days better than others for her and she admits that with her allergies and asthma at times she is more tired as well as with the OA she has that creates some difficulty  Electronically signed by: Mayer Camel, NP 04/04/15 1058   Melanoma ([place]) 2011   heel   Mixed hyperlipidemia 03/15/2015   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   OSA (obstructive sleep apnea)    does not tolerate CPAP , pt. reports that its severe, but doesn't use it anymore, since weight loss.   Study done in [place], not sure where.    Osteoarthritis    Osteoporosis    PONV (postoperative nausea and  vomiting)    Prediabetes 11/18/2019   Primary insomnia 03/15/2015   Primary osteoarthritis involving multiple joints 03/15/2015   Last Assessment & Plan:  Formatting of this note might be different from the original. 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   Restless leg syndrome 11/18/2017   S/P knee replacement 01/29/2014   Seasonal allergic rhinitis due to pollen 03/17/2019   Sinus bradycardia 12/07/2019   Sinus bradycardia by electrocardiogram 12/07/2019  Sleep apnea    Upper airway cough syndrome 08/04/2012   Followed in Pulmonary clinic/ [facility]/ [place]  -[place]oz[place] eval around 2012 pos dust/ roach  Neg resp to singulair  - PFT's 02/2010 nl in effort dep portion of f/v loop, lung vol and dlcos also nl  - CT chest 05/27/12 wnl x small HH - 08/04/12   Alpha One AT >  MM - informed 09/10/2012 had not received demerol rx on 09/02/12  - sinus ct 10/23/2012 >>> Clear paranasal sinuses. - responded to neuronti   Vitamin B 12 deficiency    Vitamin D deficiency     Past Surgical History:  Procedure Laterality Date   ABDOMINAL HYSTERECTOMY     APPENDECTOMY     CHOLECYSTECTOMY     COLONOSCOPY  05/09/2015   Colonic Polyps, moderate sigmoid diverticulosis. Bx: Tubular Adenomas. Negative   DILATION AND CURETTAGE, DIAGNOSTIC / THERAPEUTIC     ESOPHAGEAL DILATION  08/2016   EYE SURGERY     cataracts removed / w [place]     HIATAL HERNIA REPAIR  2/14   HIATAL HERNIA REPAIR  2014   revision of 2018 in [place] ARTHROSCOPY     both knees for torn cartilage    NERVE REPAIR Left 07/11/2017   Procedure: Excision of saphenous  nerve left knee. Right knee intra-articular injection.;  Surgeon: Paralee Cancel, MD;  Location: [facility];  Service: Orthopedics;  Laterality: Left;  60 mins   OOPHORECTOMY     TONSILLECTOMY     TOTAL KNEE ARTHROPLASTY Left 01/29/2014   Procedure: LEFT TOTAL KNEE ARTHROPLASTY MCL REPAIR;  Surgeon: Augustin Schooling, MD;  Location: [facility];  Service: Orthopedics;  Laterality: Left;   VESICOVAGINAL FISTULA CLOSURE W/ TAH      Family History  Problem Relation Age of Onset   Emphysema Father        smoked   Heart disease Father    Arthritis Father    Asthma Mother    Colon cancer Mother    Arthritis Mother    Asthma Brother    Arthritis Brother    Breast cancer Sister    Breast cancer Maternal Grandmother    Heart disease Maternal Grandmother    Heart disease Paternal Grandmother    Heart disease Paternal Aunt     Social History   Tobacco Use   Smoking status: Never   Smokeless tobacco: Never  Vaping Use   Vaping Use: Never used  Substance Use Topics   Alcohol use: No    Comment: rare for holiday consumption only   Drug use: No    Current Outpatient Medications  Medication Sig Dispense Refill   Cholecalciferol (VITAMIN D3) 50 MCG (2000 UT) TABS Take 1 tablet by mouth daily.     dicyclomine (BENTYL) 10 MG capsule Take 10 mg by mouth every 6 (six) hours as needed (GI issues).     famotidine (PEPCID) 40 MG tablet Take 1 tablet (40 mg total) by mouth every evening. 30 tablet 5   fluticasone furoate-vilanterol (BREO ELLIPTA) 100-25 MCG/ACT AEPB TAKE 1 PUFF BY MOUTH EVERY DAY 60 each 5   levothyroxine (SYNTHROID, LEVOTHROID) 88 MCG tablet Take 88 mcg by mouth daily before breakfast.     LORazepam (ATIVAN) 0.5 MG tablet Take 0.5 mg by mouth at bedtime.  0   Melatonin 5 MG TABS Take 6 mg by mouth daily.     methocarbamol (ROBAXIN)  500 MG tablet Take 500 mg by mouth daily as needed for muscle spasms.     metoprolol tartrate (LOPRESSOR) 25 MG tablet Take 0.5 tablets (12.5 mg total) by  mouth 2 (two) times daily. 90 tablet 3   pantoprazole (PROTONIX) 40 MG tablet TAKE 1 TABLET BY MOUTH TWICE A DAY 60 tablet 5   vitamin B-12 (CYANOCOBALAMIN) 250 MCG tablet Take 250 mcg by mouth daily.     acetaminophen (TYLENOL) 500 MG tablet Take 500 mg by mouth every 6 (six) hours as needed for mild pain.      aspirin EC 81 MG tablet Take 81 mg by mouth daily as needed for mild pain (Chest Pain). Swallow whole.     famotidine (PEPCID) 20 MG tablet Take 1 tablet (20 mg total) by mouth at bedtime. 90 tablet 3   fluticasone (FLONASE) 50 MCG/ACT nasal spray Place 2 sprays into both nostrils daily. (Patient not taking: Reported on 08/03/2021)     promethazine (PHENERGAN) 12.5 MG tablet Take 12.5 mg by mouth every 6 (six) hours as needed for nausea or vomiting.     No current facility-administered medications for this visit.    Allergies  Allergen Reactions   Iodinated Contrast Media Rash   Neurontin [Gabapentin] Other (See Comments)    Severe falls,dizziness, forgetful    Ciprofloxacin Diarrhea   Codeine Nausea And Vomiting   Eggs Or Egg-Derived Products     GI upset per patient   Other Nausea And Vomiting    General anesthesia    Oxycodone Nausea And Vomiting   Tramadol     REACTION: GI discomfort with high doses   Cyclobenzaprine Palpitations   Sulfa Antibiotics Rash    Review of Systems:  Constitutional: Denies fever, chills, diaphoresis, appetite change and fatigue.  HEENT: Denies photophobia, eye pain, redness, hearing loss, ear pain, congestion, sore throat, rhinorrhea, sneezing, mouth sores, neck pain, neck stiffness and tinnitus.   Respiratory: Denies SOB, DOE.  Has chronic cough and has seen multiple physicians Cardiovascular: Denies chest pain, palpitations and leg swelling.  Genitourinary: Denies dysuria, urgency, frequency, hematuria, flank pain and difficulty urinating.  Musculoskeletal: Has myalgias, arthralgias and gait problem. Has arthritis and back pain Skin: No  rash.  Neurological: Denies dizziness, seizures, syncope, weakness, light-headedness, numbness and headaches.  Hematological: Denies adenopathy. Easy bruising, personal or family bleeding history  Psychiatric/Behavioral: Has anxiety or depression. Has sleeping problems.     Physical Exam:    BP (!) 142/64   Pulse (!) 57   Temp 97.8 F (36.6 C)   Ht '5\' 2"'$  (1.575 m)   Wt 141 lb (64 kg)   SpO2 100%   BMI 25.79 kg/m  Filed Weights   08/03/21 0935  Weight: 141 lb (64 kg)   Constitutional:  Well-developed, in no acute distress. Psychiatric: Normal mood and affect. Behavior is normal. HEENT: Pupils normal.  Conjunctivae are normal. No scleral icterus.  Has postnasal drip. Cardiovascular: Normal rate, regular rhythm. No edema Pulmonary/chest: Effort normal and breath sounds normal. No wheezing, rales or rhonchi. Abdominal: Soft, nondistended. Nontender. Bowel sounds active throughout. There are no masses palpable. No hepatomegaly. Rectal: Good rectal tone, heme-negative stools. Neurological: Alert and oriented to person place and time. Skin: Skin is warm and dry. No rashes noted.  Data Reviewed: I have personally reviewed following labs and imaging studies  CBC:    Latest Ref Rng & Units 09/09/2020    2:41 PM 07/08/2017    2:34 PM 05/08/2016    9:31  AM  CBC  WBC 4.0 - 10.5 K/uL 6.3  5.7  9.0   Hemoglobin 12.0 - 15.0 g/dL 13.4  13.6  14.7   Hematocrit 36.0 - 46.0 % 40.8  41.6  45.3   Platelets 150.0 - 400.0 K/uL 260.0  319  265     CMP:    Latest Ref Rng & Units 07/08/2017    2:34 PM 05/08/2016    9:31 AM 01/30/2014    4:55 AM  CMP  Glucose 70 - 99 mg/dL 88  81  123   BUN 8 - 23 mg/dL '14  14  9   '$ Creatinine 0.44 - 1.00 mg/dL 0.78  0.86  0.80   Sodium 135 - 145 mmol/L 142  142  134   Potassium 3.5 - 5.1 mmol/L 4.5  4.0  4.3   Chloride 98 - 111 mmol/L 110  109  100   CO2 22 - 32 mmol/L '26  25  27   '$ Calcium 8.9 - 10.3 mg/dL 9.2  9.6  8.6        Carmell Austria, MD 08/03/2021, 9:55  AM  Cc: Raina Mina., MD

## 2021-08-04 ENCOUNTER — Telehealth: Payer: Self-pay | Admitting: *Deleted

## 2021-08-04 ENCOUNTER — Telehealth: Payer: Self-pay | Admitting: Cardiology

## 2021-08-04 NOTE — Telephone Encounter (Signed)
Patient states she had a procedure and they mentioned she had afib in her diagnosis'. She states she did not know this and would like to know if Dr. Agustin Cree diagnosed her and how it is tested.

## 2021-08-04 NOTE — Telephone Encounter (Signed)
  Follow up Call-     08/03/2021    9:43 AM  Call back number  Post procedure Call Back phone  # [phone]  Permission to leave phone message Yes     Patient questions:  Do you have a fever, pain , or abdominal swelling? No. Pain Score  0 *  Have you tolerated food without any problems? Yes.    Have you been able to return to your normal activities? Yes.    Do you have any questions about your discharge instructions: Diet   No. Medications  No. Follow up visit  No.  Do you have questions or concerns about your Care? No.  Actions: * If pain score is 4 or above: No action needed, pain <4.

## 2021-08-07 NOTE — Telephone Encounter (Signed)
Spoke with pt regarding message and Dr. Wendy Poet comments. She verbalized understanding and had no further questions. Reminded of appt in October.

## 2021-08-10 ENCOUNTER — Telehealth: Payer: Self-pay | Admitting: Gastroenterology

## 2021-08-10 NOTE — Telephone Encounter (Signed)
Pt stated that she started having a sore throat the day after her procedure: Pt stated that she always coughs several times a day and one morning she had a small amount of blood on a tissue after she coughed. Pt stated that this was only one occurrence with the blood and it was a very small amount on the tissue:Pt was notified that her symptoms do not seemed to be related to her recent procedure: Pt was notified to reach out to her PCP in regard to here recent sore throat:  Pt verbalized understanding with all questions answered.

## 2021-08-10 NOTE — Telephone Encounter (Signed)
Inbound call from patient stating that she had an endoscopy on 8/3 with Dr. Lyndel Safe and she has had a sore throat since the procedure and has seen blood as well. Patient is requesting a call back to discuss. Please advise.

## 2021-08-11 ENCOUNTER — Encounter: Payer: Self-pay | Admitting: Gastroenterology

## 2021-08-17 NOTE — Telephone Encounter (Signed)
Inbound call from patient inquiring about results from previous procedure 08/03/21. Please give a call to further advise.  Thank you

## 2021-08-17 NOTE — Telephone Encounter (Signed)
Pt requesting results from recent procedure: Pt was notified of Letter that was created by Dr. Lyndel Safe. Letter read aloud to pt and also sent to pt via mail. Pt made aware: Pt stated that Dr. Lyndel Safe informed her to take the pantoprazole twice a day and the pepcid once at night for one month for cough. Pt stated that she has been taking the pantoprazole twice a day and the pepcid once at night for two months with no improvement:Pt questioning if she should continue taking the pantoprazole twice a day and the pepcid once at night:  Please advise

## 2021-08-19 NOTE — Telephone Encounter (Signed)
Pl continue RG

## 2021-08-21 NOTE — Telephone Encounter (Signed)
Pt made aware of Dr. Gupta recommendations: Pt verbalized understanding with all questions answered.   

## 2021-08-23 ENCOUNTER — Telehealth: Payer: Self-pay | Admitting: Pulmonary Disease

## 2021-08-23 NOTE — Telephone Encounter (Signed)
Dr. Vaughan Browner, please advise on the results of pt's CT which was performed 8/7. Scan is able to be seen in PACS.

## 2021-08-25 NOTE — Telephone Encounter (Signed)
Called and spoke with pt letting her know that we are still waiting on Dr. Vaughan Browner to review the CT and stated to her as soon as he reviewed it, we would call her back with the results and she verbalized understanding.  Routing back to Dr. Vaughan Browner.

## 2021-08-28 NOTE — Telephone Encounter (Signed)
CT looks good with no significant lung issues.  There is stable lung nodules which is good news I will review in detail at time of return visit

## 2021-08-28 NOTE — Telephone Encounter (Signed)
Called and spoke with pt letting her know the results of CT per Dr. Vaughan Browner and she verbalized understanding. Nothing further needed.

## 2021-08-29 DIAGNOSIS — N644 Mastodynia: Secondary | ICD-10-CM | POA: Insufficient documentation

## 2021-08-29 HISTORY — DX: Mastodynia: N64.4

## 2021-09-15 ENCOUNTER — Ambulatory Visit (INDEPENDENT_AMBULATORY_CARE_PROVIDER_SITE_OTHER): Payer: Medicare Other | Admitting: Pulmonary Disease

## 2021-09-15 ENCOUNTER — Encounter: Payer: Self-pay | Admitting: Pulmonary Disease

## 2021-09-15 VITALS — BP 136/76 | HR 71 | Temp 97.6°F | Ht 61.5 in | Wt 142.0 lb

## 2021-09-15 DIAGNOSIS — G4733 Obstructive sleep apnea (adult) (pediatric): Secondary | ICD-10-CM | POA: Diagnosis not present

## 2021-09-15 DIAGNOSIS — R053 Chronic cough: Secondary | ICD-10-CM

## 2021-09-15 NOTE — Patient Instructions (Signed)
Your CT scan shows stable lung nodule which is good news.  There is no evidence of interstitial lung disease Continue breo Follow-up in 6 months

## 2021-09-15 NOTE — Progress Notes (Signed)
[name]    [id number]    1945/06/15  Primary Care Physician:Grisso, Clarita Crane., MD  Referring Physician: Raina Mina., MD [place],  [place] [id number]  Chief complaint: Follow-up for chronic cough  HPI: 76 year old with chronic cough likely secondary to LPR, GERD, hiatal hernia, postnasal drip.  Previously followed by Dr. Lenna Gilford.  Was told she had asthma in the past and was tried on multiple inhalers which did not help  Complains of chronic cough, nonproductive in nature.  No associated dyspnea, sputum production, fevers, chills She has seen Dr. Neldon Mc who has put her on double antiacid therapy with PPI and H2 blocker, chlorpheniramine and Flonase for postnasal drip.  She does note some improvement in her cough.  She developed COVID-19 in July 2022 treated with Paxlovid with as an outpatient Recent high-resolution CT for follow-up of lung nodules showed stable lung nodules with no clear evidence of interstitial lung disease Per notes she has had extensive ENT evaluation without any abnormality noted  Pets: Dogs, outside cat and horse Occupation: Microbiologist.  Works part-time Exposures: No mold, hot tub, Customer service manager.  She does have down comforter but uses it only intermittently Smoking history: Never smoker Travel history: Originally from [place].  No significant travel history Relevant family history: Brother and mother had asthma.   Interim history: She is here for follow-up for chronic cough Continues on double antiacid therapy.  Recently had EGD by Dr. Lyndel Safe  CT last month shows stable lung nodules with no evidence of interstitial lung disease  Outpatient Encounter Medications as of 09/15/2021  Medication Sig   acetaminophen (TYLENOL) 500 MG tablet Take 500 mg by mouth every 6 (six) hours as needed for mild pain.    amoxicillin (AMOXIL) 500 MG capsule Take 500 mg by mouth 2 (two) times daily. Take twice daily for 7 days   aspirin EC 81 MG tablet  Take 81 mg by mouth daily as needed for mild pain (Chest Pain). Swallow whole.   Cholecalciferol (VITAMIN D3) 50 MCG (2000 UT) TABS Take 1 tablet by mouth daily.   dicyclomine (BENTYL) 10 MG capsule Take 10 mg by mouth every 6 (six) hours as needed (GI issues).   famotidine (PEPCID) 20 MG tablet Take 1 tablet (20 mg total) by mouth at bedtime.   fluticasone furoate-vilanterol (BREO ELLIPTA) 100-25 MCG/ACT AEPB TAKE 1 PUFF BY MOUTH EVERY DAY   levothyroxine (SYNTHROID, LEVOTHROID) 88 MCG tablet Take 88 mcg by mouth daily before breakfast.   LORazepam (ATIVAN) 0.5 MG tablet Take 0.5 mg by mouth at bedtime.   Melatonin 5 MG TABS Take 6 mg by mouth daily.   methocarbamol (ROBAXIN) 500 MG tablet Take 500 mg by mouth daily as needed for muscle spasms.   metoprolol tartrate (LOPRESSOR) 25 MG tablet Take 0.5 tablets (12.5 mg total) by mouth 2 (two) times daily.   pantoprazole (PROTONIX) 40 MG tablet TAKE 1 TABLET BY MOUTH TWICE A DAY   predniSONE (DELTASONE) 20 MG tablet Take 500 mg by mouth daily with breakfast. Take 2 tablets by mouth every day for five days   promethazine (PHENERGAN) 12.5 MG tablet Take 12.5 mg by mouth every 6 (six) hours as needed for nausea or vomiting.   promethazine-phenylephrine (PROMETHAZINE VC) 6.25-5 MG/5ML SYRP Take 5 mLs by mouth every 4 (four) hours as needed for congestion.   vitamin B-12 (CYANOCOBALAMIN) 250 MCG tablet Take 250 mcg by mouth daily.   famotidine (PEPCID)  40 MG tablet Take 1 tablet (40 mg total) by mouth every evening. (Patient not taking: Reported on 09/15/2021)   fluticasone (FLONASE) 50 MCG/ACT nasal spray Place 2 sprays into both nostrils daily. (Patient not taking: Reported on 08/03/2021)   No facility-administered encounter medications on file as of 09/15/2021.    Physical Exam: Blood pressure 136/76, pulse 71, temperature 97.6 F (36.4 C), temperature source Oral, height 5' 1.5" (1.562 m), weight 142 lb (64.4 kg), SpO2 98 %. Gen:      No acute  distress HEENT:  EOMI, sclera anicteric Neck:     No masses; no thyromegaly Lungs:    Clear to auscultation bilaterally; normal respiratory effort CV:         Regular rate and rhythm; no murmurs Abd:      + bowel sounds; soft, non-tender; no palpable masses, no distension Ext:    No edema; adequate peripheral perfusion Skin:      Warm and dry; no rash Neuro: alert and oriented x 3 Psych: normal mood and affect   Data Reviewed: Imaging: High-resolution CT 08/19/20-no lung nodules, no significant interstitial lung disease. Subtle findings at lung base suggestive of atelectasis versus ground glass opacities/  High-resolution CT chest 08/07/2021-stable lung nodules no significant interstitial lung disease.  PFTs: 02/03/2021 FVC 2.46 [90%], FEV1 1.91 [93%], F/F 77, TLC 4.73 [96%], DLCO 20.46 [110%] No obstruction but there is significant bronchodilator response and air trapping suggestive of airway disease.  Labs: RAST panel 03/12/2017- negative IgE 09/09/2020- 76 CBC 09/09/2020-WBC 6.3, eos 1.8%, absolute eosinophil count 113  Hypersensitivity pneumonitis 09/09/2020-negative  Assessment:  Follow-up for cough Moderate persistent asthma Likely has to ongoing LPR, GERD in the setting of hiatal hernia, postnasal drip She continues onPPI, H2 blocker and first-generation antihistamine, Flonase  PFTs reviewed with significant bronchodilator response and not trapping suggestive of small airways disease and asthma phenotype though she does not have peripheral eosinophils or IgE elevation.  Asthma is also supported by the fact that steroids make her cough better  Continue Breo  GERD Continues to have GERD symptoms in spite of double therapy with PPI and anti-H2 Follows with Dr. Lyndel Safe from gastroenterology  Concern for ILD She has occasional exposure to down pillows We reviewed the CT scan on ILD conference on 10/07/2020.  Consensus was that there is no significant interstitial lung disease with  very subtle findings at the base.  Possibility was raised of [place] syndrome but not conclusive.    Follow up CT reviewed with no significant interstitial lung disease  OSA She has been nontolerant with CPAP and been off therapy since 2015.  She has lost a lot of weight in the interim and does not want to retry CPAP  Plan/Recommendations: Continue Breo Return to clinic in 6 months.  Marshell Garfinkel MD [facility] 09/15/2021, 4:06 PM  CC: Raina Mina., MD

## 2021-10-24 ENCOUNTER — Encounter: Payer: Self-pay | Admitting: Cardiology

## 2021-10-24 ENCOUNTER — Ambulatory Visit: Payer: Medicare Other | Attending: Cardiology | Admitting: Cardiology

## 2021-10-24 VITALS — BP 142/70 | HR 88 | Ht 62.0 in | Wt 142.0 lb

## 2021-10-24 DIAGNOSIS — F5101 Primary insomnia: Secondary | ICD-10-CM | POA: Diagnosis present

## 2021-10-24 DIAGNOSIS — I10 Essential (primary) hypertension: Secondary | ICD-10-CM | POA: Diagnosis not present

## 2021-10-24 DIAGNOSIS — R001 Bradycardia, unspecified: Secondary | ICD-10-CM | POA: Diagnosis present

## 2021-10-24 DIAGNOSIS — I2510 Atherosclerotic heart disease of native coronary artery without angina pectoris: Secondary | ICD-10-CM | POA: Insufficient documentation

## 2021-10-24 DIAGNOSIS — E782 Mixed hyperlipidemia: Secondary | ICD-10-CM | POA: Insufficient documentation

## 2021-10-24 DIAGNOSIS — I471 Supraventricular tachycardia, unspecified: Secondary | ICD-10-CM | POA: Insufficient documentation

## 2021-10-24 NOTE — Progress Notes (Signed)
Cardiology Office Note:    Date:  10/24/2021   ID:  [name], DOB [birth year], MRN [id number]  PCP:  Raina Mina., MD  Cardiologist:  Jenne Campus, MD    Referring MD: Raina Mina., MD   No chief complaint on file. Doing fine  History of Present Illness:    [name] is a 76 y.o. female with past medical history significant for sinus bradycardia, supraventricular tachycardia, essential hypertension, dyslipidemia.  She comes today 2 months for follow-up.  Overall doing well.  She denies have any chest pain tightness squeezing pressure burning chest.  She is tolerating metoprolol with no difficulties  Past Medical History:  Diagnosis Date   Abnormal chest x-ray with multiple lung nodules 08/22/2017   Abnormal weight loss    Age-related osteoporosis without current pathological fracture 03/15/2015   Last Assessment & Plan:  Formatting of this note might be different from the original. Relevant Hx: Course: Daily Update: Today's Plan:discussed in depth with her and she is going to try the boniva when she gets back from her [place] trip, and she was advised to increase her vitamin D 3 to 3000 IU daily after review of her lab and we reviewed her DEXA last year and she will be due for this in 2018  El   Allergy    Alopecia    Anemia    Anxiety    antianxiety med. usually at night but can take during the day for anxiety   Arthralgia of hip 10/26/2019   Arthritis    both knees & hands  also the back & neck   Asthma    Atrial fibrillation ([place])    pt. reports that [facility] that she had a "slight afib.", no further test needed (01/07/14 cardiology note does not mention any afib, just poor anterior r wave progression)   Breast pain, right 08/29/2021   Cataract    Cervical radiculopathy 01/19/2017   Chronic cough    Chronic rhinitis 12/04/2012   Followed in Pulmonary clinic/ [facility]/ [facility]   - sinus ct 10/23/2012 >>> Clear paranasal sinuses. - trial of qid 1st gen H1  02/12/2013 > not effective 6/46/80        Complication of anesthesia    Coronary artery disease involving native coronary artery of native heart without angina pectoris 06/08/2016   Formatting of this note might be different from the original. No longer sees cardiolgist.  ST okay.  Imaging with calcium   Cough variant asthma 06/18/2013   - Methacholine challenge test 06/03/13 > equivocal but clinically caused tightness and made her cough - hfa 75% p coaching 06/17/13 > Trial of dulera 100 2bid started 06/18/2013     Degenerative cervical disc 06/08/2016   Depression    Diaphragmatic hernia    Dizziness 12/11/2019   Elbow tendonitis    Endometriosis    Gastric polyp    Gastroenteritis    GERD (gastroesophageal reflux disease)    Hiatal hernia 11/24/2013   High risk medication use 03/15/2015   History of colonic polyps 02/09/2019   History of hiatal hernia    pt. has been told that the hiatal hernia has reappeared   History of melanoma 06/17/2017   Hypercholesteremia    Hyperlipidemia    Hypertension    pt. reports that she use to take antihtn med, but reports since weight loss she has been able to come off.     Hypothyroidism    Insomnia    Irritable bowel syndrome  with diarrhea 09/03/2019   Laryngopharyngeal reflux (LPR) 07/19/2014   Left lumbar pain 09/19/2020   Lung nodules 09/16/2017   Formatting of this note might be different from the original. Following with [facility].  Stabel.Neg PET scan   Malaise and fatigue 03/15/2015   Last Assessment & Plan:  Formatting of this note might be different from the original. Relevant Hx: Course: Daily Update: Today's Plan:she has some days better than others for her and she admits that with her allergies and asthma at times she is more tired as well as with the OA she has that creates some difficulty  Electronically signed by: Mayer Camel, NP 04/04/15 1058   Melanoma ([place]) 2011   heel   Mixed hyperlipidemia 03/15/2015    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   OSA (obstructive sleep apnea)    does not tolerate CPAP , pt. reports that its severe, but doesn't use it anymore, since weight loss.   Study done in [place], not sure where.    Osteoarthritis    Osteoporosis    PONV (postoperative nausea and vomiting)    Prediabetes 11/18/2019   Primary insomnia 03/15/2015   Primary osteoarthritis involving multiple joints 03/15/2015   Last Assessment & Plan:  Formatting of this note might be different from the original. 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   Restless leg syndrome 11/18/2017   Right kidney mass 05/31/2021   S/P knee replacement 01/29/2014   Seasonal allergic rhinitis due to pollen 03/17/2019   Sinus bradycardia 12/07/2019   Sinus bradycardia by electrocardiogram 12/07/2019   Sleep apnea    Upper airway cough syndrome 08/04/2012   Followed in Pulmonary clinic/ [facility]/ [facility]  -Kozlow eval around 2012 pos dust/ roach  Neg resp to singulair  - PFT's 02/2010 nl in effort dep portion of f/v loop, lung vol and dlcos also nl  - CT chest 05/27/12 wnl x small HH - 08/04/12   Alpha One AT >  MM - informed 09/10/2012 had not received demerol rx on 09/02/12  - sinus ct 10/23/2012 >>> Clear paranasal sinuses. - responded to neuronti   Vitamin B 12 deficiency    Vitamin D deficiency     Past Surgical History:  Procedure Laterality Date   ABDOMINAL HYSTERECTOMY     APPENDECTOMY     CHOLECYSTECTOMY     COLONOSCOPY  05/09/2015   Colonic Polyps, moderate sigmoid diverticulosis. Bx: Tubular Adenomas. Negative   DILATION AND CURETTAGE, DIAGNOSTIC / THERAPEUTIC     ESOPHAGEAL  DILATION  08/2016   EYE SURGERY     cataracts removed / w [place]     HIATAL HERNIA REPAIR  2/14   HIATAL HERNIA REPAIR  2014   revision of 2018 in [place] [facility]THROSCOPY     both knees for torn cartilage    NERVE REPAIR Left 07/11/2017   Procedure: Excision of saphenous nerve left knee. Right knee intra-articular injection.;  Surgeon: Paralee Cancel, MD;  Location: [facility];  Service: Orthopedics;  Laterality: Left;  60 mins   OOPHORECTOMY     TONSILLECTOMY     TOTAL KNEE ARTHROPLASTY  Left 01/29/2014   Procedure: LEFT TOTAL KNEE ARTHROPLASTY MCL REPAIR;  Surgeon: Augustin Schooling, MD;  Location: [place];  Service: Orthopedics;  Laterality: Left;   VESICOVAGINAL FISTULA CLOSURE W/ TAH      Current Medications: Current Meds  Medication Sig   acetaminophen (TYLENOL) 500 MG tablet Take 500 mg by mouth every 6 (six) hours as needed for mild pain.    aspirin EC 81 MG tablet Take 81 mg by mouth daily as needed for mild pain (Chest Pain). Swallow whole.   Cholecalciferol (VITAMIN D3) 50 MCG (2000 UT) TABS Take 1 tablet by mouth daily.   dicyclomine (BENTYL) 10 MG capsule Take 10 mg by mouth every 6 (six) hours as needed for spasms (GI issues).   famotidine (PEPCID) 40 MG tablet Take 1 tablet (40 mg total) by mouth every evening.   fluticasone (FLONASE) 50 MCG/ACT nasal spray Place 2 sprays into both nostrils daily.   fluticasone furoate-vilanterol (BREO ELLIPTA) 100-25 MCG/ACT AEPB TAKE 1 PUFF BY MOUTH EVERY DAY (Patient taking differently: Inhale 1 puff into the lungs daily.)   levothyroxine (SYNTHROID, LEVOTHROID) 88 MCG tablet Take 88 mcg by mouth daily before breakfast.   LORazepam (ATIVAN) 0.5 MG tablet Take 0.5 mg by mouth at bedtime.   Melatonin 5 MG TABS Take 6 mg by mouth daily.   methocarbamol (ROBAXIN) 500 MG tablet Take 500 mg by mouth daily as needed for muscle spasms.   metoprolol tartrate (LOPRESSOR) 25 MG tablet Take 0.5 tablets (12.5 mg  total) by mouth 2 (two) times daily.   pantoprazole (PROTONIX) 40 MG tablet TAKE 1 TABLET BY MOUTH TWICE A DAY (Patient taking differently: Take 40 mg by mouth daily.)   promethazine (PHENERGAN) 12.5 MG tablet Take 12.5 mg by mouth every 6 (six) hours as needed for nausea or vomiting.   promethazine-phenylephrine (PROMETHAZINE VC) 6.25-5 MG/5ML SYRP Take 5 mLs by mouth every 4 (four) hours as needed for congestion.   vitamin B-12 (CYANOCOBALAMIN) 250 MCG tablet Take 250 mcg by mouth daily.     Allergies:   Iodinated contrast media, Neurontin [gabapentin], Cefdinir, Ciprofloxacin, Codeine, Eggs or egg-derived products, Misc. 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   Tobacco Use   Smoking status: Never   Smokeless tobacco: Never  Scientific laboratory technician Use: Never used  Substance and Sexual Activity   Alcohol use: No    Comment: rare for holiday consumption only   Drug use: No   Sexual activity: Not on file  Other Topics Concern   Not on file  Social History Narrative   Lives with husband   Retired   Investment banker, operational of Radio broadcast assistant Strain: Not on file  Food Insecurity: Not on file  Transportation Needs: Not on file  Physical Activity: Not on file  Stress: Not on file  Social Connections: Not on file     Family History: The patient's family history includes Arthritis in her brother, father, and mother; Asthma in her brother and mother; Breast cancer in her maternal grandmother and sister; Colon cancer in her mother; Emphysema in her father; Heart disease  in her father, maternal grandmother, paternal aunt, and  paternal grandmother. ROS:   Please see the history of present illness.    All 14 point review of systems negative except as described per history of present illness  EKGs/Labs/Other Studies Reviewed:      Recent Labs: No results found for requested labs within last 365 days.  Recent Lipid Panel No results found for: "CHOL", "TRIG", "HDL", "CHOLHDL", "VLDL", "LDLCALC", "LDLDIRECT"  Physical Exam:    VS:  BP (!) 142/70 (BP Location: Right Arm, Patient Position: Sitting)   Pulse 88   Ht '5\' 2"'$  (1.575 m)   Wt 142 lb (64.4 kg)   BMI 25.97 kg/m     Wt Readings from Last 3 Encounters:  10/24/21 142 lb (64.4 kg)  09/15/21 142 lb (64.4 kg)  08/03/21 141 lb (64 kg)     GEN:  Well nourished, well developed in no acute distress HEENT: Normal NECK: No JVD; No carotid bruits LYMPHATICS: No lymphadenopathy CARDIAC: RRR, no murmurs, no rubs, no gallops RESPIRATORY:  Clear to auscultation without rales, wheezing or rhonchi  ABDOMEN: Soft, non-tender, non-distended MUSCULOSKELETAL:  No edema; No deformity  SKIN: Warm and dry LOWER EXTREMITIES: no swelling NEUROLOGIC:  Alert and oriented x 3 PSYCHIATRIC:  Normal affect   ASSESSMENT:    1. Coronary artery disease involving native coronary artery of native heart without angina pectoris   2. Sinus bradycardia   3. Primary hypertension   4. SVT (supraventricular tachycardia)   5. Mixed hyperlipidemia   6. Primary insomnia    PLAN:    In order of problems listed above:  Coronary disease: I am not sure exactly what his diagnosis came from.  I will review her chart and see any evidence of that.  We will take it away Sinus bradycardia: Denies having dizziness or passing out EKG today show sinus rhythm at rate of 64.  We will continue present management. Supraventricular tachycardia suppressed with small dose of beta-blocker and she send very rare palpitations lasting for short period of time. Dyslipidemia, I do not have her last  fasting lipid profile   Medication Adjustments/Labs and Tests Ordered: Current medicines are reviewed at length with the patient today.  Concerns regarding medicines are outlined above.  No orders of the defined types were placed in this encounter.  Medication changes: No orders of the defined types were placed in this encounter.   Signed, Park Liter, MD, [facility] 10/24/2021 2:08 PM    [facility]

## 2021-10-24 NOTE — Patient Instructions (Addendum)
Medication Instructions:  Your physician recommends that you continue on your current medications as directed. Please refer to the Current Medication list given to you today.  *If you need a refill on your cardiac medications before your next appointment, please call your pharmacy*   Lab Work: None Ordered If you have labs (blood work) drawn today and your tests are completely normal, you will receive your results only by: [place] (if you have MyChart) OR A paper copy in the mail If you have any lab test that is abnormal or we need to change your treatment, we will call you to review the results.   Testing/Procedures:    Follow-Up: At [facility], you and your health needs are our priority.  As part of our continuing mission to provide you with exceptional heart care, we have created designated Provider Care Teams.  These Care Teams include your primary Cardiologist (physician) and Advanced Practice Providers (APPs -  Physician Assistants and Nurse Practitioners) who all work together to provide you with the care you need, when you need it.  We recommend signing up for the patient portal called "MyChart".  Sign up information is provided on this After Visit Summary.  MyChart is used to connect with patients for Virtual Visits (Telemedicine).  Patients are able to view lab/test results, encounter notes, upcoming appointments, etc.  Non-urgent messages can be sent to your provider as well.   To learn more about what you can do with MyChart, go to [email or web address].    Your next appointment:   12 month(s)  The format for your next appointment:   In Person  Provider:   Jenne Campus, MD    Other Instructions NA

## 2021-11-21 ENCOUNTER — Telehealth: Payer: Self-pay | Admitting: *Deleted

## 2021-11-21 MED ORDER — FLUTICASONE FUROATE-VILANTEROL 100-25 MCG/ACT IN AEPB: 1.0000 | INHALATION_SPRAY | Freq: Every day | RESPIRATORY_TRACT | 11 refills | Status: DC

## 2021-11-21 NOTE — Telephone Encounter (Signed)
Printed Rx for [employer] to fax to [place]. Will have Dr Vaughan Browner sign and fax over to [facility] when ready. Nothing further needed

## 2021-12-12 NOTE — Telephone Encounter (Signed)
Patient is calling again and said that [name] still does not have a record of the [facility] prescription.  Please call patient to give her an update.  [phone]

## 2021-12-12 NOTE — Telephone Encounter (Signed)
PT said she was waiting for a CB from [place]. Did not know Memory Dance was called in. Adv check w/pharm and call us if issues.

## 2021-12-14 ENCOUNTER — Telehealth: Payer: Self-pay | Admitting: Pulmonary Disease

## 2021-12-14 MED ORDER — FLUTICASONE FUROATE-VILANTEROL 100-25 MCG/ACT IN AEPB: 1.0000 | INHALATION_SPRAY | Freq: Every day | RESPIRATORY_TRACT | 3 refills | Status: DC

## 2021-12-14 NOTE — Telephone Encounter (Signed)
Called and spoke with patient.  Patient requested Breo prescription be sent to [place] patient assistance fax # [phone]. Prescription faxed and confirmation received.  Nothing further at this time.

## 2022-01-26 ENCOUNTER — Other Ambulatory Visit: Payer: Self-pay | Admitting: Cardiology

## 2022-01-26 NOTE — Telephone Encounter (Signed)
Rx refill sent to pharmacy. 

## 2022-04-04 ENCOUNTER — Ambulatory Visit (INDEPENDENT_AMBULATORY_CARE_PROVIDER_SITE_OTHER): Payer: Medicare Other | Admitting: Podiatry

## 2022-04-04 ENCOUNTER — Ambulatory Visit (INDEPENDENT_AMBULATORY_CARE_PROVIDER_SITE_OTHER): Payer: Medicare Other

## 2022-04-04 ENCOUNTER — Encounter: Payer: Self-pay | Admitting: Podiatry

## 2022-04-04 DIAGNOSIS — R52 Pain, unspecified: Secondary | ICD-10-CM

## 2022-04-04 DIAGNOSIS — M722 Plantar fascial fibromatosis: Secondary | ICD-10-CM | POA: Diagnosis not present

## 2022-04-04 DIAGNOSIS — M5416 Radiculopathy, lumbar region: Secondary | ICD-10-CM

## 2022-04-04 MED ORDER — MELOXICAM 7.5 MG PO TABS: 7.5000 mg | ORAL_TABLET | Freq: Every day | ORAL | 0 refills | Status: DC

## 2022-04-04 NOTE — Progress Notes (Addendum)
Subjective:  Patient ID: [name], female    DOB: June 06, 1945,   MRN: [id number]  Chief Complaint  Patient presents with   Ankle Pain    bil sharp pain in ankles, bil tingling & pain in the bottom feet - hurts to walk, more painful at night when laying down, has hx of pf    77 y.o. female presents for concern of bilateral burning and tingling pain in her ankle ball of foot and heels. Relates it has been painful to walk and been getting worse for about the past month. Relates most pain at night when lying down. Has had pf in the past. Relates she is following up soon for lower back pain that she has. She is not diabetic.  Marland Kitchen Denies any other pedal complaints. Denies n/v/f/c.   Past Medical History:  Diagnosis Date   Abnormal chest x-ray with multiple lung nodules 08/22/2017   Abnormal weight loss    Age-related osteoporosis without current pathological fracture 03/15/2015   Last Assessment & Plan:  Formatting of this note might be different from the original. Relevant Hx: Course: Daily Update: Today's Plan:discussed in depth with her and she is going to try the boniva when she gets back from her [place] trip, and she was advised to increase her vitamin D 3 to 3000 IU daily after review of her lab and we reviewed her DEXA last year and she will be due for this in 2018  El   Allergy    Alopecia    Anemia    Anxiety    antianxiety med. usually at night but can take during the day for anxiety   Arthralgia of hip 10/26/2019   Arthritis    both knees & hands  also the back & neck   Asthma    Atrial fibrillation    pt. reports that [facility] that she had a "slight afib.", no further test needed (01/07/14 cardiology note does not mention any afib, just poor anterior r wave progression)   Breast pain, right 08/29/2021   Cataract    Cervical radiculopathy 01/19/2017   Chronic cough    Chronic rhinitis 12/04/2012   Followed in Pulmonary clinic/ [facility]/ [facility]   - sinus ct 10/23/2012 >>>  Clear paranasal sinuses. - trial of qid 1st gen H1 02/12/2013 > not effective [id number]        Complication of anesthesia    Coronary artery disease involving native coronary artery of native heart without angina pectoris 06/08/2016   Formatting of this note might be different from the original. No longer sees cardiolgist.  ST okay.  Imaging with calcium   Cough variant asthma 06/18/2013   - Methacholine challenge test 06/03/13 > equivocal but clinically caused tightness and made her cough - hfa 75% p coaching 06/17/13 > Trial of dulera 100 2bid started 06/18/2013     Degenerative cervical disc 06/08/2016   Depression    Diaphragmatic hernia    Dizziness 12/11/2019   Elbow tendonitis    Endometriosis    Gastric polyp    Gastroenteritis    GERD (gastroesophageal reflux disease)    Hiatal hernia 11/24/2013   High risk medication use 03/15/2015   History of colonic polyps 02/09/2019   History of hiatal hernia    pt. has been told that the hiatal hernia has reappeared   History of melanoma 06/17/2017   Hypercholesteremia    Hyperlipidemia    Hypertension    pt. reports that she use to take antihtn med,  but reports since weight loss she has been able to come off.     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 Following with [facility].  Stabel.Neg PET scan   Malaise and fatigue 03/15/2015   Last Assessment & Plan:  Formatting of this note might be different from the original. 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 reports that its severe, but doesn't use it anymore, since weight loss.   Study done in [place], not sure where.    Osteoarthritis    Osteoporosis    PONV (postoperative nausea and vomiting)    Prediabetes 11/18/2019   Primary insomnia 03/15/2015   Primary osteoarthritis involving multiple joints 03/15/2015   Last Assessment & Plan:  Formatting of this note might be different from the original. 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   Restless leg syndrome 11/18/2017   Right kidney mass 05/31/2021   S/P knee replacement 01/29/2014   Seasonal allergic rhinitis due to pollen 03/17/2019   Sinus bradycardia 12/07/2019   Sinus bradycardia by electrocardiogram 12/07/2019   Sleep apnea    Upper airway cough syndrome 08/04/2012   [facility]  -Kozlow eval around 2012 pos dust/ roach  Neg resp to singulair  - PFT's 02/2010 nl in effort dep portion of f/v loop, lung vol and dlcos also nl  - CT chest 05/27/12 wnl x small HH - 08/04/12   Alpha One AT >  MM - informed 09/10/2012 had not received demerol rx on 09/02/12  - sinus ct 10/23/2012 >>> Clear paranasal sinuses. - responded to neuronti   Vitamin B 12 deficiency    Vitamin D deficiency     Objective:  Physical Exam: Vascular: DP/PT pulses 2/4 bilateral. CFT <3 seconds. Normal hair growth on digits. No edema.  Skin. No lacerations or abrasions bilateral feet.  Musculoskeletal: MMT 5/5 bilateral lower extremities in DF, PF, Inversion and Eversion.  Deceased ROM in DF of ankle joint. Tender to bilateral plantar medial calcaneal tubercles. No pain elsewhere to palpation about the foot. Negative tinels.  Neurological: Sensation intact to light touch.   Assessment:   1. Bilateral plantar fasciitis   2. Lumbar radiculopathy      Plan:  Patient was evaluated and treated and all questions answered. Discussed plantar fasciitis with patient. Discussed possible back pain contributing to some of the nerve type pain she is getting as well X-rays reviewed and discussed with patient. No acute fractures or dislocations noted. Mild spurring noted at inferior calcaneus.  Discussed  treatment options including, ice, NSAIDS, supportive shoes, bracing, and stretching. Stretching exercises provided to be done on a daily basis.   Prescription for meloxicam provided and sent to pharmacy. Reviewed CMP. Cr a little up will do short course of anti-inflammatory Pf brace disepensed.    Follow-up 6 weeks or sooner if any problems arise. In the meantime, encouraged to call the office with any questions, concerns, change in symptoms.     Lorenda Peck, DPM

## 2022-04-04 NOTE — Patient Instructions (Signed)

## 2022-05-14 ENCOUNTER — Ambulatory Visit (INDEPENDENT_AMBULATORY_CARE_PROVIDER_SITE_OTHER): Payer: Medicare Other | Admitting: Podiatry

## 2022-05-14 DIAGNOSIS — M722 Plantar fascial fibromatosis: Secondary | ICD-10-CM | POA: Diagnosis not present

## 2022-05-14 DIAGNOSIS — M5416 Radiculopathy, lumbar region: Secondary | ICD-10-CM

## 2022-05-14 NOTE — Progress Notes (Signed)
Subjective:  Patient ID: [name], female    DOB: 03-15-1945,  MRN: [id number]  Chief Complaint  Patient presents with   Follow-up    Bilateral plantar fasciitis     77 y.o. female presents for follow-up of bilateral plantar fasciitis.  She states that she has not seen any improvement since last visit she is try taking meloxicam without improvement.  She has previously taken gabapentin for nerve pain and neuropathic pain however she does not like to take it anymore.  She does state that she has a upcoming appointment to have a steroid injection in her lower back for neuropathic pain [place] up in the future.   Past Medical History:  Diagnosis Date   Abnormal chest x-ray with multiple lung nodules 08/22/2017   Abnormal weight loss    Age-related osteoporosis without current pathological fracture 03/15/2015   Last Assessment & Plan:  Formatting of this note might be different from the original. Relevant Hx: Course: Daily Update: Today's Plan:discussed in depth with her and she is going to try the boniva when she gets back from her [place] trip, and she was advised to increase her vitamin D 3 to 3000 IU daily after review of her lab and we reviewed her DEXA last year and she will be due for this in 2018  El   Allergy    Alopecia    Anemia    Anxiety    antianxiety med. usually at night but can take during the day for anxiety   Arthralgia of hip 10/26/2019   Arthritis    both knees & hands  also the back & neck   Asthma    Atrial fibrillation (HCC)    pt. reports that [facility] that she had a "slight afib.", no further test needed (01/07/14 cardiology note does not mention any afib, just poor anterior r wave progression)   Breast pain, right 08/29/2021   Cataract    Cervical radiculopathy 01/19/2017   Chronic cough    Chronic rhinitis 12/04/2012   Followed in Pulmonary clinic/ [facility]/ [place]   - sinus ct 10/23/2012 >>> Clear paranasal sinuses. - trial of qid 1st gen H1 02/12/2013  > not effective 05/28/13        Complication of anesthesia    Coronary artery disease involving native coronary artery of native heart without angina pectoris 06/08/2016   Formatting of this note might be different from the original. No longer sees cardiolgist.  ST okay.  Imaging with calcium   Cough variant asthma 06/18/2013   - Methacholine challenge test 06/03/13 > equivocal but clinically caused tightness and made her cough - hfa 75% p coaching 06/17/13 > Trial of dulera 100 2bid started 06/18/2013     Degenerative cervical disc 06/08/2016   Depression    Diaphragmatic hernia    Dizziness 12/11/2019   Elbow tendonitis    Endometriosis    Gastric polyp    Gastroenteritis    GERD (gastroesophageal reflux disease)    Hiatal hernia 11/24/2013   High risk medication use 03/15/2015   History of colonic polyps 02/09/2019   History of hiatal hernia    pt. has been told that the hiatal hernia has reappeared   History of melanoma 06/17/2017   Hypercholesteremia    Hyperlipidemia    Hypertension    pt. reports that she use to take antihtn med, but reports since weight loss she has been able to come off.     Hypothyroidism    Insomnia  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   Laryngopharyngeal reflux (LPR) 07/19/2014   Left lumbar pain 09/19/2020   Lung nodules 09/16/2017   Formatting of this note might be different from the original. Following with [facility].  [place].Neg PET scan   Malaise and fatigue 03/15/2015   Last Assessment & Plan:  Formatting of this note might be different from the original. Relevant Hx: Course: Daily Update: Today's Plan:she has some days better than others for her and she admits that with her allergies and asthma at times she is more tired as well as with the OA she has that creates some difficulty  Electronically signed by: Krystal Clark, NP 04/04/15 1058   Melanoma (HCC) 2011   heel   Mixed hyperlipidemia 03/15/2015   Moderate  depressive disorder 08/01/2018   OSA (obstructive sleep apnea)    does not tolerate CPAP , pt. reports that its severe, but doesn't use it anymore, since weight loss.   Study done in [place], not sure where.    Osteoarthritis    Osteoporosis    PONV (postoperative nausea and vomiting)    Prediabetes 11/18/2019   Primary insomnia 03/15/2015   Primary osteoarthritis involving multiple joints 03/15/2015   Last Assessment & Plan:  Formatting of this note might be different from the original. 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   Restless leg syndrome 11/18/2017   Right kidney mass 05/31/2021   S/P knee replacement 01/29/2014   Seasonal allergic rhinitis due to pollen 03/17/2019   Sinus bradycardia 12/07/2019   Sinus bradycardia by electrocardiogram 12/07/2019   Sleep apnea    Upper airway cough syndrome 08/04/2012   Followed in Pulmonary clinic/ [facility]/ [place]  -[place] eval around 2012 pos dust/ roach  Neg resp to singulair  - PFT's 02/2010 nl in effort dep portion of f/v loop, lung vol and dlcos also nl  - CT chest 05/27/12 wnl x small HH - 08/04/12   Alpha One AT >  MM - informed 09/10/2012 had not received demerol rx on 09/02/12  - sinus ct 10/23/2012 >>> Clear paranasal sinuses. - responded to neuronti   Vitamin B 12 deficiency    Vitamin D deficiency     Allergies  Allergen Reactions   Iodinated Contrast Media Rash   Neurontin [Gabapentin] Other (See Comments)    Severe falls,dizziness, forgetful    Cefdinir Other (See Comments)   Ciprofloxacin Diarrhea   Codeine Nausea And Vomiting   Egg-Derived Products     GI upset per patient   Misc. Sulfonamide  Containing Compounds Other (See Comments)   Other Nausea And Vomiting    General anesthesia    Oxycodone Nausea And Vomiting   Tramadol     REACTION: GI discomfort with high doses   Cyclobenzaprine Palpitations   Sulfa Antibiotics Rash    ROS: Negative except as per HPI above  Objective:  General: AAO x3, NAD  Dermatological: With inspection and palpation of the right and left lower extremities there are no open sores, no preulcerative lesions, no rash or signs of infection present. Nails are of normal length thickness and coloration.   Vascular:  Dorsalis Pedis artery and Posterior Tibial artery  pedal pulses are 2/4 bilateral.  Capillary fill time < 3 sec to all digits.   Neruologic: Grossly intact via light touch bilateral with protective sensation intact however there is pins and needle sensation on bilateral foot especially on the plantar aspect.  Pain radiating from the lower back down along the legs into the foot there is a shooting pain subjectively  Musculoskeletal: Pain with palpation of the plantar medial tubercle of the calcaneus bilaterally  Gait: Unassisted, Nonantalgic.   No images are attached to the encounter.  Assessment:   1. Bilateral plantar fasciitis   2. Lumbar radiculopathy      Plan:  Patient was evaluated and treated and all questions answered.  # Bilateral plantar fasciitis -Recommend steroid injection however patient wishes to defer at this time -Rec icing stretching and inserts -Dispensed 1 pair of power step orthotics with the patient which I believe will help with her plantar heel pain  # Lumbar radiculopathy -Patient has upcoming steroid injection in her lower back which I believe will help with her neuropathic related pain -Do believe much of her pain in her feet is related to neuropathic pain from spinal origin versus true plantar fasciitis -Discussed use of gabapentin however patient wishes to defer  Return if symptoms worsen or fail to  improve.          Corinna Gab, DPM [facility] / [facility]

## 2022-08-21 ENCOUNTER — Telehealth: Payer: Self-pay | Admitting: Pulmonary Disease

## 2022-08-21 MED ORDER — FLUTICASONE FUROATE-VILANTEROL 100-25 MCG/ACT IN AEPB: 1.0000 | INHALATION_SPRAY | Freq: Every day | RESPIRATORY_TRACT | 3 refills | Status: DC

## 2022-08-21 NOTE — Telephone Encounter (Signed)
Pt calling to get a prescription for Breo  [facility] [address] [facility] [place]

## 2022-08-21 NOTE — Telephone Encounter (Signed)
Prescription has been sent and patient informed. Nothing further needed

## 2022-08-24 ENCOUNTER — Telehealth: Payer: Self-pay | Admitting: Pulmonary Disease

## 2022-08-24 MED ORDER — FLUTICASONE FUROATE-VILANTEROL 100-25 MCG/ACT IN AEPB: 1.0000 | INHALATION_SPRAY | Freq: Every day | RESPIRATORY_TRACT | 5 refills | Status: DC

## 2022-08-24 NOTE — Telephone Encounter (Signed)
Breo prescription has been sent to her pharmacy. Patient informed

## 2022-08-24 NOTE — Telephone Encounter (Signed)
Dr. Isaiah Serge Pt states her Pharmacy has not received the fluticasone furoate-vilanterol (BREO ELLIPTA) 100-25 MCG/ACT AEPB  [id number] [place] [place] - [address]

## 2022-10-08 ENCOUNTER — Ambulatory Visit: Payer: Medicare Other | Admitting: Pulmonary Disease

## 2022-10-08 ENCOUNTER — Encounter: Payer: Self-pay | Admitting: Pulmonary Disease

## 2022-10-08 VITALS — BP 120/68 | HR 65 | Ht 62.0 in | Wt 145.8 lb

## 2022-10-08 DIAGNOSIS — J4540 Moderate persistent asthma, uncomplicated: Secondary | ICD-10-CM

## 2022-10-08 DIAGNOSIS — G4733 Obstructive sleep apnea (adult) (pediatric): Secondary | ICD-10-CM

## 2022-10-08 DIAGNOSIS — R053 Chronic cough: Secondary | ICD-10-CM | POA: Diagnosis not present

## 2022-10-08 MED ORDER — FLUTICASONE-SALMETEROL 250-50 MCG/ACT IN AEPB: 1.0000 | INHALATION_SPRAY | Freq: Two times a day (BID) | RESPIRATORY_TRACT | 10 refills | Status: DC

## 2022-10-08 NOTE — Progress Notes (Signed)
[name]    [id number]    [birth year]  Primary Care Physician:Grisso, Evlyn Courier., MD  Referring Physician: Gordan Payment., MD [address] [place],  [place] [address]  Chief complaint: Follow-up for chronic cough  HPI: 77 year old with chronic cough likely secondary to LPR, GERD, hiatal hernia, postnasal drip.  Previously followed by Dr. Kriste Basque.  Was told she had asthma in the past and was tried on multiple inhalers which did not help  Complains of chronic cough, nonproductive in nature.  No associated dyspnea, sputum production, fevers, chills She has seen Dr. Lucie Leather who has put her on double antiacid therapy with PPI and H2 blocker, chlorpheniramine and Flonase for postnasal drip.  She does note some improvement in her cough.  She developed COVID-19 in July 2022 treated with Paxlovid with as an outpatient Recent high-resolution CT for follow-up of lung nodules showed stable lung nodules with no clear evidence of interstitial lung disease Per notes she has had extensive ENT evaluation without any abnormality noted  Pets: Dogs, outside cat and horse Occupation: Investment banker, corporate.  Works part-time Exposures: No mold, hot tub, Financial controller.  She does have down comforter but uses it only intermittently Smoking history: Never smoker Travel history: Originally from [place].  No significant travel history Relevant family history: Brother and mother had asthma.   Interim history: Discussed the use of AI scribe software for clinical note transcription with the patient, who gave verbal consent to proceed.  The patient, with a history of asthma and hiatal hernia, presents with a persistent cough that is not relieved by her current asthma medication, Breo. She reports that the cough is exhausting and interferes with her work. She has been taking the Breo irregularly due to cost concerns. She has also been experiencing post nasal drip, which has recently worsened.  In addition to her  asthma, the patient has a history of hiatal hernia for which she has undergone surgery twice. She is currently on Protonix and famotidine for acid reflux, which she believes may be contributing to her cough. She also reports leg cramps, which she attributes to the pantoprazole, and has subsequently reduced her dose.  The patient also mentions experiencing back and leg pain, but does not believe this is related to her cough.   Outpatient Encounter Medications as of 10/08/2022  Medication Sig   acetaminophen (TYLENOL) 500 MG tablet Take 500 mg by mouth every 6 (six) hours as needed for mild pain.    aspirin EC 81 MG tablet Take 81 mg by mouth daily as needed for mild pain (Chest Pain). Swallow whole.   Cholecalciferol (VITAMIN D3) 50 MCG (2000 UT) TABS Take 1 tablet by mouth daily.   dicyclomine (BENTYL) 10 MG capsule Take 10 mg by mouth every 6 (six) hours as needed for spasms (GI issues).   famotidine (PEPCID) 20 MG tablet Take 1 tablet (20 mg total) by mouth at bedtime.   fluticasone (FLONASE) 50 MCG/ACT nasal spray Place 2 sprays into both nostrils daily.   fluticasone furoate-vilanterol (BREO ELLIPTA) 100-25 MCG/ACT AEPB Inhale 1 puff into the lungs daily.   Hydroxychloroquine Sulfate 300 MG TABS Take 1 tablet by mouth daily.   levothyroxine (SYNTHROID, LEVOTHROID) 88 MCG tablet Take 88 mcg by mouth daily before breakfast.   LORazepam (ATIVAN) 0.5 MG tablet Take 0.5 mg by mouth at bedtime.   Melatonin 5 MG TABS Take 6 mg by mouth daily.   methocarbamol (ROBAXIN) 500 MG tablet  Take 500 mg by mouth daily as needed for muscle spasms.   metoprolol tartrate (LOPRESSOR) 25 MG tablet Take 0.5 tablets (12.5 mg total) by mouth 2 (two) times daily.   promethazine (PHENERGAN) 12.5 MG tablet Take 12.5 mg by mouth every 6 (six) hours as needed for nausea or vomiting.   promethazine-phenylephrine (PROMETHAZINE VC) 6.25-5 MG/5ML SYRP Take 5 mLs by mouth every 4 (four) hours as needed for congestion.    vitamin B-12 (CYANOCOBALAMIN) 250 MCG tablet Take 250 mcg by mouth daily.   pantoprazole (PROTONIX) 20 MG tablet Take 20 mg by mouth daily.   [DISCONTINUED] amoxicillin (AMOXIL) 500 MG capsule Take 500 mg by mouth 2 (two) times daily. Take twice daily for 7 days   [DISCONTINUED] famotidine (PEPCID) 40 MG tablet Take 1 tablet (40 mg total) by mouth every evening.   [DISCONTINUED] fluticasone furoate-vilanterol (BREO ELLIPTA) 100-25 MCG/ACT AEPB Inhale 1 puff into the lungs daily. TAKE 1 PUFF BY MOUTH EVERY DAY   [DISCONTINUED] meloxicam (MOBIC) 7.5 MG tablet Take 1 tablet (7.5 mg total) by mouth daily.   [DISCONTINUED] pantoprazole (PROTONIX) 40 MG tablet TAKE 1 TABLET BY MOUTH TWICE A DAY   [DISCONTINUED] predniSONE (DELTASONE) 20 MG tablet Take 500 mg by mouth daily with breakfast. Take 2 tablets by mouth every day for five days   No facility-administered encounter medications on file as of 10/08/2022.    Physical Exam: Blood pressure 136/76, pulse 71, temperature 97.6 F (36.4 C), temperature source Oral, height 5' 1.5" (1.562 m), weight 142 lb (64.4 kg), SpO2 98 %. Gen:      No acute distress HEENT:  EOMI, sclera anicteric Neck:     No masses; no thyromegaly Lungs:    Clear to auscultation bilaterally; normal respiratory effort CV:         Regular rate and rhythm; no murmurs Abd:      + bowel sounds; soft, non-tender; no palpable masses, no distension Ext:    No edema; adequate peripheral perfusion Skin:      Warm and dry; no rash Neuro: alert and oriented x 3 Psych: normal mood and affect   Data Reviewed: Imaging: High-resolution CT 08/19/20-no lung nodules, no significant interstitial lung disease. Subtle findings at lung base suggestive of atelectasis versus ground glass opacities/  High-resolution CT chest 08/07/2021-stable lung nodules no significant interstitial lung disease.  PFTs: 02/03/2021 FVC 2.46 [90%], FEV1 1.91 [93%], F/F 77, TLC 4.73 [96%], DLCO 20.46 [110%] No  obstruction but there is significant bronchodilator response and air trapping suggestive of airway disease.  Labs: RAST panel 03/12/2017- negative IgE 09/09/2020- 75 CBC 09/09/2020-WBC 6.3, eos 1.8%, absolute eosinophil count 113  Hypersensitivity pneumonitis 09/09/2020-negative  Assessment:  Follow-up for cough Moderate persistent asthma Likely has to ongoing LPR, GERD in the setting of hiatal hernia, postnasal drip She continues onPPI, H2 blocker and first-generation antihistamine, Flonase  PFTs reviewed with significant bronchodilator response and not trapping suggestive of small airways disease and asthma phenotype though she does not have peripheral eosinophils or IgE elevation.  Asthma is also supported by the fact that steroids make her cough better  Persistent cough despite intermittent use of Breo due to cost. Discussed alternative inhaler options and decided to switch to Vyxella 250/50 BID due to cost and similar mechanism of action.  -Switch from Breo to Wixella 250/50 BID. -Continue use of Albuterol rescue inhaler as needed, up to 2-3 times a day.  Gastroesophageal Reflux Disease (GERD) Persistent cough and history of hiatal hernia surgery. Currently on  Pantoprazole 20mg  daily and Famotidine 20mg  daily for acid reflux management.Follows with Dr. Chales Abrahams from gastroenterology  -Continue Pantoprazole 20mg  daily and Famotidine 20mg  daily.  Post Nasal Drip Recent onset, likely contributing to persistent cough.  -Start Flonase nasal spray. -Start over-the-counter Chlorpheniramine 4mg  three times a day.    Concern for ILD She has occasional exposure to down pillows We reviewed the CT scan on ILD conference on 10/07/2020.  Consensus was that there is no significant interstitial lung disease with very subtle findings at the base.  Possibility was raised of DIPNICH syndrome but not conclusive.    Follow up CT reviewed with no significant interstitial lung disease  OSA She has been  nontolerant with CPAP and been off therapy since 2015.  She has lost a lot of weight in the interim and does not want to retry CPAP  Plan/Recommendations: Change Breo to [employer] due to cost Continue PPI, H2 blockers Start Flonase, chlorphentermine for postnasal drip and chronic cough.  Chilton Greathouse MD [facility] 10/08/2022, 2:07 PM  CC: Gordan Payment., MD

## 2022-10-08 NOTE — Patient Instructions (Signed)
VISIT SUMMARY:  During your visit, we discussed your persistent cough, which is not relieved by your current asthma medication, Breo. You also mentioned experiencing post nasal drip and have a history of hiatal hernia and acid reflux. We also discussed your concerns about the cost of your asthma medication and the side effects of your acid reflux medication.  YOUR PLAN:  -ASTHMA: Asthma is a condition in which your airways narrow and swell and may produce extra mucus. This can make breathing difficult and trigger coughing. We decided to switch your asthma medication from Breo to Wixella 250/50 twice a day due to cost and similar mechanism of action. You should continue to use your Albuterol rescue inhaler as needed, up to 2-3 times a day.  -GASTROESOPHAGEAL REFLUX DISEASE (GERD): GERD is a chronic condition where stomach acid frequently flows back into the tube connecting your mouth and stomach (esophagus). This acid reflux can irritate the lining of your esophagus. You should continue taking Pantoprazole 20mg  daily and Famotidine 20mg  daily for acid reflux management.  -POST NASAL DRIP: Post nasal drip occurs when excess mucus builds up in the back of your throat. This can cause a cough. We decided to start you on Flonase nasal spray and over-the-counter Chlorpheniramine 4mg  three times a day to help manage this.  INSTRUCTIONS:  Please start taking Wixella 250/50 twice a day for your asthma and continue using your Albuterol rescue inhaler as needed. Continue taking Pantoprazole 20mg  daily and Famotidine 20mg  daily for your GERD. Start using Flonase nasal spray and over-the-counter Chlorpheniramine 4mg  three times a day for your post nasal drip. If your symptoms do not improve or worsen, please contact the office.

## 2022-10-17 ENCOUNTER — Telehealth: Payer: Self-pay | Admitting: Pulmonary Disease

## 2022-10-17 NOTE — Telephone Encounter (Signed)
Patient would like to know if Wixela is 250 mg. Patient phone number is [phone].

## 2022-10-18 NOTE — Telephone Encounter (Signed)
Called and spoke with patient.  Patient had questions about dosage of  Wixella 250/50 mcg compared to Breo and reason Breo is once/day and Wixella is twice/day.  All questions answered and advised patient to call with any issues with Wixella.  Nothing further at this time.

## 2022-11-01 ENCOUNTER — Other Ambulatory Visit: Payer: Self-pay | Admitting: Cardiology

## 2022-11-02 NOTE — Telephone Encounter (Signed)
Patient is calling to follow up on prescription due to being out of the medication and wanting to get it filled prior to the weekend.

## 2022-11-27 ENCOUNTER — Other Ambulatory Visit: Payer: Self-pay | Admitting: Cardiology

## 2022-11-27 NOTE — Telephone Encounter (Signed)
*  STAT* If patient is at the pharmacy, call can be transferred to refill team.   1. Which medications need to be refilled? (please list name of each medication and dose if known) Metoprolol   2. Would you like to learn more about the convenience, safety, & potential cost savings by using the [facility]?      3. Are you open to using the [facility] (Type [facility]. .   4. Which pharmacy/location (including street and city if local pharmacy) is [facility] to [facility] sent to? [facility] [address], [place]   5. Do they need a 30 day or 90 day supply? 30 days and refills- patient has appointment on 01-08-23

## 2022-12-13 ENCOUNTER — Other Ambulatory Visit: Payer: Self-pay

## 2022-12-13 MED ORDER — METOPROLOL TARTRATE 25 MG PO TABS: 12.5000 mg | ORAL_TABLET | Freq: Two times a day (BID) | ORAL | 0 refills | Status: DC

## 2022-12-20 NOTE — Progress Notes (Signed)
Cardiology Office Note:  .   Date:  12/21/2022  ID:  [name], DOB [birth year], MRN [id number] PCP: Gordan Payment., MD  [facility] Providers Cardiologist:  Gypsy Balsam, MD    History of Present Illness: .   [name] is a 77 y.o. female with a past medical history of CAD three-vessel coronary artery calcification noted on CT imaging in 2022, bradycardia, hypertension, SVT, cough variant asthma, laryngeal pharyngeal reflux, GERD, IBS, dyslipidemia, hypothyroidism.  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 Bing Matter on 10/24/2021, she was doing well from a cardiac perspective, advised she can follow-up in 1 year.  She presents today for preoperative evaluation.  She has been doing well from a cardiac perspective.  She has bothered by insomnia and subsequent fatigue for approximately 6 months.  She can only sleep for a few hours, then wakes up, finds her still very sleepy throughout the day. She denies chest pain, palpitations, dyspnea, pnd, orthopnea, n, v, dizziness, syncope, edema, weight gain, or early satiety.   ROS: Review of Systems  Constitutional:  Positive for malaise/fatigue (fatigue d/t insomnia).  Musculoskeletal:  Positive for back pain.  All other systems reviewed and are negative.    Studies Reviewed: Marland Kitchen   EKG Interpretation Date/Time:  Friday December 21 2022 09:47:03 EST Ventricular Rate:  60 PR Interval:  178 QRS Duration:  86 QT Interval:  424 QTC Calculation: 424 R Axis:   -12  Text Interpretation: Sinus rhythm with Premature atrial complexes in a pattern of bigeminy Low voltage QRS  Borderline ECG When compared with ECG of 08-Jul-2017 14:46, Premature atrial complexes are now Present Confirmed by Wallis Bamberg [phone]) on 12/21/2022 8:48:48 AM    Cardiac Studies & Procedures      ECHOCARDIOGRAM  ECHOCARDIOGRAM COMPLETE 01/24/2021  Narrative ECHOCARDIOGRAM REPORT    Patient Name:   [name] Date of Exam: 01/24/2021 Medical Rec #:  [id number]     Height:       62.5 in Accession #:    [id number]    Weight:       141.0 lb Date of Birth:  11-15-1945     BSA:          1.658 m Patient Age:    75 years      BP:           130/70 mmHg Patient Gender: F             HR:           70 bpm. Exam Location:  [facility]  Procedure: 2D Echo, Cardiac Doppler, Color Doppler and Strain Analysis  Indications:    Sinus bradycardia [R00.1 (ICD-10-CM)]; SVT (supraventricular tachycardia) (HCC) [I47.1 (ICD-10-CM)]; History of hiatal hernia [Z87.19 (ICD-10-CM)]  History:        Patient has no prior history of Echocardiogram examinations.  Sonographer:    Margreta Journey RDCS Referring Phys: [id number] ROBERT J KRASOWSKI  IMPRESSIONS   1. Left ventricular ejection fraction, by estimation, is 55 to 60%. The left ventricle has normal function. The left ventricle has no regional wall motion abnormalities. Left ventricular diastolic parameters were normal. 2. Right ventricular systolic function is normal. The right ventricular size is  normal. 3. The mitral valve is normal in structure. No evidence of mitral valve regurgitation. No evidence of mitral stenosis. 4. The aortic valve is normal in structure. Aortic valve regurgitation is not visualized. No aortic stenosis is present. 5. The inferior vena cava is normal in size with greater than 50% respiratory variability, suggesting right atrial pressure of 3 mmHg.  FINDINGS Left Ventricle: Left ventricular ejection fraction, by estimation, is 55 to 60%. The left ventricle has normal function. The left ventricle has no regional wall motion  abnormalities. The left ventricular internal cavity size was normal in size. There is no left ventricular hypertrophy. Left ventricular diastolic parameters were normal.  Right Ventricle: The right ventricular size is normal. No increase in right ventricular wall thickness. Right ventricular systolic function is normal.  Left Atrium: Left atrial size was normal in size.  Right Atrium: Right atrial size was normal in size.  Pericardium: There is no evidence of pericardial effusion.  Mitral Valve: The mitral valve is normal in structure. No evidence of mitral valve regurgitation. No evidence of mitral valve stenosis.  Tricuspid Valve: The tricuspid valve is normal in structure. Tricuspid valve regurgitation is not demonstrated. No evidence of tricuspid stenosis.  Aortic Valve: The aortic valve is normal in structure. Aortic valve regurgitation is not visualized. No aortic stenosis is present.  Pulmonic Valve: The pulmonic valve was normal in structure. Pulmonic valve regurgitation is not visualized. No evidence of pulmonic stenosis.  Aorta: The aortic root is normal in size and structure.  Venous: The inferior vena cava is normal in size with greater than 50% respiratory variability, suggesting right atrial pressure of 3 mmHg.  IAS/Shunts: No atrial level shunt detected by color flow Doppler.   LEFT VENTRICLE PLAX 2D LVIDd:         4.30 cm   Diastology LVIDs:         2.70 cm   LV e' medial:    7.29 cm/s LV PW:         0.90 cm   LV E/e' medial:  11.5 LV IVS:        1.00 cm   LV e' lateral:   9.46 cm/s LVOT diam:     1.90 cm   LV E/e' lateral: 8.8 LV SV:         68 LV SV Index:   41 LVOT Area:     2.84 cm   RIGHT VENTRICLE             IVC RV Basal diam:  2.30 cm     IVC diam: 1.80 cm RV S prime:     16.50 cm/s TAPSE (M-mode): 2.3 cm  LEFT ATRIUM             Index        RIGHT ATRIUM           Index LA diam:        2.80 cm 1.69 cm/m   RA Area:     11.40 cm LA Vol (A2C):    31.2 ml 18.85 ml/m  RA Volume:   22.10 ml  13.33 ml/m LA Vol (A4C):   16.2 ml 9.77 ml/m LA Biplane Vol: 23.5 ml 14.18 ml/m AORTIC VALVE LVOT Vmax:   126.00 cm/s LVOT Vmean:  80.500 cm/s LVOT VTI:    0.241 m  AORTA Ao Root diam: 3.10 cm Ao Asc diam:  2.80 cm  MITRAL VALVE MV Area (PHT): 3.03 cm    SHUNTS MV Decel Time:  250 msec    Systemic VTI:  0.24 m MV E velocity: 83.60 cm/s  Systemic Diam: 1.90 cm MV A velocity: 75.80 cm/s MV E/A ratio:  1.10  Belva Crome MD Electronically signed by Belva Crome MD Signature Date/Time: 01/24/2021/4:40:08 PM    Final   MONITORS  LONG TERM MONITOR (3-14 DAYS) 01/25/2021  Narrative Patch Wear Time:  6 days and 20 hours (2023-01-13T11:15:31-0500 to 2023-01-20T07:44:43-0500)  Patient had a min HR of 51 bpm, max HR of 203 bpm, and avg HR of 76 bpm. Predominant underlying rhythm was Sinus Rhythm. 372 Supraventricular Tachycardia runs occurred, the run with the fastest interval lasting 18 beats with a max rate of 203 bpm, the longest lasting 8 mins 1 sec with an avg rate of 154 bpm. Isolated SVEs were rare (<1.0%), SVE Couplets were rare (<1.0%), and SVE Triplets were rare (<1.0%). Isolated VEs were occasional (1.5%, 9838), and no VE Couplets or VE Triplets were present. Ventricular Bigeminy and Trigeminy were present.  Summary and conclusions: Multiple supraventricular tachycardias longest 8 minutes 1 seconds with average heart rate 154           Risk Assessment/Calculations:    CHA2DS2-VASc Score = 5   This indicates a 7.2% annual risk of stroke. The patient's score is based upon: CHF History: 0 HTN History: 1 Diabetes History: 0 Stroke History: 0 Vascular Disease History: 1 Age Score: 2 Gender Score: 1            Physical Exam:   VS:  BP 120/75 (BP Location: Right Arm, Patient Position: Sitting, Cuff Size: Normal)   Pulse 60   Ht 5\' 2"  (1.575 m)   Wt 143 lb (64.9 kg)   SpO2 95%   BMI 26.16 kg/m    Wt Readings  from Last 3 Encounters:  12/21/22 143 lb (64.9 kg)  10/08/22 145 lb 12.8 oz (66.1 kg)  10/24/21 142 lb (64.4 kg)    GEN: Well nourished, well developed in no acute distress NECK: No JVD; No carotid bruits CARDIAC: RRR, no murmurs, rubs, gallops RESPIRATORY:  Clear to auscultation without rales, wheezing or rhonchi  ABDOMEN: Soft, non-tender, non-distended EXTREMITIES:  No edema; No deformity   ASSESSMENT AND PLAN: .   CAD-triple-vessel calcifications noted on CT imaging in 2022; she is asymptomatic. Stable with no anginal symptoms. No indication for ischemic evaluation.  Continue aspirin 81 mg daily.  Continue metoprolol 12.5 mg twice daily. Heart healthy diet and regular cardiovascular exercise encouraged.    Hypothyroidism-will check thyroid panel.  Fatigue-likely multifactorial secondary to deconditioning because of her ongoing back pain, also she is dealing with insomnia.  Will check BMET, CBC, TSH for any contributory causes.  Preoperative cardiovascular evaluation- According to the Revised Cardiac Risk Index (RCRI), her Perioperative Risk of Major Cardiac Event is (%): 0.4 Her Functional Capacity in METs is: 4.3 according to the Duke Activity Status Index (DASI). Therefore, based on ACC/AHA guidelines, patient would be at acceptable risk for the planned procedure without further cardiovascular testing. I will route this recommendation to the requesting party via Epic fax function.  Regarding her aspirin therapy, she can hold this 7 days prior to her surgery and resumed once directed by the surgeon.       Dispo: Labs per above, follow-up in 1 year.  Signed, Flossie Dibble, NP

## 2022-12-21 ENCOUNTER — Encounter: Payer: Self-pay | Admitting: Cardiology

## 2022-12-21 ENCOUNTER — Ambulatory Visit: Payer: Medicare Other | Attending: Cardiology | Admitting: Cardiology

## 2022-12-21 VITALS — BP 120/75 | HR 60 | Ht 62.0 in | Wt 143.0 lb

## 2022-12-21 DIAGNOSIS — I10 Essential (primary) hypertension: Secondary | ICD-10-CM

## 2022-12-21 DIAGNOSIS — Z01810 Encounter for preprocedural cardiovascular examination: Secondary | ICD-10-CM | POA: Diagnosis not present

## 2022-12-21 DIAGNOSIS — I2510 Atherosclerotic heart disease of native coronary artery without angina pectoris: Secondary | ICD-10-CM | POA: Diagnosis present

## 2022-12-21 DIAGNOSIS — R5383 Other fatigue: Secondary | ICD-10-CM | POA: Diagnosis present

## 2022-12-21 DIAGNOSIS — R001 Bradycardia, unspecified: Secondary | ICD-10-CM

## 2022-12-21 DIAGNOSIS — E038 Other specified hypothyroidism: Secondary | ICD-10-CM

## 2022-12-21 MED ORDER — METOPROLOL TARTRATE 25 MG PO TABS: 25.0000 mg | ORAL_TABLET | Freq: Every day | ORAL | 2 refills | Status: DC

## 2022-12-21 NOTE — Patient Instructions (Signed)
Medication Instructions:  Your physician recommends that you continue on your current medications as directed. Please refer to the Current Medication list given to you today.  *If you need a refill on your cardiac medications before your next appointment, please call your pharmacy*   Lab Work: Your physician recommends that you have labs done in the office today. Your test included  basic metabolic panel, complete blood count, and a thyroid panel  If you have labs (blood work) drawn today and your tests are completely normal, you will receive your results only by: MyChart Message (if you have MyChart) OR A paper copy in the mail If you have any lab test that is abnormal or we need to change your treatment, we will call you to review the results.   Testing/Procedures: None Ordered   Follow-Up: At [facility], you and your health needs are our priority.  As part of our continuing mission to provide you with exceptional heart care, we have created designated Provider Care Teams.  These Care Teams include your primary Cardiologist (physician) and Advanced Practice Providers (APPs -  Physician Assistants and Nurse Practitioners) who all work together to provide you with the care you need, when you need it.  We recommend signing up for the patient portal called "MyChart".  Sign up information is provided on this After Visit Summary.  MyChart is used to connect with patients for Virtual Visits (Telemedicine).  Patients are able to view lab/test results, encounter notes, upcoming appointments, etc.  Non-urgent messages can be sent to your provider as well.   To learn more about what you can do with MyChart, go to [email or web address].    Your next appointment:   1 year follow up with Dr. Bing Matter

## 2022-12-22 LAB — CBC
Hematocrit: 41.5 % (ref 34.0–46.6)
Hemoglobin: 13.7 g/dL (ref 11.1–15.9)
MCH: 30.5 pg (ref 26.6–33.0)
MCHC: 33 g/dL (ref 31.5–35.7)
MCV: 92 fL (ref 79–97)
Platelets: 280 x10E3/uL (ref 150–450)
RBC: 4.49 x10E6/uL (ref 3.77–5.28)
RDW: 13.2 % (ref 11.7–15.4)
WBC: 8.4 x10E3/uL (ref 3.4–10.8)

## 2022-12-22 LAB — BASIC METABOLIC PANEL WITH GFR
BUN/Creatinine Ratio: 24 (ref 12–28)
BUN: 21 mg/dL (ref 8–27)
CO2: 21 mmol/L (ref 20–29)
Calcium: 9.4 mg/dL (ref 8.7–10.3)
Chloride: 107 mmol/L — ABNORMAL HIGH (ref 96–106)
Creatinine, Ser: 0.87 mg/dL (ref 0.57–1.00)
Glucose: 85 mg/dL (ref 70–99)
Potassium: 4.5 mmol/L (ref 3.5–5.2)
Sodium: 142 mmol/L (ref 134–144)
eGFR: 69 mL/min/1.73

## 2022-12-22 LAB — TSH: TSH: 3.03 u[IU]/mL (ref 0.450–4.500)

## 2022-12-22 LAB — T4, FREE: Free T4: 1.63 ng/dL (ref 0.82–1.77)

## 2022-12-22 LAB — T3, FREE: T3, Free: 2.1 pg/mL (ref 2.0–4.4)

## 2022-12-24 ENCOUNTER — Telehealth: Payer: Self-pay | Admitting: Cardiology

## 2022-12-24 ENCOUNTER — Telehealth: Payer: Self-pay | Admitting: Emergency Medicine

## 2022-12-24 NOTE — Telephone Encounter (Signed)
-----   Message from Flossie Dibble sent at 12/22/2022  5:58 PM EST ----- Kidney function is normal, electrolytes are stable.  BC is without anemia or infection.  Thyroid evaluation is normal.  No overt contributory causes for fatigue.

## 2022-12-24 NOTE — Telephone Encounter (Signed)
See other telephone encounter for 12/24/2022

## 2022-12-24 NOTE — Telephone Encounter (Signed)
Pt returning a call to the nurse for results

## 2022-12-24 NOTE — Telephone Encounter (Signed)
Results reviewed with pt as per Wallis Bamberg NP's note.  Pt verbalized understanding and had no additional questions. Routed to PCP.

## 2022-12-28 DIAGNOSIS — M48062 Spinal stenosis, lumbar region with neurogenic claudication: Secondary | ICD-10-CM | POA: Insufficient documentation

## 2022-12-29 ENCOUNTER — Ambulatory Visit (HOSPITAL_BASED_OUTPATIENT_CLINIC_OR_DEPARTMENT_OTHER)
Admission: EM | Admit: 2022-12-29 | Discharge: 2022-12-29 | Disposition: A | Discharge status: Home / Self Care | Payer: Medicare Other | Attending: Internal Medicine | Admitting: Internal Medicine

## 2022-12-29 ENCOUNTER — Encounter (HOSPITAL_BASED_OUTPATIENT_CLINIC_OR_DEPARTMENT_OTHER): Payer: Self-pay | Admitting: Emergency Medicine

## 2022-12-29 DIAGNOSIS — G8929 Other chronic pain: Secondary | ICD-10-CM | POA: Diagnosis not present

## 2022-12-29 DIAGNOSIS — M545 Low back pain, unspecified: Secondary | ICD-10-CM | POA: Diagnosis not present

## 2022-12-29 LAB — POCT URINALYSIS DIP (MANUAL ENTRY)
Bilirubin, UA: NEGATIVE
Blood, UA: NEGATIVE
Glucose, UA: NEGATIVE mg/dL
Ketones, POC UA: NEGATIVE mg/dL
Nitrite, UA: NEGATIVE
Protein Ur, POC: NEGATIVE mg/dL
Spec Grav, UA: 1.025
Urobilinogen, UA: 0.2 U/dL
pH, UA: 5.5

## 2022-12-29 NOTE — ED Provider Notes (Signed)
[facility]    CSN: [id number] Arrival date & time: 12/29/22  1113      History   Chief Complaint No chief complaint on file.   HPI [name] is a 77 y.o. female.   HPI Throbbing aching pain left flank for several days concerned it might be a flare of her diverticulitis.  At times pain travels around to abdomen.  Admits urinary frequency off-and-on for 2 weeks with occasional dysuria.  Denies cloudy or malodorous urine.  Admits occasional diarrhea last episode 2 days ago states has chronic diarrhea, denies bloody stool. Has a history of chronic low back pain currently wearing a back brace states scheduled for back surgery soon. Denies recent injury, lower extremity paresthesias or weakness. Past Medical History:  Diagnosis Date   Abnormal chest x-ray with multiple lung nodules 08/22/2017   Abnormal weight loss    Age-related osteoporosis without current pathological fracture 03/15/2015   Last Assessment & Plan:  Formatting of this note might be different from the original. Relevant Hx: Course: Daily Update: Today's Plan:discussed in depth with her and she is going to try the boniva when she gets back from her [place] trip, and she was advised to increase her vitamin D 3 to 3000 IU daily after review of her lab and we reviewed her DEXA last year and she will be due for this in 2018  El   Allergy    Alopecia    Anemia    Anxiety    antianxiety med. usually at night but can take during the day for anxiety   Arthralgia of hip 10/26/2019   Arthritis    both knees & hands  also the back & neck   Asthma    Atrial fibrillation (HCC)    pt. reports that [facility] that she had a "slight afib.", no further test needed (01/07/14 cardiology note does not mention any afib, just poor anterior r wave progression)   Breast pain, right 08/29/2021   Cataract    Cervical radiculopathy 01/19/2017   Chronic cough    Chronic rhinitis 12/04/2012   Followed in Pulmonary clinic/ [facility]/ [facility]   - sinus ct 10/23/2012 >>> Clear paranasal sinuses. - trial of qid 1st gen H1 02/12/2013 > not effective 05/28/13        Complication of anesthesia    Coronary artery disease involving native coronary artery of native heart without angina pectoris 06/08/2016   Formatting of this note might be different from the original. No longer sees cardiolgist.  ST okay.  Imaging with calcium   Cough variant asthma 06/18/2013   - Methacholine challenge test 06/03/13 > equivocal but clinically caused tightness and made her cough - hfa 75% p coaching 06/17/13 > Trial of dulera 100 2bid started 06/18/2013     Degenerative cervical disc 06/08/2016   Depression    Diaphragmatic hernia    Dizziness 12/11/2019   Elbow tendonitis    Endometriosis    Gastric polyp    Gastroenteritis    GERD (gastroesophageal reflux disease)    Hiatal hernia 11/24/2013   High risk medication use 03/15/2015   History of colonic polyps 02/09/2019   History of hiatal hernia    pt. has been told that the hiatal hernia has reappeared   History of melanoma 06/17/2017   Hypercholesteremia    Hyperlipidemia    Hypertension    pt. reports that she use to take antihtn med, but reports since weight loss she has been able to  come off.     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 Following with [facility].  [place].Neg PET scan   Malaise and fatigue 03/15/2015   Last Assessment & Plan:  Formatting of this note might be different from the original. 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 reports that its severe, but doesn't use it anymore, since weight loss.   Study done in [place], not sure where.    Osteoarthritis    Osteoporosis    PONV (postoperative nausea and vomiting)    Prediabetes 11/18/2019   Primary insomnia 03/15/2015   Primary osteoarthritis involving multiple joints 03/15/2015   Last Assessment & Plan:  Formatting of this note might be different from the original. 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   Restless leg syndrome 11/18/2017   Right kidney mass 05/31/2021   S/P knee replacement 01/29/2014   Seasonal allergic rhinitis due to pollen 03/17/2019   Sinus bradycardia 12/07/2019   Sinus bradycardia by electrocardiogram 12/07/2019   Sleep apnea    Upper airway cough syndrome 08/04/2012   Followed in Pulmonary clinic/ [facility]/ [facility]  -[place] eval around 2012 pos dust/ roach  Neg resp to singulair  - PFT's 02/2010 nl in effort dep portion of f/v loop, lung vol and dlcos also nl  - CT chest 05/27/12 wnl x small HH - 08/04/12   Alpha One AT >  MM - informed 09/10/2012 had not received demerol rx on 09/02/12  - sinus ct 10/23/2012 >>> Clear paranasal sinuses. - responded to neuronti   Vitamin B 12 deficiency    Vitamin D deficiency     Patient Active Problem List   Diagnosis Date Noted   Breast pain, right 08/29/2021   Caliectasis determined by ultrasound of kidney 06/14/2021   Right kidney mass 05/31/2021   Left lumbar pain  09/19/2020   Enlarged lymph nodes in armpit 05/09/2020   Vitamin B 12 deficiency    PONV (postoperative nausea and vomiting)    Osteoporosis    Osteoarthritis    OSA (obstructive sleep apnea)    Insomnia    Hypertension    Hyperlipidemia    Hypercholesteremia    History of hiatal hernia    Gastroenteritis    Gastric polyp    Endometriosis    Elbow tendonitis    Diaphragmatic hernia    Depression    Asthma    Chronic cough    SVT (supraventricular tachycardia) (HCC)    Arthritis    Anxiety    Alopecia  Abnormal weight loss    Dizziness 12/11/2019   Sinus bradycardia 12/07/2019   Sinus bradycardia by electrocardiogram 12/07/2019   Prediabetes 11/18/2019   Arthralgia of hip 10/26/2019   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   Seasonal allergic rhinitis due to pollen 03/17/2019   History of colonic polyps 02/09/2019   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   Restless leg syndrome 11/18/2017   Lung nodules 09/16/2017   Abnormal chest x-ray with multiple lung nodules 08/22/2017   History of melanoma 06/17/2017   Cervical radiculopathy 01/19/2017   Coronary artery disease involving native coronary artery of native heart without angina pectoris 06/08/2016   Degenerative cervical disc 06/08/2016   Age-related osteoporosis without current pathological fracture 03/15/2015   High risk medication use 03/15/2015   Hypothyroidism 03/15/2015   Malaise and fatigue 03/15/2015   Mixed hyperlipidemia 03/15/2015   Primary insomnia 03/15/2015   Primary osteoarthritis involving multiple joints 03/15/2015   Vitamin D deficiency 03/15/2015   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   Laryngopharyngeal reflux (LPR) 07/19/2014   S/P knee replacement 01/29/2014   GERD (gastroesophageal reflux disease) 11/24/2013   Hiatal hernia 11/24/2013   Cough variant asthma 06/18/2013   Chronic rhinitis 12/04/2012   Upper airway cough syndrome 08/04/2012   S/P Nissen  fundoplication (without gastrostomy tube) procedure 05/13/2012   Melanoma (HCC) 2011    Past Surgical History:  Procedure Laterality Date   ABDOMINAL HYSTERECTOMY     APPENDECTOMY     CHOLECYSTECTOMY     COLONOSCOPY  05/09/2015   Colonic Polyps, moderate sigmoid diverticulosis. Bx: Tubular Adenomas. Negative   DILATION AND CURETTAGE, DIAGNOSTIC / THERAPEUTIC     ESOPHAGEAL DILATION  08/2016   EYE SURGERY     cataracts removed / w IOL   GALLBLADDER SURGERY     HERNIA REPAIR     HIATAL HERNIA REPAIR  2/14   HIATAL HERNIA REPAIR  2014   revision of 2018 in [place]   KNEE ARTHROSCOPY     both knees for torn cartilage    NERVE REPAIR Left 07/11/2017   Procedure: Excision of saphenous nerve left knee. Right knee intra-articular injection.;  Surgeon: Durene Romans, MD;  Location: [facility];  Service: Orthopedics;  Laterality: Left;  60 mins   OOPHORECTOMY     TONSILLECTOMY     TOTAL KNEE ARTHROPLASTY Left 01/29/2014   Procedure: LEFT TOTAL KNEE ARTHROPLASTY MCL REPAIR;  Surgeon: Verlee Rossetti, MD;  Location: [facility] OR;  Service: Orthopedics;  Laterality: Left;   VESICOVAGINAL FISTULA CLOSURE W/ TAH      OB History   No obstetric history on file.      Home Medications    Prior to Admission medications   Medication Sig Start Date End Date Taking? Authorizing Provider  acetaminophen (TYLENOL) 500 MG tablet Take 500 mg by mouth every 6 (six) hours as needed for mild pain.     [provider]  albuterol (VENTOLIN HFA) 108 (90 Base) MCG/ACT inhaler Inhale 2 puffs into the lungs every 6 (six) hours as needed for shortness of breath or wheezing.    [provider]  aspirin EC 81 MG tablet Take 81 mg by mouth daily as needed for mild pain (Chest Pain). Swallow whole.    [provider]  baclofen (LIORESAL) 20 MG tablet Take 1 tablet by mouth 3 (three) times daily.    [provider]  Cholecalciferol (VITAMIN D3) 50 MCG (2000 UT) TABS Take 1 tablet by  mouth daily.  [provider]  famotidine (PEPCID) 20 MG tablet Take 1 tablet (20 mg total) by mouth at bedtime. 06/16/21   Lynann Bologna, MD  fluticasone (FLONASE) 50 MCG/ACT nasal spray Place 2 sprays into both nostrils daily. 07/25/20   [provider]  fluticasone-salmeterol (WIXELA INHUB) 250-50 MCG/ACT AEPB Inhale 1 puff into the lungs in the morning and at bedtime. 10/08/22   Mannam, Colbert Coyer, MD  Hydroxychloroquine Sulfate 300 MG TABS Take 1 tablet by mouth daily. 08/26/22   [provider]  ibuprofen (ADVIL) 200 MG tablet Take 200 mg by mouth daily as needed for mild pain (pain score 1-3) or moderate pain (pain score 4-6).    [provider]  levothyroxine (SYNTHROID, LEVOTHROID) 88 MCG tablet Take 88 mcg by mouth daily before breakfast.    [provider]  LORazepam (ATIVAN) 0.5 MG tablet Take 0.5 mg by mouth at bedtime.    [provider]  Melatonin 5 MG TABS Take 6 mg by mouth daily.    [provider]  metoprolol tartrate (LOPRESSOR) 25 MG tablet Take 1 tablet (25 mg total) by mouth daily. 12/21/22   Flossie Dibble, NP  pantoprazole (PROTONIX) 20 MG tablet Take 20 mg by mouth daily.    [provider]  promethazine (PHENERGAN) 12.5 MG tablet Take 12.5 mg by mouth every 6 (six) hours as needed for nausea or vomiting.    [provider]  vitamin B-12 (CYANOCOBALAMIN) 250 MCG tablet Take 250 mcg by mouth daily.    [provider]    Family History Family History  Problem Relation Age of Onset   Emphysema Father        smoked   Heart disease Father    Arthritis Father    Asthma Mother    Colon cancer Mother    Arthritis Mother    Asthma Brother    Arthritis Brother    Breast cancer Sister    Breast cancer Maternal Grandmother    Heart disease Maternal Grandmother    Heart disease Paternal Grandmother    Heart disease Paternal Aunt     Social History Social History   Tobacco Use    Smoking status: Never   Smokeless tobacco: Never  Vaping Use   Vaping status: Never Used  Substance Use Topics   Alcohol use: No    Comment: rare for holiday consumption only   Drug use: No     Allergies   Iodinated contrast media, Neurontin [gabapentin], Cefdinir, Ciprofloxacin, Codeine, Egg-derived products, Misc. sulfonamide containing compounds, Other, Oxycodone, Tramadol, Cyclobenzaprine, and Sulfa antibiotics   Review of Systems Review of Systems  Constitutional:  Negative for chills, fatigue and fever.  Gastrointestinal:  Positive for abdominal pain and diarrhea. Negative for blood in stool, constipation, nausea and vomiting.  Genitourinary:  Positive for dysuria and frequency.  Musculoskeletal:  Positive for back pain.     Physical Exam Triage Vital Signs ED Triage Vitals  Encounter Vitals Group     BP 12/29/22 1155 (!) 148/82     Systolic BP Percentile --      Diastolic BP Percentile --      Pulse Rate 12/29/22 1155 (!) 54     Resp 12/29/22 1155 18     Temp 12/29/22 1155 98.1 F (36.7 C)     Temp Source 12/29/22 1155 Oral     SpO2 12/29/22 1155 98 %     Weight --      Height --  Head Circumference --      Peak Flow --      Pain Score 12/29/22 1152 3     Pain Loc --      Pain Education --      Exclude from Growth Chart --    No data found.  Updated Vital Signs BP (!) 148/82 (BP Location: Right Arm)   Pulse (!) 54   Temp 98.1 F (36.7 C) (Oral)   Resp 18   SpO2 98%   Visual Acuity Right Eye Distance:   Left Eye Distance:   Bilateral Distance:    Right Eye Near:   Left Eye Near:    Bilateral Near:     Physical Exam Vitals and nursing note reviewed.  Constitutional:      Appearance: She is not ill-appearing.  HENT:     Head: Normocephalic.     Right Ear: Tympanic membrane and ear canal normal.     Left Ear: Tympanic membrane normal.     Nose: No rhinorrhea.     Mouth/Throat:     Mouth: Mucous membranes are moist.  Eyes:      Conjunctiva/sclera: Conjunctivae normal.  Cardiovascular:     Rate and Rhythm: Normal rate and regular rhythm.     Heart sounds: Normal heart sounds.  Pulmonary:     Effort: Pulmonary effort is normal.     Breath sounds: Normal breath sounds.  Abdominal:     Palpations: Abdomen is soft.     Tenderness: There is no abdominal tenderness. There is no right CVA tenderness, left CVA tenderness or guarding.  Musculoskeletal:     Cervical back: Neck supple.  Neurological:     Mental Status: She is alert.      UC Treatments / Results  Labs (all labs ordered are listed, but only abnormal results are displayed) Labs Reviewed  POCT URINALYSIS DIP (MANUAL ENTRY) - Abnormal; Notable for the following components:      Result Value   Leukocytes, UA Trace (*)    All other components within normal limits    EKG   Radiology No results found.  Procedures Procedures (including critical care time)  Medications Ordered in UC Medications - No data to display  Initial Impression / Assessment and Plan / UC Course  I have reviewed the triage vital signs and the nursing notes.  Pertinent labs & imaging results that were available during my care of the patient were reviewed by me and considered in my medical decision making (see chart for details).     77 year old female with vague left flank pain for several days concern for several days, concerned it may be her diverticulitis, she reports diarrhea which is not uncommon for her last episode yesterday, denies bloody stool or fever. She is afebrile, well-appearing does not have any abdominal or flank tenderness.  Point-of-care urine trace leukocytes otherwise normal no evidence of UTI.  Discussed with patient pain is of unclear etiology given her chronic conditions recommend she see her PCP, can start liquid diet if she is very concerned about the diverticulitis until she sees her doctor on Monday.  Recommend ED evaluation for fever, bloody stool,  worsening pain, concerns  Final Clinical Impressions(s) / UC Diagnoses   Final diagnoses:  Chronic left-sided low back pain without sciatica     Discharge Instructions      See your doctor on Monday for further evaluation.  Go to the emergency department for development of abdominal pain fever, bloody diarrhea, concerns  ED Prescriptions   None    PDMP not reviewed this encounter.   Meliton Rattan, [place] 12/29/22 1234

## 2022-12-29 NOTE — ED Triage Notes (Signed)
Pt c/o she has flare up of diverticulitis occasionally and thinks she might be having one again she has pain on her left side of abdomen also pt reports that she has been having occasional burning with urination, urinary frequency x 2 weeks.

## 2022-12-29 NOTE — Discharge Instructions (Signed)
See your doctor on Monday for further evaluation.  Go to the emergency department for development of abdominal pain fever, bloody diarrhea, concerns

## 2023-01-08 ENCOUNTER — Ambulatory Visit: Payer: Medicare Other | Admitting: Cardiology

## 2023-02-15 ENCOUNTER — Encounter: Payer: Self-pay | Admitting: Acute Care

## 2023-02-15 ENCOUNTER — Ambulatory Visit: Payer: Medicare Other | Admitting: Acute Care

## 2023-02-15 VITALS — BP 124/64 | HR 65

## 2023-02-15 DIAGNOSIS — R911 Solitary pulmonary nodule: Secondary | ICD-10-CM

## 2023-02-15 DIAGNOSIS — Z789 Other specified health status: Secondary | ICD-10-CM

## 2023-02-15 NOTE — Patient Instructions (Signed)
It is good to see you today. We will go ahead and do a PET scan as you are very anxious about this lung nodule.  You will get a call to get this scheduled. I will see you one week after to go over results and determine best plan of care.  You will get a call to get this scheduled.  Please contact office for sooner follow up if symptoms do not improve or worsen or seek emergency care  Please take OTC cough medication for your cough. Add Zyrtec daily for post nasal drip. Continue Flonase and  Wixela as you have been doing. Call if you need [place] sooner.  Please contact office for sooner follow up if symptoms do not improve or worsen or seek emergency care

## 2023-02-15 NOTE — Progress Notes (Signed)
History of Present Illness [name] is a 78 y.o. female never smoker with referred to Dr. Delton Coombes 02/2023 for evaluation of  incidental finding of pulmonary nodules on CT Chest 12/2022.    02/15/2023 [name] is a 78 year old female with a history of melanoma and chronic cough who presents with pulmonary nodules and worsening cough.She states the CT Chest was done to better assess her cough, and the lung nodules were an incidental finding. She is profoundly anxious about the finding.  She has pulmonary nodules in the right middle lobe and right lower lobe, with the left lung base nodule measuring 8.2 millimeters. She is anxious about these nodules, especially given her family history of cancer, and this anxiety is affecting her sleep.  Her chronic cough has persisted for about ten years and has worsened recently. It is more congested and associated with sinus infections. She experiences wheezing and uses Flonase for sinus drainage. She denies any smoking history. She also reports fatigue and difficulty sleeping.  She has a history of asthma , and uses Wixela as an inhaler. Pulmonary function tests in 2023 showed asthmatic type air trapping.  Her past medical history includes two episodes of melanoma on her heel, two hiatal hernia surgeries due to acid reflux, and a history of blood clots prior to a hysterectomy. She also has a history of thyroid issues for which she takes medication, and her levels are checked every six months.  Family history is significant for cancer, with her sister and grandmothers having had breast cancer, and her mother having had colon and rectal cancer. There is uncertainty about her grandfather's cancer type, possibly stomach or lung.  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 She has two adopted children aged 23 and 78.  Test Results:   CT Chest 12/30/2022>> [facility] Cardiovascular: The heart is normal in size. There is no pericardial effusion.  Aorta: The aorta is normal in caliber. No aortic aneurysm is identified. There is no significant atherosclerotic plaque. There is conventional anatomy of the great vessels.  Mediastinum: There is no mediastinal, hilar, or axillary lymphadenopathy.  Lungs: There is a 4.3 mm nodule in the lingula measured on image 56. There is right middle lobe pulmonary nodule measuring 7.2 mm on axial image 69. There is a 8.2 mm pulmonary nodule at the left lung base measured on axial image 92. Several additional smaller sub 5 mm pulmonary nodules are scattered bilaterally. There are scattered ground-glass opacities bilaterally.  Pleura: No pleural effusions or pneumothorax is present.  Chest Wall: Unremarkable.  Upper Abdomen: The partially imaged upper abdominal organs are unremarkable.  Osseous: Unremarkable.  IMPRESSION:  Scattered pulmonary nodules bilaterally measuring up to 8.2 mm. Short-term follow-up CT in 6 months is recommended.      Latest Ref Rng & Units 12/21/2022    9:50 AM 09/09/2020    2:41 PM 07/08/2017    2:34 PM  CBC  WBC 3.4 - 10.8 x10E3/uL 8.4  6.3  5.7   Hemoglobin 11.1 - 15.9 g/dL 29.5  62.1  30.8   Hematocrit 34.0 - 46.6 % 41.5  40.8  41.6   Platelets 150 - 450 x10E3/uL 280  260.0  319        Latest Ref Rng & Units  12/21/2022    9:50 AM 07/08/2017    2:34 PM 05/08/2016    9:31 AM  BMP  Glucose 70 - 99 mg/dL 85  88  81   BUN 8 - 27 mg/dL 21  14  14    Creatinine 0.57 - 1.00 mg/dL 7.82  9.56  2.13   BUN/Creat Ratio 12 - 28 24     Sodium 134 - 144 mmol/L 142  142  142   Potassium 3.5 - 5.2 mmol/L 4.5  4.5  4.0   Chloride 96 - 106 mmol/L 107  110  109   CO2 20 - 29 mmol/L 21  26  25    Calcium 8.7 - 10.3 mg/dL 9.4  9.2  9.6     BNP No results found for:  "BNP"  ProBNP No results found for: "PROBNP"  PFT    Component Value Date/Time   FEV1PRE 1.31 02/03/2021 1136   FEV1POST 1.91 02/03/2021 1136   FVCPRE 1.80 02/03/2021 1136   FVCPOST 2.46 02/03/2021 1136   TLC 4.73 02/03/2021 1136   DLCOUNC 20.46 02/03/2021 1136   PREFEV1FVCRT 72 02/03/2021 1136   PSTFEV1FVCRT 77 02/03/2021 1136    No results found.   Past medical hx Past Medical History:  Diagnosis Date   Abnormal chest x-ray with multiple lung nodules 08/22/2017   Abnormal weight loss    Age-related osteoporosis without current pathological fracture 03/15/2015   Last Assessment & Plan:  Formatting of this note might be different from the original. Relevant Hx: Course: Daily Update: Today's Plan:discussed in depth with her and she is going to try the boniva when she gets back from her [place] trip, and she was advised to increase her vitamin D 3 to 3000 IU daily after review of her lab and we reviewed her DEXA last year and she will be due for this in 2018  [facility]   Allergy    Alopecia    Anemia    Anxiety    antianxiety med. usually at night but can take during the day for anxiety   Arthralgia of hip 10/26/2019   Arthritis    both knees & hands  also the back & neck   Asthma    Atrial fibrillation (HCC)    pt. reports that [facility] that she had a "slight afib.", no further test needed (01/07/14 cardiology note does not mention any afib, just poor anterior r wave progression)   Breast pain, right 08/29/2021   Cataract    Cervical radiculopathy 01/19/2017   Chronic cough    Chronic rhinitis 12/04/2012   Followed in Pulmonary clinic/ [facility]/ [facility]   - sinus ct 10/23/2012 >>> Clear paranasal sinuses. - trial of qid 1st gen H1 02/12/2013 > not effective 05/28/13        Complication of anesthesia    Coronary artery disease involving native coronary artery of native heart without angina pectoris 06/08/2016   Formatting of this note might be different from the original. No  longer sees cardiolgist.  ST okay.  Imaging with calcium   Cough variant asthma 06/18/2013   - Methacholine challenge test 06/03/13 > equivocal but clinically caused tightness and made her cough - hfa 75% p coaching 06/17/13 > Trial of dulera 100 2bid started 06/18/2013     Degenerative cervical disc 06/08/2016   Depression    Diaphragmatic hernia    Dizziness 12/11/2019   Elbow tendonitis    Endometriosis    Gastric polyp    Gastroenteritis  GERD (gastroesophageal reflux disease)    Hiatal hernia 11/24/2013   High risk medication use 03/15/2015   History of colonic polyps 02/09/2019   History of hiatal hernia    pt. has been told that the hiatal hernia has reappeared   History of melanoma 06/17/2017   Hypercholesteremia    Hyperlipidemia    Hypertension    pt. reports that she use to take antihtn med, but reports since weight loss she has been able to come off.     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 Following with [facility].  Stabel.Neg PET scan   Malaise and fatigue 03/15/2015   Last Assessment & Plan:  Formatting of this note might be different from the original. 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 reports that its severe, but doesn't use it anymore, since weight loss.   Study done in [place], not sure where.    Osteoarthritis     Osteoporosis    PONV (postoperative nausea and vomiting)    Prediabetes 11/18/2019   Primary insomnia 03/15/2015   Primary osteoarthritis involving multiple joints 03/15/2015   Last Assessment & Plan:  Formatting of this note might be different from the original. 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   Restless leg syndrome 11/18/2017   Right kidney mass 05/31/2021   S/P knee replacement 01/29/2014   Seasonal allergic rhinitis due to pollen 03/17/2019   Sinus bradycardia 12/07/2019   Sinus bradycardia by electrocardiogram 12/07/2019   Sleep apnea    Upper airway cough syndrome 08/04/2012   Followed in Pulmonary clinic/ [facility]/ [facility]  -Kozlow eval around 2012 pos dust/ roach  Neg resp to singulair  - PFT's 02/2010 nl in effort dep portion of f/v loop, lung vol and dlcos also nl  - CT chest 05/27/12 wnl x small HH - 08/04/12   Alpha One AT >  MM - informed 09/10/2012 had not received demerol rx on 09/02/12  - sinus ct 10/23/2012 >>> Clear paranasal sinuses. - responded to neuronti   Vitamin B 12 deficiency    Vitamin D deficiency      Social History   Tobacco Use   Smoking status: Never   Smokeless tobacco: Never  Vaping Use   Vaping status: Never Used  Substance Use Topics   Alcohol use: No    Comment: rare for holiday consumption only   Drug use: No    Ms.Sacco reports that she has never smoked. She has never used smokeless tobacco. She reports that she does  not drink alcohol and does not use drugs.  Tobacco Cessation: Never smoker    Past surgical hx, Family hx, Social hx all reviewed.  Current Outpatient Medications on File Prior to Visit   Medication Sig   acetaminophen (TYLENOL) 500 MG tablet Take 500 mg by mouth every 6 (six) hours as needed for mild pain.    albuterol (VENTOLIN HFA) 108 (90 Base) MCG/ACT inhaler Inhale 2 puffs into the lungs every 6 (six) hours as needed for shortness of breath or wheezing.   aspirin EC 81 MG tablet Take 81 mg by mouth daily as needed for mild pain (Chest Pain). Swallow whole.   baclofen (LIORESAL) 20 MG tablet Take 1 tablet by mouth 3 (three) times daily.   Cholecalciferol (VITAMIN D3) 50 MCG (2000 UT) TABS Take 1 tablet by mouth daily.   famotidine (PEPCID) 20 MG tablet Take 1 tablet (20 mg total) by mouth at bedtime.   fluticasone (FLONASE) 50 MCG/ACT nasal spray Place 2 sprays into both nostrils daily.   fluticasone-salmeterol (WIXELA INHUB) 250-50 MCG/ACT AEPB Inhale 1 puff into the lungs in the morning and at bedtime.   Hydroxychloroquine Sulfate 300 MG TABS Take 1 tablet by mouth daily.   levothyroxine (SYNTHROID, LEVOTHROID) 88 MCG tablet Take 88 mcg by mouth daily before breakfast.   LORazepam (ATIVAN) 0.5 MG tablet Take 0.5 mg by mouth at bedtime.   Melatonin 5 MG TABS Take 6 mg by mouth daily.   metoprolol tartrate (LOPRESSOR) 25 MG tablet Take 1 tablet (25 mg total) by mouth daily.   pantoprazole (PROTONIX) 20 MG tablet Take 20 mg by mouth daily.   promethazine (PHENERGAN) 12.5 MG tablet Take 12.5 mg by mouth every 6 (six) hours as needed for nausea or vomiting.   promethazine-dextromethorphan (PROMETHAZINE-DM) 6.25-15 MG/5ML syrup Take 5 mLs by mouth every 6 (six) hours.   vitamin B-12 (CYANOCOBALAMIN) 250 MCG tablet Take 250 mcg by mouth daily.   ibuprofen (ADVIL) 200 MG tablet Take 200 mg by mouth daily as needed for mild pain (pain score 1-3) or moderate pain (pain score 4-6). (Patient not taking: Reported on 02/15/2023)   No current facility-administered medications on file prior to visit.     Allergies  Allergen Reactions   Iodinated Contrast Media Rash   Neurontin  [Gabapentin] Other (See Comments)    Severe falls,dizziness, forgetful    Cefdinir Other (See Comments)   Ciprofloxacin Diarrhea   Codeine Nausea And Vomiting   Egg-Derived Products     GI upset per patient   Misc. Sulfonamide Containing Compounds Other (See Comments)   Other Nausea And Vomiting    General anesthesia    Oxycodone Nausea And Vomiting   Tramadol     REACTION: GI discomfort with high doses   Cyclobenzaprine Palpitations   Sulfa Antibiotics Rash    Review Of Systems:  Constitutional:   No  weight loss, night sweats,  Fevers, chills, fatigue, or  lassitude.  HEENT:   No headaches,  Difficulty swallowing,  Tooth/dental problems, or  Sore throat,                No sneezing, itching, ear ache, nasal congestion, post nasal drip,   CV:  No chest pain,  Orthopnea, PND, swelling in lower extremities, anasarca, dizziness, palpitations, syncope.   GI  No heartburn, indigestion, abdominal pain, nausea, vomiting, diarrhea, change in bowel habits, loss of appetite, bloody stools.   Resp: + shortness of breath with exertion not at rest.  No excess mucus,  no productive cough,  No non-productive cough,  No coughing up of blood.  No change in color of mucus.  No wheezing.  No chest wall deformity  Skin: no rash or lesions.  GU: no dysuria, change in color of urine, no urgency or frequency.  No flank pain, no hematuria   MS:  No joint pain or swelling.  No decreased range of motion.  No back pain.  Psych:  No change in mood or affect. No depression , ++ anxiety.  No memory loss.   Vital Signs BP 124/64   Pulse 65   SpO2 97%    Physical Exam:  General- No distress,  A&Ox3, pleasant ENT: No sinus tenderness, TM clear, pale nasal mucosa, no oral exudate,no post nasal drip, no LAN Cardiac: S1, S2, regular rate and rhythm, no murmur Chest: No wheeze/ rales/ dullness; no accessory muscle use, no nasal flaring, no sternal retractions, slightly diminished per bases Abd.: Soft  Non-tender, ND, BS +. There is no height or weight on file to calculate BMI.  Ext: No clubbing cyanosis, edema Neuro:  normal strength, MAE x 4, A&O x 3 Skin: No rashes, warm and dry Psych: normal mood and behavior, very anxious   Assessment/Plan Incidental finding of a  4.3 mm nodule in the lingula,  right middle lobe pulmonary nodule measuring 7.2 mm , and 8.2 mm pulmonary nodule at the left lung base . Never smoker  No weight loss or hemoptysis Plan We will go ahead and do a PET scan as you are very anxious about this lung nodule.  You will get a call to get this scheduled. I will see you one week after to go over results and determine best plan of care.  You will get a call to get this scheduled.  Please contact office for sooner follow up if symptoms do not improve or worsen or seek emergency care  Please take OTC cough medication for your cough. Add Zyrtec daily for post nasal drip. Continue Flonase and  Wixela as you have been doing. Call if you need [place] sooner.  Please contact office for sooner follow up if symptoms do not improve or worsen or seek emergency care   I spent 30 minutes dedicated to the care of this patient on the date of this encounter to include pre-visit review of records, face-to-face time with the patient discussing conditions above, post visit ordering of testing, clinical documentation with the electronic health record, making appropriate referrals as documented, and communicating necessary information to the patient's healthcare team.   Bevelyn Ngo, NP 02/15/2023  6:45 PM

## 2023-02-28 ENCOUNTER — Encounter (HOSPITAL_COMMUNITY)
Admission: RE | Admit: 2023-02-28 | Discharge: 2023-02-28 | Disposition: A | Discharge status: Home / Self Care | Payer: Medicare Other | Source: Ambulatory Visit | Attending: Acute Care | Admitting: Acute Care

## 2023-02-28 DIAGNOSIS — R911 Solitary pulmonary nodule: Secondary | ICD-10-CM | POA: Diagnosis present

## 2023-02-28 LAB — GLUCOSE, CAPILLARY: Glucose-Capillary: 94 mg/dL (ref 70–99)

## 2023-02-28 MED ORDER — FLUDEOXYGLUCOSE F - 18 (FDG) INJECTION
7.1500 | Freq: Once | INTRAVENOUS | Status: AC
  Administered 2023-02-28: 7.21 via INTRAVENOUS

## 2023-03-07 ENCOUNTER — Telehealth: Payer: Self-pay | Admitting: Acute Care

## 2023-03-07 NOTE — Telephone Encounter (Signed)
 Spoke with patient. Advised results are still pending. Advised results will be reviewed at 03/22/2023 f/u appt with Alexandria Lodge, NP. Pt request sooner appt if available. No sooner openings at this time. Advised staff will call her if something becomes available.

## 2023-03-07 NOTE — Telephone Encounter (Signed)
 Patient would like to know the results of her PET scan. She can be reached at [phone]

## 2023-03-20 ENCOUNTER — Ambulatory Visit (INDEPENDENT_AMBULATORY_CARE_PROVIDER_SITE_OTHER): Admitting: Acute Care

## 2023-03-20 ENCOUNTER — Encounter: Payer: Self-pay | Admitting: Emergency Medicine

## 2023-03-20 ENCOUNTER — Encounter: Payer: Self-pay | Admitting: Acute Care

## 2023-03-20 VITALS — BP 145/78 | HR 57 | Ht 62.0 in | Wt 146.8 lb

## 2023-03-20 DIAGNOSIS — R911 Solitary pulmonary nodule: Secondary | ICD-10-CM

## 2023-03-20 DIAGNOSIS — R918 Other nonspecific abnormal finding of lung field: Secondary | ICD-10-CM | POA: Diagnosis not present

## 2023-03-20 NOTE — H&P (View-Only) (Signed)
 History of Present Illness [name] is a 78 y.o. female never smoker with referred to Dr. Delton Coombes 02/2023 for evaluation of  incidental finding of pulmonary nodules on CT Chest 12/2022.   Synopsis [name] is a 77 year old female with a history of melanoma and chronic cough who presents with pulmonary nodules and worsening cough.She states the CT Chest was done to better assess her cough, and the lung nodules were an incidental finding. She is profoundly anxious about the finding.   She has pulmonary nodules in the right middle lobe and right lower lobe, with the left lung base nodule measuring 8.2 millimeters. She is anxious about these nodules, especially given her family history of cancer, and this anxiety is affecting her sleep.   Her chronic cough has persisted for about ten years and has worsened recently. It is more congested and associated with sinus infections. She experiences wheezing and uses Flonase for sinus drainage. She denies any smoking history. She also reports fatigue and difficulty sleeping.   She has a history of asthma , and uses Wixela as an inhaler. Pulmonary function tests in 2023 showed asthmatic type air trapping.   Her past medical history includes two episodes of melanoma on her heel, two hiatal hernia surgeries due to acid reflux, and a history of blood clots prior to a hysterectomy. She also has a history of thyroid issues for which she takes medication, and her levels are checked every six months.   Family history is significant for cancer, with her sister and grandmothers having had breast cancer, and her mother having had colon and rectal cancer. There is uncertainty about her grandfather's cancer type, possibly stomach or lung.   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 She has two adopted children aged 69 and 36.   03/20/2023 Pt. Presents for follow up after PET scan. PET scan does show mild SUV of  2.7 in the Right middle lobe pulmonary nodule that now measures 9 mm ( 7.2 mm on 12/2022 CT Chest) . She is very anxious about this nodule and as it has low hypermetabolism, and has grown, we will move forward with bronch.  Pt. Does not take any blood thinners. She gets nauseated with anesthesia, so will need antiemetic before waking up. We have reviewed the risks and benefits of the procedure to include bleeding, infection, pneumothorax, and adverse reaction to anesthesia.  She verbally has consented to having the procedure done.  She will follow-up with me 1 week after bronchoscopy with biopsies to review cytology and make sure she has done well during the procedure.  She had no further questions at completion of the office visit.She will be scheduled for bronch and follow up.   Test Results:  PET scan 03/2023 Rounded nodule in the medial aspect of the RIGHT middle lobe measures 9 mm (image 84/4) and has associated metabolic activity with SUV max equal 2.7 on image 84. This small nodule touches the mediastinal pleural surface.   Similar size nodule is present on CT 08/07/2021 and 08/19/2020 and not changed in size.   No hypermetabolic mediastinal lymph nodes.   Uniform hypermetabolic activity associated with the thyroid gland.   Incidental CT findings: None.   ABDOMEN/PELVIS: No abnormal hypermetabolic activity within the liver, pancreas,  adrenal glands, or spleen. No hypermetabolic lymph nodes in the abdomen or pelvis.   Incidental CT findings: None   SKELETON: No focal hypermetabolic activity to suggest skeletal metastasis.   Incidental CT findings: None.   IMPRESSION: 1. The nodule of concern in the RIGHT middle lobe does have metabolic activity; however, the nodule is stable in size from 08/19/2020 which is  reassuring. Favor benign nodule.>> Last CT Chest 12/2022 shows RML of 7.2 mm, now 9 mm.  2. No evidence of metastatic disease on FDG PET scan.   CT Chest 12/30/2022>> [facility] Cardiovascular: The heart is normal in size. There is no pericardial effusion.  Aorta: The aorta is normal in caliber. No aortic aneurysm is identified. There is no significant atherosclerotic plaque. There is conventional anatomy of the great vessels.  Mediastinum: There is no mediastinal, hilar, or axillary lymphadenopathy.  Lungs: There is a 4.3 mm nodule in the lingula measured on image 56. There is right middle lobe pulmonary nodule measuring 7.2 mm on axial image 69. There is a 8.2 mm pulmonary nodule at the left lung base measured on axial image 92. Several additional smaller sub 5 mm pulmonary nodules are scattered bilaterally. There are scattered ground-glass opacities bilaterally.  Pleura: No pleural effusions or pneumothorax is present.  Chest Wall: Unremarkable.  Upper Abdomen: The partially imaged upper abdominal organs are unremarkable.  Osseous: Unremarkable.  IMPRESSION:  12/2022 CT Chest  Lungs: There is a 4.3 mm nodule in the lingula measured on image 56. There is right middle lobe pulmonary nodule measuring 7.2 mm on axial image 69. There is a 8.2 mm pulmonary nodule at the left lung base measured on axial image 92. Several additional smaller sub 5 mm pulmonary nodules are scattered bilaterally. There are scattered ground-glass opacities bilaterally.  Pleura: No pleural effusions or pneumothorax is present.  Chest Wall: Unremarkable.  Upper Abdomen: The partially imaged upper abdominal organs are unremarkable.  Osseous: Unremarkable.  IMPRESSION:  Scattered pulmonary nodules bilaterally measuring up to 8.2 mm. Short-term follow-up CT in 6 months is recommended.   Scattered pulmonary nodules bilaterally measuring up to 8.2 mm. Short-term follow-up CT in 6 months is  recommended.      Latest Ref Rng & Units 12/21/2022    9:50 AM 09/09/2020    2:41 PM 07/08/2017    2:34 PM  CBC  WBC 3.4 - 10.8 x10E3/uL 8.4  6.3  5.7   Hemoglobin 11.1 - 15.9 g/dL 40.9  81.1  91.4   Hematocrit 34.0 - 46.6 % 41.5  40.8  41.6   Platelets 150 - 450 x10E3/uL 280  260.0  319        Latest Ref Rng & Units 12/21/2022    9:50 AM 07/08/2017    2:34 PM 05/08/2016    9:31 AM  BMP  Glucose 70 - 99 mg/dL 85  88  81   BUN 8 - 27 mg/dL 21  14  14    Creatinine 0.57 - 1.00 mg/dL 7.82  9.56  2.13   BUN/Creat Ratio 12 - 28 24     Sodium 134 - 144 mmol/L 142  142  142   Potassium 3.5 - 5.2 mmol/L 4.5  4.5  4.0   Chloride 96 - 106 mmol/L 107  110  109   CO2 20 - 29 mmol/L 21  26  25    Calcium 8.7 - 10.3 mg/dL 9.4  9.2  9.6     BNP No results found for: "BNP"  ProBNP  No results found for: "PROBNP"  PFT    Component Value Date/Time   FEV1PRE 1.31 02/03/2021 1136   FEV1POST 1.91 02/03/2021 1136   FVCPRE 1.80 02/03/2021 1136   FVCPOST 2.46 02/03/2021 1136   TLC 4.73 02/03/2021 1136   DLCOUNC 20.46 02/03/2021 1136   PREFEV1FVCRT 72 02/03/2021 1136   PSTFEV1FVCRT 77 02/03/2021 1136    NM PET Image Initial (PI) Skull Base To Thigh Result Date: 03/18/2023 CLINICAL DATA:  Initial treatment strategy for pulmonary nodule. EXAM: NUCLEAR MEDICINE PET SKULL BASE TO THIGH TECHNIQUE: 7.2 mCi F-18 FDG was injected intravenously. Full-ring PET imaging was performed from the skull base to thigh after the radiotracer. CT data was obtained and used for attenuation correction and anatomic localization. Fasting blood glucose: 94 mg/dl COMPARISON:  CT chest 81/19/1478, 08/19/2020 FINDINGS: Choose one choose one NECK: No hypermetabolic lymph nodes in the neck. Incidental CT findings: None. CHEST: Rounded nodule in the medial aspect of the RIGHT middle lobe measures 9 mm (image 84/4) and has associated metabolic activity with SUV max equal 2.7 on image 84. This small nodule touches the mediastinal  pleural surface. Similar size nodule is present on CT 08/07/2021 and 08/19/2020 and not changed in size. No hypermetabolic mediastinal lymph nodes. Uniform hypermetabolic activity associated with the thyroid gland. Incidental CT findings: None. ABDOMEN/PELVIS: No abnormal hypermetabolic activity within the liver, pancreas, adrenal glands, or spleen. No hypermetabolic lymph nodes in the abdomen or pelvis. Incidental CT findings: None SKELETON: No focal hypermetabolic activity to suggest skeletal metastasis. Incidental CT findings: None. IMPRESSION: 1. The nodule of concern in the RIGHT middle lobe does have metabolic activity; however, the nodule is stable in size from 08/19/2020 which is reassuring. Favor benign nodule. 2. No evidence of metastatic disease on FDG PET scan. Electronically Signed   By: Genevive Bi M.D.   On: 03/18/2023 13:17     Past medical hx Past Medical History:  Diagnosis Date   Abnormal chest x-ray with multiple lung nodules 08/22/2017   Abnormal weight loss    Age-related osteoporosis without current pathological fracture 03/15/2015   Last Assessment & Plan:  Formatting of this note might be different from the original. Relevant Hx: Course: Daily Update: Today's Plan:discussed in depth with her and she is going to try the boniva when she gets back from her [place] trip, and she was advised to increase her vitamin D 3 to 3000 IU daily after review of her lab and we reviewed her DEXA last year and she will be due for this in 2018  El   Allergy    Alopecia    Anemia    Anxiety    antianxiety med. usually at night but can take during the day for anxiety   Arthralgia of hip 10/26/2019   Arthritis    both knees & hands  also the back & neck   Asthma    Atrial fibrillation (HCC)    pt. reports that [facility] that she had a "slight afib.", no further test needed (01/07/14 cardiology note does not mention any afib, just poor anterior r wave progression)   Breast pain, right  08/29/2021   Cataract    Cervical radiculopathy 01/19/2017   Chronic cough    Chronic rhinitis 12/04/2012   Followed in Pulmonary clinic/ [facility]/ [facility]   - sinus ct 10/23/2012 >>> Clear paranasal sinuses. - trial of qid 1st gen H1 02/12/2013 > not effective 05/28/13        Complication of anesthesia  Coronary artery disease involving native coronary artery of native heart without angina pectoris 06/08/2016   Formatting of this note might be different from the original. No longer sees cardiolgist.  ST okay.  Imaging with calcium   Cough variant asthma 06/18/2013   - Methacholine challenge test 06/03/13 > equivocal but clinically caused tightness and made her cough - hfa 75% p coaching 06/17/13 > Trial of dulera 100 2bid started 06/18/2013     Degenerative cervical disc 06/08/2016   Depression    Diaphragmatic hernia    Dizziness 12/11/2019   Elbow tendonitis    Endometriosis    Gastric polyp    Gastroenteritis    GERD (gastroesophageal reflux disease)    Hiatal hernia 11/24/2013   High risk medication use 03/15/2015   History of colonic polyps 02/09/2019   History of hiatal hernia    pt. has been told that the hiatal hernia has reappeared   History of melanoma 06/17/2017   Hypercholesteremia    Hyperlipidemia    Hypertension    pt. reports that she use to take antihtn med, but reports since weight loss she has been able to come off.     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 Following with [facility].  Stabel.Neg PET scan   Malaise and fatigue 03/15/2015   Last Assessment & Plan:  Formatting of this note might be different from the original. 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 reports that its severe, but doesn't use it anymore, since weight loss.   Study done in [place], not sure where.    Osteoarthritis    Osteoporosis    PONV (postoperative nausea and vomiting)    Prediabetes 11/18/2019   Primary insomnia 03/15/2015   Primary osteoarthritis involving multiple joints 03/15/2015   Last Assessment & Plan:  Formatting of this note might be different from the original. 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   Restless leg syndrome 11/18/2017   Right kidney mass 05/31/2021   S/P knee replacement 01/29/2014   Seasonal allergic rhinitis due to pollen 03/17/2019   Sinus bradycardia 12/07/2019   Sinus bradycardia by electrocardiogram 12/07/2019   Sleep apnea    Upper airway cough syndrome 08/04/2012   Followed in Pulmonary clinic/ [facility]/ [facility]  -Kozlow eval around 2012 pos dust/ roach  Neg resp to singulair  - PFT's 02/2010 nl in effort dep portion of f/v loop, lung vol and dlcos also nl  - CT chest 05/27/12 wnl x small HH - 08/04/12  Alpha One AT >  MM - informed 09/10/2012 had not received demerol rx on 09/02/12  - sinus ct 10/23/2012 >>> Clear paranasal sinuses. - responded to neuronti   Vitamin B 12 deficiency    Vitamin D deficiency      Social History   Tobacco Use   Smoking status:  Never    Passive exposure: Past   Smokeless tobacco: Never  Vaping Use   Vaping status: Never Used  Substance Use Topics   Alcohol use: No    Comment: rare for holiday consumption only   Drug use: No    [name] reports that she has never smoked. She has been exposed to tobacco smoke. She has never used smokeless tobacco. She reports that she does not drink alcohol and does not use drugs.  Tobacco Cessation: Counseling given: Not Answered Never smoker  Past surgical hx, Family hx, Social hx all reviewed.  Current Outpatient Medications on File Prior to Visit  Medication Sig   acetaminophen (TYLENOL) 500 MG tablet Take 500 mg by mouth every 6 (six) hours as needed for mild pain.    albuterol (VENTOLIN HFA) 108 (90 Base) MCG/ACT inhaler Inhale 2 puffs into the lungs every 6 (six) hours as needed for shortness of breath or wheezing.   aspirin EC 81 MG tablet Take 81 mg by mouth daily as needed for mild pain (Chest Pain). Swallow whole.   baclofen (LIORESAL) 20 MG tablet Take 1 tablet by mouth 3 (three) times daily.   Cholecalciferol (VITAMIN D3) 50 MCG (2000 UT) TABS Take 1 tablet by mouth daily.   famotidine (PEPCID) 20 MG tablet Take 1 tablet (20 mg total) by mouth at bedtime.   fluticasone (FLONASE) 50 MCG/ACT nasal spray Place 2 sprays into both nostrils daily.   fluticasone-salmeterol (WIXELA INHUB) 250-50 MCG/ACT AEPB Inhale 1 puff into the lungs in the morning and at bedtime.   Hydroxychloroquine Sulfate 300 MG TABS Take 1 tablet by mouth daily.   ibuprofen (ADVIL) 200 MG tablet Take 200 mg by mouth daily as needed for mild pain (pain score 1-3) or moderate pain (pain score 4-6).   levothyroxine (SYNTHROID, LEVOTHROID) 88 MCG tablet Take 88 mcg by mouth daily before breakfast.   LORazepam (ATIVAN) 0.5 MG tablet Take 0.5 mg by mouth at bedtime.   Melatonin 5 MG TABS Take 6 mg by mouth daily.   metoprolol tartrate (LOPRESSOR) 25 MG tablet Take 1 tablet (25 mg total) by mouth  daily.   pantoprazole (PROTONIX) 20 MG tablet Take 20 mg by mouth daily.   promethazine (PHENERGAN) 12.5 MG tablet Take 12.5 mg by mouth every 6 (six) hours as needed for nausea or vomiting.   promethazine-dextromethorphan (PROMETHAZINE-DM) 6.25-15 MG/5ML syrup Take 5 mLs by mouth every 6 (six) hours.   vitamin B-12 (CYANOCOBALAMIN) 250 MCG tablet Take 250 mcg by mouth daily.   No current facility-administered medications on file prior to visit.     Allergies  Allergen Reactions   Iodinated Contrast Media Rash   Neurontin [Gabapentin] Other (See Comments)    Severe falls,dizziness, forgetful    Cefdinir Other (See Comments)   Ciprofloxacin Diarrhea   Codeine Nausea And Vomiting   Egg-Derived Products     GI upset per patient   Misc. Sulfonamide Containing Compounds Other (See Comments)   Other Nausea And Vomiting    General anesthesia    Oxycodone Nausea And Vomiting   Tramadol     REACTION: GI discomfort with high doses   Cyclobenzaprine Palpitations  Sulfa Antibiotics Rash    Review Of Systems:  Constitutional:   No  weight loss, night sweats,  Fevers, chills, fatigue, or  lassitude.  HEENT:   No headaches,  Difficulty swallowing,  Tooth/dental problems, or  Sore throat,                No sneezing, itching, ear ache, nasal congestion, post nasal drip,   CV:  No chest pain,  Orthopnea, PND, swelling in lower extremities, anasarca, dizziness, palpitations, syncope.   GI  No heartburn, indigestion, abdominal pain, nausea, vomiting, diarrhea, change in bowel habits, loss of appetite, bloody stools.   Resp: No shortness of breath with exertion or at rest.  No excess mucus, no productive cough,  No non-productive cough,  No coughing up of blood.  No change in color of mucus.  No wheezing.  No chest wall deformity  Skin: no rash or lesions.  GU: no dysuria, change in color of urine, no urgency or frequency.  No flank pain, no hematuria   MS:  No joint pain or swelling.  No  decreased range of motion.  No back pain.  Psych:  No change in mood or affect. No depression or anxiety.  No memory loss.   Vital Signs BP (!) 145/78 (BP Location: Left Arm, Patient Position: Sitting, Cuff Size: Normal)   Pulse (!) 57   Ht 5\' 2"  (1.575 m)   Wt 146 lb 12.8 oz (66.6 kg)   SpO2 97%   BMI 26.85 kg/m    Physical Exam:  General- No distress,  A&Ox3, appropriate ENT: No sinus tenderness, TM clear, pale nasal mucosa, no oral exudate,no post nasal drip, no LAN Cardiac: S1, S2, regular rate and rhythm, no murmur Chest: No wheeze/ rales/ dullness; no accessory muscle use, no nasal flaring, no sternal retractions Abd.: Soft Non-tender, ND, BS +Body mass index is 26.85 kg/m.  Ext: No clubbing cyanosis, edema Neuro:  normal strength, MAE x 4, A&O x 3 Skin: No rashes, warm and dry, No lesions  Psych: normal mood and behavior   Assessment/Plan Growing pulmonary nodule with low grade SUV avidity on PET Never smoker  Plan There has been some growth in that right middle lobe nodule from 7.2 mm in December 2024 to 9 mm in March 2025. There is also low level hypermetabolism to the nodule. As you are anxious about this nodule and as there has been growth in a 66-month period plan will be for a bronchoscopy with biopsies. This will be done by Dr. Levy Pupa at [facility] I have ordered a super D CT of the chest to help plan for navigation to the lung nodule on procedure day. You will receive a letter today with all pertinent information regarding the procedure. You will need someone to drive you to the hospital that morning, stay with you, drive you home, and stay with you for 24 hours after the procedure. We discussed that the risks include bleeding, infection, puncture of the lung, and adverse reaction to anesthesia. You have agreed to move forward with the bronchoscopy and biopsies. Please remind anesthesia when you see them that you have nausea when you wake up so  that they can premedicate you to prevent that from happening. You will follow-up with me 1 week after the procedure to review the results and ensure you are doing well. Call us if you need [place] sooner Please contact office for sooner follow up if symptoms do not improve or worsen or seek  emergency care    BP was elevated in the office today. 145 systolic, then up to 150 on recheck.  It may be anxiety, but I have asked her to follow up with PCP or cardiology.    I spent 40 minutes dedicated to the care of this patient on the date of this encounter to include pre-visit review of records, face-to-face time with the patient discussing conditions above, post visit ordering of testing, clinical documentation with the electronic health record, making appropriate referrals as documented, and communicating necessary information to the patient's healthcare team.    Bevelyn Ngo, NP 03/20/2023  2:06 PM

## 2023-03-20 NOTE — Progress Notes (Addendum)
 History of Present Illness [name] is a 78 y.o. female never smoker with referred to Dr. Delton Coombes 02/2023 for evaluation of  incidental finding of pulmonary nodules on CT Chest 12/2022.   Synopsis [name] is a 77 year old female with a history of melanoma and chronic cough who presents with pulmonary nodules and worsening cough.She states the CT Chest was done to better assess her cough, and the lung nodules were an incidental finding. She is profoundly anxious about the finding.   She has pulmonary nodules in the right middle lobe and right lower lobe, with the left lung base nodule measuring 8.2 millimeters. She is anxious about these nodules, especially given her family history of cancer, and this anxiety is affecting her sleep.   Her chronic cough has persisted for about ten years and has worsened recently. It is more congested and associated with sinus infections. She experiences wheezing and uses Flonase for sinus drainage. She denies any smoking history. She also reports fatigue and difficulty sleeping.   She has a history of asthma , and uses Wixela as an inhaler. Pulmonary function tests in 2023 showed asthmatic type air trapping.   Her past medical history includes two episodes of melanoma on her heel, two hiatal hernia surgeries due to acid reflux, and a history of blood clots prior to a hysterectomy. She also has a history of thyroid issues for which she takes medication, and her levels are checked every six months.   Family history is significant for cancer, with her sister and grandmothers having had breast cancer, and her mother having had colon and rectal cancer. There is uncertainty about her grandfather's cancer type, possibly stomach or lung.   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 She has two adopted children aged 69 and 36.   03/20/2023 Pt. Presents for follow up after PET scan. PET scan does show mild SUV of  2.7 in the Right middle lobe pulmonary nodule that now measures 9 mm ( 7.2 mm on 12/2022 CT Chest) . She is very anxious about this nodule and as it has low hypermetabolism, and has grown, we will move forward with bronch.  Pt. Does not take any blood thinners. She gets nauseated with anesthesia, so will need antiemetic before waking up. We have reviewed the risks and benefits of the procedure to include bleeding, infection, pneumothorax, and adverse reaction to anesthesia.  She verbally has consented to having the procedure done.  She will follow-up with me 1 week after bronchoscopy with biopsies to review cytology and make sure she has done well during the procedure.  She had no further questions at completion of the office visit.She will be scheduled for bronch and follow up.   Test Results:  PET scan 03/2023 Rounded nodule in the medial aspect of the RIGHT middle lobe measures 9 mm (image 84/4) and has associated metabolic activity with SUV max equal 2.7 on image 84. This small nodule touches the mediastinal pleural surface.   Similar size nodule is present on CT 08/07/2021 and 08/19/2020 and not changed in size.   No hypermetabolic mediastinal lymph nodes.   Uniform hypermetabolic activity associated with the thyroid gland.   Incidental CT findings: None.   ABDOMEN/PELVIS: No abnormal hypermetabolic activity within the liver, pancreas,  adrenal glands, or spleen. No hypermetabolic lymph nodes in the abdomen or pelvis.   Incidental CT findings: None   SKELETON: No focal hypermetabolic activity to suggest skeletal metastasis.   Incidental CT findings: None.   IMPRESSION: 1. The nodule of concern in the RIGHT middle lobe does have metabolic activity; however, the nodule is stable in size from 08/19/2020 which is  reassuring. Favor benign nodule.>> Last CT Chest 12/2022 shows RML of 7.2 mm, now 9 mm.  2. No evidence of metastatic disease on FDG PET scan.   CT Chest 12/30/2022>> [facility] Cardiovascular: The heart is normal in size. There is no pericardial effusion.  Aorta: The aorta is normal in caliber. No aortic aneurysm is identified. There is no significant atherosclerotic plaque. There is conventional anatomy of the great vessels.  Mediastinum: There is no mediastinal, hilar, or axillary lymphadenopathy.  Lungs: There is a 4.3 mm nodule in the lingula measured on image 56. There is right middle lobe pulmonary nodule measuring 7.2 mm on axial image 69. There is a 8.2 mm pulmonary nodule at the left lung base measured on axial image 92. Several additional smaller sub 5 mm pulmonary nodules are scattered bilaterally. There are scattered ground-glass opacities bilaterally.  Pleura: No pleural effusions or pneumothorax is present.  Chest Wall: Unremarkable.  Upper Abdomen: The partially imaged upper abdominal organs are unremarkable.  Osseous: Unremarkable.  IMPRESSION:  12/2022 CT Chest  Lungs: There is a 4.3 mm nodule in the lingula measured on image 56. There is right middle lobe pulmonary nodule measuring 7.2 mm on axial image 69. There is a 8.2 mm pulmonary nodule at the left lung base measured on axial image 92. Several additional smaller sub 5 mm pulmonary nodules are scattered bilaterally. There are scattered ground-glass opacities bilaterally.  Pleura: No pleural effusions or pneumothorax is present.  Chest Wall: Unremarkable.  Upper Abdomen: The partially imaged upper abdominal organs are unremarkable.  Osseous: Unremarkable.  IMPRESSION:  Scattered pulmonary nodules bilaterally measuring up to 8.2 mm. Short-term follow-up CT in 6 months is recommended.   Scattered pulmonary nodules bilaterally measuring up to 8.2 mm. Short-term follow-up CT in 6 months is  recommended.      Latest Ref Rng & Units 12/21/2022    9:50 AM 09/09/2020    2:41 PM 07/08/2017    2:34 PM  CBC  WBC 3.4 - 10.8 x10E3/uL 8.4  6.3  5.7   Hemoglobin 11.1 - 15.9 g/dL 40.9  81.1  91.4   Hematocrit 34.0 - 46.6 % 41.5  40.8  41.6   Platelets 150 - 450 x10E3/uL 280  260.0  319        Latest Ref Rng & Units 12/21/2022    9:50 AM 07/08/2017    2:34 PM 05/08/2016    9:31 AM  BMP  Glucose 70 - 99 mg/dL 85  88  81   BUN 8 - 27 mg/dL 21  14  14    Creatinine 0.57 - 1.00 mg/dL 7.82  9.56  2.13   BUN/Creat Ratio 12 - 28 24     Sodium 134 - 144 mmol/L 142  142  142   Potassium 3.5 - 5.2 mmol/L 4.5  4.5  4.0   Chloride 96 - 106 mmol/L 107  110  109   CO2 20 - 29 mmol/L 21  26  25    Calcium 8.7 - 10.3 mg/dL 9.4  9.2  9.6     BNP No results found for: "BNP"  ProBNP  No results found for: "PROBNP"  PFT    Component Value Date/Time   FEV1PRE 1.31 02/03/2021 1136   FEV1POST 1.91 02/03/2021 1136   FVCPRE 1.80 02/03/2021 1136   FVCPOST 2.46 02/03/2021 1136   TLC 4.73 02/03/2021 1136   DLCOUNC 20.46 02/03/2021 1136   PREFEV1FVCRT 72 02/03/2021 1136   PSTFEV1FVCRT 77 02/03/2021 1136    NM PET Image Initial (PI) Skull Base To Thigh Result Date: 03/18/2023 CLINICAL DATA:  Initial treatment strategy for pulmonary nodule. EXAM: NUCLEAR MEDICINE PET SKULL BASE TO THIGH TECHNIQUE: 7.2 mCi F-18 FDG was injected intravenously. Full-ring PET imaging was performed from the skull base to thigh after the radiotracer. CT data was obtained and used for attenuation correction and anatomic localization. Fasting blood glucose: 94 mg/dl COMPARISON:  CT chest 81/19/1478, 08/19/2020 FINDINGS: Choose one choose one NECK: No hypermetabolic lymph nodes in the neck. Incidental CT findings: None. CHEST: Rounded nodule in the medial aspect of the RIGHT middle lobe measures 9 mm (image 84/4) and has associated metabolic activity with SUV max equal 2.7 on image 84. This small nodule touches the mediastinal  pleural surface. Similar size nodule is present on CT 08/07/2021 and 08/19/2020 and not changed in size. No hypermetabolic mediastinal lymph nodes. Uniform hypermetabolic activity associated with the thyroid gland. Incidental CT findings: None. ABDOMEN/PELVIS: No abnormal hypermetabolic activity within the liver, pancreas, adrenal glands, or spleen. No hypermetabolic lymph nodes in the abdomen or pelvis. Incidental CT findings: None SKELETON: No focal hypermetabolic activity to suggest skeletal metastasis. Incidental CT findings: None. IMPRESSION: 1. The nodule of concern in the RIGHT middle lobe does have metabolic activity; however, the nodule is stable in size from 08/19/2020 which is reassuring. Favor benign nodule. 2. No evidence of metastatic disease on FDG PET scan. Electronically Signed   By: Genevive Bi M.D.   On: 03/18/2023 13:17     Past medical hx Past Medical History:  Diagnosis Date   Abnormal chest x-ray with multiple lung nodules 08/22/2017   Abnormal weight loss    Age-related osteoporosis without current pathological fracture 03/15/2015   Last Assessment & Plan:  Formatting of this note might be different from the original. Relevant Hx: Course: Daily Update: Today's Plan:discussed in depth with her and she is going to try the boniva when she gets back from her [place] trip, and she was advised to increase her vitamin D 3 to 3000 IU daily after review of her lab and we reviewed her DEXA last year and she will be due for this in 2018  El   Allergy    Alopecia    Anemia    Anxiety    antianxiety med. usually at night but can take during the day for anxiety   Arthralgia of hip 10/26/2019   Arthritis    both knees & hands  also the back & neck   Asthma    Atrial fibrillation (HCC)    pt. reports that [facility] that she had a "slight afib.", no further test needed (01/07/14 cardiology note does not mention any afib, just poor anterior r wave progression)   Breast pain, right  08/29/2021   Cataract    Cervical radiculopathy 01/19/2017   Chronic cough    Chronic rhinitis 12/04/2012   Followed in Pulmonary clinic/ [facility]/ [facility]   - sinus ct 10/23/2012 >>> Clear paranasal sinuses. - trial of qid 1st gen H1 02/12/2013 > not effective 05/28/13        Complication of anesthesia  Coronary artery disease involving native coronary artery of native heart without angina pectoris 06/08/2016   Formatting of this note might be different from the original. No longer sees cardiolgist.  ST okay.  Imaging with calcium   Cough variant asthma 06/18/2013   - Methacholine challenge test 06/03/13 > equivocal but clinically caused tightness and made her cough - hfa 75% p coaching 06/17/13 > Trial of dulera 100 2bid started 06/18/2013     Degenerative cervical disc 06/08/2016   Depression    Diaphragmatic hernia    Dizziness 12/11/2019   Elbow tendonitis    Endometriosis    Gastric polyp    Gastroenteritis    GERD (gastroesophageal reflux disease)    Hiatal hernia 11/24/2013   High risk medication use 03/15/2015   History of colonic polyps 02/09/2019   History of hiatal hernia    pt. has been told that the hiatal hernia has reappeared   History of melanoma 06/17/2017   Hypercholesteremia    Hyperlipidemia    Hypertension    pt. reports that she use to take antihtn med, but reports since weight loss she has been able to come off.     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 Following with [facility].  Stabel.Neg PET scan   Malaise and fatigue 03/15/2015   Last Assessment & Plan:  Formatting of this note might be different from the original. 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 reports that its severe, but doesn't use it anymore, since weight loss.   Study done in [place], not sure where.    Osteoarthritis    Osteoporosis    PONV (postoperative nausea and vomiting)    Prediabetes 11/18/2019   Primary insomnia 03/15/2015   Primary osteoarthritis involving multiple joints 03/15/2015   Last Assessment & Plan:  Formatting of this note might be different from the original. 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   Restless leg syndrome 11/18/2017   Right kidney mass 05/31/2021   S/P knee replacement 01/29/2014   Seasonal allergic rhinitis due to pollen 03/17/2019   Sinus bradycardia 12/07/2019   Sinus bradycardia by electrocardiogram 12/07/2019   Sleep apnea    Upper airway cough syndrome 08/04/2012   Followed in Pulmonary clinic/ [facility]/ [facility]  -Kozlow eval around 2012 pos dust/ roach  Neg resp to singulair  - PFT's 02/2010 nl in effort dep portion of f/v loop, lung vol and dlcos also nl  - CT chest 05/27/12 wnl x small HH - 08/04/12  Alpha One AT >  MM - informed 09/10/2012 had not received demerol rx on 09/02/12  - sinus ct 10/23/2012 >>> Clear paranasal sinuses. - responded to neuronti   Vitamin B 12 deficiency    Vitamin D deficiency      Social History   Tobacco Use   Smoking status:  Never    Passive exposure: Past   Smokeless tobacco: Never  Vaping Use   Vaping status: Never Used  Substance Use Topics   Alcohol use: No    Comment: rare for holiday consumption only   Drug use: No    Ms.Knobloch reports that she has never smoked. She has been exposed to tobacco smoke. She has never used smokeless tobacco. She reports that she does not drink alcohol and does not use drugs.  Tobacco Cessation: Counseling given: Not Answered Never smoker  Past surgical hx, Family hx, Social hx all reviewed.  Current Outpatient Medications on File Prior to Visit  Medication Sig   acetaminophen (TYLENOL) 500 MG tablet Take 500 mg by mouth every 6 (six) hours as needed for mild pain.    albuterol (VENTOLIN HFA) 108 (90 Base) MCG/ACT inhaler Inhale 2 puffs into the lungs every 6 (six) hours as needed for shortness of breath or wheezing.   aspirin EC 81 MG tablet Take 81 mg by mouth daily as needed for mild pain (Chest Pain). Swallow whole.   baclofen (LIORESAL) 20 MG tablet Take 1 tablet by mouth 3 (three) times daily.   Cholecalciferol (VITAMIN D3) 50 MCG (2000 UT) TABS Take 1 tablet by mouth daily.   famotidine (PEPCID) 20 MG tablet Take 1 tablet (20 mg total) by mouth at bedtime.   fluticasone (FLONASE) 50 MCG/ACT nasal spray Place 2 sprays into both nostrils daily.   fluticasone-salmeterol (WIXELA INHUB) 250-50 MCG/ACT AEPB Inhale 1 puff into the lungs in the morning and at bedtime.   Hydroxychloroquine Sulfate 300 MG TABS Take 1 tablet by mouth daily.   ibuprofen (ADVIL) 200 MG tablet Take 200 mg by mouth daily as needed for mild pain (pain score 1-3) or moderate pain (pain score 4-6).   levothyroxine (SYNTHROID, LEVOTHROID) 88 MCG tablet Take 88 mcg by mouth daily before breakfast.   LORazepam (ATIVAN) 0.5 MG tablet Take 0.5 mg by mouth at bedtime.   Melatonin 5 MG TABS Take 6 mg by mouth daily.   metoprolol tartrate (LOPRESSOR) 25 MG tablet Take 1 tablet (25 mg total) by mouth  daily.   pantoprazole (PROTONIX) 20 MG tablet Take 20 mg by mouth daily.   promethazine (PHENERGAN) 12.5 MG tablet Take 12.5 mg by mouth every 6 (six) hours as needed for nausea or vomiting.   promethazine-dextromethorphan (PROMETHAZINE-DM) 6.25-15 MG/5ML syrup Take 5 mLs by mouth every 6 (six) hours.   vitamin B-12 (CYANOCOBALAMIN) 250 MCG tablet Take 250 mcg by mouth daily.   No current facility-administered medications on file prior to visit.     Allergies  Allergen Reactions   Iodinated Contrast Media Rash   Neurontin [Gabapentin] Other (See Comments)    Severe falls,dizziness, forgetful    Cefdinir Other (See Comments)   Ciprofloxacin Diarrhea   Codeine Nausea And Vomiting   Egg-Derived Products     GI upset per patient   Misc. Sulfonamide Containing Compounds Other (See Comments)   Other Nausea And Vomiting    General anesthesia    Oxycodone Nausea And Vomiting   Tramadol     REACTION: GI discomfort with high doses   Cyclobenzaprine Palpitations  Sulfa Antibiotics Rash    Review Of Systems:  Constitutional:   No  weight loss, night sweats,  Fevers, chills, fatigue, or  lassitude.  HEENT:   No headaches,  Difficulty swallowing,  Tooth/dental problems, or  Sore throat,                No sneezing, itching, ear ache, nasal congestion, post nasal drip,   CV:  No chest pain,  Orthopnea, PND, swelling in lower extremities, anasarca, dizziness, palpitations, syncope.   GI  No heartburn, indigestion, abdominal pain, nausea, vomiting, diarrhea, change in bowel habits, loss of appetite, bloody stools.   Resp: No shortness of breath with exertion or at rest.  No excess mucus, no productive cough,  No non-productive cough,  No coughing up of blood.  No change in color of mucus.  No wheezing.  No chest wall deformity  Skin: no rash or lesions.  GU: no dysuria, change in color of urine, no urgency or frequency.  No flank pain, no hematuria   MS:  No joint pain or swelling.  No  decreased range of motion.  No back pain.  Psych:  No change in mood or affect. No depression or anxiety.  No memory loss.   Vital Signs BP (!) 145/78 (BP Location: Left Arm, Patient Position: Sitting, Cuff Size: Normal)   Pulse (!) 57   Ht 5\' 2"  (1.575 m)   Wt 146 lb 12.8 oz (66.6 kg)   SpO2 97%   BMI 26.85 kg/m    Physical Exam:  General- No distress,  A&Ox3, appropriate ENT: No sinus tenderness, TM clear, pale nasal mucosa, no oral exudate,no post nasal drip, no LAN Cardiac: S1, S2, regular rate and rhythm, no murmur Chest: No wheeze/ rales/ dullness; no accessory muscle use, no nasal flaring, no sternal retractions Abd.: Soft Non-tender, ND, BS +Body mass index is 26.85 kg/m.  Ext: No clubbing cyanosis, edema Neuro:  normal strength, MAE x 4, A&O x 3 Skin: No rashes, warm and dry, No lesions  Psych: normal mood and behavior   Assessment/Plan Growing pulmonary nodule with low grade SUV avidity on PET Never smoker  Plan There has been some growth in that right middle lobe nodule from 7.2 mm in December 2024 to 9 mm in March 2025. There is also low level hypermetabolism to the nodule. As you are anxious about this nodule and as there has been growth in a 66-month period plan will be for a bronchoscopy with biopsies. This will be done by Dr. Levy Pupa at [facility] I have ordered a super D CT of the chest to help plan for navigation to the lung nodule on procedure day. You will receive a letter today with all pertinent information regarding the procedure. You will need someone to drive you to the hospital that morning, stay with you, drive you home, and stay with you for 24 hours after the procedure. We discussed that the risks include bleeding, infection, puncture of the lung, and adverse reaction to anesthesia. You have agreed to move forward with the bronchoscopy and biopsies. Please remind anesthesia when you see them that you have nausea when you wake up so  that they can premedicate you to prevent that from happening. You will follow-up with me 1 week after the procedure to review the results and ensure you are doing well. Call us if you need [place] sooner Please contact office for sooner follow up if symptoms do not improve or worsen or seek  emergency care    BP was elevated in the office today. 145 systolic, then up to 150 on recheck.  It may be anxiety, but I have asked her to follow up with PCP or cardiology.    I spent 40 minutes dedicated to the care of this patient on the date of this encounter to include pre-visit review of records, face-to-face time with the patient discussing conditions above, post visit ordering of testing, clinical documentation with the electronic health record, making appropriate referrals as documented, and communicating necessary information to the patient's healthcare team.    Bevelyn Ngo, NP 03/20/2023  2:06 PM

## 2023-03-20 NOTE — Patient Instructions (Signed)
 It is good to see you today. There has been some growth in that right middle lobe nodule from 7.2 mm in December 2020 24 to 9 mm in March 2025. There is also low level hypermetabolism to the nodule. As you are anxious about this nodule and as there has been growth in a 55-month period plan will be for a bronchoscopy with biopsies. This will be done by Dr. Levy Pupa at [facility] I have ordered a super D CT of the chest to help plan for navigation to the lung nodule on procedure day. You will receive a letter today with all pertinent information regarding the procedure. You will need someone to drive you to the hospital that morning, stay with you, drive you home, and stay with you for 24 hours after the procedure. We discussed that the risks include bleeding, infection, puncture of the lung, and adverse reaction to anesthesia. You have agreed to move forward with the bronchoscopy and biopsies. Please remind anesthesia when you see them that you have nausea when you wake up so that they can premedicate you to prevent that from happening. You will follow-up with me 1 week after the procedure to review the results and ensure you are doing well. Call us if you need [place] sooner Please contact office for sooner follow up if symptoms do not improve or worsen or seek emergency care

## 2023-03-21 ENCOUNTER — Encounter (HOSPITAL_COMMUNITY): Payer: Self-pay | Admitting: Emergency Medicine

## 2023-03-21 ENCOUNTER — Other Ambulatory Visit: Payer: Self-pay

## 2023-03-21 NOTE — Progress Notes (Signed)
 SDW CALL  Patient was given pre-op instructions over the phone. The opportunity was given for the patient to ask questions. No further questions asked. Patient verbalized understanding of instructions given.   PCP - Feliciana Rossetti Cardiologist - Dr. Gypsy Balsam  PPM/ICD - denies Device Orders - n/a Rep Notified - n/a  Chest x-ray -  EKG - 12/21/22 Stress Test - 2016 ECHO - 01/24/21 Cardiac Cath -   Sleep Study - OSA+ but does not wear CPAP   No DM  Last dose of GLP1 agonist-  n/a GLP1 instructions: n/a  Blood Thinner Instructions: n/a Aspirin Instructions: pt states that she takes Aspirin as needed for pain and is aware not to take it prior to her procedure   ERAS Protcol - NPO PRE-SURGERY Ensure or G2-  n/a  COVID TEST- no   Anesthesia review: yes - CAd, HTN, OSA  Patient denies shortness of breath, fever, cough and chest pain over the phone call   All instructions explained to the patient, with a verbal understanding of the material. Patient agrees to go over the instructions while at home for a better understanding.    Teach back method was used to review date of surgery, arrival time, and medications to take on the day of surgery.  Patient is aware that her husband will need to drive her home after the procedure and stay with her at all times for the first 24 hours.

## 2023-03-22 ENCOUNTER — Ambulatory Visit (HOSPITAL_BASED_OUTPATIENT_CLINIC_OR_DEPARTMENT_OTHER)
Admission: RE | Admit: 2023-03-22 | Discharge: 2023-03-22 | Disposition: A | Discharge status: Home / Self Care | Source: Ambulatory Visit | Attending: Acute Care | Admitting: Acute Care

## 2023-03-22 ENCOUNTER — Ambulatory Visit: Admitting: Acute Care

## 2023-03-22 DIAGNOSIS — R911 Solitary pulmonary nodule: Secondary | ICD-10-CM

## 2023-03-22 NOTE — Progress Notes (Addendum)
 Anesthesia Chart Review: Same day workup  78 year old female follows with pulmonology for history of pulmonary nodules, asthma, chronic cough, LPR, GERD (on PPI and H2 blocker), hiatal hernia, chronic postnasal drip.  Maintained on Wixela inhaler and as needed albuterol.  Recent PET scan showed mild SUV of 2.7 in 9 mm right middle lobe pulmonary nodule.  Bronchoscopy recommended.  PFTs 02/03/2021: FVC 2.46 [90%], FEV1 1.91 [93%], F/F 77, TLC 4.73 [96%], DLCO 20.46 [110%]   Follows cardiology for history of coronary calcification noted on CT, bradycardia, HTN, SVT.  Event monitor 02/05/2021 showed average heart rate was 76 bpm, predominant rhythm was sinus, 3 than 7 2 episodes of SVT--started on low-dose beta-blocker.echo 01/24/2021 showed EF 55 to 60%, no valvular abnormalities.  She was last seen by Wallis Bamberg, NP on 12/21/2022 for preop evaluation.  Per note, "According to the Revised Cardiac Risk Index (RCRI), her Perioperative Risk of Major Cardiac Event is (%): 0.4 Her Functional Capacity in METs is: 4.3 according to the Duke Activity Status Index (DASI). Therefore, based on ACC/AHA guidelines, patient would be at acceptable risk for the planned procedure without further cardiovascular testing. I will route this recommendation to the requesting party via Epic fax function.  Regarding her aspirin therapy, she can hold this 7 days prior to her surgery and resumed once directed by the surgeon."  Surgical clearance was initially for lumbar spine surgery that was ultimately postponed due to the discovery of enlarging pulmonary nodules.  Other pertinent history includes postoperative nausea and vomiting, hypothyroid.  Patient will need day of surgery labs and evaluation.  EKG 12/21/2022: Sinus rhythm with Premature atrial complexes in a pattern of bigeminy.  Rate 60. Low voltage QRS  PET scan 02/28/2023: IMPRESSION: 1. The nodule of concern in the RIGHT middle lobe does have metabolic activity;  however, the nodule is stable in size from 08/19/2020 which is reassuring. Favor benign nodule. 2. No evidence of metastatic disease on FDG PET scan.  CT chest 12/30/2022: IMPRESSION:  Scattered pulmonary nodules bilaterally measuring up to 8.2 mm. Short-term follow-up CT in 6 months is recommended.     Zannie Cove [facility] Short Stay Center/Anesthesiology Phone [phone] 03/22/2023 10:59 AM

## 2023-03-22 NOTE — Anesthesia Preprocedure Evaluation (Addendum)
 Anesthesia Evaluation  Patient identified by MRN, date of birth, ID band Patient awake    Reviewed: Allergy & Precautions, NPO status , Patient's Chart, lab work & pertinent test results  History of Anesthesia Complications (+) PONV and history of anesthetic complications  Airway Mallampati: II  TM Distance: >3 FB Neck ROM: Full    Dental no notable dental hx.    Pulmonary neg pulmonary ROS, sleep apnea    Pulmonary exam normal breath sounds clear to auscultation       Cardiovascular hypertension, Pt. on medications + CAD  Normal cardiovascular exam Rhythm:Regular Rate:Normal     Neuro/Psych   Anxiety Depression    negative neurological ROS  negative psych ROS   GI/Hepatic Neg liver ROS,GERD  ,,  Endo/Other  negative endocrine ROSHypothyroidism    Renal/GU negative Renal ROS  negative genitourinary   Musculoskeletal  (+) Arthritis , Osteoarthritis,    Abdominal   Peds negative pediatric ROS (+)  Hematology negative hematology ROS (+)   Anesthesia Other Findings   Reproductive/Obstetrics negative OB ROS                             Anesthesia Physical Anesthesia Plan  ASA: 3  Anesthesia Plan: General   Post-op Pain Management: Minimal or no pain anticipated   Induction: Intravenous  PONV Risk Score and Plan: 4 or greater and Ondansetron, Dexamethasone, Midazolam, Treatment may vary due to age or medical condition and Droperidol  Airway Management Planned: Oral ETT  Additional Equipment:   Intra-op Plan:   Post-operative Plan: Extubation in OR  Informed Consent: I have reviewed the patients History and Physical, chart, labs and discussed the procedure including the risks, benefits and alternatives for the proposed anesthesia with the patient or authorized representative who has indicated his/her understanding and acceptance.     Dental advisory given  Plan Discussed  with: CRNA  Anesthesia Plan Comments: (PAT note by Antionette Poles, PA-C: 78 year old female follows with pulmonology for history of pulmonary nodules, asthma, chronic cough, LPR, GERD (on PPI and H2 blocker), hiatal hernia, chronic postnasal drip.  Maintained on Wixela inhaler and as needed albuterol.  Recent PET scan showed mild SUV of 2.7 in 9 mm right middle lobe pulmonary nodule.  Bronchoscopy recommended.  PFTs 02/03/2021: FVC 2.46 [90%], FEV1 1.91 [93%], F/F 77, TLC 4.73 [96%], DLCO 20.46 [110%]   Follows cardiology for history of coronary calcification noted on CT, bradycardia, HTN, SVT.  Event monitor 02/05/2021 showed average heart rate was 76 bpm, predominant rhythm was sinus, 3 than 7 2 episodes of SVT--started on low-dose beta-blocker.echo 01/24/2021 showed EF 55 to 60%, no valvular abnormalities.  She was last seen by Wallis Bamberg, NP on 12/21/2022 for preop evaluation.  Per note, "According to the Revised Cardiac Risk Index (RCRI), her Perioperative Risk of Major Cardiac Event is (%): 0.4 Her Functional Capacity in METs is: 4.3 according to the Duke Activity Status Index (DASI). Therefore, based on ACC/AHA guidelines, patient would be at acceptable risk for the planned procedure without further cardiovascular testing. I will route this recommendation to the requesting party via Epic fax function.  Regarding her aspirin therapy, she can hold this 7 days prior to her surgery and resumed once directed by the surgeon."  Surgical clearance was initially for lumbar spine surgery that was ultimately postponed due to the discovery of enlarging pulmonary nodules.  Other pertinent history includes postoperative nausea and vomiting, hypothyroid.  Patient will  need day of surgery labs and evaluation.  EKG 12/21/2022: Sinus rhythm with Premature atrial complexes in a pattern of bigeminy.  Rate 60. Low voltage QRS  PET scan 02/28/2023: IMPRESSION: 1. The nodule of concern in the RIGHT middle lobe does  have metabolic activity; however, the nodule is stable in size from 08/19/2020 which is reassuring. Favor benign nodule. 2. No evidence of metastatic disease on FDG PET scan.  CT chest 12/30/2022: IMPRESSION:  Scattered pulmonary nodules bilaterally measuring up to 8.2 mm. Short-term follow-up CT in 6 months is recommended.   )        Anesthesia Quick Evaluation

## 2023-03-26 ENCOUNTER — Ambulatory Visit (HOSPITAL_COMMUNITY): Payer: Self-pay | Admitting: Physician Assistant

## 2023-03-26 ENCOUNTER — Ambulatory Visit (HOSPITAL_BASED_OUTPATIENT_CLINIC_OR_DEPARTMENT_OTHER): Payer: Self-pay | Admitting: Physician Assistant

## 2023-03-26 ENCOUNTER — Ambulatory Visit (HOSPITAL_COMMUNITY)

## 2023-03-26 ENCOUNTER — Ambulatory Visit (HOSPITAL_COMMUNITY)
Admission: RE | Admit: 2023-03-26 | Discharge: 2023-03-26 | Disposition: A | Discharge status: Home / Self Care | Source: Ambulatory Visit | Attending: Emergency Medicine | Admitting: Emergency Medicine

## 2023-03-26 ENCOUNTER — Encounter (HOSPITAL_COMMUNITY)
Admission: RE | Disposition: A | Discharge status: Home / Self Care | Payer: Self-pay | Source: Ambulatory Visit | Attending: Emergency Medicine

## 2023-03-26 ENCOUNTER — Other Ambulatory Visit: Payer: Self-pay

## 2023-03-26 ENCOUNTER — Encounter (HOSPITAL_COMMUNITY): Payer: Self-pay | Admitting: Emergency Medicine

## 2023-03-26 DIAGNOSIS — E039 Hypothyroidism, unspecified: Secondary | ICD-10-CM | POA: Diagnosis not present

## 2023-03-26 DIAGNOSIS — F32A Depression, unspecified: Secondary | ICD-10-CM | POA: Diagnosis not present

## 2023-03-26 DIAGNOSIS — I2510 Atherosclerotic heart disease of native coronary artery without angina pectoris: Secondary | ICD-10-CM

## 2023-03-26 DIAGNOSIS — Z79899 Other long term (current) drug therapy: Secondary | ICD-10-CM | POA: Insufficient documentation

## 2023-03-26 DIAGNOSIS — R911 Solitary pulmonary nodule: Secondary | ICD-10-CM | POA: Insufficient documentation

## 2023-03-26 DIAGNOSIS — K219 Gastro-esophageal reflux disease without esophagitis: Secondary | ICD-10-CM | POA: Diagnosis not present

## 2023-03-26 DIAGNOSIS — Z7722 Contact with and (suspected) exposure to environmental tobacco smoke (acute) (chronic): Secondary | ICD-10-CM | POA: Diagnosis not present

## 2023-03-26 DIAGNOSIS — D3A090 Benign carcinoid tumor of the bronchus and lung: Secondary | ICD-10-CM | POA: Diagnosis not present

## 2023-03-26 DIAGNOSIS — F419 Anxiety disorder, unspecified: Secondary | ICD-10-CM | POA: Diagnosis not present

## 2023-03-26 DIAGNOSIS — K449 Diaphragmatic hernia without obstruction or gangrene: Secondary | ICD-10-CM | POA: Insufficient documentation

## 2023-03-26 DIAGNOSIS — I10 Essential (primary) hypertension: Secondary | ICD-10-CM

## 2023-03-26 DIAGNOSIS — Z79890 Hormone replacement therapy (postmenopausal): Secondary | ICD-10-CM | POA: Diagnosis not present

## 2023-03-26 DIAGNOSIS — Z7951 Long term (current) use of inhaled steroids: Secondary | ICD-10-CM | POA: Insufficient documentation

## 2023-03-26 DIAGNOSIS — M1990 Unspecified osteoarthritis, unspecified site: Secondary | ICD-10-CM | POA: Insufficient documentation

## 2023-03-26 DIAGNOSIS — R918 Other nonspecific abnormal finding of lung field: Secondary | ICD-10-CM | POA: Diagnosis present

## 2023-03-26 DIAGNOSIS — Z7982 Long term (current) use of aspirin: Secondary | ICD-10-CM | POA: Insufficient documentation

## 2023-03-26 DIAGNOSIS — F418 Other specified anxiety disorders: Secondary | ICD-10-CM

## 2023-03-26 DIAGNOSIS — J45909 Unspecified asthma, uncomplicated: Secondary | ICD-10-CM | POA: Insufficient documentation

## 2023-03-26 DIAGNOSIS — G4733 Obstructive sleep apnea (adult) (pediatric): Secondary | ICD-10-CM | POA: Insufficient documentation

## 2023-03-26 HISTORY — PX: BRONCHIAL NEEDLE ASPIRATION BIOPSY: SHX5106

## 2023-03-26 HISTORY — DX: Personal history of pneumonia (recurrent): Z87.01

## 2023-03-26 HISTORY — PX: BRONCHIAL BIOPSY: SHX5109

## 2023-03-26 HISTORY — PX: VIDEO BRONCHOSCOPY WITH ENDOBRONCHIAL NAVIGATION: SHX6175

## 2023-03-26 HISTORY — PX: BRONCHIAL BRUSHINGS: SHX5108

## 2023-03-26 HISTORY — PX: FUDUCIAL PLACEMENT: SHX5083

## 2023-03-26 LAB — BASIC METABOLIC PANEL
Anion gap: 9 (ref 5–15)
BUN: 19 mg/dL (ref 8–23)
CO2: 26 mmol/L (ref 22–32)
Calcium: 9.7 mg/dL (ref 8.9–10.3)
Chloride: 109 mmol/L (ref 98–111)
Creatinine, Ser: 0.94 mg/dL (ref 0.44–1.00)
GFR, Estimated: >60 mL/min (ref >60)
Glucose, Bld: 101 mg/dL — ABNORMAL HIGH (ref 70–99)
Potassium: 4.4 mmol/L (ref 3.5–5.1)
Sodium: 144 mmol/L (ref 135–145)

## 2023-03-26 LAB — CBC
HCT: 42.2 % (ref 36.0–46.0)
Hemoglobin: 13.7 g/dL (ref 12.0–15.0)
MCH: 30.9 pg (ref 26.0–34.0)
MCHC: 32.5 g/dL (ref 30.0–36.0)
MCV: 95.3 fL (ref 80.0–100.0)
Platelets: 250 10*3/uL (ref 150–400)
RBC: 4.43 MIL/uL (ref 3.87–5.11)
RDW: 13.4 % (ref 11.5–15.5)
WBC: 6.7 10*3/uL (ref 4.0–10.5)
nRBC: 0 % (ref 0.0–0.2)

## 2023-03-26 SURGERY — VIDEO BRONCHOSCOPY WITH ENDOBRONCHIAL NAVIGATION
Anesthesia: General | Laterality: Right

## 2023-03-26 MED ORDER — FENTANYL CITRATE (PF) 100 MCG/2ML IJ SOLN
INTRAMUSCULAR | Status: AC
  Filled 2023-03-26: qty 2

## 2023-03-26 MED ORDER — PROPOFOL 500 MG/50ML IV EMUL
INTRAVENOUS | Status: DC | PRN
  Administered 2023-03-26: 150 ug/kg/min via INTRAVENOUS

## 2023-03-26 MED ORDER — SODIUM CHLORIDE 0.9 % IV SOLN: 12.5000 mg | INTRAVENOUS | Status: DC | PRN

## 2023-03-26 MED ORDER — PROPOFOL 10 MG/ML IV BOLUS
INTRAVENOUS | Status: DC | PRN
  Administered 2023-03-26: 130 mg via INTRAVENOUS

## 2023-03-26 MED ORDER — ROCURONIUM BROMIDE 10 MG/ML (PF) SYRINGE
PREFILLED_SYRINGE | INTRAVENOUS | Status: DC | PRN
  Administered 2023-03-26: 40 mg via INTRAVENOUS

## 2023-03-26 MED ORDER — LIDOCAINE 2% (20 MG/ML) 5 ML SYRINGE
INTRAMUSCULAR | Status: DC | PRN
Start: 2023-03-26 — End: 2023-03-26
  Administered 2023-03-26: 60 mg via INTRAVENOUS

## 2023-03-26 MED ORDER — DEXAMETHASONE SODIUM PHOSPHATE 10 MG/ML IJ SOLN
INTRAMUSCULAR | Status: DC | PRN
  Administered 2023-03-26: 10 mg via INTRAVENOUS

## 2023-03-26 MED ORDER — LACTATED RINGERS IV SOLN: INTRAVENOUS | Status: DC

## 2023-03-26 MED ORDER — ONDANSETRON HCL 4 MG/2ML IJ SOLN
INTRAMUSCULAR | Status: DC | PRN
  Administered 2023-03-26: 4 mg via INTRAVENOUS

## 2023-03-26 MED ORDER — AMISULPRIDE (ANTIEMETIC) 5 MG/2ML IV SOLN: 10.0000 mg | Freq: Once | INTRAVENOUS | Status: DC | PRN

## 2023-03-26 MED ORDER — SUCCINYLCHOLINE CHLORIDE 200 MG/10ML IV SOSY
PREFILLED_SYRINGE | INTRAVENOUS | Status: DC | PRN
  Administered 2023-03-26: 120 mg via INTRAVENOUS

## 2023-03-26 MED ORDER — CHLORHEXIDINE GLUCONATE 0.12 % MT SOLN
15.0000 mL | Freq: Once | OROMUCOSAL | Status: AC
  Administered 2023-03-26: 15 mL via OROMUCOSAL
  Filled 2023-03-26: qty 15

## 2023-03-26 MED ORDER — FENTANYL CITRATE (PF) 100 MCG/2ML IJ SOLN
INTRAMUSCULAR | Status: DC | PRN
  Administered 2023-03-26: 100 ug via INTRAVENOUS

## 2023-03-26 MED ORDER — HYDROMORPHONE HCL 1 MG/ML IJ SOLN: 0.2500 mg | INTRAMUSCULAR | Status: DC | PRN

## 2023-03-26 MED ORDER — SUGAMMADEX SODIUM 200 MG/2ML IV SOLN
INTRAVENOUS | Status: DC | PRN
  Administered 2023-03-26: 200 mg via INTRAVENOUS

## 2023-03-26 SURGICAL SUPPLY — 1 items: superlock fiducial marker IMPLANT

## 2023-03-26 NOTE — Interval H&P Note (Signed)
 History and Physical Interval Note:  03/26/2023 7:26 AM  [name]  has presented today for surgery, with the diagnosis of GROWING LUNG NODULE-RIGHT MIDDLE LOBE.  The various methods of treatment have been discussed with the patient and family. After consideration of risks, benefits and other options for treatment, the patient has consented to  Procedure(s) with comments: VIDEO BRONCHOSCOPY WITH ENDOBRONCHIAL NAVIGATION (Right) - WITH FLUORO WITH BIOPSY as a surgical intervention.  The patient's history has been reviewed, patient examined, no change in status, stable for surgery.  I have reviewed the patient's chart and labs.  Questions were answered to the patient's satisfaction.     [name]

## 2023-03-26 NOTE — Transfer of Care (Signed)
 Immediate Anesthesia Transfer of Care Note  Patient: [name]  Procedure(s) Performed: VIDEO BRONCHOSCOPY WITH ENDOBRONCHIAL NAVIGATION (Right) BRONCHOSCOPY, WITH BRUSH BIOPSY BRONCHOSCOPY, WITH NEEDLE ASPIRATION BIOPSY INSERTION, FIDUCIAL MARKER, GOLD BRONCHOSCOPY, WITH BIOPSY  Patient Location: PACU  Anesthesia Type:General  Level of Consciousness: drowsy  Airway & Oxygen Therapy: Patient Spontanous Breathing and Patient connected to nasal cannula oxygen  Post-op Assessment: Report given to RN and Post -op Vital signs reviewed and stable  Post vital signs: Reviewed and stable  Last Vitals:  Vitals Value Taken Time  BP 128/54 03/26/23 0831  Temp 36.3 C 03/26/23 0830  Pulse 64 03/26/23 0837  Resp 17 03/26/23 0837  SpO2 96 % 03/26/23 0837  Vitals shown include unfiled device data.  Last Pain:  Vitals:   03/26/23 0615  TempSrc:   PainSc: 6       Patients Stated Pain Goal: 1 (03/26/23 0615)  Complications: No notable events documented.

## 2023-03-26 NOTE — Op Note (Signed)
 Video Bronchoscopy with Robotic Assisted Bronchoscopic Navigation   Date of Operation: 03/26/2023   Pre-op Diagnosis: Right middle lobe nodule  Post-op Diagnosis: Same  Surgeon: Levy Pupa  Assistants: None  Anesthesia: General endotracheal anesthesia  Operation: Flexible video fiberoptic bronchoscopy with robotic assistance and biopsies.  Estimated Blood Loss: Minimal  Complications: None  Indications and History: [name] is a 78 y.o. female with history of melanoma, chronic cough.  She is a never smoker.  She has a small right middle lobe pulmonary nodule that he is increased slowly in size.  Recommendation made to achieve a tissue diagnosis via robotic assisted navigational bronchoscopy. The risks, benefits, complications, treatment options and expected outcomes were discussed with the patient.  The possibilities of pneumothorax, pneumonia, reaction to medication, pulmonary aspiration, perforation of a viscus, bleeding, failure to diagnose a condition and creating a complication requiring transfusion or operation were discussed with the patient who freely signed the consent.    Description of Procedure: The patient was seen in the Preoperative Area, was examined and was deemed appropriate to proceed.  The patient was taken to [facility] endoscopy room 3, identified as [name] and the procedure verified as Flexible Video Fiberoptic Bronchoscopy.  A Time Out was held and the above information confirmed.   Prior to the date of the procedure a high-resolution CT scan of the chest was performed. Utilizing ION software program a virtual tracheobronchial tree was generated to allow the creation of distinct navigation pathways to the patient's parenchymal abnormalities. After being taken to the operating room general anesthesia was initiated and the patient  was orally intubated. The video fiberoptic bronchoscope was introduced via the endotracheal tube and a general inspection was  performed which showed normal right and left lung anatomy. Aspiration of the bilateral mainstems was completed to remove any remaining secretions. Robotic catheter inserted into patient's endotracheal tube.   Target #1 right middle lobe nodule: The distinct navigation pathways prepared prior to this procedure were then utilized to navigate to patient's lesion identified on CT scan. The robotic catheter was secured into place and the vision probe was withdrawn.  Lesion location was approximated using fluoroscopy.  Local registration and targeting was performed using Cios three-dimensional imaging. Under fluoroscopic guidance transbronchial brushings, transbronchial needle biopsies, and transbronchial forceps biopsies were performed to be sent for cytology and pathology.  Under fluoroscopic guidance a single fiducial marker was placed adjacent to the nodule  At the end of the procedure a general airway inspection was performed and there was no evidence of active bleeding. The bronchoscope was removed.  The patient tolerated the procedure well. There was no significant blood loss and there were no obvious complications. A post-procedural chest x-ray is pending.  Samples Target #1: 1. Transbronchial brushings from right middle lobe nodule 2. Transbronchial Wang needle biopsies from right middle lobe nodule 3. Transbronchial forceps biopsies from right middle lobe nodule   Plans:  The patient will be discharged from the PACU to home when recovered from anesthesia and after chest x-ray is reviewed. We will review the cytology, pathology and microbiology results with the patient when they become available. Outpatient followup will be with Dr. Delton Coombes and Saralyn Pilar, NP.   Levy Pupa, MD, PhD 03/26/2023, 8:28 AM [facility] [phone] or if no answer before 7:00PM call [phone] For any issues after 7:00PM please call eLink [phone] .

## 2023-03-26 NOTE — Discharge Instructions (Addendum)
 Flexible Bronchoscopy, Care After This sheet gives you information about how to care for yourself after your test. Your doctor may also give you more specific instructions. If you have problems or questions, contact your doctor. Follow these instructions at home: Eating and drinking When your numbness is gone and your cough and gag reflexes have come back, you may: Eat only soft foods. Slowly drink liquids. When you get home after the test, go back to your normal diet. Driving Do not drive for 24 hours if you were given a medicine to help you relax (sedative). Do not drive or use heavy machinery while taking prescription pain medicine. General instructions  Take over-the-counter and prescription medicines only as told by your doctor. Return to your normal activities as told. Ask what activities are safe for you. Do not use any products that have nicotine or tobacco in them. This includes cigarettes and e-cigarettes. If you need help quitting, ask your doctor. Keep all follow-up visits as told by your doctor. This is important. It is very important if you had a tissue sample (biopsy) taken. Get help right away if: You have shortness of breath that gets worse. You get light-headed. You feel like you are going to pass out (faint). You have chest pain. You cough up: More than a little blood. More blood than before. Summary Do not eat or drink anything (not even water) for 2 hours after your test, or until your numbing medicine wears off. Do not use cigarettes. Do not use e-cigarettes. Get help right away if you have chest pain.  Please call our office for any questions or concerns.  [phone]. Okay to restart aspirin on 03/27/2023  This information is not intended to replace advice given to you by your health care provider. Make sure you discuss any questions you have with your health care provider. Document Released: 10/15/2008 Document Revised: 11/30/2016 Document Reviewed:  01/06/2016 Elsevier Patient Education  2020 [employer].

## 2023-03-26 NOTE — Anesthesia Procedure Notes (Signed)
 Procedure Name: Intubation Date/Time: 03/26/2023 7:38 AM  Performed by: Alease Medina, CRNAPre-anesthesia Checklist: Patient identified, Emergency Drugs available, Suction available and Patient being monitored Patient Re-evaluated:Patient Re-evaluated prior to induction Oxygen Delivery Method: Circle system utilized Preoxygenation: Pre-oxygenation with 100% oxygen Induction Type: IV induction and Rapid sequence Laryngoscope Size: Mac and 3 Grade View: Grade I Tube type: Oral Tube size: 8.5 mm Number of attempts: 1 Airway Equipment and Method: Stylet and Oral airway Placement Confirmation: ETT inserted through vocal cords under direct vision, positive ETCO2 and breath sounds checked- equal and bilateral Secured at: 24 cm Tube secured with: Tape Dental Injury: Teeth and Oropharynx as per pre-operative assessment

## 2023-03-26 NOTE — Anesthesia Postprocedure Evaluation (Signed)
 Anesthesia Post Note  Patient: [name]  Procedure(s) Performed: VIDEO BRONCHOSCOPY WITH ENDOBRONCHIAL NAVIGATION (Right) BRONCHOSCOPY, WITH BRUSH BIOPSY BRONCHOSCOPY, WITH NEEDLE ASPIRATION BIOPSY INSERTION, FIDUCIAL MARKER, GOLD BRONCHOSCOPY, WITH BIOPSY     Patient location during evaluation: PACU Anesthesia Type: General Level of consciousness: awake and alert Pain management: pain level controlled Vital Signs Assessment: post-procedure vital signs reviewed and stable Respiratory status: spontaneous breathing, nonlabored ventilation and respiratory function stable Cardiovascular status: blood pressure returned to baseline and stable Postop Assessment: no apparent nausea or vomiting Anesthetic complications: no   No notable events documented.  Last Vitals:  Vitals:   03/26/23 0845 03/26/23 0900  BP: (!) 104/44 113/62  Pulse: 71 68  Resp: 17 15  Temp:  (!) 36.3 C  SpO2: 98% 100%    Last Pain:  Vitals:   03/26/23 0900  TempSrc:   PainSc: 0-No pain                 Lowella Curb

## 2023-03-27 ENCOUNTER — Ambulatory Visit: Admitting: Pulmonary Disease

## 2023-03-27 ENCOUNTER — Ambulatory Visit: Payer: Self-pay | Admitting: Acute Care

## 2023-03-27 ENCOUNTER — Ambulatory Visit
Admission: RE | Admit: 2023-03-27 | Discharge: 2023-03-27 | Disposition: A | Discharge status: Home / Self Care | Source: Ambulatory Visit | Attending: Pulmonary Disease

## 2023-03-27 ENCOUNTER — Encounter: Payer: Self-pay | Admitting: Pulmonary Disease

## 2023-03-27 VITALS — BP 122/68 | HR 59 | Temp 98.3°F | Ht 62.5 in | Wt 147.4 lb

## 2023-03-27 DIAGNOSIS — R053 Chronic cough: Secondary | ICD-10-CM | POA: Diagnosis not present

## 2023-03-27 DIAGNOSIS — R918 Other nonspecific abnormal finding of lung field: Secondary | ICD-10-CM | POA: Diagnosis not present

## 2023-03-27 DIAGNOSIS — R911 Solitary pulmonary nodule: Secondary | ICD-10-CM

## 2023-03-27 DIAGNOSIS — R1010 Upper abdominal pain, unspecified: Secondary | ICD-10-CM | POA: Diagnosis not present

## 2023-03-27 DIAGNOSIS — J4540 Moderate persistent asthma, uncomplicated: Secondary | ICD-10-CM | POA: Diagnosis not present

## 2023-03-27 MED ORDER — PREDNISONE 10 MG PO TABS: 40.0000 mg | ORAL_TABLET | Freq: Every day | ORAL | 0 refills | Status: AC

## 2023-03-27 NOTE — Progress Notes (Signed)
 [name]    [id number]    12-Jul-1945  Primary Care Physician:Grisso, Evlyn Courier., MD  Referring Physician: Gordan Payment., MD [address] [place],  [place] [address]  Chief complaint: Follow-up for chronic cough, asthma, lung nodule  HPI: 78 y.o.  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  Previously followed by Dr. Kriste Basque.  Was told she had asthma in the past and was tried on multiple inhalers which did not help  Complains of chronic cough, nonproductive in nature.  No associated dyspnea, sputum production, fevers, chills She has seen Dr. Lucie Leather who has put her on double antiacid therapy with PPI and H2 blocker, chlorpheniramine and Flonase for postnasal drip.  She does note some improvement in her cough.  She developed COVID-19 in July 2022 treated with Paxlovid with as an outpatient Recent high-resolution CT for follow-up of lung nodules showed stable lung nodules with no clear evidence of interstitial lung disease Per notes she has had extensive ENT evaluation without any abnormality noted  Pets: Dogs, outside cat and horse Occupation: Investment banker, corporate.  Works part-time Exposures: No mold, hot tub, Financial controller.  She does have down comforter but uses it only intermittently Smoking history: Never smoker Travel history: Originally from [place].  No significant travel history Relevant family history: Brother and mother had asthma.   Interim history: Discussed the use of AI scribe software for clinical note transcription with the patient, who gave verbal consent to proceed.  History of Present Illness [name] is a 78 year old female with asthma and hiatal hernia who presents with abdominal pain and cough following a bronchoscopy.  She underwent a bronchoscopy yesterday and is experiencing new symptoms following the procedure. She has a sore throat and a sore in her mouth, which have improved. However, she began experiencing a  burning pain in her abdomen last night before bed, which worsened overnight. The pain initially started as a 'circle of pain' and she suspects it may be related to the biopsy performed on the right lower side. The pain has since spread from under her breast down to her abdomen and around to her back, particularly when she coughs.  She has a chronic cough that has persisted for twelve years, which typically wakes her up at night. Last night, she was unable to cough up phlegm due to the pain, which she describes as severe enough to prevent effective coughing. The pain is exacerbated by coughing and is now present in her back as well.  She has a history of asthma and uses Advair and albuterol inhalers. She also takes Tylenol daily. There is no recent indigestion reported.  She has a history of hiatal hernia and takes pantoprazole and famotidine for acid reflux. She mentions noticing 'little lumps' at times in her abdomen but has no known history of ulcers.  In December, a CT scan revealed multiple nodules, with one showing significant growth over three months, prompting a PET scan and subsequent biopsy. She is awaiting final results to confirm the presence of cancer, as suggested by the initial findings.    Outpatient Encounter Medications as of 03/27/2023  Medication Sig   acetaminophen (TYLENOL) 500 MG tablet Take 500 mg by mouth every 6 (six) hours as needed for mild pain.    albuterol (VENTOLIN HFA) 108 (90 Base) MCG/ACT inhaler Inhale 2 puffs into the lungs every 6 (six) hours as needed for shortness of breath or  wheezing.   aspirin EC 81 MG tablet Take 81 mg by mouth daily as needed for mild pain (Chest Pain). Swallow whole.   baclofen (LIORESAL) 20 MG tablet Take 1 tablet by mouth daily.   Cholecalciferol (VITAMIN D3) 50 MCG (2000 UT) TABS Take 1 tablet by mouth daily.   famotidine (PEPCID) 20 MG tablet Take 1 tablet (20 mg total) by mouth at bedtime.   fluticasone (FLONASE) 50 MCG/ACT nasal  spray Place 2 sprays into both nostrils daily.   fluticasone-salmeterol (WIXELA INHUB) 250-50 MCG/ACT AEPB Inhale 1 puff into the lungs in the morning and at bedtime.   Hydroxychloroquine Sulfate 300 MG TABS Take 1 tablet by mouth daily.   ibuprofen (ADVIL) 200 MG tablet Take 200 mg by mouth daily as needed for mild pain (pain score 1-3) or moderate pain (pain score 4-6).   levothyroxine (SYNTHROID, LEVOTHROID) 88 MCG tablet Take 88 mcg by mouth daily before breakfast.   LORazepam (ATIVAN) 0.5 MG tablet Take 0.5 mg by mouth at bedtime.   Melatonin 5 MG TABS Take 6 mg by mouth daily.   metoprolol tartrate (LOPRESSOR) 25 MG tablet Take 1 tablet (25 mg total) by mouth daily.   pantoprazole (PROTONIX) 20 MG tablet Take 20 mg by mouth daily.   predniSONE (DELTASONE) 10 MG tablet Take 4 tablets (40 mg total) by mouth daily with breakfast for 5 days.   promethazine (PHENERGAN) 12.5 MG tablet Take 12.5 mg by mouth every 6 (six) hours as needed for nausea or vomiting.   promethazine-dextromethorphan (PROMETHAZINE-DM) 6.25-15 MG/5ML syrup Take 5 mLs by mouth every 6 (six) hours.   vitamin B-12 (CYANOCOBALAMIN) 250 MCG tablet Take 250 mcg by mouth daily.   No facility-administered encounter medications on file as of 03/27/2023.    Physical Exam: Blood pressure 122/68, pulse (!) 59, temperature 98.3 F (36.8 C), temperature source Oral, height 5' 2.5" (1.588 m), weight 147 lb 6.4 oz (66.9 kg), SpO2 97%. Gen:      No acute distress HEENT:  EOMI, sclera anicteric Neck:     No masses; no thyromegaly Lungs:    Clear to auscultation bilaterally; normal respiratory effort CV:         Regular rate and rhythm; no murmurs Abd:      + bowel sounds; soft, non-tender; no palpable masses, no distension Ext:    No edema; adequate peripheral perfusion Skin:      Warm and dry; no rash Neuro: alert and oriented x 3 Psych: normal mood and affect   Data Reviewed: Imaging: High-resolution CT 08/19/20-no lung nodules,  no significant interstitial lung disease. Subtle findings at lung base suggestive of atelectasis versus ground glass opacities/  High-resolution CT chest 08/07/2021-stable lung nodules no significant interstitial lung disease.  PFTs: 02/03/2021 FVC 2.46 [90%], FEV1 1.91 [93%], F/F 77, TLC 4.73 [96%], DLCO 20.46 [110%] No obstruction but there is significant bronchodilator response and air trapping suggestive of airway disease.  Labs: RAST panel 03/12/2017- negative IgE 09/09/2020- 75 CBC 09/09/2020-WBC 6.3, eos 1.8%, absolute eosinophil count 113  Hypersensitivity pneumonitis 09/09/2020-negative Assessment & Plan Pulmonary nodules Multiple pulmonary nodules identified on CT scan, with one nodule showing significant growth over three months. Recent bronchoscopy performed with suspicion of cancer, awaiting final pathology results. Concern about potential malignancy and spread to other nodules. Possibility of early-stage cancer mentioned, with hopes that it has not spread. - Await final pathology results from bronchoscopy - Discuss results with her once available  Abdominal and back pain Acute onset of abdominal  and back pain following bronchoscopy. Pain described as burning, located from underneath the breast to the abdomen, radiating to the back, and exacerbated by coughing. Differential diagnosis includes post-procedural pain, muscle strain, or potential gastrointestinal issues such as acid reflux or hiatal hernia. No history of ulcers. Pain may be related to anesthesia or a flare-up of acid reflux. Potential infection considered but deemed unlikely.  - Order chest and abdominal x-ray - Continue pantoprazole and famotidine for acid reflux - Advise use of over-the-counter Tums for additional acid relief - Consider abdominal scan if pain persists  Moderate persistent asthma Likely has to ongoing LPR, GERD in the setting of hiatal hernia, postnasal drip She continues onPPI, H2 blocker and  first-generation antihistamine, Flonase  PFTs reviewed with significant bronchodilator response and not trapping suggestive of small airways disease and asthma phenotype though she does not have peripheral eosinophils or IgE elevation.  Asthma is also supported by the fact that steroids make her cough better  Persistent cough despite intermittent use of Breo due to cost. Discussed alternative inhaler options and decided to switch to Wixella BID due to cost and similar mechanism of action.  Gastroesophageal Reflux Disease (GERD) Persistent cough and history of hiatal hernia surgery. Currently on Pantoprazole 20mg  daily and Famotidine 20mg  daily for acid reflux management.Follows with Dr. Chales Abrahams from gastroenterology  -Continue Pantoprazole 20mg  daily and Famotidine 20mg  daily.  Concern for ILD She has occasional exposure to down pillows We reviewed the CT scan on ILD conference on 10/07/2020.  Consensus was that there is no significant interstitial lung disease with very subtle findings at the base.  Possibility was raised of DIPNICH syndrome but not conclusive.    Follow up CT reviewed with no significant interstitial lung disease  OSA She has been nontolerant with CPAP and been off therapy since 2015.  She has lost a lot of weight in the interim and does not want to retry CPAP  Plan/Recommendations: Prednisone for 5 days Chest, abdominal x-ray Continue Wixela Continue PPI, H2 blockers Start Flonase, chlorphentermine for postnasal drip and chronic cough.  Chilton Greathouse MD [facility] 03/27/2023, 4:12 PM  CC: Gordan Payment., MD

## 2023-03-27 NOTE — Telephone Encounter (Signed)
 Chief Complaint: post-op cough Symptoms: chest tightness, productive cough Frequency: last night Pertinent Negatives: Patient denies fever, severe CP, SOB, wheezing Disposition: [] ED /[] Urgent Care (no appt availability in office) / [x] Appointment(In office/virtual)/ []  [facility]/ [] Home Care/ [] Refused Recommended Disposition /[] [facility]/ []  Follow-up with PCP Additional Notes: Pt c/o post-bronchoscopy cough/lower chest tightness since last night. Pt reports taking respiratory INH not as prescribed - triager reinforced usage per chart. Denies fever, SOB, wheezing, severe CP. Pt denies "elephant on chest" feeling, and reports it is all lower chest under breast and only when coughing. Scheduled patient per protocol on 03/27/2023. Patient verbalized understanding and to call back/911 with worsening symptoms.     Copied from CRM [id number]. Topic: Clinical - Red Word Triage >> Mar 27, 2023 10:00 AM Theodis Sato wrote: Red Word that prompted transfer to Nurse Triage:  Patient is in pain from her bronchoscopy yesterday 3/25  - sore throat, worsening cough, chest pain. Reason for Disposition  Chest pain(s) lasting a few seconds from coughing  [1] Known COPD or other severe lung disease (i.e., bronchiectasis, cystic fibrosis, lung surgery) AND [2] worsening symptoms (i.e., increased sputum purulence or amount, increased breathing difficulty  Answer Assessment - Initial Assessment Questions E2C2 Pulmonary Triage - Initial Assessment Questions "Chief Complaint (e.g., cough, sob, wheezing, fever, chills, sweat or additional symptoms) *Go to specific symptom protocol after initial questions. CP, Cough, sore throat  "How long have symptoms been present?" Last night   MEDICINES:   "Have you used any OTC meds to help with symptoms?" Yes If yes, ask "What medications?" Tylenol - took last night before bed  "Have you used your inhalers/maintenance medication?" Yes If yes,  "What medications?" Wixela - 1 puff once daily Albuterol PRN - haven't needed to do that  If inhaler, ask "How many puffs and how often?" Note: Review instructions on medication in the chart. See above   OXYGEN: "Do you wear supplemental oxygen?" No If yes, "How many liters are you supposed to use?" N/a  "Do you monitor your oxygen levels?" No If yes, "What is your reading (oxygen level) today?" N/a  "What is your usual oxygen saturation reading?"  (Note: Pulmonary O2 sats should be 90% or greater) N/a   1. LOCATION: "Where does it hurt?"       Lower chest on R side Breast down to lower part of ribs It hurts when I cough 2. RADIATION: "Does the pain go anywhere else?" (e.g., into neck, jaw, arms, back)     Last night back was bothering me - was suppose to have back surgery but it was cancelled - "hard to tell because I normally have back pain, but I don't think it's related" 3. ONSET: "When did the chest pain begin?" (Minutes, hours or days)      Last night 4. PATTERN: "Does the pain come and go, or has it been constant since it started?"  "Does it get worse with exertion?"      Constant - "it's there when I cough" 5. DURATION: "How long does it last" (e.g., seconds, minutes, hours)     With coughing 6. SEVERITY: "How bad is the pain?"  (e.g., Scale 1-10; mild, moderate, or severe)    - MILD (1-3): doesn't interfere with normal activities     - MODERATE (4-7): interferes with normal activities or awakens from sleep    - SEVERE (8-10): excruciating pain, unable to do any normal activities       8/10  when coughing 7. CARDIAC RISK FACTORS: "Do you have any history of heart problems or risk factors for heart disease?" (e.g., angina, prior heart attack; diabetes, high blood pressure, high cholesterol, smoker, or strong family history of heart disease)     HTN 8. PULMONARY RISK FACTORS: "Do you have any history of lung disease?"  (e.g., blood clots in lung, asthma, emphysema, birth  control pills)     Asthma Denies PE 9. CAUSE: "What do you think is causing the chest pain?"     Coughing and surgery "because it didn't start until last night" 10. OTHER SYMPTOMS: "Do you have any other symptoms?" (e.g., dizziness, nausea, vomiting, sweating, fever, difficulty breathing, cough)       No to all above except cough  Answer Assessment - Initial Assessment Questions 1. ONSET: "When did the cough begin?"      Last night  3. SPUTUM: "Describe the color of your sputum" (none, dry cough; clear, white, yellow, green)     white 4. HEMOPTYSIS: "Are you coughing up any blood?" If so ask: "How much?" (flecks, streaks, tablespoons, etc.)     no 5. DIFFICULTY BREATHING: "Are you having difficulty breathing?" If Yes, ask: "How bad is it?" (e.g., mild, moderate, severe)    - MILD: No SOB at rest, mild SOB with walking, speaks normally in sentences, can lie down, no retractions, pulse < 100.    - MODERATE: SOB at rest, SOB with minimal exertion and prefers to sit, cannot lie down flat, speaks in phrases, mild retractions, audible wheezing, pulse 100-120.    - SEVERE: Very SOB at rest, speaks in single words, struggling to breathe, sitting hunched forward, retractions, pulse > 120      denies  Protocols used: Chest Pain-A-AH, Cough - Acute Productive-A-AH

## 2023-03-27 NOTE — Patient Instructions (Signed)
 VISIT SUMMARY:  Today, we discussed your recent abdominal pain and cough following your bronchoscopy. We reviewed your history of asthma and hiatal hernia, and we talked about the next steps for managing your symptoms and awaiting your biopsy results.  YOUR PLAN:  -PULMONARY NODULES: Pulmonary nodules are small growths in the lungs. One of your nodules has shown significant growth, and we are awaiting the final pathology results from your recent bronchoscopy to determine if it is cancerous. We will discuss the results with you once they are available.  -ASTHMA: Asthma is a condition that affects your airways and can cause difficulty breathing. You are currently managing it with Advair and albuterol inhalers. Due to your recent congestion and difficulty coughing up phlegm, we have prescribed prednisone to help with a potential flare-up. Please continue using your Advair and albuterol inhalers as directed.  -ABDOMINAL AND BACK PAIN: Your abdominal and back pain started after your bronchoscopy and may be due to post-procedural pain, muscle strain, or acid reflux. We have ordered chest and abdominal x-rays to investigate further. Continue taking pantoprazole and famotidine for acid reflux, and you can use over-the-counter Tums for additional relief. If the pain persists, we may consider an abdominal scan.  -HIATAL HERNIA: A hiatal hernia occurs when part of your stomach pushes up through your diaphragm. This can cause acid reflux and may be contributing to your current abdominal pain. Continue taking pantoprazole and famotidine, and you can use over-the-counter Tums for additional relief.  INSTRUCTIONS:  Please follow up with the nurse practitioner on April 4th to discuss your results and ongoing symptoms. We will contact you with the x-ray results by tomorrow morning.

## 2023-03-28 ENCOUNTER — Encounter (HOSPITAL_COMMUNITY): Payer: Self-pay | Admitting: Emergency Medicine

## 2023-03-28 ENCOUNTER — Telehealth: Payer: Self-pay | Admitting: *Deleted

## 2023-03-28 LAB — CYTOLOGY - NON PAP

## 2023-03-28 NOTE — Telephone Encounter (Signed)
 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 She was sched for a Bronc FU w/Ms. Groce for Friday as a MYCHART. She does not do MYCHART and we do not do in person visits after noon on Fridays. Ms. [name] has no appts until far out on the sched. Please see if she can work her in and call PT @ [phone]

## 2023-03-28 NOTE — Telephone Encounter (Signed)
 Spoke with patient regarding prior message . Patient was very upset about her f/u Bronch with sarah on 04/05/2022.Patient stated she does not know how to use mychart and her office visit was scheduled virtual. Advised patient I was able to offer patient a in person visit on 04/03/2022 at 9:30am  with sarah . Patient stated she is unsure how to get into mychart advised patient someone could possibly help her or on her after visit summary she can get in touch with someone to help her . Patient thanked me and the office to get her visit changed to in person.  Patient's voice was understanding.Nothing else further needed.

## 2023-04-03 ENCOUNTER — Telehealth: Payer: Self-pay | Admitting: Pulmonary Disease

## 2023-04-03 ENCOUNTER — Encounter: Payer: Self-pay | Admitting: Acute Care

## 2023-04-03 ENCOUNTER — Ambulatory Visit (INDEPENDENT_AMBULATORY_CARE_PROVIDER_SITE_OTHER): Admitting: Acute Care

## 2023-04-03 VITALS — BP 142/72 | HR 68 | Ht 62.0 in | Wt 147.2 lb

## 2023-04-03 DIAGNOSIS — D3A090 Benign carcinoid tumor of the bronchus and lung: Secondary | ICD-10-CM

## 2023-04-03 DIAGNOSIS — R52 Pain, unspecified: Secondary | ICD-10-CM

## 2023-04-03 DIAGNOSIS — F419 Anxiety disorder, unspecified: Secondary | ICD-10-CM | POA: Diagnosis not present

## 2023-04-03 DIAGNOSIS — Z9889 Other specified postprocedural states: Secondary | ICD-10-CM

## 2023-04-03 NOTE — Telephone Encounter (Signed)
 SG, please advise.

## 2023-04-03 NOTE — Telephone Encounter (Signed)
 Received call report from [facility] with [place] radiology on patient's CXR and DG abd done on 04/03/2023. Dr. Isaiah Serge please review the result/impression copied below:  IMPRESSION: 1. No acute chest abnormality. 2. Possible mild atelectasis in left lower lobe.  MPRESSION: 1. Mildly dilated small bowel loops in the central abdomen. Based on recent bronchoscopy, suspect this is associated with an ileus. Early obstructive process cannot be excluded and recommend clinical correlation.   These results will be called to the ordering clinician or representative by the Radiologist Assistant, and communication documented in the PACS or [employer].    Please advise, thank you.

## 2023-04-03 NOTE — Progress Notes (Unsigned)
 History of Present Illness [name] is a 78 y.o. female  never smoker with ***   04/03/2023 Pt. Presents for follow up after bronchoscopy with biopsies.   Test Results: FINAL MICROSCOPIC DIAGNOSIS:  A. LUNG, RML, FINE NEEDLE ASPIRATION:  - Carcinoid tumor, not otherwise specified.   B. LUNG, RML, BRUSHING:  - Atypical cells present      Latest Ref Rng & Units 03/26/2023    5:55 AM 12/21/2022    9:50 AM 09/09/2020    2:41 PM  CBC  WBC 4.0 - 10.5 K/uL 6.7  8.4  6.3   Hemoglobin 12.0 - 15.0 g/dL 95.6  21.3  08.6   Hematocrit 36.0 - 46.0 % 42.2  41.5  40.8   Platelets 150 - 400 K/uL 250  280  260.0        Latest Ref Rng & Units 03/26/2023    5:55 AM 12/21/2022    9:50 AM 07/08/2017    2:34 PM  BMP  Glucose 70 - 99 mg/dL 578  85  88   BUN 8 - 23 mg/dL 19  21  14    Creatinine 0.44 - 1.00 mg/dL 4.69  6.29  5.28   BUN/Creat Ratio 12 - 28  24    Sodium 135 - 145 mmol/L 144  142  142   Potassium 3.5 - 5.1 mmol/L 4.4  4.5  4.5   Chloride 98 - 111 mmol/L 109  107  110   CO2 22 - 32 mmol/L 26  21  26    Calcium 8.9 - 10.3 mg/dL 9.7  9.4  9.2     BNP No results found for: "BNP"  ProBNP No results found for: "PROBNP"  PFT    Component Value Date/Time   FEV1PRE 1.31 02/03/2021 1136   FEV1POST 1.91 02/03/2021 1136   FVCPRE 1.80 02/03/2021 1136   FVCPOST 2.46 02/03/2021 1136   TLC 4.73 02/03/2021 1136   DLCOUNC 20.46 02/03/2021 1136   PREFEV1FVCRT 72 02/03/2021 1136   PSTFEV1FVCRT 77 02/03/2021 1136    DG Chest Port 1 View Result Date: 03/26/2023 CLINICAL DATA:  Status post bronchoscopy and biopsy. EXAM: PORTABLE CHEST 1 VIEW COMPARISON:  Chest radiograph dated 12/08/2019 and CT dated 03/22/2023. FINDINGS: No focal consolidation, pleural effusion, or pneumothorax. A biopsy clip noted over the medial right lung base. The cardiac silhouette is within normal limits. No acute osseous pathology. IMPRESSION: No active disease. No pneumothorax. Electronically Signed   By: Elgie Collard M.D.   On: 03/26/2023 11:32   DG C-ARM BRONCHOSCOPY Result Date: 03/26/2023 C-ARM BRONCHOSCOPY: Fluoroscopy was utilized by the requesting physician.  No radiographic interpretation.   CT Super D Chest Wo Contrast Result Date: 03/22/2023 CLINICAL DATA:  Follow-up of pulmonary nodule. EXAM: CT CHEST WITHOUT CONTRAST TECHNIQUE: Multidetector CT imaging of the chest was performed using thin slice collimation for electromagnetic bronchoscopy planning purposes, without intravenous contrast. RADIATION DOSE REDUCTION: This exam was performed according to the departmental dose-optimization program which includes automated exposure control, adjustment of the mA and/or kV according to patient size and/or use of iterative reconstruction technique. COMPARISON:  PET of 02/28/2023. Chest CTs including back to 08/19/2020. FINDINGS: Cardiovascular: Aortic atherosclerosis. Tortuous thoracic aorta. Normal heart size, without pericardial effusion. Three vessel coronary artery calcification. Mediastinum/Nodes: No mediastinal or hilar adenopathy, given limitations of unenhanced CT. Small hiatal hernia. Surgical changes at the gastroesophageal junction. Lungs/Pleura: No pleural fluid. Mild centrilobular emphysema. Bibasilar scarring. There are multiple (at least 10) millimetric nodules, which are primarily  felt to be similar to 08/19/2020. An index lingular 4 mm nodule on 58/302 is unchanged. The nodule of interest, in the right middle lobe, measures 7 x 8 mm on 69/302. 7 x 6 mm when remeasured on 08/19/2020. Present at 5 mm on image 73/4 of the 06/07/2017 exam. Upper Abdomen: Cholecystectomy. Normal imaged portions of the liver, spleen, pancreas, adrenal glands, kidneys. Musculoskeletal: Lower thoracic spondylosis. IMPRESSION: 1. The right middle lobe nodule of interest has minimally enlarged since 2022 and has been present back to at least 2019. Strongly favored to be benign. Given slow growth over prior exams, as well  as hypermetabolism on 02/28/2023 PET, this could be re-evaluated with 1 further follow-up chest CT at 1 year. 2.  No acute process in the chest. 3. Aortic atherosclerosis (ICD10-I70.0), coronary artery atherosclerosis and emphysema (ICD10-J43.9). 4. Small hiatal hernia. Electronically Signed   By: Jeronimo Greaves M.D.   On: 03/22/2023 12:11     Past medical hx Past Medical History:  Diagnosis Date   Abnormal chest x-ray with multiple lung nodules 08/22/2017   Abnormal weight loss    Age-related osteoporosis without current pathological fracture 03/15/2015   Last Assessment & Plan:  Formatting of this note might be different from the original. Relevant Hx: Course: Daily Update: Today's Plan:discussed in depth with her and she is going to try the boniva when she gets back from her [place] trip, and she was advised to increase her vitamin D 3 to 3000 IU daily after review of her lab and we reviewed her DEXA last year and she will be due for this in 2018  El   Allergy    Alopecia    Anemia    Anxiety    antianxiety med. usually at night but can take during the day for anxiety   Arthralgia of hip 10/26/2019   Arthritis    both knees & hands  also the back & neck   Asthma    Atrial fibrillation (HCC)    pt. reports that [facility] that she had a "slight afib.", no further test needed (01/07/14 cardiology note does not mention any afib, just poor anterior r wave progression)   Breast pain, right 08/29/2021   Cataract    Cervical radiculopathy 01/19/2017   Chronic cough    Chronic rhinitis 12/04/2012   Followed in Pulmonary clinic/ [facility]/ [facility]   - sinus ct 10/23/2012 >>> Clear paranasal sinuses. - trial of qid 1st gen H1 02/12/2013 > not effective 05/28/13        Complication of anesthesia    Coronary artery disease involving native coronary artery of native heart without angina pectoris 06/08/2016   Formatting of this note might be different from the original. No longer sees cardiolgist.  ST  okay.  Imaging with calcium   Cough variant asthma 06/18/2013   - Methacholine challenge test 06/03/13 > equivocal but clinically caused tightness and made her cough - hfa 75% p coaching 06/17/13 > Trial of dulera 100 2bid started 06/18/2013     Degenerative cervical disc 06/08/2016   Depression    Diaphragmatic hernia    Dizziness 12/11/2019   Elbow tendonitis    Endometriosis    Gastric polyp    Gastroenteritis    GERD (gastroesophageal reflux disease)    Hiatal hernia 11/24/2013   High risk medication use 03/15/2015   History of colonic polyps 02/09/2019   History of hiatal hernia    pt. has been told that the hiatal hernia has reappeared  History of melanoma 06/17/2017   History of pneumonia    Hypercholesteremia    Hyperlipidemia    Hypertension    pt. reports that she use to take antihtn med, but reports since weight loss she has been able to come off.     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 Following with [facility].  Stabel.Neg PET scan   Malaise and fatigue 03/15/2015   Last Assessment & Plan:  Formatting of this note might be different from the original. 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 reports that its severe, but doesn't use it anymore, since weight loss.   Study done in [place], not sure where.    Osteoarthritis     Osteoporosis    PONV (postoperative nausea and vomiting)    Prediabetes 11/18/2019   Primary insomnia 03/15/2015   Primary osteoarthritis involving multiple joints 03/15/2015   Last Assessment & Plan:  Formatting of this note might be different from the original. 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   Restless leg syndrome 11/18/2017   Right kidney mass 05/31/2021   S/P knee replacement 01/29/2014   Seasonal allergic rhinitis due to pollen 03/17/2019   Sinus bradycardia 12/07/2019   Sinus bradycardia by electrocardiogram 12/07/2019   Sleep apnea    does not wear a CPAP   Upper airway cough syndrome 08/04/2012   Followed in Pulmonary clinic/ [facility]/ [facility]  -Kozlow eval around 2012 pos dust/ roach  Neg resp to singulair  - PFT's 02/2010 nl in effort dep portion of f/v loop, lung vol and dlcos also nl  - CT chest 05/27/12 wnl x small HH - 08/04/12   Alpha One AT >  MM - informed 09/10/2012 had not received demerol rx on 09/02/12  - sinus ct 10/23/2012 >>> Clear paranasal sinuses. - responded to neuronti   Vitamin B 12 deficiency    Vitamin D deficiency      Social History   Tobacco Use   Smoking status: Never    Passive exposure: Past   Smokeless tobacco: Never  Vaping Use   Vaping status: Never Used  Substance Use Topics   Alcohol use: No    Comment: rare for holiday consumption only   Drug use: No    Ms.Sassone reports that she has never smoked. She has been exposed to tobacco smoke. She has never used smokeless tobacco. She reports that she does not drink alcohol and does not use drugs.  Tobacco Cessation: Counseling given: Not Answered   Past  surgical hx, Family hx, Social hx all reviewed.  Current Outpatient Medications on File Prior to Visit  Medication Sig   acetaminophen (TYLENOL) 500 MG tablet Take 500 mg by mouth every 6 (six) hours as needed for mild pain.    albuterol (VENTOLIN HFA) 108 (90 Base) MCG/ACT inhaler Inhale 2 puffs into the lungs every 6 (six) hours as needed for shortness of breath or wheezing.   aspirin EC 81 MG tablet Take 81 mg by mouth daily as needed for mild pain (Chest Pain). Swallow whole.   baclofen (LIORESAL) 20 MG tablet Take 1 tablet by mouth daily.   Cholecalciferol (VITAMIN D3) 50 MCG (2000 UT) TABS Take 1 tablet by mouth daily.   famotidine (PEPCID) 20 MG tablet Take 1 tablet (20 mg total) by mouth at bedtime.   fluticasone (FLONASE) 50 MCG/ACT nasal spray Place 2 sprays into both nostrils daily.   fluticasone-salmeterol (WIXELA INHUB) 250-50 MCG/ACT AEPB Inhale 1 puff into the lungs in the morning and at bedtime.   Hydroxychloroquine Sulfate 300 MG TABS Take 1 tablet by mouth daily.   ibuprofen (ADVIL) 200 MG tablet Take 200 mg by mouth daily as needed for mild pain (pain score 1-3) or moderate pain (pain score 4-6).   levothyroxine (SYNTHROID, LEVOTHROID) 88 MCG tablet Take 88 mcg by mouth daily before breakfast.   LORazepam (ATIVAN) 0.5 MG tablet Take 0.5 mg by mouth at bedtime.   Melatonin 5 MG TABS Take 6 mg by mouth daily.   metoprolol tartrate (LOPRESSOR) 25 MG tablet Take 1 tablet (25 mg total) by mouth daily.   pantoprazole (PROTONIX) 20 MG tablet Take 20 mg by mouth daily.   promethazine (PHENERGAN) 12.5 MG tablet Take 12.5 mg by mouth every 6 (six) hours as needed for nausea or vomiting.   promethazine-dextromethorphan (PROMETHAZINE-DM) 6.25-15 MG/5ML syrup Take 5 mLs by mouth every 6 (six) hours.   vitamin B-12 (CYANOCOBALAMIN) 250 MCG tablet Take 250 mcg by mouth daily.   No current facility-administered medications on file prior to visit.     Allergies  Allergen Reactions    Iodinated Contrast Media Rash   Neurontin [Gabapentin] Other (See Comments)    Severe falls,dizziness, forgetful    Cefdinir Other (See Comments)   Ciprofloxacin Diarrhea   Codeine Nausea And Vomiting   Egg-Derived Products     GI upset per patient   Misc. Sulfonamide Containing Compounds Other (See Comments)   Other Nausea And Vomiting    General anesthesia    Oxycodone Nausea And Vomiting   Tramadol     REACTION: GI discomfort with high doses   Cyclobenzaprine Palpitations   Sulfa Antibiotics Rash    Review Of Systems:  Constitutional:   No  weight loss, night sweats,  Fevers, chills, fatigue, or  lassitude.  HEENT:   No headaches,  Difficulty swallowing,  Tooth/dental problems, or  Sore throat,                No sneezing, itching, ear ache, nasal congestion, post nasal drip,   CV:  No chest pain,  Orthopnea, PND, swelling in lower extremities, anasarca, dizziness, palpitations, syncope.   GI  No heartburn, indigestion, abdominal pain, nausea, vomiting, diarrhea, change in bowel habits, loss of appetite, bloody stools.   Resp: No shortness of breath with exertion or at rest.  No excess mucus, no productive cough,  No non-productive cough,  No coughing up of blood.  No change in color of mucus.  No wheezing.  No chest wall  deformity  Skin: no rash or lesions.  GU: no dysuria, change in color of urine, no urgency or frequency.  No flank pain, no hematuria   MS:  No joint pain or swelling.  No decreased range of motion.  No back pain.  Psych:  No change in mood or affect. No depression or anxiety.  No memory loss.   Vital Signs BP (!) 142/72 (BP Location: Left Arm, Patient Position: Sitting, Cuff Size: Normal)   Pulse 68   Ht 5\' 2"  (1.575 m)   Wt 147 lb 3.2 oz (66.8 kg)   SpO2 98%   BMI 26.92 kg/m    Physical Exam:  General- No distress,  A&Ox3 ENT: No sinus tenderness, TM clear, pale nasal mucosa, no oral exudate,no post nasal drip, no LAN Cardiac: S1, S2,  regular rate and rhythm, no murmur Chest: No wheeze/ rales/ dullness; no accessory muscle use, no nasal flaring, no sternal retractions Abd.: Soft Non-tender Ext: No clubbing cyanosis, edema Neuro:  normal strength Skin: No rashes, warm and dry Psych: normal mood and behavior   Assessment/Plan  No problem-specific Assessment & Plan notes found for this encounter.    Bevelyn Ngo, NP 04/03/2023  9:45 AM

## 2023-04-03 NOTE — Telephone Encounter (Signed)
 Tiffany calling with call report for this PT. [facility].

## 2023-04-03 NOTE — Telephone Encounter (Signed)
 Already addressed at clinic visit today. Nothing further needed

## 2023-04-03 NOTE — Patient Instructions (Addendum)
 It is good to see you today. I am glad you are recovering from your bronchoscopy and biopsies. 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 I have referred you to surgery to discuss having this removed. I will also order PFT's. You will get a call to get these scheduled. Follow up as needed. Call if you need [place].  Please contact office for sooner follow up if symptoms do not improve or worsen or seek emergency care

## 2023-04-04 ENCOUNTER — Encounter: Payer: Self-pay | Admitting: Acute Care

## 2023-04-05 ENCOUNTER — Telehealth: Admitting: Acute Care

## 2023-04-10 ENCOUNTER — Ambulatory Visit (HOSPITAL_COMMUNITY)
Admission: RE | Admit: 2023-04-10 | Discharge: 2023-04-10 | Disposition: A | Discharge status: Home / Self Care | Source: Ambulatory Visit | Attending: Acute Care | Admitting: Acute Care

## 2023-04-10 DIAGNOSIS — Z79899 Other long term (current) drug therapy: Secondary | ICD-10-CM | POA: Insufficient documentation

## 2023-04-10 DIAGNOSIS — D3A090 Benign carcinoid tumor of the bronchus and lung: Secondary | ICD-10-CM | POA: Diagnosis present

## 2023-04-10 DIAGNOSIS — J984 Other disorders of lung: Secondary | ICD-10-CM | POA: Diagnosis not present

## 2023-04-10 DIAGNOSIS — R062 Wheezing: Secondary | ICD-10-CM | POA: Insufficient documentation

## 2023-04-10 LAB — PULMONARY FUNCTION TEST
DL/VA % pred: 88 %
DL/VA: 3.66 ml/min/mmHg/L
DLCO unc % pred: 84 %
DLCO unc: 15.57 ml/min/mmHg
FEF 25-75 Post: 1.12 L/s
FEF 25-75 Pre: 1.48 L/s
FEF2575-%Change-Post: -24 %
FEF2575-%Pred-Post: 71 %
FEF2575-%Pred-Pre: 95 %
FEV1-%Change-Post: -5 %
FEV1-%Pred-Post: 84 %
FEV1-%Pred-Pre: 89 %
FEV1-Post: 1.68 L
FEV1-Pre: 1.78 L
FEV1FVC-%Change-Post: -1 %
FEV1FVC-%Pred-Pre: 100 %
FEV6-%Change-Post: -3 %
FEV6-%Pred-Post: 90 %
FEV6-%Pred-Pre: 93 %
FEV6-Post: 2.27 L
FEV6-Pre: 2.36 L
FEV6FVC-%Pred-Post: 105 %
FEV6FVC-%Pred-Pre: 105 %
FVC-%Change-Post: -3 %
FVC-%Pred-Post: 85 %
FVC-%Pred-Pre: 88 %
FVC-Post: 2.27 L
FVC-Pre: 2.36 L
Post FEV1/FVC ratio: 74 %
Post FEV6/FVC ratio: 100 %
Pre FEV1/FVC ratio: 75 %
Pre FEV6/FVC Ratio: 100 %
RV % pred: 93 %
RV: 2.12 L
TLC % pred: 96 %
TLC: 4.71 L

## 2023-04-10 MED ORDER — ALBUTEROL SULFATE (2.5 MG/3ML) 0.083% IN NEBU
2.5000 mg | INHALATION_SOLUTION | Freq: Once | RESPIRATORY_TRACT | Status: AC
  Administered 2023-04-10: 2.5 mg via RESPIRATORY_TRACT

## 2023-04-17 ENCOUNTER — Ambulatory Visit: Payer: Self-pay | Admitting: Pulmonary Disease

## 2023-04-17 ENCOUNTER — Ambulatory Visit (HOSPITAL_BASED_OUTPATIENT_CLINIC_OR_DEPARTMENT_OTHER)
Admit: 2023-04-17 | Discharge: 2023-04-17 | Disposition: A | Discharge status: Home / Self Care | Attending: Family Medicine | Admitting: Family Medicine

## 2023-04-17 ENCOUNTER — Ambulatory Visit (HOSPITAL_BASED_OUTPATIENT_CLINIC_OR_DEPARTMENT_OTHER)
Admission: RE | Admit: 2023-04-17 | Discharge: 2023-04-17 | Disposition: A | Discharge status: Home / Self Care | Source: Ambulatory Visit | Attending: Nurse Practitioner | Admitting: Nurse Practitioner

## 2023-04-17 ENCOUNTER — Encounter (HOSPITAL_BASED_OUTPATIENT_CLINIC_OR_DEPARTMENT_OTHER): Payer: Self-pay

## 2023-04-17 ENCOUNTER — Encounter (HOSPITAL_BASED_OUTPATIENT_CLINIC_OR_DEPARTMENT_OTHER): Payer: Self-pay | Admitting: Nurse Practitioner

## 2023-04-17 VITALS — BP 145/85 | HR 65 | Temp 98.3°F | Resp 22

## 2023-04-17 DIAGNOSIS — R0789 Other chest pain: Secondary | ICD-10-CM

## 2023-04-17 DIAGNOSIS — D3A090 Benign carcinoid tumor of the bronchus and lung: Secondary | ICD-10-CM | POA: Diagnosis not present

## 2023-04-17 NOTE — Discharge Instructions (Signed)
 Your physical examination is reassuring - the discomfort is easily reproducible with palpation and movement.  However, a chest x-ray was completed out of abundance of caution and we are awaiting those results.  Should your treatment plan need to change, we will call you.  Use your Albuterol inhaler as we discussed, may use Tylenol for the discomfort, application of warm compresses and topical lidocaine patches.  Let your lung doctor know that you were here today and what your concerns are.

## 2023-04-17 NOTE — ED Provider Notes (Signed)
 [facility]    CSN: [id number] Arrival date & time: 04/17/23  1539      History   Chief Complaint Chief Complaint  Patient presents with   Cough    mild SOB/hx of lung nodules - Entered by patient   Shortness of Breath    HPI [name] is a 78 y.o. female.   Patient is here requesting evaluation for the new onset of left lower chest wall discomfort and feeling of shortness of breath last night.  Patient states that this started around 5 PM and lasted until about 2 AM.  She was able to take some Tylenol and an anxiety medication which did help her symptoms.  Patient states that the discomfort is located to her left lower chest wall and is reproducible with pain and palpation.  No reported fever, current chest pain, acutely worsening on chronic cough, shortness of breath, abdominal pain, vomiting, or diarrhea.  Overall she is feeling much better.  However, because of her history of lung cancer and upcoming surgical procedures-she came for evaluation out of abundance of caution.  She does have a history of asthma but did not use her rescue inhaler.  States she has had a cough for the past 12 years and a runny nose for the past month.  The history is provided by the patient.  Cough Associated symptoms: myalgias, rhinorrhea and shortness of breath   Associated symptoms: no chest pain, no chills, no headaches and no rash   Shortness of Breath Associated symptoms: cough   Associated symptoms: no abdominal pain, no chest pain, no headaches, no rash and no vomiting     Past Medical History:  Diagnosis Date   Abnormal chest x-ray with multiple lung nodules 08/22/2017   Abnormal weight loss    Age-related osteoporosis without current pathological fracture 03/15/2015   Last Assessment & Plan:  Formatting of this note might be different from the original. Relevant Hx: Course: Daily Update: Today's Plan:discussed in depth with her and she is going to try the boniva when she gets  back from her [place] trip, and she was advised to increase her vitamin D 3 to 3000 IU daily after review of her lab and we reviewed her DEXA last year and she will be due for this in 2018  El   Allergy    Alopecia    Anemia    Anxiety    antianxiety med. usually at night but can take during the day for anxiety   Arthralgia of hip 10/26/2019   Arthritis    both knees & hands  also the back & neck   Asthma    Atrial fibrillation (HCC)    pt. reports that [facility] that she had a "slight afib.", no further test needed (01/07/14 cardiology note does not mention any afib, just poor anterior r wave progression)   Breast pain, right 08/29/2021   Cataract    Cervical radiculopathy 01/19/2017   Chronic cough    Chronic rhinitis 12/04/2012   Followed in Pulmonary clinic/ [facility]/ [facility]   - sinus ct 10/23/2012 >>> Clear paranasal sinuses. - trial of qid 1st gen H1 02/12/2013 > not effective 05/28/13        Complication of anesthesia    Coronary artery disease involving native coronary artery of native heart without angina pectoris 06/08/2016   Formatting of this note might be different from the original. No longer sees cardiolgist.  ST okay.  Imaging with calcium   Cough variant  asthma 06/18/2013   - Methacholine challenge test 06/03/13 > equivocal but clinically caused tightness and made her cough - hfa 75% p coaching 06/17/13 > Trial of dulera 100 2bid started 06/18/2013     Degenerative cervical disc 06/08/2016   Depression    Diaphragmatic hernia    Dizziness 12/11/2019   Elbow tendonitis    Endometriosis    Gastric polyp    Gastroenteritis    GERD (gastroesophageal reflux disease)    Hiatal hernia 11/24/2013   High risk medication use 03/15/2015   History of colonic polyps 02/09/2019   History of hiatal hernia    pt. has been told that the hiatal hernia has reappeared   History of melanoma 06/17/2017   History of pneumonia    Hypercholesteremia    Hyperlipidemia    Hypertension     pt. reports that she use to take antihtn med, but reports since weight loss she has been able to come off.     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 Following with [facility].  Stabel.Neg PET scan   Malaise and fatigue 03/15/2015   Last Assessment & Plan:  Formatting of this note might be different from the original. 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 reports that its severe, but doesn't use it anymore, since weight loss.   Study done in [place], not sure where.    Osteoarthritis    Osteoporosis    PONV (postoperative nausea and vomiting)    Prediabetes 11/18/2019   Primary insomnia 03/15/2015   Primary osteoarthritis involving multiple joints 03/15/2015   Last Assessment & Plan:  Formatting of this note might be different from the original. 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   Restless leg syndrome 11/18/2017   Right kidney mass 05/31/2021   S/P knee replacement 01/29/2014   Seasonal allergic rhinitis due to pollen  03/17/2019   Sinus bradycardia 12/07/2019   Sinus bradycardia by electrocardiogram 12/07/2019   Sleep apnea    does not wear a CPAP   Upper airway cough syndrome 08/04/2012   Followed in Pulmonary clinic/ [facility]/ [place]  -Kozlow eval around 2012 pos dust/ roach  Neg resp to singulair  - PFT's 02/2010 nl in effort dep portion of f/v loop, lung vol and dlcos also nl  - CT chest 05/27/12 wnl x small HH - 08/04/12   Alpha One AT >  MM - informed 09/10/2012 had not received demerol rx on 09/02/12  - sinus ct 10/23/2012 >>> Clear paranasal sinuses. - responded  to neuronti   Vitamin B 12 deficiency    Vitamin D deficiency     Patient Active Problem List   Diagnosis Date Noted   Breast pain, right 08/29/2021   Caliectasis determined by ultrasound of kidney 06/14/2021   Right kidney mass 05/31/2021   Left lumbar pain 09/19/2020   Enlarged lymph nodes in armpit 05/09/2020   Vitamin B 12 deficiency    PONV (postoperative nausea and vomiting)    Osteoporosis    Osteoarthritis    OSA (obstructive sleep apnea)    Insomnia    Hypertension    Hyperlipidemia    Hypercholesteremia    History of hiatal hernia    Gastroenteritis    Gastric polyp    Endometriosis    Elbow tendonitis    Diaphragmatic hernia    Depression    Asthma    Chronic cough    SVT (supraventricular tachycardia) (HCC)    Arthritis    Anxiety    Alopecia    Abnormal weight loss    Dizziness 12/11/2019   Sinus bradycardia 12/07/2019   Sinus bradycardia by electrocardiogram 12/07/2019   Prediabetes 11/18/2019   Arthralgia of hip 10/26/2019   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   Seasonal allergic rhinitis due to pollen 03/17/2019   History of colonic polyps 02/09/2019   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   Restless leg syndrome 11/18/2017   Lung nodules 09/16/2017   Abnormal chest x-ray with multiple lung nodules 08/22/2017   History of melanoma 06/17/2017   Cervical radiculopathy 01/19/2017    Coronary artery disease involving native coronary artery of native heart without angina pectoris 06/08/2016   Degenerative cervical disc 06/08/2016   Age-related osteoporosis without current pathological fracture 03/15/2015   High risk medication use 03/15/2015   Hypothyroidism 03/15/2015   Malaise and fatigue 03/15/2015   Mixed hyperlipidemia 03/15/2015   Primary insomnia 03/15/2015   Primary osteoarthritis involving multiple joints 03/15/2015   Vitamin D deficiency 03/15/2015   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   Laryngopharyngeal reflux (LPR) 07/19/2014   S/P knee replacement 01/29/2014   GERD (gastroesophageal reflux disease) 11/24/2013   Hiatal hernia 11/24/2013   Cough variant asthma 06/18/2013   Chronic rhinitis 12/04/2012   Upper airway cough syndrome 08/04/2012   S/P Nissen fundoplication (without gastrostomy tube) procedure 05/13/2012   Melanoma (HCC) 2011    Past Surgical History:  Procedure Laterality Date   ABDOMINAL HYSTERECTOMY     APPENDECTOMY     BRONCHIAL BIOPSY  03/26/2023   Procedure: BRONCHOSCOPY, WITH BIOPSY;  Surgeon: [name], MD;  Location: [facility] ENDOSCOPY;  Service: Pulmonary;;   BRONCHIAL BRUSHINGS  03/26/2023   Procedure: BRONCHOSCOPY, WITH BRUSH BIOPSY;  Surgeon: [name], MD;  Location: [facility] ENDOSCOPY;  Service: Pulmonary;;   BRONCHIAL NEEDLE ASPIRATION BIOPSY  03/26/2023   Procedure: BRONCHOSCOPY, WITH NEEDLE ASPIRATION BIOPSY;  Surgeon: [name], MD;  Location: [facility] ENDOSCOPY;  Service: Pulmonary;;   CHOLECYSTECTOMY     COLONOSCOPY  05/09/2015   Colonic Polyps, moderate sigmoid diverticulosis. Bx: Tubular Adenomas. Negative   DILATION AND CURETTAGE, DIAGNOSTIC / THERAPEUTIC     ESOPHAGEAL DILATION  08/2016   EYE SURGERY     cataracts removed / w IOL   FUDUCIAL PLACEMENT  03/26/2023   Procedure: INSERTION, FIDUCIAL MARKER, GOLD;  Surgeon: [name], MD;  Location: [facility] ENDOSCOPY;  Service: Pulmonary;;    GALLBLADDER SURGERY     HERNIA REPAIR     HIATAL HERNIA REPAIR  2/14  HIATAL HERNIA REPAIR  2014   revision of 2018 in [place]   KNEE ARTHROSCOPY     both knees for torn cartilage    NERVE REPAIR Left 07/11/2017   Procedure: Excision of saphenous nerve left knee. Right knee intra-articular injection.;  Surgeon: Durene Romans, MD;  Location: [facility] ORS;  Service: Orthopedics;  Laterality: Left;  60 mins   OOPHORECTOMY     TONSILLECTOMY     TOTAL KNEE ARTHROPLASTY Left 01/29/2014   Procedure: LEFT TOTAL KNEE ARTHROPLASTY MCL REPAIR;  Surgeon: Verlee Rossetti, MD;  Location: [facility] OR;  Service: Orthopedics;  Laterality: Left;   VESICOVAGINAL FISTULA CLOSURE W/ TAH     VIDEO BRONCHOSCOPY WITH ENDOBRONCHIAL NAVIGATION Right 03/26/2023   Procedure: VIDEO BRONCHOSCOPY WITH ENDOBRONCHIAL NAVIGATION;  Surgeon: [name], MD;  Location: [facility] ENDOSCOPY;  Service: Pulmonary;  Laterality: Right;  WITH FLUORO WITH BIOPSY    OB History   No obstetric history on file.      Home Medications    Prior to Admission medications   Medication Sig Start Date End Date Taking? Authorizing Provider  acetaminophen (TYLENOL) 500 MG tablet Take 500 mg by mouth every 6 (six) hours as needed for mild pain.     [provider]  albuterol (VENTOLIN HFA) 108 (90 Base) MCG/ACT inhaler Inhale 2 puffs into the lungs every 6 (six) hours as needed for shortness of breath or wheezing.    [provider]  aspirin EC 81 MG tablet Take 81 mg by mouth daily as needed for mild pain (Chest Pain). Swallow whole.    [provider]  baclofen (LIORESAL) 20 MG tablet Take 1 tablet by mouth daily.    [provider]  Cholecalciferol (VITAMIN D3) 50 MCG (2000 UT) TABS Take 1 tablet by mouth daily.    [provider]  famotidine (PEPCID) 20 MG tablet Take 1 tablet (20 mg total) by mouth at bedtime. 06/16/21   Lynann Bologna, MD  fluticasone (FLONASE) 50 MCG/ACT nasal spray Place 2 sprays into  both nostrils daily. 07/25/20   [provider]  fluticasone-salmeterol (WIXELA INHUB) 250-50 MCG/ACT AEPB Inhale 1 puff into the lungs in the morning and at bedtime. 10/08/22   Mannam, Colbert Coyer, MD  Hydroxychloroquine Sulfate 300 MG TABS Take 1 tablet by mouth daily. 08/26/22   [provider]  ibuprofen (ADVIL) 200 MG tablet Take 200 mg by mouth daily as needed for mild pain (pain score 1-3) or moderate pain (pain score 4-6).    [provider]  levothyroxine (SYNTHROID, LEVOTHROID) 88 MCG tablet Take 88 mcg by mouth daily before breakfast.    [provider]  LORazepam (ATIVAN) 0.5 MG tablet Take 0.5 mg by mouth at bedtime.    [provider]  Melatonin 5 MG TABS Take 6 mg by mouth daily.    [provider]  metoprolol tartrate (LOPRESSOR) 25 MG tablet Take 1 tablet (25 mg total) by mouth daily. 12/21/22   Flossie Dibble, NP  pantoprazole (PROTONIX) 20 MG tablet Take 20 mg by mouth daily.    [provider]  promethazine (PHENERGAN) 12.5 MG tablet Take 12.5 mg by mouth every 6 (six) hours as needed for nausea or vomiting.    [provider]  promethazine-dextromethorphan (PROMETHAZINE-DM) 6.25-15 MG/5ML syrup Take 5 mLs by mouth every 6 (six) hours.    [provider]  vitamin B-12 (CYANOCOBALAMIN) 250 MCG tablet Take 250 mcg by mouth daily.    [provider]  Family History Family History  Problem Relation Age of Onset   Emphysema Father        smoked   Heart disease Father    Arthritis Father    Asthma Mother    Colon cancer Mother    Arthritis Mother    Asthma Brother    Arthritis Brother    Breast cancer Sister    Breast cancer Maternal Grandmother    Heart disease Maternal Grandmother    Heart disease Paternal Grandmother    Heart disease Paternal Aunt     Social History Social History   Tobacco Use   Smoking status: Never    Passive exposure: Past   Smokeless tobacco: Never   Vaping Use   Vaping status: Never Used  Substance Use Topics   Alcohol use: No    Comment: rare for holiday consumption only   Drug use: No     Allergies   Iodinated contrast media, Neurontin [gabapentin], Cefdinir, Ciprofloxacin, Codeine, Egg-derived products, Misc. sulfonamide containing compounds, Other, Oxycodone, Tramadol, Cyclobenzaprine, and Sulfa antibiotics   Review of Systems Review of Systems  Constitutional:  Negative for chills and fatigue.  HENT:  Positive for rhinorrhea. Negative for congestion.   Respiratory:  Positive for cough and shortness of breath. Negative for chest tightness.   Cardiovascular:  Negative for chest pain and palpitations.  Gastrointestinal:  Negative for abdominal pain, diarrhea and vomiting.  Genitourinary:  Negative for dysuria and hematuria.  Musculoskeletal:  Positive for myalgias. Negative for back pain.  Skin:  Negative for rash.  Neurological:  Negative for dizziness and headaches.  Psychiatric/Behavioral:  The patient is nervous/anxious.      Physical Exam Triage Vital Signs ED Triage Vitals  Encounter Vitals Group     BP 04/17/23 1602 (!) 145/85     Systolic BP Percentile --      Diastolic BP Percentile --      Pulse Rate 04/17/23 1602 65     Resp 04/17/23 1602 (!) 22     Temp 04/17/23 1602 98.3 F (36.8 C)     Temp Source 04/17/23 1602 Oral     SpO2 04/17/23 1602 97 %     Weight --      Height --      Head Circumference --      Peak Flow --      Pain Score 04/17/23 1603 8     Pain Loc --      Pain Education --      Exclude from Growth Chart --    No data found.  Updated Vital Signs BP (!) 145/85 (BP Location: Right Arm)   Pulse 65   Temp 98.3 F (36.8 C) (Oral)   Resp (!) 22   SpO2 97%   Visual Acuity Right Eye Distance:   Left Eye Distance:   Bilateral Distance:    Right Eye Near:   Left Eye Near:    Bilateral Near:     Physical Exam Vitals and nursing note reviewed.  Constitutional:       Appearance: Normal appearance.  Cardiovascular:     Rate and Rhythm: Normal rate and regular rhythm.     Heart sounds: Normal heart sounds.  Pulmonary:     Effort: No respiratory distress.     Breath sounds: Normal breath sounds. No wheezing, rhonchi or rales.     Comments: Patient has discomfort with palpation of the lower medial border of the rib cage.  Pain worsened with inspiration/palpation.  Chest:  Chest wall: Tenderness present.  Abdominal:     General: Bowel sounds are normal.  Skin:    General: Skin is warm and dry.  Neurological:     General: No focal deficit present.     Mental Status: She is alert and oriented to person, place, and time.  Psychiatric:        Mood and Affect: Mood normal.        Behavior: Behavior normal.        Thought Content: Thought content normal.        Judgment: Judgment normal.    UC Treatments / Results  Labs (all labs ordered are listed, but only abnormal results are displayed) Labs Reviewed - No data to display  EKG  Radiology No results found.  Procedures Procedures (including critical care time)  Medications Ordered in UC Medications - No data to display  Initial Impression / Assessment and Plan / UC Course  I have reviewed the triage vital signs and the nursing notes.  Pertinent labs & imaging results that were available during my care of the patient were reviewed by me and considered in my medical decision making (see chart for details).    Patient presenting for episodic shortness of breath and left lower chest wall discomfort on palpation/breathing.  A chest x-ray was completed out of abundance of caution due to her history.  Her physical examination was reassuring as the discomfort was reproducible.  Her vital signs are reassuring.  We talked about the use of her albuterol inhaler as needed for episodic shortness of breath.  May use Tylenol for discomfort, topical analgesia such as lidocaine patches, and warm compresses.   Patient will get in touch with her pulmonary provider and let them know what happened and to seek any further recommendations.  If we need to change her plan of care at all based upon her chest x-ray, patient is aware we will notify her.  Final Clinical Impressions(s) / UC Diagnoses   Final diagnoses:  Chest wall pain     Discharge Instructions      Your physical examination is reassuring - the discomfort is easily reproducible with palpation and movement.  However, a chest x-ray was completed out of abundance of caution and we are awaiting those results.  Should your treatment plan need to change, we will call you.  Use your Albuterol inhaler as we discussed, may use Tylenol for the discomfort, application of warm compresses and topical lidocaine patches.  Let your lung doctor know that you were here today and what your concerns are.     ED Prescriptions   None    PDMP not reviewed this encounter.   Genene Kennel, FNP 04/17/23 [phone]

## 2023-04-17 NOTE — ED Triage Notes (Signed)
 States dx with lung cancer in January. RLL with several nodules to lung on left side. Will be seeing surgeon on Tuesday. Patient is having pain to left rib cage. States pain radiates around to left side of back. Improved with Tylenol. Patient concerned something wrong with lungs on this side.  States has a chronic cough that has worsened since lung ca dx.

## 2023-04-17 NOTE — Telephone Encounter (Signed)
 Chief Complaint: SOB Symptoms: L rib pain/tenderness, mild SOB Frequency: a few days Pertinent Negatives: Patient denies fever, URI sx, CP, severe SOB Disposition: [] ED /[x] Urgent Care (no appt availability in office) / [] Appointment(In office/virtual)/ []  [facility]/ [] Home Care/ [] Refused Recommended Disposition /[] [facility]/ []  Follow-up with PCP Additional Notes: Pt c/o SOB, L rib pain/tenderness x a few days. 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 Pt reports that she is relatively compliant respiratory INH, endorsing forgetting some afternoon doses on occasion. Triager reinforced albuterol usage. Scheduled patient per protocol on 04/17/2023 at [facility] since no access at [facility]. Patient verbalized understanding and to call back with worsening symptoms.     Copied from CRM [id number]. Topic: Clinical - Red Word Triage >> Apr 17, 2023 10:04 AM Duncan Dull wrote: [facility] that prompted transfer to Nurse Triage: shortness of breath, rib on left side is hurting been experiencing since last night. Pain in rib reached to lower back, lung cancer patient. Reason for Disposition  [1] MILD difficulty breathing (e.g., minimal/no SOB at rest, SOB with walking, pulse <100) AND [2] NEW-onset or WORSE than normal  Answer Assessment - Initial Assessment Questions E2C2 Pulmonary Triage - Initial Assessment Questions "Chief Complaint (e.g., cough, sob, wheezing, fever, chills, sweat or additional symptoms) *Go to specific symptom protocol after initial questions. SOB, L rib pain/tenderness  "How long have symptoms been present?" Yesterday late afternoon - worsened overnight  Have you tested for COVID or Flu? Note: If not, ask patient if a home test can be taken. If so, instruct patient to call back for positive results. No  MEDICINES:   "Have you used any OTC meds to help with symptoms?" Yes If yes, ask "What  medications?" Tylenol with some relief Anxiety meds  "Have you used your inhalers/maintenance medication?" Yes If yes, "What medications?" Wixela - 1 puff twice a day, Albuterol PRN - has not used in 2-3 weeks  If inhaler, ask "How many puffs and how often?" Note: Review instructions on medication in the chart. See above  OXYGEN: "Do you wear supplemental oxygen?" No If yes, "How many liters are you supposed to use?" N/a  "Do you monitor your oxygen levels?" No If yes, "What is your reading (oxygen level) today?"   1. RESPIRATORY STATUS: "Describe your breathing?" (e.g., wheezing, shortness of breath, unable to speak, severe coughing)      SOB 2. ONSET: "When did this breathing problem begin?"      yesterday 3. PATTERN "Does the difficult breathing come and go, or has it been constant since it started?"      Pain has been constant SOB - intermittent, mainly when taking a deep breath 4. SEVERITY: "How bad is your breathing?" (e.g., mild, moderate, severe)    - MILD: No SOB at rest, mild SOB with walking, speaks normally in sentences, can lie down, no retractions, pulse < 100.    - MODERATE: SOB at rest, SOB with minimal exertion and prefers to sit, cannot lie down flat, speaks in phrases, mild retractions, audible wheezing, pulse 100-120.    - SEVERE: Very SOB at rest, speaks in single words, struggling to breathe, sitting hunched forward, retractions, pulse > 120      mild 5. RECURRENT SYMPTOM: "Have you had difficulty breathing before?" If Yes, ask: "When was the last time?" and "What happened that time?"      no 6. CARDIAC HISTORY: "Do you  have any history of heart disease?" (e.g., heart attack, angina, bypass surgery, angioplasty)      HTN CAD 7. LUNG HISTORY: "Do you have any history of lung disease?"  (e.g., pulmonary embolus, asthma, emphysema)     Lung cx asthma 8. CAUSE: "What do you think is causing the breathing problem?"      Unknown, positional (sits at office)  vs lung cancer 9. OTHER SYMPTOMS: "Do you have any other symptoms? (e.g., dizziness, runny nose, cough, chest pain, fever)     Chronic dry cough, some H/A r/t computer usage Denies fever, CP, dizziness  Protocols used: Breathing Difficulty-A-AH

## 2023-04-23 ENCOUNTER — Institutional Professional Consult (permissible substitution) (INDEPENDENT_AMBULATORY_CARE_PROVIDER_SITE_OTHER): Admitting: Thoracic Surgery (Cardiothoracic Vascular Surgery)

## 2023-04-23 ENCOUNTER — Encounter: Payer: Self-pay | Admitting: *Deleted

## 2023-04-23 ENCOUNTER — Encounter: Payer: Self-pay | Admitting: Thoracic Surgery (Cardiothoracic Vascular Surgery)

## 2023-04-23 ENCOUNTER — Other Ambulatory Visit: Payer: Self-pay | Admitting: *Deleted

## 2023-04-23 VITALS — BP 150/73 | HR 84 | Resp 20 | Ht 62.0 in | Wt 146.0 lb

## 2023-04-23 DIAGNOSIS — R918 Other nonspecific abnormal finding of lung field: Secondary | ICD-10-CM

## 2023-04-23 DIAGNOSIS — D3A090 Benign carcinoid tumor of the bronchus and lung: Secondary | ICD-10-CM

## 2023-04-23 DIAGNOSIS — Z5181 Encounter for therapeutic drug level monitoring: Secondary | ICD-10-CM

## 2023-04-23 NOTE — H&P (View-Only) (Signed)
 [address]       [name] [address]             [phone]     HPI: Mrs. 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  [name] is a 78 year old woman with a complicated medical history.  Her history is significant for coronary calcification on CT, hypertension, hyperlipidemia, prediabetes, hiatal hernia with repair x 2, reflux, asthma, chronic cough, melanoma, osteoarthritis, osteoporosis, chronic back and leg pain, obstructive sleep apnea, and numerous other issues.  She is a lifelong non-smoker but has had exposure to secondhand smoke.  She has been followed by Dr. Iris Mano for her chronic cough.  She has had CT evaluation with multiple small stable lung nodules.  In December she had a CT which showed an increase in size of the right middle lobe nodule.  There were numerous other scattered nodules that were unchanged.  She was very anxious about the nodule so she had a PET/CT done which did show the middle lobe nodule had increased metabolic activity.  That led to a robotic bronchoscopy.  Needle aspiration showed carcinoid tumor.  She remains extremely anxious about the diagnosis.  She is extremely frightened by the diagnosis of "lung cancer."  Continues to have a chronic cough.  No chest pain, pressure, or tightness.  Has reactions to anesthesia with severe nausea.  Past Medical History:  Diagnosis Date   Abnormal chest x-ray with multiple lung nodules 08/22/2017   Abnormal weight loss    Age-related osteoporosis without current pathological fracture 03/15/2015   Last Assessment & Plan:  Formatting of this note might be different from the original. Relevant Hx: Course: Daily Update: Today's Plan:discussed in depth with her and she is going to try the boniva when she gets back from her [place] trip, and she was advised to increase her vitamin D 3 to 3000 IU daily after review of her lab and we reviewed her DEXA last year and  she will be due for this in 2018  El   Allergy    Alopecia    Anemia    Anxiety    antianxiety med. usually at night but can take during the day for anxiety   Arthralgia of hip 10/26/2019   Arthritis    both knees & hands  also the back & neck   Asthma    Atrial fibrillation (HCC)    pt. reports that [facility] that she had a "slight afib.", no further test needed (01/07/14 cardiology note does not mention any afib, just poor anterior r wave progression)   Breast pain, right 08/29/2021   Cataract    Cervical radiculopathy 01/19/2017   Chronic cough    Chronic rhinitis 12/04/2012   Followed in Pulmonary clinic/ [facility]/ [place]   - sinus ct 10/23/2012 >>> Clear paranasal sinuses. - trial of qid 1st gen H1 02/12/2013 > not effective 05/28/13        Complication of anesthesia    Coronary artery disease involving native coronary artery of native heart without angina pectoris 06/08/2016   Formatting of this note might be different from the original. No longer sees cardiolgist.  ST okay.  Imaging with calcium   Cough variant asthma 06/18/2013   - Methacholine  challenge test 06/03/13 > equivocal but clinically caused tightness and made her cough - hfa 75% p coaching 06/17/13 > Trial of dulera 100 2bid started 06/18/2013     Degenerative  cervical disc 06/08/2016   Depression    Diaphragmatic hernia    Dizziness 12/11/2019   Elbow tendonitis    Endometriosis    Gastric polyp    Gastroenteritis    GERD (gastroesophageal reflux disease)    Hiatal hernia 11/24/2013   High risk medication use 03/15/2015   History of colonic polyps 02/09/2019   History of hiatal hernia    pt. has been told that the hiatal hernia has reappeared   History of melanoma 06/17/2017   History of pneumonia    Hypercholesteremia    Hyperlipidemia    Hypertension    pt. reports that she use to take antihtn med, but reports since weight loss she has been able to come off.     Hypothyroidism    Insomnia     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   Laryngopharyngeal reflux (LPR) 07/19/2014   Left lumbar pain 09/19/2020   Lung nodules 09/16/2017   Formatting of this note might be different from the original. Following with [facility].  Stabel.Neg PET scan   Malaise and fatigue 03/15/2015   Last Assessment & Plan:  Formatting of this note might be different from the original. 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 reports that its severe, but doesn't use it anymore, since weight loss.   Study done in [place], not sure where.    Osteoarthritis    Osteoporosis    PONV (postoperative nausea and vomiting)    Prediabetes 11/18/2019   Primary insomnia 03/15/2015   Primary osteoarthritis involving multiple joints 03/15/2015   Last Assessment & Plan:  Formatting of this note might be different from the original. 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   Restless leg syndrome 11/18/2017   Right kidney mass 05/31/2021   S/P knee replacement 01/29/2014   Seasonal allergic rhinitis due to pollen 03/17/2019   Sinus bradycardia 12/07/2019   Sinus bradycardia by electrocardiogram 12/07/2019   Sleep apnea    does not wear a CPAP   Upper  airway cough syndrome 08/04/2012   Followed in Pulmonary clinic/ [facility]/ [place]  -Kozlow eval around 2012 pos dust/ roach  Neg resp to singulair  - PFT's 02/2010 nl in effort dep portion of f/v loop, lung vol and dlcos also nl  - CT chest 05/27/12 wnl x small HH - 08/04/12   [facility] One AT >  MM - informed 09/10/2012 had not received demerol  rx on 09/02/12  - sinus ct 10/23/2012 >>> Clear paranasal sinuses. - responded to neuronti   Vitamin B 12 deficiency    Vitamin D deficiency    Past Surgical History:  Procedure Laterality Date   ABDOMINAL HYSTERECTOMY     APPENDECTOMY     BRONCHIAL  BIOPSY  03/26/2023   Procedure: BRONCHOSCOPY, WITH BIOPSY;  Surgeon: Denson Flake, MD;  Location: [facility] ENDOSCOPY;  Service: Pulmonary;;   BRONCHIAL BRUSHINGS  03/26/2023   Procedure: BRONCHOSCOPY, WITH BRUSH BIOPSY;  Surgeon: Denson Flake, MD;  Location: [facility] ENDOSCOPY;  Service: Pulmonary;;   BRONCHIAL NEEDLE ASPIRATION BIOPSY  03/26/2023   Procedure: BRONCHOSCOPY, WITH NEEDLE ASPIRATION BIOPSY;  Surgeon: Denson Flake, MD;  Location: [facility] ENDOSCOPY;  Service: Pulmonary;;   CHOLECYSTECTOMY     COLONOSCOPY  05/09/2015   Colonic Polyps, moderate sigmoid diverticulosis. Bx: Tubular Adenomas. Negative   DILATION AND CURETTAGE, DIAGNOSTIC / THERAPEUTIC     ESOPHAGEAL DILATION  08/2016   EYE SURGERY     cataracts removed / w IOL   FUDUCIAL PLACEMENT  03/26/2023   Procedure: INSERTION, FIDUCIAL MARKER, GOLD;  Surgeon: Denson Flake, MD;  Location: [facility] ENDOSCOPY;  Service: Pulmonary;;   GALLBLADDER SURGERY     HERNIA REPAIR     HIATAL HERNIA REPAIR  2/14   HIATAL HERNIA REPAIR  2014   revision of 2018 in [place]   KNEE ARTHROSCOPY     both knees for torn cartilage    NERVE REPAIR Left 07/11/2017   Procedure: Excision of saphenous nerve left knee. Right knee intra-articular injection.;  Surgeon: Claiborne Crew, MD;  Location: [facility] ORS;  Service: Orthopedics;  Laterality: Left;  60 mins   OOPHORECTOMY      TONSILLECTOMY     TOTAL KNEE ARTHROPLASTY Left 01/29/2014   Procedure: LEFT TOTAL KNEE ARTHROPLASTY MCL REPAIR;  Surgeon: Lorriane Rote, MD;  Location: [facility] OR;  Service: Orthopedics;  Laterality: Left;   VESICOVAGINAL FISTULA CLOSURE W/ TAH     VIDEO BRONCHOSCOPY WITH ENDOBRONCHIAL NAVIGATION Right 03/26/2023   Procedure: VIDEO BRONCHOSCOPY WITH ENDOBRONCHIAL NAVIGATION;  Surgeon: Denson Flake, MD;  Location: [facility] ENDOSCOPY;  Service: Pulmonary;  Laterality: Right;  WITH FLUORO WITH BIOPSY   Family History  Problem Relation Age of Onset   Emphysema Father        smoked   Heart disease Father    Arthritis Father    Asthma Mother    Colon cancer Mother    Arthritis Mother    Asthma Brother    Arthritis Brother    Breast cancer Sister    Breast cancer Maternal Grandmother    Heart disease Maternal Grandmother    Heart disease Paternal Grandmother    Heart disease Paternal Aunt     Social History   Socioeconomic History   Marital status: Married    Spouse name: Not on file   Number of children: Not on file   Years of education: Not on file   Highest education level: Not on file  Occupational History   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   Tobacco Use   Smoking status: Never    Passive exposure: Past   Smokeless tobacco: Never  Vaping Use   Vaping status: Never Used  Substance and Sexual Activity   Alcohol use: No    Comment: rare for holiday consumption only   Drug use: No   Sexual activity: Not on file  Other Topics Concern   Not on file  Social History Narrative   Lives with husband   Retired   [employer] Strain: Not on file  Food Insecurity: Low Risk  (01/01/2023)   Received from [facility]   Hunger Vital Sign    Worried  About Running Out of Food in the Last Year: Never true    Ran Out of Food in the Last Year: Never true  Transportation Needs: No Transportation Needs (01/01/2023)   Received  from [facility]    In the past 12 months, has lack of reliable transportation kept you from medical appointments, meetings, work or from getting things needed for daily living? : No  Physical Activity: Not on file  Stress: Not on file  Social Connections: Not on file  Intimate Partner Violence: Not on file     Current Outpatient Medications  Medication Sig Dispense Refill   acetaminophen  (TYLENOL ) 500 MG tablet Take 500 mg by mouth every 6 (six) hours as needed for mild pain.      albuterol  (VENTOLIN  HFA) 108 (90 Base) MCG/ACT inhaler Inhale 2 puffs into the lungs every 6 (six) hours as needed for shortness of breath or wheezing.     aspirin EC 81 MG tablet Take 81 mg by mouth daily as needed for mild pain (Chest Pain). Swallow whole.     baclofen (LIORESAL) 20 MG tablet Take 1 tablet by mouth daily.     Cholecalciferol (VITAMIN D3) 50 MCG (2000 UT) TABS Take 1 tablet by mouth daily.     famotidine  (PEPCID ) 20 MG tablet Take 1 tablet (20 mg total) by mouth at bedtime. 90 tablet 3   fluticasone  (FLONASE ) 50 MCG/ACT nasal spray Place 2 sprays into both nostrils daily.     fluticasone -salmeterol (WIXELA INHUB) 250-50 MCG/ACT AEPB Inhale 1 puff into the lungs in the morning and at bedtime. 60 each 10   Hydroxychloroquine Sulfate 300 MG TABS Take 1 tablet by mouth daily.     ibuprofen (ADVIL) 200 MG tablet Take 200 mg by mouth daily as needed for mild pain (pain score 1-3) or moderate pain (pain score 4-6).     levothyroxine (SYNTHROID, LEVOTHROID) 88 MCG tablet Take 88 mcg by mouth daily before breakfast.     LORazepam  (ATIVAN ) 0.5 MG tablet Take 0.5 mg by mouth at bedtime.  0   Melatonin 5 MG TABS Take 6 mg by mouth daily.     metoprolol  tartrate (LOPRESSOR ) 25 MG tablet Take 1 tablet (25 mg total) by mouth daily. 90 tablet 2   pantoprazole  (PROTONIX ) 20 MG tablet Take 20 mg by mouth daily.     promethazine  (PHENERGAN ) 12.5 MG tablet Take 12.5 mg by mouth every 6 (six)  hours as needed for nausea or vomiting.     promethazine -dextromethorphan  (PROMETHAZINE -DM) 6.25-15 MG/5ML syrup Take 5 mLs by mouth every 6 (six) hours.     vitamin B-12 (CYANOCOBALAMIN) 250 MCG tablet Take 250 mcg by mouth daily.     No current facility-administered medications for this visit.    Physical Exam Vitals reviewed.  Constitutional:      General: She is not in acute distress. HENT:     Head: Normocephalic and atraumatic.  Eyes:     General: No scleral icterus.    Extraocular Movements: Extraocular movements intact.  Cardiovascular:     Rate and Rhythm: Normal rate and regular rhythm.     Heart sounds: Normal heart sounds. No murmur heard.    No friction rub. No gallop.  Pulmonary:     Effort: Pulmonary effort is normal. No respiratory distress.     Breath sounds: Normal breath sounds. No wheezing or rales.  Abdominal:     General: There is no distension.     Palpations: Abdomen is  soft.  Lymphadenopathy:     Cervical: No cervical adenopathy.  Skin:    General: Skin is warm and dry.  Neurological:     General: No focal deficit present.     Mental Status: She is alert and oriented to person, place, and time.     Cranial Nerves: No cranial nerve deficit.     Motor: No weakness.    BP (!) 150/73 (BP Location: Right Arm, Patient Position: Sitting, Cuff Size: Normal)   Pulse 84   Resp 20   Ht 5\' 2"  (1.575 m)   Wt 146 lb (66.2 kg)   SpO2 96% Comment: RA  BMI 26.70 kg/m   Diagnostic Tests: NUCLEAR MEDICINE PET SKULL BASE TO THIGH   TECHNIQUE: 7.2 mCi F-18 FDG was injected intravenously. Full-ring PET imaging was performed from the skull base to thigh after the radiotracer. CT data was obtained and used for attenuation correction and anatomic localization.   Fasting blood glucose: 94 mg/dl   COMPARISON:  CT chest 12/30/2022, 08/19/2020   FINDINGS: Choose one choose one   NECK: No hypermetabolic lymph nodes in the neck.   Incidental CT findings:  None.   CHEST: Rounded nodule in the medial aspect of the RIGHT middle lobe measures 9 mm (image 84/4) and has associated metabolic activity with SUV max equal 2.7 on image 84. This small nodule touches the mediastinal pleural surface.   Similar size nodule is present on CT 08/07/2021 and 08/19/2020 and not changed in size.   No hypermetabolic mediastinal lymph nodes.   Uniform hypermetabolic activity associated with the thyroid gland.   Incidental CT findings: None.   ABDOMEN/PELVIS: No abnormal hypermetabolic activity within the liver, pancreas, adrenal glands, or spleen. No hypermetabolic lymph nodes in the abdomen or pelvis.   Incidental CT findings: None   SKELETON: No focal hypermetabolic activity to suggest skeletal metastasis.   Incidental CT findings: None.   IMPRESSION: 1. The nodule of concern in the RIGHT middle lobe does have metabolic activity; however, the nodule is stable in size from 08/19/2020 which is reassuring. Favor benign nodule. 2. No evidence of metastatic disease on FDG PET scan.     Electronically Signed   By: Deboraha Fallow M.D.   On: 03/18/2023 13:17 CT CHEST WITHOUT CONTRAST   TECHNIQUE: Multidetector CT imaging of the chest was performed using thin slice collimation for electromagnetic bronchoscopy planning purposes, without intravenous contrast.   RADIATION DOSE REDUCTION: This exam was performed according to the departmental dose-optimization program which includes automated exposure control, adjustment of the mA and/or kV according to patient size and/or use of iterative reconstruction technique.   COMPARISON:  PET of 02/28/2023. Chest CTs including back to 08/19/2020.   FINDINGS: Cardiovascular: Aortic atherosclerosis. Tortuous thoracic aorta. Normal heart size, without pericardial effusion. Three vessel coronary artery calcification.   Mediastinum/Nodes: No mediastinal or hilar adenopathy, given limitations of unenhanced  CT. Small hiatal hernia. Surgical changes at the gastroesophageal junction.   Lungs/Pleura: No pleural fluid. Mild centrilobular emphysema. Bibasilar scarring.   There are multiple (at least 10) millimetric nodules, which are primarily felt to be similar to 08/19/2020. An index lingular 4 mm nodule on 58/302 is unchanged.   The nodule of interest, in the right middle lobe, measures 7 x 8 mm on 69/302. 7 x 6 mm when remeasured on 08/19/2020. Present at 5 mm on image 73/4 of the 06/07/2017 exam.   Upper Abdomen: Cholecystectomy. Normal imaged portions of the liver, spleen, pancreas, adrenal glands, kidneys.  Musculoskeletal: Lower thoracic spondylosis.   IMPRESSION: 1. The right middle lobe nodule of interest has minimally enlarged since 2022 and has been present back to at least 2019. Strongly favored to be benign. Given slow growth over prior exams, as well as hypermetabolism on 02/28/2023 PET, this could be re-evaluated with 1 further follow-up chest CT at 1 year. 2.  No acute process in the chest. 3. Aortic atherosclerosis (ICD10-I70.0), coronary artery atherosclerosis and emphysema (ICD10-J43.9). 4. Small hiatal hernia.     Electronically Signed   By: Lore Rode M.D.   On: 03/22/2023 12:11 I personally reviewed the CT and PET/CT images.  There is aortic and coronary atherosclerosis.  Multiple tiny lung nodules.  Slowly enlarging right middle lobe nodule that does have hypermetabolic activity on PET.  No mediastinal or hilar adenopathy.  Pulmonary function testing 04/10/2023 FVC 2.36 (88%) FEV1 1.78 (89%) No change with bronchodilators DLCO uncorrected 15.57 (84%)  Impression: Alora Locklear is a 78 year old woman with a complicated medical history.  Her history is significant for coronary calcification on CT, hypertension, hyperlipidemia, prediabetes, hiatal hernia with repair x 2, reflux, asthma, chronic cough, melanoma, osteoarthritis, osteoporosis, chronic back and  leg pain, obstructive sleep apnea, and numerous other issues.  She is a lifelong non-smoker but has had exposure to secondhand smoke.  Had a slowly growing right middle lobe nodule.  Hypermetabolic on PET.  Biopsy showed carcinoid tumor.  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  We discussed that I will though carcinoid tumors are technically cancerous, many of these behave in a very benign fashion and it is not aggressive like a lung carcinoma.  I recommended we proceed with robotic assisted right middle lobe wedge resection.  I informed her of the need for general anesthesia, the incisions to be used, use of a drainage tube postoperatively, the expected hospital stay, and the overall recovery.  I informed Mrs. Claveria and her husband of the indications, risks, benefits, and alternatives.  They understand the risks include, but not limited to death, MI, DVT, PE, bleeding, possible need for transfusion, infection, prolonged air leak, cardiac arrhythmias, as well as the possibility of other unforeseeable complications.  They understand return to activities largely depends on how much pain she is having and that is unpredictable.  She understands and accepts the risks and wishes to proceed.  She does have coronary calcification on CT.  She is not having any anginal symptoms.  Had previously been cleared for back surgery by cardiology.  No interval change since then so I do not see any need for additional cardiac workup.   Plan: Robotic assisted right middle lobe wedge resection on 05/03/2023  [name], MD [facility] [phone]

## 2023-04-23 NOTE — Progress Notes (Signed)
 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  [name] is a 78 year old woman with a complicated medical history.  Her history is significant for coronary calcification on CT, hypertension, hyperlipidemia, prediabetes, hiatal hernia with repair x 2, reflux, asthma, chronic cough, melanoma, osteoarthritis, osteoporosis, chronic back and leg pain, obstructive sleep apnea, and numerous other issues.  She is a lifelong non-smoker but has had exposure to secondhand smoke.  She has been followed by Dr. Iris Mano for her chronic cough.  She has had CT evaluation with multiple small stable lung nodules.  In December she had a CT which showed an increase in size of the right middle lobe nodule.  There were numerous other scattered nodules that were unchanged.  She was very anxious about the nodule so she had a PET/CT done which did show the middle lobe nodule had increased metabolic activity.  That led to a robotic bronchoscopy.  Needle aspiration showed carcinoid tumor.  She remains extremely anxious about the diagnosis.  She is extremely frightened by the diagnosis of "lung cancer."  Continues to have a chronic cough.  No chest pain, pressure, or tightness.  Has reactions to anesthesia with severe nausea.  Past Medical History:  Diagnosis Date   Abnormal chest x-ray with multiple lung nodules 08/22/2017   Abnormal weight loss    Age-related osteoporosis without current pathological fracture 03/15/2015   Last Assessment & Plan:  Formatting of this note might be different from the original. Relevant Hx: Course: Daily Update: Today's Plan:discussed in depth with her and she is going to try the boniva when she gets back from her [place] trip, and she was advised to increase her vitamin D 3 to 3000 IU daily after review of her lab and we reviewed her DEXA last year and  she will be due for this in 2018  El   Allergy    Alopecia    Anemia    Anxiety    antianxiety med. usually at night but can take during the day for anxiety   Arthralgia of hip 10/26/2019   Arthritis    both knees & hands  also the back & neck   Asthma    Atrial fibrillation (HCC)    pt. reports that [facility] that she had a "slight afib.", no further test needed (01/07/14 cardiology note does not mention any afib, just poor anterior r wave progression)   Breast pain, right 08/29/2021   Cataract    Cervical radiculopathy 01/19/2017   Chronic cough    Chronic rhinitis 12/04/2012   Followed in Pulmonary clinic/ [facility]/ [place]   - sinus ct 10/23/2012 >>> Clear paranasal sinuses. - trial of qid 1st gen H1 02/12/2013 > not effective 05/28/13        Complication of anesthesia    Coronary artery disease involving native coronary artery of native heart without angina pectoris 06/08/2016   Formatting of this note might be different from the original. No longer sees cardiolgist.  ST okay.  Imaging with calcium   Cough variant asthma 06/18/2013   - Methacholine  challenge test 06/03/13 > equivocal but clinically caused tightness and made her cough - hfa 75% p coaching 06/17/13 > Trial of dulera 100 2bid started 06/18/2013     Degenerative  cervical disc 06/08/2016   Depression    Diaphragmatic hernia    Dizziness 12/11/2019   Elbow tendonitis    Endometriosis    Gastric polyp    Gastroenteritis    GERD (gastroesophageal reflux disease)    Hiatal hernia 11/24/2013   High risk medication use 03/15/2015   History of colonic polyps 02/09/2019   History of hiatal hernia    pt. has been told that the hiatal hernia has reappeared   History of melanoma 06/17/2017   History of pneumonia    Hypercholesteremia    Hyperlipidemia    Hypertension    pt. reports that she use to take antihtn med, but reports since weight loss she has been able to come off.     Hypothyroidism    Insomnia     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   Laryngopharyngeal reflux (LPR) 07/19/2014   Left lumbar pain 09/19/2020   Lung nodules 09/16/2017   Formatting of this note might be different from the original. Following with [facility].  Stabel.Neg PET scan   Malaise and fatigue 03/15/2015   Last Assessment & Plan:  Formatting of this note might be different from the original. 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 reports that its severe, but doesn't use it anymore, since weight loss.   Study done in [place], not sure where.    Osteoarthritis    Osteoporosis    PONV (postoperative nausea and vomiting)    Prediabetes 11/18/2019   Primary insomnia 03/15/2015   Primary osteoarthritis involving multiple joints 03/15/2015   Last Assessment & Plan:  Formatting of this note might be different from the original. 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   Restless leg syndrome 11/18/2017   Right kidney mass 05/31/2021   S/P knee replacement 01/29/2014   Seasonal allergic rhinitis due to pollen 03/17/2019   Sinus bradycardia 12/07/2019   Sinus bradycardia by electrocardiogram 12/07/2019   Sleep apnea    does not wear a CPAP   Upper  airway cough syndrome 08/04/2012   Followed in Pulmonary clinic/ [facility]/ [place]  -Kozlow eval around 2012 pos dust/ roach  Neg resp to singulair  - PFT's 02/2010 nl in effort dep portion of f/v loop, lung vol and dlcos also nl  - CT chest 05/27/12 wnl x small HH - 08/04/12   Alpha One AT >  MM - informed 09/10/2012 had not received demerol  rx on 09/02/12  - sinus ct 10/23/2012 >>> Clear paranasal sinuses. - responded to neuronti   Vitamin B 12 deficiency    Vitamin D deficiency    Past Surgical History:  Procedure Laterality Date   ABDOMINAL HYSTERECTOMY     APPENDECTOMY     BRONCHIAL  BIOPSY  03/26/2023   Procedure: BRONCHOSCOPY, WITH BIOPSY;  Surgeon: Denson Flake, MD;  Location: [facility] ENDOSCOPY;  Service: Pulmonary;;   BRONCHIAL BRUSHINGS  03/26/2023   Procedure: BRONCHOSCOPY, WITH BRUSH BIOPSY;  Surgeon: Denson Flake, MD;  Location: [facility] ENDOSCOPY;  Service: Pulmonary;;   BRONCHIAL NEEDLE ASPIRATION BIOPSY  03/26/2023   Procedure: BRONCHOSCOPY, WITH NEEDLE ASPIRATION BIOPSY;  Surgeon: Denson Flake, MD;  Location: [facility] ENDOSCOPY;  Service: Pulmonary;;   CHOLECYSTECTOMY     COLONOSCOPY  05/09/2015   Colonic Polyps, moderate sigmoid diverticulosis. Bx: Tubular Adenomas. Negative   DILATION AND CURETTAGE, DIAGNOSTIC / THERAPEUTIC     ESOPHAGEAL DILATION  08/2016   EYE SURGERY     cataracts removed / w IOL   FUDUCIAL PLACEMENT  03/26/2023   Procedure: INSERTION, FIDUCIAL MARKER, GOLD;  Surgeon: Denson Flake, MD;  Location: [facility] ENDOSCOPY;  Service: Pulmonary;;   GALLBLADDER SURGERY     HERNIA REPAIR     HIATAL HERNIA REPAIR  2/14   HIATAL HERNIA REPAIR  2014   revision of 2018 in [place]   KNEE ARTHROSCOPY     both knees for torn cartilage    NERVE REPAIR Left 07/11/2017   Procedure: Excision of saphenous nerve left knee. Right knee intra-articular injection.;  Surgeon: Claiborne Crew, MD;  Location: [facility] ORS;  Service: Orthopedics;  Laterality: Left;  60 mins   OOPHORECTOMY      TONSILLECTOMY     TOTAL KNEE ARTHROPLASTY Left 01/29/2014   Procedure: LEFT TOTAL KNEE ARTHROPLASTY MCL REPAIR;  Surgeon: Lorriane Rote, MD;  Location: [facility] OR;  Service: Orthopedics;  Laterality: Left;   VESICOVAGINAL FISTULA CLOSURE W/ TAH     VIDEO BRONCHOSCOPY WITH ENDOBRONCHIAL NAVIGATION Right 03/26/2023   Procedure: VIDEO BRONCHOSCOPY WITH ENDOBRONCHIAL NAVIGATION;  Surgeon: Denson Flake, MD;  Location[facility] ENDOSCOPY;  Service: Pulmonary;  Laterality: Right;  WITH FLUORO WITH BIOPSY   Family History  Problem Relation Age of Onset   Emphysema Father        smoked   Heart disease Father    Arthritis Father    Asthma Mother    Colon cancer Mother    Arthritis Mother    Asthma Brother    Arthritis Brother    Breast cancer Sister    Breast cancer Maternal Grandmother    Heart disease Maternal Grandmother    Heart disease Paternal Grandmother    Heart disease Paternal Aunt     Social History   Socioeconomic History   Marital status: Married    Spouse name: Not on file   Number of children: Not on file   Years of education: Not on file   Highest education level: Not on file  Occupational History   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   Tobacco Use   Smoking status: Never    Passive exposure: Past   Smokeless tobacco: Never  Vaping Use   Vaping status: Never Used  Substance and Sexual Activity   Alcohol use: No    Comment: rare for holiday consumption only   Drug use: No   Sexual activity: Not on file  Other Topics Concern   Not on file  Social History Narrative   Lives with husband   Retired   [employer] Strain: Not on file  Food Insecurity: Low Risk  (01/01/2023)   Received from [facility]   Hunger Vital Sign    Worried  About Running Out of Food in the Last Year: Never true    Ran Out of Food in the Last Year: Never true  Transportation Needs: No Transportation Needs (01/01/2023)   Received  from [facility]    In the past 12 months, has lack of reliable transportation kept you from medical appointments, meetings, work or from getting things needed for daily living? : No  Physical Activity: Not on file  Stress: Not on file  Social Connections: Not on file  Intimate Partner Violence: Not on file     Current Outpatient Medications  Medication Sig Dispense Refill   acetaminophen  (TYLENOL ) 500 MG tablet Take 500 mg by mouth every 6 (six) hours as needed for mild pain.      albuterol  (VENTOLIN  HFA) 108 (90 Base) MCG/ACT inhaler Inhale 2 puffs into the lungs every 6 (six) hours as needed for shortness of breath or wheezing.     aspirin EC 81 MG tablet Take 81 mg by mouth daily as needed for mild pain (Chest Pain). Swallow whole.     baclofen (LIORESAL) 20 MG tablet Take 1 tablet by mouth daily.     Cholecalciferol (VITAMIN D3) 50 MCG (2000 UT) TABS Take 1 tablet by mouth daily.     famotidine  (PEPCID ) 20 MG tablet Take 1 tablet (20 mg total) by mouth at bedtime. 90 tablet 3   fluticasone  (FLONASE ) 50 MCG/ACT nasal spray Place 2 sprays into both nostrils daily.     fluticasone -salmeterol (WIXELA INHUB) 250-50 MCG/ACT AEPB Inhale 1 puff into the lungs in the morning and at bedtime. 60 each 10   Hydroxychloroquine Sulfate 300 MG TABS Take 1 tablet by mouth daily.     ibuprofen (ADVIL) 200 MG tablet Take 200 mg by mouth daily as needed for mild pain (pain score 1-3) or moderate pain (pain score 4-6).     levothyroxine (SYNTHROID, LEVOTHROID) 88 MCG tablet Take 88 mcg by mouth daily before breakfast.     LORazepam (ATIVAN) 0.5 MG tablet Take 0.5 mg by mouth at bedtime.  0   Melatonin 5 MG TABS Take 6 mg by mouth daily.     metoprolol  tartrate (LOPRESSOR ) 25 MG tablet Take 1 tablet (25 mg total) by mouth daily. 90 tablet 2   pantoprazole  (PROTONIX ) 20 MG tablet Take 20 mg by mouth daily.     promethazine  (PHENERGAN ) 12.5 MG tablet Take 12.5 mg by mouth every 6 (six)  hours as needed for nausea or vomiting.     promethazine -dextromethorphan  (PROMETHAZINE -DM) 6.25-15 MG/5ML syrup Take 5 mLs by mouth every 6 (six) hours.     vitamin B-12 (CYANOCOBALAMIN) 250 MCG tablet Take 250 mcg by mouth daily.     No current facility-administered medications for this visit.    Physical Exam Vitals reviewed.  Constitutional:      General: She is not in acute distress. HENT:     Head: Normocephalic and atraumatic.  Eyes:     General: No scleral icterus.    Extraocular Movements: Extraocular movements intact.  Cardiovascular:     Rate and Rhythm: Normal rate and regular rhythm.     Heart sounds: Normal heart sounds. No murmur heard.    No friction rub. No gallop.  Pulmonary:     Effort: Pulmonary effort is normal. No respiratory distress.     Breath sounds: Normal breath sounds. No wheezing or rales.  Abdominal:     General: There is no distension.     Palpations: Abdomen is  soft.  Lymphadenopathy:     Cervical: No cervical adenopathy.  Skin:    General: Skin is warm and dry.  Neurological:     General: No focal deficit present.     Mental Status: She is alert and oriented to person, place, and time.     Cranial Nerves: No cranial nerve deficit.     Motor: No weakness.    BP (!) 150/73 (BP Location: Right Arm, Patient Position: Sitting, Cuff Size: Normal)   Pulse 84   Resp 20   Ht 5\' 2"  (1.575 m)   Wt 146 lb (66.2 kg)   SpO2 96% Comment: RA  BMI 26.70 kg/m   Diagnostic Tests: NUCLEAR MEDICINE PET SKULL BASE TO THIGH   TECHNIQUE: 7.2 mCi F-18 FDG was injected intravenously. Full-ring PET imaging was performed from the skull base to thigh after the radiotracer. CT data was obtained and used for attenuation correction and anatomic localization.   Fasting blood glucose: 94 mg/dl   COMPARISON:  CT chest 12/30/2022, 08/19/2020   FINDINGS: Choose one choose one   NECK: No hypermetabolic lymph nodes in the neck.   Incidental CT findings:  None.   CHEST: Rounded nodule in the medial aspect of the RIGHT middle lobe measures 9 mm (image 84/4) and has associated metabolic activity with SUV max equal 2.7 on image 84. This small nodule touches the mediastinal pleural surface.   Similar size nodule is present on CT 08/07/2021 and 08/19/2020 and not changed in size.   No hypermetabolic mediastinal lymph nodes.   Uniform hypermetabolic activity associated with the thyroid gland.   Incidental CT findings: None.   ABDOMEN/PELVIS: No abnormal hypermetabolic activity within the liver, pancreas, adrenal glands, or spleen. No hypermetabolic lymph nodes in the abdomen or pelvis.   Incidental CT findings: None   SKELETON: No focal hypermetabolic activity to suggest skeletal metastasis.   Incidental CT findings: None.   IMPRESSION: 1. The nodule of concern in the RIGHT middle lobe does have metabolic activity; however, the nodule is stable in size from 08/19/2020 which is reassuring. Favor benign nodule. 2. No evidence of metastatic disease on FDG PET scan.     Electronically Signed   By: Deboraha Fallow M.D.   On: 03/18/2023 13:17 CT CHEST WITHOUT CONTRAST   TECHNIQUE: Multidetector CT imaging of the chest was performed using thin slice collimation for electromagnetic bronchoscopy planning purposes, without intravenous contrast.   RADIATION DOSE REDUCTION: This exam was performed according to the departmental dose-optimization program which includes automated exposure control, adjustment of the mA and/or kV according to patient size and/or use of iterative reconstruction technique.   COMPARISON:  PET of 02/28/2023. Chest CTs including back to 08/19/2020.   FINDINGS: Cardiovascular: Aortic atherosclerosis. Tortuous thoracic aorta. Normal heart size, without pericardial effusion. Three vessel coronary artery calcification.   Mediastinum/Nodes: No mediastinal or hilar adenopathy, given limitations of unenhanced  CT. Small hiatal hernia. Surgical changes at the gastroesophageal junction.   Lungs/Pleura: No pleural fluid. Mild centrilobular emphysema. Bibasilar scarring.   There are multiple (at least 10) millimetric nodules, which are primarily felt to be similar to 08/19/2020. An index lingular 4 mm nodule on 58/302 is unchanged.   The nodule of interest, in the right middle lobe, measures 7 x 8 mm on 69/302. 7 x 6 mm when remeasured on 08/19/2020. Present at 5 mm on image 73/4 of the 06/07/2017 exam.   Upper Abdomen: Cholecystectomy. Normal imaged portions of the liver, spleen, pancreas, adrenal glands, kidneys.  Musculoskeletal: Lower thoracic spondylosis.   IMPRESSION: 1. The right middle lobe nodule of interest has minimally enlarged since 2022 and has been present back to at least 2019. Strongly favored to be benign. Given slow growth over prior exams, as well as hypermetabolism on 02/28/2023 PET, this could be re-evaluated with 1 further follow-up chest CT at 1 year. 2.  No acute process in the chest. 3. Aortic atherosclerosis (ICD10-I70.0), coronary artery atherosclerosis and emphysema (ICD10-J43.9). 4. Small hiatal hernia.     Electronically Signed   By: Lore Rode M.D.   On: 03/22/2023 12:11 I personally reviewed the CT and PET/CT images.  There is aortic and coronary atherosclerosis.  Multiple tiny lung nodules.  Slowly enlarging right middle lobe nodule that does have hypermetabolic activity on PET.  No mediastinal or hilar adenopathy.  Pulmonary function testing 04/10/2023 FVC 2.36 (88%) FEV1 1.78 (89%) No change with bronchodilators DLCO uncorrected 15.57 (84%)  Impression: [name] is a 78 year old woman with a complicated medical history.  Her history is significant for coronary calcification on CT, hypertension, hyperlipidemia, prediabetes, hiatal hernia with repair x 2, reflux, asthma, chronic cough, melanoma, osteoarthritis, osteoporosis, chronic back and  leg pain, obstructive sleep apnea, and numerous other issues.  She is a lifelong non-smoker but has had exposure to secondhand smoke.  Had a slowly growing right middle lobe nodule.  Hypermetabolic on PET.  Biopsy showed carcinoid tumor.  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  We discussed that I will though carcinoid tumors are technically cancerous, many of these behave in a very benign fashion and it is not aggressive like a lung carcinoma.  I recommended we proceed with robotic assisted right middle lobe wedge resection.  I informed her of the need for general anesthesia, the incisions to be used, use of a drainage tube postoperatively, the expected hospital stay, and the overall recovery.  I informed [name] and her husband of the indications, risks, benefits, and alternatives.  They understand the risks include, but not limited to death, MI, DVT, PE, bleeding, possible need for transfusion, infection, prolonged air leak, cardiac arrhythmias, as well as the possibility of other unforeseeable complications.  They understand return to activities largely depends on how much pain she is having and that is unpredictable.  She understands and accepts the risks and wishes to proceed.  She does have coronary calcification on CT.  She is not having any anginal symptoms.  Had previously been cleared for back surgery by cardiology.  No interval change since then so I do not see any need for additional cardiac workup.   Plan: Robotic assisted right middle lobe wedge resection on 05/03/2023  [name], MD [facility] [phone]

## 2023-04-25 ENCOUNTER — Encounter (HOSPITAL_BASED_OUTPATIENT_CLINIC_OR_DEPARTMENT_OTHER)

## 2023-04-30 NOTE — Pre-Procedure Instructions (Signed)
 Surgical Instructions   Your procedure is scheduled on Friday, May 2. Report to [facility] Main Entrance "A" at 9:45 A.M., then check in with the Admitting office. Any questions or running late day of surgery: call [phone]  Questions prior to your surgery date: call [phone], Monday-Friday, 8am-4pm. If you experience any cold or flu symptoms such as cough, fever, chills, shortness of breath, etc. between now and your scheduled surgery, please notify us  at the above number.     Remember:  Do not eat or drink after midnight the night before your surgery    Take these medicines the morning of surgery with A SIP OF WATER  baclofen (LIORESAL)  fluticasone  (FLONASE )  fluticasone -salmeterol (WIXELA INHUB)  levothyroxine (SYNTHROID, LEVOTHROID)  metoprolol  tartrate (LOPRESSOR )  pantoprazole  (PROTONIX )   May take these medicines IF NEEDED: acetaminophen  (TYLENOL ) albuterol  (VENTOLIN  HFA)  - bring inhaler with you on day of surgery dicyclomine  (BENTYL )    Follow your prescribers instructions for Hydroxychloroquine Sulfate   One week prior to surgery, STOP taking any Aspirin (unless otherwise instructed by your surgeon) Aleve, Naproxen, Ibuprofen, Motrin, Advil, Goody's, BC's, all herbal medications, fish oil, and non-prescription vitamins.                     Do NOT Smoke (Tobacco/Vaping) for 24 hours prior to your procedure.  If you use a CPAP at night, you may bring your mask/headgear for your overnight stay.   You will be asked to remove any contacts, glasses, piercing's, hearing aid's, dentures/partials prior to surgery. Please bring cases for these items if needed.    Patients discharged the day of surgery will not be allowed to drive home, and someone needs to stay with them for 24 hours.  SURGICAL WAITING ROOM VISITATION Patients may have no more than 2 support people in the waiting area - these visitors may rotate.   Pre-op nurse will coordinate an appropriate time  for 1 ADULT support person, who may not rotate, to accompany patient in pre-op.  Children under the age of 18 must have an adult with them who is not the patient and must remain in the main waiting area with an adult.  If the patient needs to stay at the hospital during part of their recovery, the visitor guidelines for inpatient rooms apply.  Please refer to the [facility] website for the visitor guidelines for any additional information.   If you received a COVID test during your pre-op visit  it is requested that you wear a mask when out in public, stay away from anyone that may not be feeling well and notify your surgeon if you develop symptoms. If you have been in contact with anyone that has tested positive in the last 10 days please notify you surgeon.      Pre-operative CHG Bathing Instructions   You can play a key role in reducing the risk of infection after surgery. Your skin needs to be as free of germs as possible. You can reduce the number of germs on your skin by washing with CHG (chlorhexidine  gluconate) soap before surgery. CHG is an antiseptic soap that kills germs and continues to kill germs even after washing.   DO NOT use if you have an allergy to chlorhexidine /CHG or antibacterial soaps. If your skin becomes reddened or irritated, stop using the CHG and notify one of our RNs at [phone].              TAKE A SHOWER  THE NIGHT BEFORE SURGERY AND THE DAY OF SURGERY    Please keep in mind the following:  DO NOT shave, including legs and underarms, 48 hours prior to surgery.   You may shave your face before/day of surgery.  Place clean sheets on your bed the night before surgery Use a clean washcloth (not used since being washed) for each shower. DO NOT sleep with pet's night before surgery.  CHG Shower Instructions:  Wash your face and private area with normal soap. If you choose to wash your hair, wash first with your normal shampoo.  After you use shampoo/soap,  rinse your hair and body thoroughly to remove shampoo/soap residue.  Turn the water OFF and apply half the bottle of CHG soap to a CLEAN washcloth.  Apply CHG soap ONLY FROM YOUR NECK DOWN TO YOUR TOES (washing for 3-5 minutes)  DO NOT use CHG soap on face, private areas, open wounds, or sores.  Pay special attention to the area where your surgery is being performed.  If you are having back surgery, having someone wash your back for you may be helpful. Wait 2 minutes after CHG soap is applied, then you may rinse off the CHG soap.  Pat dry with a clean towel  Put on clean pajamas    Additional instructions for the day of surgery: DO NOT APPLY any lotions, deodorants, cologne, or perfumes.   Do not wear jewelry or makeup Do not wear nail polish, gel polish, artificial nails, or any other type of covering on natural nails (fingers and toes) Do not bring valuables to the hospital. [facility] is not responsible for valuables/personal belongings. Put on clean/comfortable clothes.  Please brush your teeth.  Ask your nurse before applying any prescription medications to the skin.

## 2023-05-01 ENCOUNTER — Encounter (HOSPITAL_COMMUNITY): Payer: Self-pay

## 2023-05-01 ENCOUNTER — Encounter (HOSPITAL_COMMUNITY)
Admission: RE | Admit: 2023-05-01 | Discharge: 2023-05-01 | Disposition: A | Discharge status: Home / Self Care | Source: Ambulatory Visit | Attending: Thoracic Surgery (Cardiothoracic Vascular Surgery) | Admitting: Thoracic Surgery (Cardiothoracic Vascular Surgery)

## 2023-05-01 ENCOUNTER — Ambulatory Visit (HOSPITAL_COMMUNITY)
Admission: RE | Admit: 2023-05-01 | Discharge: 2023-05-01 | Disposition: A | Discharge status: Home / Self Care | Source: Ambulatory Visit | Attending: Thoracic Surgery (Cardiothoracic Vascular Surgery) | Admitting: Thoracic Surgery (Cardiothoracic Vascular Surgery)

## 2023-05-01 ENCOUNTER — Other Ambulatory Visit: Payer: Self-pay

## 2023-05-01 VITALS — BP 139/61 | HR 64 | Temp 97.8°F | Resp 17 | Ht 62.0 in | Wt 148.0 lb

## 2023-05-01 DIAGNOSIS — K219 Gastro-esophageal reflux disease without esophagitis: Secondary | ICD-10-CM | POA: Insufficient documentation

## 2023-05-01 DIAGNOSIS — Z01818 Encounter for other preprocedural examination: Secondary | ICD-10-CM | POA: Insufficient documentation

## 2023-05-01 DIAGNOSIS — D3A090 Benign carcinoid tumor of the bronchus and lung: Secondary | ICD-10-CM

## 2023-05-01 DIAGNOSIS — R918 Other nonspecific abnormal finding of lung field: Secondary | ICD-10-CM | POA: Insufficient documentation

## 2023-05-01 DIAGNOSIS — Z5181 Encounter for therapeutic drug level monitoring: Secondary | ICD-10-CM

## 2023-05-01 DIAGNOSIS — Z79899 Other long term (current) drug therapy: Secondary | ICD-10-CM | POA: Insufficient documentation

## 2023-05-01 HISTORY — DX: Headache, unspecified: R51.9

## 2023-05-01 LAB — CBC
HCT: 41.2 % (ref 36.0–46.0)
Hemoglobin: 13.2 g/dL (ref 12.0–15.0)
MCH: 30.2 pg (ref 26.0–34.0)
MCHC: 32 g/dL (ref 30.0–36.0)
MCV: 94.3 fL (ref 80.0–100.0)
Platelets: 274 10*3/uL (ref 150–400)
RBC: 4.37 MIL/uL (ref 3.87–5.11)
RDW: 12.9 % (ref 11.5–15.5)
WBC: 6.8 10*3/uL (ref 4.0–10.5)
nRBC: 0 % (ref 0.0–0.2)

## 2023-05-01 LAB — COMPREHENSIVE METABOLIC PANEL WITH GFR
ALT: 17 U/L (ref 0–44)
AST: 26 U/L (ref 15–41)
Albumin: 3.6 g/dL (ref 3.5–5.0)
Alkaline Phosphatase: 53 U/L (ref 38–126)
Anion gap: 9 (ref 5–15)
BUN: 12 mg/dL (ref 8–23)
CO2: 24 mmol/L (ref 22–32)
Calcium: 9.1 mg/dL (ref 8.9–10.3)
Chloride: 110 mmol/L (ref 98–111)
Creatinine, Ser: 1 mg/dL (ref 0.44–1.00)
GFR, Estimated: 58 mL/min — ABNORMAL LOW (ref >60)
Glucose, Bld: 96 mg/dL (ref 70–99)
Potassium: 4.3 mmol/L (ref 3.5–5.1)
Sodium: 143 mmol/L (ref 135–145)
Total Bilirubin: 0.6 mg/dL (ref 0.0–1.2)
Total Protein: 6.2 g/dL — ABNORMAL LOW (ref 6.5–8.1)

## 2023-05-01 LAB — URINALYSIS, ROUTINE W REFLEX MICROSCOPIC
Bacteria, UA: NONE SEEN
Bilirubin Urine: NEGATIVE
Glucose, UA: NEGATIVE mg/dL
Hgb urine dipstick: NEGATIVE
Ketones, ur: NEGATIVE mg/dL
Nitrite: NEGATIVE
Protein, ur: NEGATIVE mg/dL
Specific Gravity, Urine: 1.025 (ref 1.005–1.030)
pH: 5 (ref 5.0–8.0)

## 2023-05-01 LAB — PROTIME-INR
INR: 1.1 (ref 0.8–1.2)
Prothrombin Time: 14.3 s (ref 11.4–15.2)

## 2023-05-01 LAB — SURGICAL PCR SCREEN
MRSA, PCR: NEGATIVE
Staphylococcus aureus: NEGATIVE

## 2023-05-01 LAB — TYPE AND SCREEN
ABO/RH(D): O POS
Antibody Screen: NEGATIVE

## 2023-05-01 LAB — APTT: aPTT: 33 s (ref 24–36)

## 2023-05-01 NOTE — Progress Notes (Signed)
 PCP - Dr. Authur Leghorn Cardiologist - Dr. Ralene Burger  PPM/ICD - denies   Chest x-ray - 05/01/23 EKG - 05/01/23 Stress Test - 06/14/16 ECHO - 01/24/21 Cardiac Cath - denies  Sleep Study - OSA+ CPAP - denies, pt states she never needed one  DM- denies  Last dose of GLP1 agonist-  n/a   ASA/Blood Thinner Instructions: n/a   ERAS Protcol - no, NPO    COVID TEST- n/a   Anesthesia review: yes, cardiac hx  Patient denies shortness of breath, fever, cough and chest pain at PAT appointment   All instructions explained to the patient, with a verbal understanding of the material. Patient agrees to go over the instructions while at home for a better understanding. The opportunity to ask questions was provided.

## 2023-05-02 NOTE — Anesthesia Preprocedure Evaluation (Addendum)
 Anesthesia Evaluation  Patient identified by MRN, date of birth, ID band Patient awake    Reviewed: Allergy & Precautions, NPO status , Patient's Chart, lab work & pertinent test results  History of Anesthesia Complications (+) PONV and history of anesthetic complications  Airway Mallampati: II  TM Distance: >3 FB Neck ROM: Full    Dental no notable dental hx.    Pulmonary sleep apnea  Malignant carcinoid tumor of lung   Pulmonary exam normal breath sounds clear to auscultation       Cardiovascular hypertension, Pt. on medications + CAD  Normal cardiovascular exam Rhythm:Regular Rate:Normal     Neuro/Psych  Headaches PSYCHIATRIC DISORDERS Anxiety Depression     Neuromuscular disease    GI/Hepatic Neg liver ROS, hiatal hernia,GERD  ,,  Endo/Other  negative endocrine ROSHypothyroidism    Renal/GU Renal disease  negative genitourinary   Musculoskeletal  (+) Arthritis , Osteoarthritis,    Abdominal   Peds negative pediatric ROS (+)  Hematology  (+) Blood dyscrasia, anemia   Anesthesia Other Findings   Reproductive/Obstetrics negative OB ROS                              Anesthesia Physical Anesthesia Plan  ASA: 3  Anesthesia Plan: General   Post-op Pain Management: Tylenol  PO (pre-op)*   Induction: Intravenous  PONV Risk Score and Plan: 4 or greater and Ondansetron , Dexamethasone , Midazolam  and Treatment may vary due to age or medical condition  Airway Management Planned: Double Lumen EBT, Fiberoptic Intubation Planned and Video Laryngoscope Planned  Additional Equipment: Arterial line  Intra-op Plan:   Post-operative Plan: Extubation in OR  Informed Consent: I have reviewed the patients History and Physical, chart, labs and discussed the procedure including the risks, benefits and alternatives for the proposed anesthesia with the patient or authorized representative who has  indicated his/her understanding and acceptance.     Dental advisory given  Plan Discussed with: CRNA  Anesthesia Plan Comments: (PAT note by Rudy Costain, PA-C:  78 year old female follows with pulmonology for history of pulmonary nodules, asthma, chronic cough, LPR, GERD (on PPI and H2 blocker), hiatal hernia s/p reapir, chronic postnasal drip.  Maintained on Wixela inhaler and as needed albuterol . Lifelong non smoker.  Recent PET scan showed mild SUV of 2.7 in 9 mm right middle lobe pulmonary nodule.  Had bronch with biopsy on 03/26/23 which showed carcinoid tumor. Resection recommended.    Pulmonary function testing 04/10/2023 FVC 2.36 (88%) FEV1 1.78 (89%) No change with bronchodilators DLCO uncorrected 15.57 (84%)  Follows cardiology for history of coronary calcification noted on CT, bradycardia, HTN, SVT.  Event monitor 02/05/2021 showed average heart rate was 76 bpm, predominant rhythm was sinus, 3 than 7 2 episodes of SVT--started on low-dose beta-blocker. Echo 01/24/2021 showed EF 55 to 60%, no valvular abnormalities.  She was last seen by Pattricia Bores, NP on 12/21/2022 for preop evaluation.  Per note, "According to the Revised Cardiac Risk Index (RCRI), her Perioperative Risk of Major Cardiac Event is (%): 0.4 Her Functional Capacity in METs is: 4.3 according to the [facility] Activity Status Index (DASI). Therefore, based on ACC/AHA guidelines, patient would be at acceptable risk for the planned procedure without further cardiovascular testing. I will route this recommendation to the requesting party via Epic fax function.  Regarding her aspirin therapy, she can hold this 7 days prior to her surgery and resumed once directed by the surgeon."  Surgical clearance was  initially for lumbar spine surgery that was ultimately postponed due to the discovery of enlarging pulmonary nodules.   Other pertinent history includes postoperative nausea and vomiting, hypothyroid.   Proep labs reviewed, unremarkable.     EKG 05/01/23: Normal sinus rhythm. Rate 67. Low voltage QRS   CT super D Chest 03/22/23: IMPRESSION: 1. The right middle lobe nodule of interest has minimally enlarged since 2022 and has been present back to at least 2019. Strongly favored to be benign. Given slow growth over prior exams, as well as hypermetabolism on 02/28/2023 PET, this could be re-evaluated with 1 further follow-up chest CT at 1 year. 2.  No acute process in the chest. 3. Aortic atherosclerosis (ICD10-I70.0), coronary artery atherosclerosis and emphysema (ICD10-J43.9). 4. Small hiatal hernia.   TTE 01/24/21: 1. Left ventricular ejection fraction, by estimation, is 55 to 60%. The  left ventricle has normal function. The left ventricle has no regional  wall motion abnormalities. Left ventricular diastolic parameters were  normal.   2. Right ventricular systolic function is normal. The right ventricular  size is normal.   3. The mitral valve is normal in structure. No evidence of mitral valve  regurgitation. No evidence of mitral stenosis.   4. The aortic valve is normal in structure. Aortic valve regurgitation is  not visualized. No aortic stenosis is present.   5. The inferior vena cava is normal in size with greater than 50%  respiratory variability, suggesting right atrial pressure of 3 mmHg.  )         Anesthesia Quick Evaluation

## 2023-05-02 NOTE — Progress Notes (Signed)
 Anesthesia Chart Review:  78 year old female follows with pulmonology for history of pulmonary nodules, asthma, chronic cough, LPR, GERD (on PPI and H2 blocker), hiatal hernia s/p reapir, chronic postnasal drip.  Maintained on Wixela inhaler and as needed albuterol . Lifelong non smoker.  Recent PET scan showed mild SUV of 2.7 in 9 mm right middle lobe pulmonary nodule.  Had bronch with biopsy on 03/26/23 which showed carcinoid tumor. Resection recommended.    Pulmonary function testing 04/10/2023 FVC 2.36 (88%) FEV1 1.78 (89%) No change with bronchodilators DLCO uncorrected 15.57 (84%)  Follows cardiology for history of coronary calcification noted on CT, bradycardia, HTN, SVT.  Event monitor 02/05/2021 showed average heart rate was 76 bpm, predominant rhythm was sinus, 3 than 7 2 episodes of SVT--started on low-dose beta-blocker. Echo 01/24/2021 showed EF 55 to 60%, no valvular abnormalities.  She was last seen by Pattricia Bores, NP on 12/21/2022 for preop evaluation.  Per note, "According to the Revised Cardiac Risk Index (RCRI), her Perioperative Risk of Major Cardiac Event is (%): 0.4 Her Functional Capacity in METs is: 4.3 according to the [facility] Activity Status Index (DASI). Therefore, based on ACC/AHA guidelines, patient would be at acceptable risk for the planned procedure without further cardiovascular testing. I will route this recommendation to the requesting party via Epic fax function.  Regarding her aspirin therapy, she can hold this 7 days prior to her surgery and resumed once directed by the surgeon."  Surgical clearance was initially for lumbar spine surgery that was ultimately postponed due to the discovery of enlarging pulmonary nodules.   Other pertinent history includes postoperative nausea and vomiting, hypothyroid.   Proep labs reviewed, unremarkable.    EKG 05/01/23: Normal sinus rhythm. Rate 67. Low voltage QRS   CT super D Chest 03/22/23: IMPRESSION: 1. The right middle lobe nodule of  interest has minimally enlarged since 2022 and has been present back to at least 2019. Strongly favored to be benign. Given slow growth over prior exams, as well as hypermetabolism on 02/28/2023 PET, this could be re-evaluated with 1 further follow-up chest CT at 1 year. 2.  No acute process in the chest. 3. Aortic atherosclerosis (ICD10-I70.0), coronary artery atherosclerosis and emphysema (ICD10-J43.9). 4. Small hiatal hernia.   TTE 01/24/21: 1. Left ventricular ejection fraction, by estimation, is 55 to 60%. The  left ventricle has normal function. The left ventricle has no regional  wall motion abnormalities. Left ventricular diastolic parameters were  normal.   2. Right ventricular systolic function is normal. The right ventricular  size is normal.   3. The mitral valve is normal in structure. No evidence of mitral valve  regurgitation. No evidence of mitral stenosis.   4. The aortic valve is normal in structure. Aortic valve regurgitation is  not visualized. No aortic stenosis is present.   5. The inferior vena cava is normal in size with greater than 50%  respiratory variability, suggesting right atrial pressure of 3 mmHg.   Edilia Gordon [facility] Short Stay Center/Anesthesiology Phone [phone] 05/02/2023 8:20 AM

## 2023-05-03 ENCOUNTER — Other Ambulatory Visit: Payer: Self-pay

## 2023-05-03 ENCOUNTER — Encounter (HOSPITAL_COMMUNITY): Payer: Self-pay | Admitting: Thoracic Surgery (Cardiothoracic Vascular Surgery)

## 2023-05-03 ENCOUNTER — Inpatient Hospital Stay (HOSPITAL_COMMUNITY): Payer: Self-pay | Admitting: Physician Assistant

## 2023-05-03 ENCOUNTER — Encounter (HOSPITAL_COMMUNITY)
Admission: RE | Disposition: A | Discharge status: Home / Self Care | Payer: Self-pay | Source: Home / Self Care | Attending: Thoracic Surgery (Cardiothoracic Vascular Surgery)

## 2023-05-03 ENCOUNTER — Inpatient Hospital Stay (HOSPITAL_COMMUNITY)

## 2023-05-03 ENCOUNTER — Inpatient Hospital Stay (HOSPITAL_COMMUNITY)
Admission: RE | Admit: 2023-05-03 | Discharge: 2023-05-05 | DRG: 165 | Disposition: A | Discharge status: Home / Self Care | Attending: Thoracic Surgery (Cardiothoracic Vascular Surgery) | Admitting: Thoracic Surgery (Cardiothoracic Vascular Surgery)

## 2023-05-03 DIAGNOSIS — I2510 Atherosclerotic heart disease of native coronary artery without angina pectoris: Secondary | ICD-10-CM | POA: Diagnosis present

## 2023-05-03 DIAGNOSIS — F32A Depression, unspecified: Secondary | ICD-10-CM | POA: Diagnosis present

## 2023-05-03 DIAGNOSIS — D3A00 Benign carcinoid tumor of unspecified site: Principal | ICD-10-CM | POA: Diagnosis present

## 2023-05-03 DIAGNOSIS — Z882 Allergy status to sulfonamides status: Secondary | ICD-10-CM

## 2023-05-03 DIAGNOSIS — C342 Malignant neoplasm of middle lobe, bronchus or lung: Secondary | ICD-10-CM | POA: Diagnosis not present

## 2023-05-03 DIAGNOSIS — F419 Anxiety disorder, unspecified: Secondary | ICD-10-CM | POA: Diagnosis present

## 2023-05-03 DIAGNOSIS — M159 Polyosteoarthritis, unspecified: Secondary | ICD-10-CM | POA: Diagnosis present

## 2023-05-03 DIAGNOSIS — Z96652 Presence of left artificial knee joint: Secondary | ICD-10-CM | POA: Diagnosis present

## 2023-05-03 DIAGNOSIS — G2581 Restless legs syndrome: Secondary | ICD-10-CM | POA: Diagnosis present

## 2023-05-03 DIAGNOSIS — I10 Essential (primary) hypertension: Secondary | ICD-10-CM | POA: Diagnosis present

## 2023-05-03 DIAGNOSIS — Z7982 Long term (current) use of aspirin: Secondary | ICD-10-CM

## 2023-05-03 DIAGNOSIS — I4891 Unspecified atrial fibrillation: Secondary | ICD-10-CM | POA: Diagnosis present

## 2023-05-03 DIAGNOSIS — Z79899 Other long term (current) drug therapy: Secondary | ICD-10-CM | POA: Diagnosis not present

## 2023-05-03 DIAGNOSIS — Z825 Family history of asthma and other chronic lower respiratory diseases: Secondary | ICD-10-CM

## 2023-05-03 DIAGNOSIS — K5900 Constipation, unspecified: Secondary | ICD-10-CM | POA: Diagnosis present

## 2023-05-03 DIAGNOSIS — E782 Mixed hyperlipidemia: Secondary | ICD-10-CM | POA: Diagnosis present

## 2023-05-03 DIAGNOSIS — R7303 Prediabetes: Secondary | ICD-10-CM | POA: Diagnosis present

## 2023-05-03 DIAGNOSIS — G8929 Other chronic pain: Secondary | ICD-10-CM | POA: Diagnosis present

## 2023-05-03 DIAGNOSIS — M810 Age-related osteoporosis without current pathological fracture: Secondary | ICD-10-CM | POA: Diagnosis present

## 2023-05-03 DIAGNOSIS — E039 Hypothyroidism, unspecified: Secondary | ICD-10-CM | POA: Diagnosis present

## 2023-05-03 DIAGNOSIS — R918 Other nonspecific abnormal finding of lung field: Secondary | ICD-10-CM

## 2023-05-03 DIAGNOSIS — Z91041 Radiographic dye allergy status: Secondary | ICD-10-CM

## 2023-05-03 DIAGNOSIS — Z860101 Personal history of adenomatous and serrated colon polyps: Secondary | ICD-10-CM

## 2023-05-03 DIAGNOSIS — M549 Dorsalgia, unspecified: Secondary | ICD-10-CM | POA: Diagnosis present

## 2023-05-03 DIAGNOSIS — Z8249 Family history of ischemic heart disease and other diseases of the circulatory system: Secondary | ICD-10-CM | POA: Diagnosis not present

## 2023-05-03 DIAGNOSIS — J45909 Unspecified asthma, uncomplicated: Secondary | ICD-10-CM | POA: Diagnosis present

## 2023-05-03 DIAGNOSIS — Z7951 Long term (current) use of inhaled steroids: Secondary | ICD-10-CM

## 2023-05-03 DIAGNOSIS — K219 Gastro-esophageal reflux disease without esophagitis: Secondary | ICD-10-CM | POA: Diagnosis present

## 2023-05-03 DIAGNOSIS — Z7722 Contact with and (suspected) exposure to environmental tobacco smoke (acute) (chronic): Secondary | ICD-10-CM | POA: Diagnosis present

## 2023-05-03 DIAGNOSIS — Z885 Allergy status to narcotic agent status: Secondary | ICD-10-CM

## 2023-05-03 DIAGNOSIS — Z79890 Hormone replacement therapy (postmenopausal): Secondary | ICD-10-CM

## 2023-05-03 DIAGNOSIS — Z902 Acquired absence of lung [part of]: Secondary | ICD-10-CM

## 2023-05-03 DIAGNOSIS — Z888 Allergy status to other drugs, medicaments and biological substances status: Secondary | ICD-10-CM

## 2023-05-03 DIAGNOSIS — G4733 Obstructive sleep apnea (adult) (pediatric): Secondary | ICD-10-CM | POA: Diagnosis present

## 2023-05-03 DIAGNOSIS — Z8261 Family history of arthritis: Secondary | ICD-10-CM

## 2023-05-03 DIAGNOSIS — D3A090 Benign carcinoid tumor of the bronchus and lung: Principal | ICD-10-CM | POA: Diagnosis present

## 2023-05-03 DIAGNOSIS — Z803 Family history of malignant neoplasm of breast: Secondary | ICD-10-CM

## 2023-05-03 DIAGNOSIS — Z85820 Personal history of malignant melanoma of skin: Secondary | ICD-10-CM

## 2023-05-03 DIAGNOSIS — Z884 Allergy status to anesthetic agent status: Secondary | ICD-10-CM

## 2023-05-03 DIAGNOSIS — Z91012 Allergy to eggs: Secondary | ICD-10-CM

## 2023-05-03 DIAGNOSIS — C7A090 Malignant carcinoid tumor of the bronchus and lung: Secondary | ICD-10-CM | POA: Diagnosis present

## 2023-05-03 DIAGNOSIS — Z800 Family history of malignant neoplasm of digestive organs: Secondary | ICD-10-CM

## 2023-05-03 DIAGNOSIS — Z881 Allergy status to other antibiotic agents status: Secondary | ICD-10-CM

## 2023-05-03 HISTORY — PX: WEDGE RESECTION, LUNG, ROBOT-ASSISTED, THORACOSCOPIC: SHX7655

## 2023-05-03 HISTORY — PX: INTERCOSTAL NERVE BLOCK: SHX5021

## 2023-05-03 HISTORY — PX: LYMPH NODE BIOPSY: SHX201

## 2023-05-03 LAB — GLUCOSE, CAPILLARY
Glucose-Capillary: 123 mg/dL — ABNORMAL HIGH (ref 70–99)
Glucose-Capillary: 127 mg/dL — ABNORMAL HIGH (ref 70–99)

## 2023-05-03 SURGERY — WEDGE RESECTION, LUNG, ROBOT-ASSISTED, THORACOSCOPIC
Anesthesia: General | Site: Chest | Laterality: Right

## 2023-05-03 MED ORDER — BUPIVACAINE LIPOSOME 1.3 % IJ SUSP
INTRAMUSCULAR | Status: DC | PRN
  Administered 2023-05-03: 100 mL

## 2023-05-03 MED ORDER — MELATONIN 5 MG PO TABS
5.0000 mg | ORAL_TABLET | Freq: Every day | ORAL | Status: DC
  Administered 2023-05-03 – 2023-05-04 (×2): 5 mg via ORAL
  Filled 2023-05-03 (×2): qty 1

## 2023-05-03 MED ORDER — SODIUM CHLORIDE 0.9 % IV SOLN
INTRAVENOUS | Status: AC | PRN
  Administered 2023-05-03: 1000 mL

## 2023-05-03 MED ORDER — FENTANYL CITRATE (PF) 250 MCG/5ML IJ SOLN
INTRAMUSCULAR | Status: AC
  Filled 2023-05-03: qty 5

## 2023-05-03 MED ORDER — INSULIN ASPART 100 UNIT/ML IJ SOLN
0.0000 [IU] | Freq: Three times a day (TID) | INTRAMUSCULAR | Status: DC
  Administered 2023-05-03 (×2): 2 [IU] via SUBCUTANEOUS

## 2023-05-03 MED ORDER — BUPIVACAINE LIPOSOME 1.3 % IJ SUSP
INTRAMUSCULAR | Status: AC
  Filled 2023-05-03: qty 20

## 2023-05-03 MED ORDER — LUNG SURGERY BOOK
Freq: Once | Status: AC
  Filled 2023-05-03: qty 1

## 2023-05-03 MED ORDER — OXYCODONE HCL 5 MG PO TABS: 5.0000 mg | ORAL_TABLET | Freq: Four times a day (QID) | ORAL | Status: DC | PRN

## 2023-05-03 MED ORDER — ACETAMINOPHEN 500 MG PO TABS
1000.0000 mg | ORAL_TABLET | Freq: Once | ORAL | Status: AC
  Administered 2023-05-03: 1000 mg via ORAL
  Filled 2023-05-03: qty 2

## 2023-05-03 MED ORDER — ENOXAPARIN SODIUM 40 MG/0.4ML IJ SOSY
40.0000 mg | PREFILLED_SYRINGE | Freq: Every day | INTRAMUSCULAR | Status: DC
  Administered 2023-05-04 – 2023-05-05 (×2): 40 mg via SUBCUTANEOUS
  Filled 2023-05-03 (×2): qty 0.4

## 2023-05-03 MED ORDER — LACTATED RINGERS IV SOLN: INTRAVENOUS | Status: DC | PRN

## 2023-05-03 MED ORDER — BACLOFEN 10 MG PO TABS
20.0000 mg | ORAL_TABLET | Freq: Every day | ORAL | Status: DC
  Administered 2023-05-04 – 2023-05-05 (×2): 20 mg via ORAL
  Filled 2023-05-03 (×2): qty 2

## 2023-05-03 MED ORDER — PANTOPRAZOLE SODIUM 40 MG PO TBEC
40.0000 mg | DELAYED_RELEASE_TABLET | Freq: Every day | ORAL | Status: DC
  Administered 2023-05-04 – 2023-05-05 (×2): 40 mg via ORAL
  Filled 2023-05-03 (×2): qty 1

## 2023-05-03 MED ORDER — SODIUM CHLORIDE 0.9 % IV SOLN: INTRAVENOUS | Status: DC

## 2023-05-03 MED ORDER — ORAL CARE MOUTH RINSE: 15.0000 mL | Freq: Once | OROMUCOSAL | Status: AC

## 2023-05-03 MED ORDER — ROCURONIUM BROMIDE 10 MG/ML (PF) SYRINGE
PREFILLED_SYRINGE | INTRAVENOUS | Status: AC
  Filled 2023-05-03: qty 10

## 2023-05-03 MED ORDER — BISACODYL 5 MG PO TBEC
10.0000 mg | DELAYED_RELEASE_TABLET | Freq: Every day | ORAL | Status: DC
  Administered 2023-05-03 – 2023-05-04 (×2): 10 mg via ORAL
  Filled 2023-05-03 (×3): qty 2

## 2023-05-03 MED ORDER — VANCOMYCIN HCL IN DEXTROSE 1-5 GM/200ML-% IV SOLN
1000.0000 mg | INTRAVENOUS | Status: AC
  Administered 2023-05-03: 1000 mg via INTRAVENOUS
  Filled 2023-05-03: qty 200

## 2023-05-03 MED ORDER — ACETAMINOPHEN 160 MG/5ML PO SOLN: 1000.0000 mg | Freq: Four times a day (QID) | ORAL | Status: DC

## 2023-05-03 MED ORDER — MIDAZOLAM HCL 2 MG/2ML IJ SOLN
INTRAMUSCULAR | Status: DC | PRN
  Administered 2023-05-03: 2 mg via INTRAVENOUS

## 2023-05-03 MED ORDER — METOPROLOL TARTRATE 25 MG PO TABS
25.0000 mg | ORAL_TABLET | Freq: Every day | ORAL | Status: DC
  Administered 2023-05-04 – 2023-05-05 (×2): 25 mg via ORAL
  Filled 2023-05-03 (×2): qty 1

## 2023-05-03 MED ORDER — DROPERIDOL 2.5 MG/ML IJ SOLN
0.6250 mg | Freq: Once | INTRAMUSCULAR | Status: AC | PRN
  Administered 2023-05-03: 0.625 mg via INTRAVENOUS

## 2023-05-03 MED ORDER — LORAZEPAM 1 MG PO TABS
1.0000 mg | ORAL_TABLET | Freq: Every day | ORAL | Status: DC
  Administered 2023-05-03 – 2023-05-04 (×2): 1 mg via ORAL
  Filled 2023-05-03 (×2): qty 1

## 2023-05-03 MED ORDER — FENTANYL CITRATE (PF) 250 MCG/5ML IJ SOLN
INTRAMUSCULAR | Status: DC | PRN
  Administered 2023-05-03: 150 ug via INTRAVENOUS

## 2023-05-03 MED ORDER — GLYCOPYRROLATE 0.2 MG/ML IJ SOLN
INTRAMUSCULAR | Status: DC | PRN
  Administered 2023-05-03: .2 mg via INTRAVENOUS

## 2023-05-03 MED ORDER — PROPOFOL 10 MG/ML IV BOLUS
INTRAVENOUS | Status: DC | PRN
  Administered 2023-05-03 (×2): 100 mg via INTRAVENOUS

## 2023-05-03 MED ORDER — PHENYLEPHRINE HCL-NACL 20-0.9 MG/250ML-% IV SOLN
INTRAVENOUS | Status: DC | PRN
  Administered 2023-05-03: 50 ug/min via INTRAVENOUS

## 2023-05-03 MED ORDER — HYDROXYCHLOROQUINE SULFATE 200 MG PO TABS
200.0000 mg | ORAL_TABLET | Freq: Every day | ORAL | Status: DC
  Administered 2023-05-04 – 2023-05-05 (×2): 200 mg via ORAL
  Filled 2023-05-03 (×2): qty 1

## 2023-05-03 MED ORDER — DEXAMETHASONE SODIUM PHOSPHATE 10 MG/ML IJ SOLN
INTRAMUSCULAR | Status: DC | PRN
  Administered 2023-05-03: 10 mg via INTRAVENOUS

## 2023-05-03 MED ORDER — LORATADINE 10 MG PO TABS
10.0000 mg | ORAL_TABLET | Freq: Every day | ORAL | Status: DC
  Administered 2023-05-03 – 2023-05-05 (×3): 10 mg via ORAL
  Filled 2023-05-03 (×3): qty 1

## 2023-05-03 MED ORDER — FLUTICASONE FUROATE-VILANTEROL 200-25 MCG/ACT IN AEPB
1.0000 | INHALATION_SPRAY | Freq: Every day | RESPIRATORY_TRACT | Status: DC
  Filled 2023-05-03 (×2): qty 28

## 2023-05-03 MED ORDER — LEVOTHYROXINE SODIUM 88 MCG PO TABS
88.0000 ug | ORAL_TABLET | Freq: Every day | ORAL | Status: DC
  Administered 2023-05-04 – 2023-05-05 (×2): 88 ug via ORAL
  Filled 2023-05-03 (×2): qty 1

## 2023-05-03 MED ORDER — FENTANYL CITRATE PF 50 MCG/ML IJ SOSY
25.0000 ug | PREFILLED_SYRINGE | INTRAMUSCULAR | Status: DC | PRN
  Administered 2023-05-04 (×2): 25 ug via INTRAVENOUS
  Filled 2023-05-03 (×2): qty 1

## 2023-05-03 MED ORDER — EPHEDRINE SULFATE-NACL 50-0.9 MG/10ML-% IV SOSY
PREFILLED_SYRINGE | INTRAVENOUS | Status: DC | PRN
  Administered 2023-05-03: 5 mg via INTRAVENOUS

## 2023-05-03 MED ORDER — CHLORHEXIDINE GLUCONATE 0.12 % MT SOLN
OROMUCOSAL | Status: AC
  Administered 2023-05-03: 15 mL via OROMUCOSAL
  Filled 2023-05-03: qty 15

## 2023-05-03 MED ORDER — LACTATED RINGERS IV SOLN: INTRAVENOUS | Status: DC

## 2023-05-03 MED ORDER — PHENYLEPHRINE 80 MCG/ML (10ML) SYRINGE FOR IV PUSH (FOR BLOOD PRESSURE SUPPORT)
PREFILLED_SYRINGE | INTRAVENOUS | Status: DC | PRN
  Administered 2023-05-03: 80 ug via INTRAVENOUS

## 2023-05-03 MED ORDER — INSULIN ASPART 100 UNIT/ML IJ SOLN
0.0000 [IU] | Freq: Three times a day (TID) | INTRAMUSCULAR | Status: DC
Start: 2023-05-03 — End: 2023-05-03

## 2023-05-03 MED ORDER — TRAMADOL HCL 50 MG PO TABS: 50.0000 mg | ORAL_TABLET | Freq: Four times a day (QID) | ORAL | Status: DC | PRN

## 2023-05-03 MED ORDER — MIDAZOLAM HCL 2 MG/2ML IJ SOLN
INTRAMUSCULAR | Status: AC
  Filled 2023-05-03: qty 2

## 2023-05-03 MED ORDER — 0.9 % SODIUM CHLORIDE (POUR BTL) OPTIME
TOPICAL | Status: DC | PRN
  Administered 2023-05-03: 1000 mL

## 2023-05-03 MED ORDER — FENTANYL CITRATE (PF) 100 MCG/2ML IJ SOLN
INTRAMUSCULAR | Status: AC
  Filled 2023-05-03: qty 2

## 2023-05-03 MED ORDER — BUPIVACAINE HCL (PF) 0.5 % IJ SOLN
INTRAMUSCULAR | Status: AC
Start: 2023-05-03 — End: ?
  Filled 2023-05-03: qty 30

## 2023-05-03 MED ORDER — ONDANSETRON HCL 4 MG/2ML IJ SOLN
4.0000 mg | Freq: Four times a day (QID) | INTRAMUSCULAR | Status: DC | PRN
  Administered 2023-05-03: 4 mg via INTRAVENOUS
  Filled 2023-05-03: qty 2

## 2023-05-03 MED ORDER — ACETAMINOPHEN 10 MG/ML IV SOLN: 1000.0000 mg | Freq: Once | INTRAVENOUS | Status: DC | PRN

## 2023-05-03 MED ORDER — ALBUTEROL (5 MG/ML) CONTINUOUS INHALATION SOLN: 2.5000 mg | INHALATION_SOLUTION | Freq: Four times a day (QID) | RESPIRATORY_TRACT | Status: DC | PRN

## 2023-05-03 MED ORDER — ROCURONIUM BROMIDE 10 MG/ML (PF) SYRINGE
PREFILLED_SYRINGE | INTRAVENOUS | Status: DC | PRN
  Administered 2023-05-03: 60 mg via INTRAVENOUS
  Administered 2023-05-03: 20 mg via INTRAVENOUS

## 2023-05-03 MED ORDER — PROPOFOL 10 MG/ML IV BOLUS
INTRAVENOUS | Status: AC
  Filled 2023-05-03: qty 20

## 2023-05-03 MED ORDER — DICYCLOMINE HCL 10 MG PO CAPS: 10.0000 mg | ORAL_CAPSULE | Freq: Every day | ORAL | Status: DC | PRN

## 2023-05-03 MED ORDER — ORAL CARE MOUTH RINSE: 15.0000 mL | OROMUCOSAL | Status: DC | PRN

## 2023-05-03 MED ORDER — SENNOSIDES-DOCUSATE SODIUM 8.6-50 MG PO TABS
1.0000 | ORAL_TABLET | Freq: Every day | ORAL | Status: DC
  Administered 2023-05-03: 1 via ORAL
  Filled 2023-05-03: qty 1

## 2023-05-03 MED ORDER — KETOROLAC TROMETHAMINE 15 MG/ML IJ SOLN
15.0000 mg | Freq: Four times a day (QID) | INTRAMUSCULAR | Status: DC
  Administered 2023-05-03 – 2023-05-05 (×7): 15 mg via INTRAVENOUS
  Filled 2023-05-03 (×7): qty 1

## 2023-05-03 MED ORDER — VANCOMYCIN HCL IN DEXTROSE 1-5 GM/200ML-% IV SOLN
1000.0000 mg | Freq: Two times a day (BID) | INTRAVENOUS | Status: AC
  Administered 2023-05-03: 1000 mg via INTRAVENOUS
  Filled 2023-05-03: qty 200

## 2023-05-03 MED ORDER — DROPERIDOL 2.5 MG/ML IJ SOLN
INTRAMUSCULAR | Status: AC
  Filled 2023-05-03: qty 2

## 2023-05-03 MED ORDER — FLUTICASONE PROPIONATE 50 MCG/ACT NA SUSP
2.0000 | Freq: Every day | NASAL | Status: DC
  Administered 2023-05-04 – 2023-05-05 (×2): 2 via NASAL
  Filled 2023-05-03: qty 16

## 2023-05-03 MED ORDER — FENTANYL CITRATE (PF) 100 MCG/2ML IJ SOLN
25.0000 ug | INTRAMUSCULAR | Status: DC | PRN
Start: 2023-05-03 — End: 2023-05-03
  Administered 2023-05-03 (×2): 25 ug via INTRAVENOUS
  Administered 2023-05-03: 50 ug via INTRAVENOUS

## 2023-05-03 MED ORDER — ONDANSETRON HCL 4 MG/2ML IJ SOLN
INTRAMUSCULAR | Status: DC | PRN
  Administered 2023-05-03: 4 mg via INTRAVENOUS

## 2023-05-03 MED ORDER — LIDOCAINE 2% (20 MG/ML) 5 ML SYRINGE
INTRAMUSCULAR | Status: AC
  Filled 2023-05-03: qty 5

## 2023-05-03 MED ORDER — OXYCODONE HCL 5 MG/5ML PO SOLN: 5.0000 mg | Freq: Once | ORAL | Status: DC | PRN

## 2023-05-03 MED ORDER — CHLORHEXIDINE GLUCONATE 0.12 % MT SOLN: 15.0000 mL | Freq: Once | OROMUCOSAL | Status: AC

## 2023-05-03 MED ORDER — SUGAMMADEX SODIUM 200 MG/2ML IV SOLN
INTRAVENOUS | Status: DC | PRN
  Administered 2023-05-03: 200 mg via INTRAVENOUS

## 2023-05-03 MED ORDER — ACETAMINOPHEN 500 MG PO TABS
1000.0000 mg | ORAL_TABLET | Freq: Four times a day (QID) | ORAL | Status: DC
  Administered 2023-05-03 – 2023-05-05 (×7): 1000 mg via ORAL
  Filled 2023-05-03 (×7): qty 2

## 2023-05-03 MED ORDER — LIDOCAINE 2% (20 MG/ML) 5 ML SYRINGE
INTRAMUSCULAR | Status: DC | PRN
  Administered 2023-05-03: 100 mg via INTRAVENOUS

## 2023-05-03 MED ORDER — OXYCODONE HCL 5 MG PO TABS: 5.0000 mg | ORAL_TABLET | Freq: Once | ORAL | Status: DC | PRN

## 2023-05-03 SURGICAL SUPPLY — 62 items
BLADE CLIPPER SURG (BLADE) ×1 IMPLANT
CANISTER SUCT 3000ML PPV (MISCELLANEOUS) ×2 IMPLANT
CANNULA REDUCER 12-8 DVNC XI (CANNULA) ×2 IMPLANT
CATH THOR STR 28F SOFT WA (CATHETERS) IMPLANT
CNTNR URN SCR LID CUP LEK RST (MISCELLANEOUS) ×5 IMPLANT
CONN ST 1/4X3/8 BEN (MISCELLANEOUS) IMPLANT
DEFOGGER SCOPE WARM SEASHARP (MISCELLANEOUS) ×1 IMPLANT
DERMABOND ADVANCED .7 DNX12 (GAUZE/BANDAGES/DRESSINGS) ×1 IMPLANT
DRAPE ARM DVNC X/XI (DISPOSABLE) ×4 IMPLANT
DRAPE COLUMN DVNC XI (DISPOSABLE) ×1 IMPLANT
DRAPE CV SPLIT W-CLR ANES SCRN (DRAPES) ×1 IMPLANT
DRAPE HALF SHEET 40X57 (DRAPES) ×1 IMPLANT
DRAPE SURG ORHT 6 SPLT 77X108 (DRAPES) ×1 IMPLANT
ELECT BLADE 6.5 EXT (BLADE) IMPLANT
ELECTRODE REM PT RTRN 9FT ADLT (ELECTROSURGICAL) ×1 IMPLANT
FORCEPS BPLR FENES DVNC XI (FORCEP) IMPLANT
FORCEPS BPLR LNG DVNC XI (INSTRUMENTS) IMPLANT
GAUZE KITTNER 4X5 RF (MISCELLANEOUS) ×2 IMPLANT
GAUZE SPONGE 4X4 12PLY STRL (GAUZE/BANDAGES/DRESSINGS) ×1 IMPLANT
GLOVE SS BIOGEL STRL SZ 7.5 (GLOVE) ×1 IMPLANT
GOWN STRL REUS W/ TWL LRG LVL3 (GOWN DISPOSABLE) ×2 IMPLANT
GOWN STRL REUS W/ TWL XL LVL3 (GOWN DISPOSABLE) ×2 IMPLANT
GOWN STRL REUS W/TWL 2XL LVL3 (GOWN DISPOSABLE) ×1 IMPLANT
GRASPER TIP-UP FEN DVNC XI (INSTRUMENTS) IMPLANT
HEMOSTAT SURGICEL 2X14 (HEMOSTASIS) ×3 IMPLANT
IRRIGATION STRYKERFLOW (MISCELLANEOUS) ×1 IMPLANT
KIT BASIN OR (CUSTOM PROCEDURE TRAY) ×1 IMPLANT
KIT SUCTION CATH 14FR (SUCTIONS) IMPLANT
KIT TURNOVER KIT B (KITS) ×1 IMPLANT
NDL HYPO 25GX1X1/2 BEV (NEEDLE) ×1 IMPLANT
NDL SPNL 22GX3.5 QUINCKE BK (NEEDLE) ×1 IMPLANT
NEEDLE HYPO 25GX1X1/2 BEV (NEEDLE) ×1 IMPLANT
NEEDLE SPNL 22GX3.5 QUINCKE BK (NEEDLE) ×1 IMPLANT
NS IRRIG 1000ML POUR BTL (IV SOLUTION) ×1 IMPLANT
PACK CHEST (CUSTOM PROCEDURE TRAY) ×1 IMPLANT
PAD ARMBOARD POSITIONER FOAM (MISCELLANEOUS) ×2 IMPLANT
PORT ACCESS TROCAR AIRSEAL 12 (TROCAR) ×1 IMPLANT
RELOAD STAPLE 45 3.5 BLU DVNC (STAPLE) IMPLANT
RELOAD STAPLE 45 4.3 GRN DVNC (STAPLE) IMPLANT
RELOAD STAPLER 3.5X45 BLU DVNC (STAPLE) ×5 IMPLANT
RELOAD STAPLER 4.3X45 GRN DVNC (STAPLE) ×1 IMPLANT
SEAL UNIV 5-12 XI (MISCELLANEOUS) ×4 IMPLANT
SET TRI-LUMEN FLTR TB AIRSEAL (TUBING) ×1 IMPLANT
SOLUTION ELECTROSURG ANTI STCK (MISCELLANEOUS) ×1 IMPLANT
SPONGE TONSIL 1 RF SGL (DISPOSABLE) IMPLANT
STAPLER 45 SUREFORM DVNC (STAPLE) IMPLANT
SUT SILK 1 MH (SUTURE) ×2 IMPLANT
SUT SILK 2 0 SH (SUTURE) IMPLANT
SUT VIC AB 1-0 CT2 27 (SUTURE) IMPLANT
SUT VIC AB 2-0 CTX 36 (SUTURE) IMPLANT
SUT VIC AB 3-0 X1 27 (SUTURE) ×2 IMPLANT
SUT VICRYL 0 TIES 12 18 (SUTURE) ×1 IMPLANT
SUT VICRYL 0 UR6 27IN ABS (SUTURE) ×2 IMPLANT
SYR 20ML LL LF (SYRINGE) ×2 IMPLANT
SYSTEM RETRIEVAL ANCHOR 8 (MISCELLANEOUS) IMPLANT
SYSTEM SAHARA CHEST DRAIN ATS (WOUND CARE) ×1 IMPLANT
TAPE CLOTH 4X10 WHT NS (GAUZE/BANDAGES/DRESSINGS) ×1 IMPLANT
TAPE CLOTH SURG 4X10 WHT LF (GAUZE/BANDAGES/DRESSINGS) IMPLANT
TIP APPLICATOR SPRAY EXTEND 16 (VASCULAR PRODUCTS) IMPLANT
TOWEL GREEN STERILE (TOWEL DISPOSABLE) ×2 IMPLANT
TRAY FOLEY MTR SLVR 16FR STAT (SET/KITS/TRAYS/PACK) ×1 IMPLANT
WATER STERILE IRR 1000ML POUR (IV SOLUTION) ×1 IMPLANT

## 2023-05-03 NOTE — Anesthesia Procedure Notes (Signed)
 Procedure Name: Intubation Date/Time: 05/03/2023 12:17 PM  Performed by: Viki Graver, CRNAPre-anesthesia Checklist: Patient identified, Emergency Drugs available, Suction available and Patient being monitored Patient Re-evaluated:Patient Re-evaluated prior to induction Oxygen Delivery Method: Circle System Utilized Preoxygenation: Pre-oxygenation with 100% oxygen Induction Type: IV induction Ventilation: Mask ventilation without difficulty Laryngoscope Size: Mac and 4 Grade View: Grade II Tube type: Oral Endobronchial tube: Left, Double lumen EBT and EBT position confirmed by fiberoptic bronchoscope and 35 Fr Number of attempts: 1 Airway Equipment and Method: Stylet, Oral airway and Fiberoptic brochoscope Placement Confirmation: ETT inserted through vocal cords under direct vision, positive ETCO2 and breath sounds checked- equal and bilateral Tube secured with: Tape Dental Injury: Teeth and Oropharynx as per pre-operative assessment

## 2023-05-03 NOTE — Discharge Summary (Incomplete)
 Physician Discharge Summary  Patient ID: [name] MRN: [id number] DOB/AGE: 08-05-1945 78 y.o.  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 Iris Sherman  PCP: [name]., MD   History of Present Illness: [name] is a 78 year old woman with a complicated medical history.  Her history is significant for coronary calcification on CT, hypertension, hyperlipidemia, prediabetes, hiatal hernia with repair x 2, reflux, asthma, chronic cough, melanoma, osteoarthritis, osteoporosis, chronic back and leg pain, obstructive sleep apnea, and numerous other issues.  She is a lifelong non-smoker but has had exposure to secondhand smoke.  Had a slowly growing right middle lobe nodule.  Hypermetabolic on PET.  Biopsy showed carcinoid tumor.   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  We discussed that I will though carcinoid tumors are technically cancerous, many of these behave in a very benign fashion and it is not aggressive like a lung carcinoma.   I recommended we proceed with robotic assisted right middle lobe wedge resection.  I informed her of the need for general anesthesia, the incisions to be used, use of a drainage tube postoperatively, the expected hospital stay, and the overall recovery.  I informed [name] and her husband of the  indications, risks, benefits, and alternatives.  They understand the risks include, but not limited to death, MI, DVT, PE, bleeding, possible need for transfusion, infection, prolonged air leak, cardiac arrhythmias, as well as the possibility of other unforeseeable complications.  They understand return to activities largely depends on how much pain she is having and that is unpredictable.   She understands and accepts the risks and wishes to proceed.   She does have coronary calcification on CT.  She is not having any anginal symptoms.  Had previously been cleared for back surgery by cardiology.  No interval change since then so I do not see any need for additional cardiac workup.   Plan robotic assisted right middle lobe wedge resection on 05/03/2023.  Hospital Course: [name] was admitted for elective surgery on 05/03/2023.  She was taken the operating where robotic assisted right middle lobe wedge resection was carried out along with lymph node sampling.  Following the procedure, she was extubated and recovered in the postanesthesia care unit.  She was later transitioned to [facility].  The patient's chest tube did not have an air leak on water seal.  The chest xray was free from pneumothorax.  Her chest tube had serous drainage leaking around the chest tube and required placement of an additional suture.  Her chest tube was removed on 05/04/2023.  Follow up chest xray showed no pneumothorax.  She was ambulating without difficulty.  Her incision has no signs of infection.  She is medically stable for discharge home today.     Consults: None  Significant Diagnostic Studies: nuclear medicine:   FINDINGS: Choose one choose one   NECK: No hypermetabolic lymph nodes in the neck.   Incidental CT findings: None.   CHEST: Rounded nodule in the medial aspect of the  RIGHT middle lobe measures 9 mm (image 84/4) and has associated metabolic activity with SUV max equal 2.7 on image 84. This  small nodule touches the mediastinal pleural surface.   Similar size nodule is present on CT 08/07/2021 and 08/19/2020 and not changed in size.   No hypermetabolic mediastinal lymph nodes.   Uniform hypermetabolic activity associated with the thyroid gland.   Incidental CT findings: None.   ABDOMEN/PELVIS: No abnormal hypermetabolic activity within the liver, pancreas, adrenal glands, or spleen. No hypermetabolic lymph nodes in the abdomen or pelvis.   Incidental CT findings: None   SKELETON: No focal hypermetabolic activity to suggest skeletal metastasis.   Incidental CT findings: None.   IMPRESSION: 1. The nodule of concern in the RIGHT middle lobe does have metabolic activity; however, the nodule is stable in size from 08/19/2020 which is reassuring. Favor benign nodule. 2. No evidence of metastatic disease on FDG PET scan.     Electronically Signed   By: [name] M.D.   On: 03/18/2023 13:17  Treatments: surgery:   NAME: [name] MEDICAL RECORD NO: [id number] ACCOUNT NO: [id number] DATE OF BIRTH: 1945-12-12 FACILITY: [facility] LOCATION: [facility] PHYSICIAN: Theresa Aloe. Luna Salinas, MD   Operative Report    DATE OF PROCEDURE: 05/03/2023   PREOPERATIVE DIAGNOSIS:  Right middle lobe carcinoid tumor, clinical stage IA (T1, N0).   POSTOPERATIVE DIAGNOSIS:  Right middle lobe carcinoid tumor, clinical stage IA (T1, N0).   PROCEDURES PERFORMED:  Xi robotic-assisted right middle lobe wedge resection, lymph node dissection, and intercostal nerve blocks levels 3 through 10.   SURGEON:  Theresa Aloe. Luna Salinas, MD.   ASSISTANT:  Theresa Bolognese, PA.  Discharge Exam: Blood pressure (!) 111/49, pulse 68, temperature 98.1 F (36.7 C), temperature source Oral, resp. rate (!) 24, height 5\' 2"  (1.575 m), weight 71.2 kg, SpO2 95%.  General appearance: alert, cooperative, and no distress Heart: regular rate and rhythm Lungs: clear to auscultation bilaterally Abdomen:  soft, non-tender; bowel sounds normal; no masses,  no organomegaly Extremities: extremities normal, atraumatic, no cyanosis or edema Wound: clean, mild skin edge separation from port site.  Discharge disposition: 01-Home or Self Care   Allergies as of 05/05/2023       Reactions   Iodinated Contrast Media Hives, Rash   Neurontin  [gabapentin ] Other (See Comments)   Severe falls,dizziness, forgetful    Ciprofloxacin Diarrhea   Codeine  Nausea Only   Egg-derived Products    GI upset per patient   Misc. Sulfonamide Containing Compounds Other (See Comments)   Omnicef [cefdinir] Other (See Comments)   Unknown reaction.   Other Nausea And Vomiting   General anesthesia    Oxycodone Nausea And Vomiting   Tramadol     REACTION: GI discomfort with high doses   Cyclobenzaprine Palpitations   Sulfa Antibiotics Rash        Medication List     TAKE these medications    acetaminophen  500 MG tablet Commonly known as: TYLENOL  Take 1,000 mg by mouth every 6 (six) hours as needed for mild pain (pain score 1-3).   baclofen 20 MG tablet Commonly known as: LIORESAL Take 20 mg by mouth daily.   cetirizine 10 MG tablet Commonly known as: ZYRTEC Take 10 mg by mouth daily.   dicyclomine  10 MG capsule Commonly known as: BENTYL  Take 10 mg by mouth daily as needed (bloating).   famotidine  20 MG tablet Commonly known as: PEPCID  Take 1 tablet (20 mg total) by mouth at bedtime.   fluticasone  50 MCG/ACT  nasal spray Commonly known as: FLONASE  Place 2 sprays into both nostrils daily.   fluticasone -salmeterol 250-50 MCG/ACT Aepb Commonly known as: Wixela Inhub Inhale 1 puff into the lungs in the morning and at bedtime.   Hydroxychloroquine Sulfate 300 MG Tabs Take 300 mg by mouth daily.   levothyroxine 88 MCG tablet Commonly known as: SYNTHROID Take 88 mcg by mouth daily before breakfast.   LORazepam  1 MG tablet Commonly known as: ATIVAN  Take 1 mg by mouth at bedtime.   melatonin 5  MG Tabs Take 5 mg by mouth at bedtime.   metoprolol  tartrate 25 MG tablet Commonly known as: LOPRESSOR  Take 1 tablet (25 mg total) by mouth daily.   ondansetron  4 MG tablet Commonly known as: Zofran  Take 1 tablet (4 mg total) by mouth every 8 (eight) hours as needed for nausea or vomiting.   pantoprazole  20 MG tablet Commonly known as: PROTONIX  Take 20 mg by mouth daily.   traMADol  50 MG tablet Commonly known as: ULTRAM  Take 1 tablet (50 mg total) by mouth every 6 (six) hours as needed (mild pain). Take with food   Ventolin  HFA 108 (90 Base) MCG/ACT inhaler Generic drug: albuterol  Inhale 2 puffs into the lungs every 6 (six) hours as needed for shortness of breath or wheezing.   vitamin B-12 500 MCG tablet Commonly known as: CYANOCOBALAMIN Take 500 mcg by mouth daily.   Vitamin D3 125 MCG (5000 UT) Tabs Take 5,000 Units by mouth daily.        Follow-up Information     [facility] of [facility] [place]. [employer]. Go on 06/04/2023.   Specialty: Cardiothoracic Surgery Why: Your appointment with Dr. Luna Sherman is at 1:45pm. Please arrive about 45 minutes early for a chest x-ray to be performed in the radiology department of the same building., If symptoms worsen Contact information: [address] [place]  [id number] [phone]               Signed:  Gates Kasal, PA-C 05/05/2023, 1:31 PM

## 2023-05-03 NOTE — Interval H&P Note (Signed)
 History and Physical Interval Note:  05/03/2023 11:13 AM  [name]  has presented today for surgery, with the diagnosis of RIGHT MIDDLE LOBE CARCINOID TUMOR.  The various methods of treatment have been discussed with the patient and family. After consideration of risks, benefits and other options for treatment, the patient has consented to  Procedure(s) with comments: WEDGE RESECTION, LUNG, ROBOT-ASSISTED, THORACOSCOPIC (Right) - RIGHT MIDDLE LOBE WEDGE RESECTION as a surgical intervention.  The patient's history has been reviewed, patient examined, no change in status, stable for surgery.  I have reviewed the patient's chart and labs.  Questions were answered to the patient's satisfaction.     [name]

## 2023-05-03 NOTE — Progress Notes (Signed)
 Anesthesiologist pulled Brachial line. Pressure held for 15-20 minutes.

## 2023-05-03 NOTE — Transfer of Care (Signed)
 Immediate Anesthesia Transfer of Care Note  Patient: [name]  Procedure(s) Performed: WEDGE RESECTION, LUNG, ROBOT-ASSISTED, THORACOSCOPIC (Right: Chest) BLOCK, NERVE, INTERCOSTAL (Right: Chest) LYMPH NODE BIOPSY (Right: Chest)  Patient Location: PACU  Anesthesia Type:General  Level of Consciousness: drowsy  Airway & Oxygen Therapy: Patient Spontanous Breathing  Post-op Assessment: Report given to RN and Post -op Vital signs reviewed and stable  Post vital signs: Reviewed and stable  Last Vitals:  Vitals Value Taken Time  BP 158/76 05/03/23 1431  Temp    Pulse 58 05/03/23 1434  Resp 19 05/03/23 1434  SpO2 97 % 05/03/23 1434  Vitals shown include unfiled device data.  Last Pain:  Vitals:   05/03/23 0944  TempSrc: Oral         Complications: No notable events documented.

## 2023-05-03 NOTE — Anesthesia Procedure Notes (Signed)
 Arterial Line Insertion Start/End5/02/2023 11:00 AM, 05/03/2023 11:10 AM Performed by: Lethaniel Rave, MD, anesthesiologist  Patient location: Pre-op. Preanesthetic checklist: patient identified, IV checked, site marked, risks and benefits discussed, surgical consent, monitors and equipment checked, pre-op evaluation, timeout performed and anesthesia consent Lidocaine  1% used for infiltration Left, brachial was placed Catheter size: 20 G Hand hygiene performed  and maximum sterile barriers used   Attempts: 1 Procedure performed using ultrasound guided technique. Ultrasound Notes:anatomy identified, needle tip was noted to be adjacent to the nerve/plexus identified, no ultrasound evidence of intravascular and/or intraneural injection and image(s) printed for medical record Following insertion, dressing applied. Post procedure assessment: normal and unchanged  Patient tolerated the procedure well with no immediate complications.

## 2023-05-03 NOTE — Hospital Course (Addendum)
 Referring Physician:  Dr. Iris Mano  PCP: Abbe Hoard., MD   History of Present Illness: [name] is a 78 year old woman with a complicated medical history.  Her history is significant for coronary calcification on CT, hypertension, hyperlipidemia, prediabetes, hiatal hernia with repair x 2, reflux, asthma, chronic cough, melanoma, osteoarthritis, osteoporosis, chronic back and leg pain, obstructive sleep apnea, and numerous other issues.  She is a lifelong non-smoker but has had exposure to secondhand smoke.  Had a slowly growing right middle lobe nodule.  Hypermetabolic on PET.  Biopsy showed carcinoid tumor.   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  We discussed that I will though carcinoid tumors are technically cancerous, many of these behave in a very benign fashion and it is not aggressive like a lung carcinoma.   I recommended we proceed with robotic assisted right middle lobe wedge resection.  I informed her of the need for general anesthesia, the incisions to be used, use of a drainage tube postoperatively, the expected hospital stay, and the overall recovery.  I informed Mrs. Verni and her husband of the indications, risks, benefits, and alternatives.  They understand the risks include, but not limited to death, MI, DVT, PE, bleeding, possible need for transfusion, infection, prolonged air leak, cardiac arrhythmias, as well as the possibility of other unforeseeable complications.  They understand return to activities largely depends on how much pain she is having and that is unpredictable.   She understands and accepts the risks and wishes to proceed.   She does have coronary calcification on CT.  She is not having any anginal symptoms.  Had previously been cleared for back surgery by cardiology.  No interval change since then so I do not see any need for additional cardiac workup.   Plan robotic assisted right  middle lobe wedge resection on 05/03/2023.  Hospital Course: Mr. Revoir was admitted for elective surgery on 05/03/2023.  She was taken the operating where robotic assisted right middle lobe wedge resection was carried out along with lymph node sampling.  Following the procedure, she was extubated and recovered in the postanesthesia care unit.  She was later transitioned to [facility].

## 2023-05-03 NOTE — TOC CM/SW Note (Signed)
 Transition of Care [facility]) - Inpatient Brief Assessment   Patient Details  Name: Quillie Muratori MRN: [id number] Date of Birth: [birth year]  [facility]) CM/SW Contact:    Juliane Och, LCSW Phone Number: 05/03/2023, 4:51 PM   Clinical Narrative:  4:51 PM Per chart review, patient resides with spouse. Patient has insurance and a PCP. Patient has HH ([facility])/DME history but not SNF history. No TOC needs were identified at this time. TOC will continue to follow and be available to assist.  Transition of Care Asessment: Insurance and Status: Insurance coverage has been reviewed Patient has primary care physician: Yes Home environment has been reviewed: Private Residence Prior level of function:: N/A Prior/Current Home Services: No current home services (Has HH/DME history) Social Drivers of Health Review: SDOH reviewed no interventions necessary Readmission risk has been reviewed: Yes Transition of care needs: no transition of care needs at this time

## 2023-05-03 NOTE — Brief Op Note (Addendum)
 05/03/2023  2:20 PM  PATIENT:  [name]  78 y.o. 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,N0)  PROCEDURE:   ROBOTIC-ASSISTED RIGHT MIDDLE LOBE WEDGE RESECTION LYMPH NODE DISSECTION(Right) BLOCK, NERVE, INTERCOSTAL (Right)   SURGEON:   [name], MD   PHYSICIAN ASSISTANT:  Parris Bolognese, PA  ASSISTANTS: Nickie Barnes, RN, Scrub Person    ANESTHESIA:   general  EBL:  50ml  BLOOD ADMINISTERED:none  DRAINS:  Right pleural 35fr chest tube x 1    LOCAL MEDICATIONS USED:  Exparel   SPECIMEN:  Right middle lobe wedge, multiple lymph nodes  DISPOSITION OF SPECIMEN:  PATHOLOGY  COUNTS:  Correct  DICTATION: .Dragon Dictation  PLAN OF CARE: Admit to inpatient   PATIENT DISPOSITION:  PACU - hemodynamically stable.   Delay start of Pharmacological VTE agent (>24hrs) due to surgical blood loss or risk of bleeding: yes

## 2023-05-03 NOTE — Discharge Instructions (Signed)

## 2023-05-04 ENCOUNTER — Inpatient Hospital Stay (HOSPITAL_COMMUNITY)

## 2023-05-04 LAB — BASIC METABOLIC PANEL WITH GFR
Anion gap: 8 (ref 5–15)
BUN: 11 mg/dL (ref 8–23)
CO2: 21 mmol/L — ABNORMAL LOW (ref 22–32)
Calcium: 8.3 mg/dL — ABNORMAL LOW (ref 8.9–10.3)
Chloride: 106 mmol/L (ref 98–111)
Creatinine, Ser: 0.68 mg/dL (ref 0.44–1.00)
GFR, Estimated: >60 mL/min (ref >60)
Glucose, Bld: 118 mg/dL — ABNORMAL HIGH (ref 70–99)
Potassium: 3.7 mmol/L (ref 3.5–5.1)
Sodium: 135 mmol/L (ref 135–145)

## 2023-05-04 LAB — GLUCOSE, CAPILLARY
Glucose-Capillary: 118 mg/dL — ABNORMAL HIGH (ref 70–99)
Glucose-Capillary: 95 mg/dL (ref 70–99)
Glucose-Capillary: 106 mg/dL — ABNORMAL HIGH (ref 70–99)
Glucose-Capillary: 111 mg/dL — ABNORMAL HIGH (ref 70–99)

## 2023-05-04 LAB — CBC
HCT: 35.7 % — ABNORMAL LOW (ref 36.0–46.0)
Hemoglobin: 11.7 g/dL — ABNORMAL LOW (ref 12.0–15.0)
MCH: 30.2 pg (ref 26.0–34.0)
MCHC: 32.8 g/dL (ref 30.0–36.0)
MCV: 92 fL (ref 80.0–100.0)
Platelets: 226 10*3/uL (ref 150–400)
RBC: 3.88 MIL/uL (ref 3.87–5.11)
RDW: 12.8 % (ref 11.5–15.5)
WBC: 14.8 10*3/uL — ABNORMAL HIGH (ref 4.0–10.5)
nRBC: 0 % (ref 0.0–0.2)

## 2023-05-04 MED ORDER — LACTULOSE 10 GM/15ML PO SOLN
20.0000 g | Freq: Once | ORAL | Status: AC
  Administered 2023-05-04: 20 g via ORAL
  Filled 2023-05-04: qty 30

## 2023-05-04 MED ORDER — POLYETHYLENE GLYCOL 3350 17 G PO PACK
17.0000 g | PACK | Freq: Every day | ORAL | Status: DC
  Filled 2023-05-04: qty 1

## 2023-05-04 NOTE — Op Note (Addendum)
 NAMEALTAMESE, DEGUIRE MEDICAL RECORD NO: [id number] ACCOUNT NO: [id number] DATE OF BIRTH: 10-24-1945 FACILITY: [facility] LOCATION: [facility] PHYSICIAN: Theresa Aloe. Luna Salinas, MD  Operative Report   DATE OF PROCEDURE: 05/03/2023  PREOPERATIVE DIAGNOSIS:  Right middle lobe carcinoid tumor, clinical stage IA (T1, N0).  POSTOPERATIVE DIAGNOSIS:  Right middle lobe carcinoid tumor, clinical stage IA (T1, N0).  PROCEDURES PERFORMED:   Xi robotic-assisted right middle lobe wedge resection,  Lymph node dissection, and  Intercostal nerve blocks levels 3 through 10.  SURGEON:  Theresa Aloe. Luna Salinas, MD.  ASSISTANT:  Parris Bolognese, PA.  ANESTHESIA:  General.  FINDINGS:  Normal-appearing lymph nodes.  Nodule relatively close to minor fissure margin, which was negative for tumor on frozen section.  CLINICAL NOTE: [name] is a 78 year old woman who is a lifelong nonsmoker.  She recently had a CT which showed an increase in size of a right middle lobe lung nodule.  On PET CT, the nodule was hypermetabolic.  Robotic bronchoscopy was performed and needle aspiration showed a carcinoid tumor.  She was offered the option of surgical resection.  The indications, risks, benefits, and alternatives were discussed in detail with the patient.  She understood and accepted the risks and agreed to proceed.  OPERATIVE NOTE: [name] was brought to the preoperative holding area on 05/03/2023.  There, anesthesia placed an arterial blood pressure monitoring line and established intravenous access.  She was taken to the operating room, anesthetized, and  intubated with a double-lumen endotracheal tube.  Intravenous antibiotics were administered.  A Foley catheter was placed.  Sequential compression devices were placed on the calves for DVT prophylaxis.  She was placed in a left lateral decubitus position.  A Bair Hugger was placed for active warming.  The right chest was prepped and draped in the usual sterile  fashion.  Single lung ventilation of the left lung was initiated and was tolerated well throughout the procedure.  A time-out was performed.  A solution containing 20 mL of liposomal bupivacaine , 30 mL of 0.5% bupivacaine , and 50 mL of saline was prepared.  This solution was used for local at the incision sites as well as for the intercostal nerve blocks.  An incision was made in the eighth interspace in the midaxillary line.  An 8-mm robotic port was inserted.  The thoracoscope was advanced into the chest.  After confirming interpleural placement, carbon dioxide was insufflated per protocol.  A 12-mm robotic port was placed in the eighth interspace anterior to the camera port.  Intercostal nerve blocks then were performed from the third to the 10th interspace by injecting 10 mL of the bupivacaine  solution into a subpleural plane at each level.  A 12-mm AirSeal port was placed in the 10th interspace posterolaterally.  Two additional 8 interspace robotic ports were placed.  The robot was deployed.  The camera arm was docked.  Targeting was performed.  The remaining arms were docked.  The robotic instruments were inserted with fluoroscopic visualization.  While allowing the lung to further deflate, lymph node dissection was initiated.  The inferior pulmonary ligament was divided with bipolar cautery.  All lymph nodes were sent as separate specimens for permanent pathology.  A level 9 node was removed.  The lung then was retracted anteriorly and the pleural was divided at the hilum posteriorly.  Level 7 and 8 nodes were removed.  The space between the bronchus intermedius and right upper lobe bronchus was explored and a level 11 node was removed.  Working further superiorly,  a level 10 node was removed.  The paratracheal nodes were not dissected given that this was a carcinoid tumor.  By this point, the lung was sufficiently deflated.  There were some adhesions of the middle lobe to the anterior chest wall, which  were taken down with bipolar cautery.  The nodule was identified.  It was relatively close to the minor fissure.  A portion of the minor fissure anteriorly was completed with two firings of the robotic stapler.  A wedge resection was performed using the robotic stapler using a combination of green and blue cartridges.  After the wedge resection was completed, an 8-mm endoscopic retrieval bag was placed into the chest.  The specimen was placed into the bag and removed.  The nodule was easily palpable within the specimen.  It was over a centimeter from the gross margin on the minor fissure.  Since that was the closest stapled margin, it was marked and sent for frozen section, which subsequently returned with no tumor seen.  A level 12 node was identified along the pulmonary artery.  It was removed and sent for permanent pathology.  The port sites and staple lines were inspected and there was good hemostasis.  The sponges used during the dissection were removed.  The chest was copiously irrigated with saline.  The robotic instruments were removed.  The robot was undocked and the ports were removed.  A final inspection was made of the port sites.  Using a thoracoscope, a 28-French chest tube was placed through the anterior incision and directed to the apex.  It was secured with a #1 silk suture.  Dual lung ventilation was resumed.  The incisions were closed in standard fashion.  All sponge, needle, and instrument counts were correct at the end of the procedure.  The chest tube was placed to a Pleur-evac on waterseal.  The patient then was placed back in a supine position.  She was extubated in the operating room and taken to the post-anesthesia care unit in good condition.    MUK D: 05/03/2023 3:49:24 pm T: 05/04/2023 12:54:00 am  JOB: [id number]/ [id number]   Addendum Experience assistance was measured this case due to surgical complexity.  Parris Bolognese, PA assisted with port placement, camera management,  robot docking and undocking, instrument exchange, specimen retrieval, suctioning, and wound closure.

## 2023-05-04 NOTE — Progress Notes (Signed)
      [address]       [place] [address]             [phone]      1 Day Post-Op Procedure(s) (LRB): WEDGE RESECTION, LUNG, ROBOT-ASSISTED, THORACOSCOPIC (Right) BLOCK, NERVE, INTERCOSTAL (Right) LYMPH NODE BIOPSY (Right)  Subjective:  Patient overall doing well.  Multiple drug sensitivities for pain medications.  Currently in well controlled with Toradol /Fentanyl .  Large amount of serous drainage around chest tube  Objective: Vital signs in last 24 hours: Temp:  [97.6 F (36.4 C)-99.1 F (37.3 C)] 97.9 F (36.6 C) (05/03 0721) Pulse Rate:  [42-80] 65 (05/03 0721) Cardiac Rhythm: Normal sinus rhythm (05/03 0741) Resp:  [10-20] 18 (05/03 0721) BP: (125-184)/(60-96) 125/66 (05/03 0721) SpO2:  [96 %-100 %] 96 % (05/03 0721) Arterial Line BP: (63-103)/(55-94) 63/55 (05/02 1500) Weight:  [71.2 kg] 71.2 kg (05/02 1629)  Intake/Output from previous day: 05/02 0701 - 05/03 0700 In: 2233.7 [I.V.:2028; IV Piggyback:205.7] Out: 1374 [Urine:1250; Blood:70; Chest Tube:54] Intake/Output this shift: Total I/O In: -  Out: 20 [Chest Tube:20]  General appearance: alert, cooperative, and no distress Heart: regular rate and rhythm Lungs: clear to auscultation bilaterally Abdomen: soft, non-tender; bowel sounds normal; no masses,  no organomegaly Extremities: extremities normal, atraumatic, no cyanosis or edema Wound: clean, + serous drainage around chest tube  Lab Results: Recent Labs    05/01/23 1400 05/04/23 0212  WBC 6.8 14.8*  HGB 13.2 11.7*  HCT 41.2 35.7*  PLT 274 226   BMET:  Recent Labs    05/01/23 1400 05/04/23 0212  NA 143 135  K 4.3 3.7  CL 110 106  CO2 24 21*  GLUCOSE 96 118*  BUN 12 11  CREATININE 1.00 0.68  CALCIUM 9.1 8.3*    PT/INR:  Recent Labs    05/01/23 1400  LABPROT 14.3  INR 1.1   ABG No results found for: "PHART", "HCO3", "TCO2", "ACIDBASEDEF", "O2SAT" CBG (last 3)  Recent Labs    05/03/23 1724 05/03/23 2121  05/04/23 0605  GLUCAP 123* 127* 118*    Assessment/Plan: S/P Procedure(s) (LRB): WEDGE RESECTION, LUNG, ROBOT-ASSISTED, THORACOSCOPIC (Right) BLOCK, NERVE, INTERCOSTAL (Right) LYMPH NODE BIOPSY (Right)  CV- NSR, BP stable- on Lopresor 25 mg daily Pulm- CT on water seal, no air leak.. CXR w/o pneumothorax.. minimal output however patient hasn't been out of bed... if output is low can d/c chest tube today Renal- creatinine remains stable, okay to continue Toradol  GI- post surgical nausea, controlled... also has with most pain medications continue zofran  prn Lovenox for DVT prophylaxis Hypothyroid on synthroid CBGs controlled, patient is not a diabetic, stop SSIP   LOS: 1 day    Gates Kasal, PA-C 05/04/2023

## 2023-05-04 NOTE — Progress Notes (Signed)
 Mobility Specialist Progress Note:    05/04/23 1530  Mobility  Activity Ambulated with assistance in hallway  Level of Assistance Contact guard assist, steadying assist  Assistive Device None  Distance Ambulated (ft) 300 ft  Activity Response Tolerated well  Mobility Referral Yes  Mobility visit 1 Mobility  Mobility Specialist Start Time (ACUTE ONLY) 1400  Mobility Specialist Stop Time (ACUTE ONLY) 1415  Mobility Specialist Time Calculation (min) (ACUTE ONLY) 15 min   Received pt in chair having no complaints and agreeable to mobility. Pt was asymptomatic throughout ambulation and returned to room w/o fault. Left in bed w/ call bell in reach and all needs met.   Inetta Manes Mobility Specialist  Please contact vis Secure Chat or  Rehab Office [phone]

## 2023-05-04 NOTE — Plan of Care (Signed)

## 2023-05-05 ENCOUNTER — Inpatient Hospital Stay (HOSPITAL_COMMUNITY)

## 2023-05-05 LAB — COMPREHENSIVE METABOLIC PANEL WITH GFR
ALT: 46 U/L — ABNORMAL HIGH (ref 0–44)
AST: 39 U/L (ref 15–41)
Albumin: 2.7 g/dL — ABNORMAL LOW (ref 3.5–5.0)
Alkaline Phosphatase: 38 U/L (ref 38–126)
Anion gap: 8 (ref 5–15)
BUN: 15 mg/dL (ref 8–23)
CO2: 22 mmol/L (ref 22–32)
Calcium: 8.6 mg/dL — ABNORMAL LOW (ref 8.9–10.3)
Chloride: 107 mmol/L (ref 98–111)
Creatinine, Ser: 0.93 mg/dL (ref 0.44–1.00)
GFR, Estimated: >60 mL/min (ref >60)
Glucose, Bld: 105 mg/dL — ABNORMAL HIGH (ref 70–99)
Potassium: 3.5 mmol/L (ref 3.5–5.1)
Sodium: 137 mmol/L (ref 135–145)
Total Bilirubin: 0.5 mg/dL (ref 0.0–1.2)
Total Protein: 5 g/dL — ABNORMAL LOW (ref 6.5–8.1)

## 2023-05-05 LAB — CBC
HCT: 33.1 % — ABNORMAL LOW (ref 36.0–46.0)
Hemoglobin: 10.9 g/dL — ABNORMAL LOW (ref 12.0–15.0)
MCH: 30.6 pg (ref 26.0–34.0)
MCHC: 32.9 g/dL (ref 30.0–36.0)
MCV: 93 fL (ref 80.0–100.0)
Platelets: 199 10*3/uL (ref 150–400)
RBC: 3.56 MIL/uL — ABNORMAL LOW (ref 3.87–5.11)
RDW: 13.1 % (ref 11.5–15.5)
WBC: 9.4 10*3/uL (ref 4.0–10.5)
nRBC: 0 % (ref 0.0–0.2)

## 2023-05-05 MED ORDER — TRAMADOL HCL 50 MG PO TABS: 50.0000 mg | ORAL_TABLET | Freq: Four times a day (QID) | ORAL | 0 refills | Status: DC | PRN

## 2023-05-05 MED ORDER — ONDANSETRON HCL 4 MG PO TABS: 4.0000 mg | ORAL_TABLET | Freq: Three times a day (TID) | ORAL | 0 refills | Status: AC | PRN

## 2023-05-05 NOTE — Progress Notes (Addendum)
      [address]       [name] [address]             [phone]      2 Days Post-Op Procedure(s) (LRB): WEDGE RESECTION, LUNG, ROBOT-ASSISTED, THORACOSCOPIC (Right) BLOCK, NERVE, INTERCOSTAL (Right) LYMPH NODE BIOPSY (Right)  Subjective:  Patient doing okay.  States drainage has improved.  She was able to move her bowels yesterday after lactulose, has some mild diarrhea now.  Objective: Vital signs in last 24 hours: Temp:  [96.3 F (35.7 C)-98.2 F (36.8 C)] 98.1 F (36.7 C) (05/04 0745) Pulse Rate:  [61-75] 68 (05/04 0745) Cardiac Rhythm: Normal sinus rhythm (05/04 0824) Resp:  [19-24] 24 (05/04 0745) BP: (106-134)/(41-72) 111/49 (05/04 0745) SpO2:  [91 %-98 %] 95 % (05/04 0745)  Intake/Output from previous day: 05/03 0701 - 05/04 0700 In: -  Out: 40 [Chest Tube:40]  General appearance: alert, cooperative, and no distress Heart: regular rate and rhythm Lungs: clear to auscultation bilaterally Abdomen: soft, non-tender; bowel sounds normal; no masses,  no organomegaly Extremities: extremities normal, atraumatic, no cyanosis or edema Wound: clean, mild skin edge separation from port site.  Lab Results: Recent Labs    05/04/23 0212 05/05/23 0208  WBC 14.8* 9.4  HGB 11.7* 10.9*  HCT 35.7* 33.1*  PLT 226 199   BMET:  Recent Labs    05/04/23 0212 05/05/23 0208  NA 135 137  K 3.7 3.5  CL 106 107  CO2 21* 22  GLUCOSE 118* 105*  BUN 11 15  CREATININE 0.68 0.93  CALCIUM 8.3* 8.6*    PT/INR: No results for input(s): "LABPROT", "INR" in the last 72 hours. ABG No results found for: "PHART", "HCO3", "TCO2", "ACIDBASEDEF", "O2SAT" CBG (last 3)  Recent Labs    05/04/23 1107 05/04/23 1603 05/04/23 2059  GLUCAP 95 106* 111*    Assessment/Plan: S/P Procedure(s) (LRB): WEDGE RESECTION, LUNG, ROBOT-ASSISTED, THORACOSCOPIC (Right) BLOCK, NERVE, INTERCOSTAL (Right) LYMPH NODE BIOPSY (Right)  CV- NSR, BP controlled- continue Lopressor   25 mg BID Pulm- CT removed yesterday, no pneumothorax post pull, continue IS at discharge GI- constipation resolved, mild diarrhea due to laxative yesterday Pain control- patient is willing to attempt Tramadol  at discharge, she hasn't taken this in 10 years.. will use for severe pain.. will also provide zofran   Hypothyroid- on synthroid Dispo- patient stable, will d/c today   LOS: 2 days   Gates Kasal, PA-C 05/05/2023   Chart reviewed, patient examined, agree with above.  CXR ok. She can go home today.

## 2023-05-05 NOTE — Anesthesia Postprocedure Evaluation (Signed)
 Anesthesia Post Note  Patient: Io Donnel  Procedure(s) Performed: WEDGE RESECTION, LUNG, ROBOT-ASSISTED, THORACOSCOPIC (Right: Chest) BLOCK, NERVE, INTERCOSTAL (Right: Chest) LYMPH NODE BIOPSY (Right: Chest)     Patient location during evaluation: PACU Anesthesia Type: General Level of consciousness: awake and alert Pain management: pain level controlled Vital Signs Assessment: post-procedure vital signs reviewed and stable Respiratory status: spontaneous breathing, nonlabored ventilation, respiratory function stable and patient connected to nasal cannula oxygen Cardiovascular status: blood pressure returned to baseline and stable Postop Assessment: no apparent nausea or vomiting Anesthetic complications: no   No notable events documented.  Last Vitals:  Vitals:   05/05/23 0439 05/05/23 0745  BP: (!) 106/55 (!) 111/49  Pulse: 68 68  Resp: (!) 22 (!) 24  Temp: 36.4 C 36.7 C  SpO2: 96% 95%    Last Pain:  Vitals:   05/05/23 0745  TempSrc: Oral  PainSc:                  Lethaniel Rave

## 2023-05-06 ENCOUNTER — Encounter (HOSPITAL_COMMUNITY): Payer: Self-pay | Admitting: Thoracic Surgery (Cardiothoracic Vascular Surgery)

## 2023-05-07 LAB — SURGICAL PATHOLOGY

## 2023-05-08 DIAGNOSIS — R140 Abdominal distension (gaseous): Secondary | ICD-10-CM | POA: Insufficient documentation

## 2023-05-10 ENCOUNTER — Telehealth: Payer: Self-pay

## 2023-05-10 NOTE — Telephone Encounter (Signed)
 Patient contacted the office concerned about drainage from her chest tube sites. She is s/p RATS wedge with Dr. Luna Salinas 05/03/23 and was discharged from the hospital on Sunday 5/4. She has had to change the dressing several times and states that it is serosanguinous drainage. This happens mostly when she coughs or gets up out of the bed. Advised that this is not uncommon and should decrease over a couple days. Advised that if she was still having a lot of drainage from the previous chest tube sites she can contact the office back for further instructions. She also states that she has had a sore throat x3 days but it comes and goes. Advised she could see her PCP but that it sounds like post nasal drip. Advised that she can take over the counter antihistamines for sore throat and if it does not improve she needed to follow up with her PCP. She acknowledged receipt.

## 2023-05-15 ENCOUNTER — Ambulatory Visit: Attending: Thoracic Surgery (Cardiothoracic Vascular Surgery) | Admitting: Physician Assistant

## 2023-05-15 ENCOUNTER — Ambulatory Visit (HOSPITAL_COMMUNITY)
Admission: RE | Admit: 2023-05-15 | Discharge: 2023-05-15 | Disposition: A | Discharge status: Home / Self Care | Source: Ambulatory Visit | Attending: Physician Assistant | Admitting: Physician Assistant

## 2023-05-15 VITALS — BP 130/74 | HR 80 | Resp 20 | Ht 62.0 in | Wt 154.0 lb

## 2023-05-15 DIAGNOSIS — Z09 Encounter for follow-up examination after completed treatment for conditions other than malignant neoplasm: Secondary | ICD-10-CM | POA: Insufficient documentation

## 2023-05-15 DIAGNOSIS — D3A090 Benign carcinoid tumor of the bronchus and lung: Secondary | ICD-10-CM

## 2023-05-15 NOTE — Patient Instructions (Signed)
 Continue diligent wound care/dressing daily or as needed for soiling  Activity: up and walk as tolerated, no strenuous activity on right side  Thoracotomy Neuralgia-burning pain, most pain medication will not help relieve pain... should improve with time

## 2023-05-15 NOTE — Progress Notes (Signed)
 [address]       [name] [address]             [phone]     HPI:  Patient is S/P Robotic Right Video Assisted Thoracoscopy 05/03/2023.  She continued office with complaints of drainage from chest tube sites.  She was added to the schedule via the nurse for a wound check per patient's request.  Patient is concerned as the drainage from her chest tube sites persists.  She states it stopped but then started again after a coughing spell.  She has had a chronic cough for 10 plus years.  She also has burning pain along her chest and abdomen area.  Denies fevers   Current Outpatient Medications  Medication Sig Dispense Refill   acetaminophen  (TYLENOL ) 500 MG tablet Take 1,000 mg by mouth every 6 (six) hours as needed for mild pain (pain score 1-3).     albuterol  (VENTOLIN  HFA) 108 (90 Base) MCG/ACT inhaler Inhale 2 puffs into the lungs every 6 (six) hours as needed for shortness of breath or wheezing.     baclofen  (LIORESAL ) 20 MG tablet Take 20 mg by mouth daily.     cetirizine (ZYRTEC) 10 MG tablet Take 10 mg by mouth daily.     Cholecalciferol (VITAMIN D3) 125 MCG (5000 UT) TABS Take 5,000 Units by mouth daily.     dicyclomine  (BENTYL ) 10 MG capsule Take 10 mg by mouth daily as needed (bloating).     famotidine  (PEPCID ) 20 MG tablet Take 1 tablet (20 mg total) by mouth at bedtime. 90 tablet 3   fluticasone  (FLONASE ) 50 MCG/ACT nasal spray Place 2 sprays into both nostrils daily.     fluticasone -salmeterol (WIXELA INHUB) 250-50 MCG/ACT AEPB Inhale 1 puff into the lungs in the morning and at bedtime. 60 each 10   Hydroxychloroquine  Sulfate 300 MG TABS Take 300 mg by mouth daily.     levothyroxine  (SYNTHROID , LEVOTHROID) 88 MCG tablet Take 88 mcg by mouth daily before breakfast.     LORazepam  (ATIVAN ) 1 MG tablet Take 1 mg by mouth at bedtime.  0   Melatonin 5 MG TABS Take 5 mg by mouth at bedtime.     metoprolol  tartrate (LOPRESSOR ) 25 MG tablet Take 1 tablet (25 mg  total) by mouth daily. 90 tablet 2   ondansetron  (ZOFRAN ) 4 MG tablet Take 1 tablet (4 mg total) by mouth every 8 (eight) hours as needed for nausea or vomiting. 20 tablet 0   pantoprazole  (PROTONIX ) 20 MG tablet Take 20 mg by mouth daily.     traMADol  (ULTRAM ) 50 MG tablet Take 1 tablet (50 mg total) by mouth every 6 (six) hours as needed (mild pain). Take with food 30 tablet 0   vitamin B-12 (CYANOCOBALAMIN) 500 MCG tablet Take 500 mcg by mouth daily.     No current facility-administered medications for this visit.    Physical Exam:  BP 130/74   Pulse 80   Resp 20   Ht 5\' 2"  (1.575 m)   Wt 154 lb (69.9 kg)   SpO2 96% Comment: RA  BMI 28.17 kg/m   Gen: NAD Heart: RRR Lungs: CTA bilaterally Incisions: healing w/o infection, suture remains in place with area of erythema, yellow granulation tissue.. no signs of infection... appears to be serous drainage from chest tube site  A/P:  Persistent drainage from chest tube site.. this has been present since surgery... initially improved, but has now started again.... there  is no evidence of infection present.. drainage appears serous on dressing.. 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 unfortunately will not get much relief from narcotic pain medication.. she is very sensitive to Gabapentin , therefore I am not willing to attempt lyrica.  This should improve with time  Plan: this should resolve with time... will obtain 2V CXR to ensure no pleural effusion is present, however I did not auscultate diminished breath sounds on right side during exam.. Recommended continued diligent wound care, change dressing minimally daily or as needed for soilage.. if this continues to persist over the next 1-2 weeks may require steroid taper to try and stop drainage... she is scheduled to see Dr. Luna Salinas on 6/3   CXR reviewed: there is no pleural effusion present  Gates Kasal, PA-C [facility] [phone]

## 2023-05-16 ENCOUNTER — Other Ambulatory Visit: Payer: Self-pay

## 2023-05-16 NOTE — Progress Notes (Signed)
 The proposed treatment discussed in conference is for discussion purpose only and is not a binding recommendation.  The patients have not been physically examined, or presented with their treatment options.  Therefore, final treatment plans cannot be decided.

## 2023-05-29 ENCOUNTER — Other Ambulatory Visit: Payer: Self-pay | Admitting: Thoracic Surgery (Cardiothoracic Vascular Surgery)

## 2023-05-29 DIAGNOSIS — D3A090 Benign carcinoid tumor of the bronchus and lung: Secondary | ICD-10-CM

## 2023-05-29 DIAGNOSIS — R918 Other nonspecific abnormal finding of lung field: Secondary | ICD-10-CM

## 2023-06-04 ENCOUNTER — Ambulatory Visit (HOSPITAL_COMMUNITY)
Admission: RE | Admit: 2023-06-04 | Discharge: 2023-06-04 | Disposition: A | Discharge status: Home / Self Care | Source: Ambulatory Visit | Attending: Cardiovascular Disease | Admitting: Cardiovascular Disease

## 2023-06-04 ENCOUNTER — Ambulatory Visit
Payer: Self-pay | Attending: Thoracic Surgery (Cardiothoracic Vascular Surgery) | Admitting: Thoracic Surgery (Cardiothoracic Vascular Surgery)

## 2023-06-04 VITALS — BP 125/73 | HR 71 | Resp 20 | Ht 62.0 in | Wt 139.0 lb

## 2023-06-04 DIAGNOSIS — D3A090 Benign carcinoid tumor of the bronchus and lung: Secondary | ICD-10-CM | POA: Diagnosis present

## 2023-06-04 DIAGNOSIS — R918 Other nonspecific abnormal finding of lung field: Secondary | ICD-10-CM | POA: Diagnosis present

## 2023-06-04 DIAGNOSIS — Z09 Encounter for follow-up examination after completed treatment for conditions other than malignant neoplasm: Secondary | ICD-10-CM

## 2023-06-04 MED ORDER — TRAMADOL HCL 50 MG PO TABS: 50.0000 mg | ORAL_TABLET | Freq: Four times a day (QID) | ORAL | 0 refills | Status: DC | PRN

## 2023-06-04 NOTE — Progress Notes (Signed)
 [address], [place] [address]             [phone]    HPI: Mrs. Faso returns for follow-up after recent wedge resection.  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  She is followed by Dr. Waylan Haggard for her chronic cough.  CT was done which showed multiple small lung nodules.  December 2024 CT showed an increase in the size of her right middle lobe nodule.  Other scattered nodules were unchanged.  PET/CT showed increased metabolic activity.  Robotic bronchoscopy showed carcinoid tumor.  I did a robotic wedge resection on 05/03/2023.  She went home on day 2.  She came back about 10 days later with some drainage from her chest tube site.  That has since resolved.  She has a chronic cough.  Initially worsened after surgery but now it is improving.  Complains of pain.  Currently taking Tylenol  and then using tramadol  about once a day.  Stopped taking gabapentin  because she had fallen.  Past Medical History:  Diagnosis Date   Abnormal chest x-ray with multiple lung nodules 08/22/2017   Abnormal weight loss    Age-related osteoporosis without current pathological fracture 03/15/2015   Last Assessment & Plan:  Formatting of this note might be different from the original. Relevant Hx: Course: Daily Update: Today's Plan:discussed in depth with her and she is going to try the boniva when she gets back from her [place] trip, and she was advised to increase her vitamin D 3 to 3000 IU daily after review of her lab and we reviewed her DEXA last year and she will be due for this in 2018  El   Allergy    Alopecia    Anemia    Anxiety    antianxiety med. usually at night but can take during the day for anxiety    Arthralgia of hip 10/26/2019   Arthritis    both knees & hands  also the back & neck   Asthma    Atrial fibrillation (HCC)    pt. reports that [facility] that she had a "slight afib.", no further test needed (01/07/14 cardiology note does not mention any afib, just poor anterior r wave progression)   Breast pain, right 08/29/2021   Cataract    Cervical radiculopathy 01/19/2017   Chronic cough    Chronic rhinitis 12/04/2012   Followed in Pulmonary clinic/ [facility]/ [facility]   - sinus ct 10/23/2012 >>> Clear paranasal sinuses. - trial of qid 1st gen H1 02/12/2013 > not effective 05/28/13        Complication of anesthesia    Coronary artery disease involving native coronary artery of native heart without angina pectoris 06/08/2016   Formatting of this note might be different from the original. No longer sees cardiolgist.  ST okay.  Imaging with calcium   Cough variant asthma 06/18/2013   - Methacholine  challenge test 06/03/13 > equivocal but clinically caused tightness and made her cough - hfa 75% p coaching 06/17/13 > Trial of dulera 100 2bid started 06/18/2013     Degenerative cervical disc 06/08/2016   Depression    Diaphragmatic hernia    Dizziness 12/11/2019   Elbow tendonitis    Endometriosis    Gastric polyp  Gastroenteritis    GERD (gastroesophageal reflux disease)    Headache    Hiatal hernia 11/24/2013   High risk medication use 03/15/2015   History of colonic polyps 02/09/2019   History of hiatal hernia    pt. has been told that the hiatal hernia has reappeared   History of melanoma 06/17/2017   History of pneumonia    Hypercholesteremia    Hyperlipidemia    Hypertension    pt. reports that she use to take antihtn med, but reports since weight loss she has been able to come off.     Hypothyroidism    Insomnia    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   Laryngopharyngeal reflux (LPR) 07/19/2014   Left lumbar pain 09/19/2020   Lung nodules 09/16/2017    Formatting of this note might be different from the original. Following with [facility].  Stabel.Neg PET scan   Malaise and fatigue 03/15/2015   Last Assessment & Plan:  Formatting of this note might be different from the original. 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 reports that its severe, but doesn't use it anymore, since weight loss.   Study done in [place], not sure where.    Osteoarthritis    Osteoporosis    PONV (postoperative nausea and vomiting)    Prediabetes 11/18/2019   Primary insomnia 03/15/2015   Primary osteoarthritis involving multiple joints 03/15/2015   Last Assessment & Plan:  Formatting of this note might be different from the original. 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   Restless leg syndrome 11/18/2017   Right kidney mass 05/31/2021   S/P knee replacement 01/29/2014   Seasonal allergic rhinitis due to pollen 03/17/2019   Sinus bradycardia 12/07/2019   Sinus bradycardia by electrocardiogram 12/07/2019   Sleep apnea    does not wear a CPAP   Upper airway cough syndrome 08/04/2012   Followed in Pulmonary clinic/ [facility]/ [facility]  -Kozlow eval around 2012 pos dust/ roach  Neg resp to singulair  -  PFT's 02/2010 nl in effort dep portion of f/v loop, lung vol and dlcos also nl  - CT chest 05/27/12 wnl x small HH - 08/04/12   Alpha One AT >  MM - informed 09/10/2012 had not received demerol  rx on 09/02/12  - sinus ct 10/23/2012 >>> Clear paranasal sinuses. - responded to neuronti   Vitamin B 12 deficiency    Vitamin D deficiency     Current Outpatient Medications  Medication Sig Dispense Refill   acetaminophen  (TYLENOL ) 500 MG tablet Take 1,000 mg by mouth every 6 (six) hours as needed for mild pain (pain score 1-3).     albuterol  (VENTOLIN  HFA) 108 (90 Base) MCG/ACT inhaler Inhale 2 puffs into  the lungs every 6 (six) hours as needed for shortness of breath or wheezing.     baclofen  (LIORESAL ) 20 MG tablet Take 20 mg by mouth daily.     cetirizine (ZYRTEC) 10 MG tablet Take 10 mg by mouth daily.     Cholecalciferol (VITAMIN D3) 125 MCG (5000 UT) TABS Take 5,000 Units by mouth daily.     dicyclomine  (BENTYL ) 10 MG capsule Take 10 mg by mouth daily as needed (bloating).     famotidine  (PEPCID ) 20 MG tablet Take 1 tablet (20 mg total) by mouth at bedtime. 90 tablet 3   fluticasone  (FLONASE ) 50 MCG/ACT nasal spray Place 2 sprays into both nostrils daily.     fluticasone -salmeterol (WIXELA INHUB) 250-50 MCG/ACT AEPB Inhale 1 puff into the lungs in the morning and at bedtime. 60 each 10   Hydroxychloroquine  Sulfate 300 MG TABS Take 300 mg by mouth daily.     levothyroxine  (SYNTHROID , LEVOTHROID) 88 MCG tablet Take 88 mcg by mouth daily before breakfast.     LORazepam  (ATIVAN ) 1 MG tablet Take 1 mg by mouth at bedtime.  0   Melatonin 5 MG TABS Take 5 mg by mouth at bedtime.     metoprolol  tartrate (LOPRESSOR ) 25 MG tablet Take 1 tablet (25 mg total) by mouth daily. 90 tablet 2   ondansetron  (ZOFRAN ) 4 MG tablet Take 1 tablet (4 mg total) by mouth every 8 (eight) hours as needed for nausea or vomiting. 20 tablet 0   pantoprazole  (PROTONIX ) 20 MG tablet Take 20 mg by mouth daily.     vitamin B-12  (CYANOCOBALAMIN) 500 MCG tablet Take 500 mcg by mouth daily.     traMADol  (ULTRAM ) 50 MG tablet Take 1 tablet (50 mg total) by mouth every 6 (six) hours as needed (mild pain). Take with food 30 tablet 0   No current facility-administered medications for this visit.    Physical Exam BP 125/73   Pulse 71   Resp 20   Ht 5\' 2"  (1.575 m)   Wt 139 lb (63 kg)   SpO2 97% Comment: RA  BMI 25.38 kg/m  78 year old woman in no acute distress Alert and oriented x 3 with no focal deficits Lungs clear with equal breath sounds bilaterally Cardiac regular rate and rhythm Incisions clean dry and intact, no erythema or induration.  Diagnostic Tests: I personally reviewed her chest x-ray.  Nodular density right base less distinct than on previous chest x-ray.  Postoperative changes present.  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  Does not need any adjuvant therapy.  Does need long-term follow-up.  She would like to do that in [place].  Will refer her to Dr. Harles Lied who cares for her husband.  Status post wedge resection still has significant pain.  Only taking tramadol  about once a day.  She is down to 1 tablet on that.  I would give her a refill of that.  Told her if she needs it more frequently she can use it and we are happy to refill that medication for her.  She will let us  know.  I do not think she should drive yet until her pains improved.  Should build into new activities gradually to avoid undue discomfort.  Plan: Refer  to Dr. Harles Lied in [place].  New carcinoid tumor in the setting of multiple lung nodules.  Will need long-term follow-up. Prescription sent for  tramadol  50 mg p.o. every 6 hours as needed, 30 tablets, no refills Return in 1 month with PA lateral chest x-ray  Theresa Higashi, MD [facility] [phone]

## 2023-06-26 ENCOUNTER — Ambulatory Visit (INDEPENDENT_AMBULATORY_CARE_PROVIDER_SITE_OTHER)

## 2023-06-26 ENCOUNTER — Ambulatory Visit (INDEPENDENT_AMBULATORY_CARE_PROVIDER_SITE_OTHER): Admitting: Podiatry

## 2023-06-26 ENCOUNTER — Telehealth: Payer: Self-pay | Admitting: Podiatry

## 2023-06-26 DIAGNOSIS — M722 Plantar fascial fibromatosis: Secondary | ICD-10-CM

## 2023-06-26 DIAGNOSIS — M7662 Achilles tendinitis, left leg: Secondary | ICD-10-CM

## 2023-06-26 DIAGNOSIS — M7661 Achilles tendinitis, right leg: Secondary | ICD-10-CM | POA: Diagnosis not present

## 2023-06-26 MED ORDER — METHYLPREDNISOLONE 4 MG PO TBPK: ORAL_TABLET | ORAL | 0 refills | Status: DC

## 2023-06-26 NOTE — Patient Instructions (Signed)

## 2023-06-26 NOTE — Progress Notes (Signed)
 Chief Complaint  Patient presents with   Foot Pain    Bilateral ankle pain, today in the heel and back of ankle that goes up to the back of the ankle. Pain is Sharp at times and is not constant. She was treat for bilateral PF by Dr. Malvin in May of 2024.    HPI: 78 y.o. female presenting today with c/o pain in the bottom of the bilateral heels that is starting to spread up the back of the heels as well.  She is being treated by Dr. Kerrin for her lung nodules.  She is concerned about taking any medication or be administered any steroid shots today.  She would like any medication recommendations be run by her cardiothoracic surgeon.  She is wearing Horticulturist, commercial.  She does have PowerStep inserts but did not bring them with her today.  She has Medicare for insurance therefore does not have coverage for custom molded orthotics.  Past Medical History:  Diagnosis Date   Abnormal chest x-ray with multiple lung nodules 08/22/2017   Abnormal weight loss    Age-related osteoporosis without current pathological fracture 03/15/2015   Last Assessment & Plan:  Formatting of this note might be different from the original. Relevant Hx: Course: Daily Update: Today's Plan:discussed in depth with her and she is going to try the boniva when she gets back from her [place] trip, and she was advised to increase her vitamin D 3 to 3000 IU daily after review of her lab and we reviewed her DEXA last year and she will be due for this in 2018  El   Allergy    Alopecia    Anemia    Anxiety    antianxiety med. usually at night but can take during the day for anxiety   Arthralgia of hip 10/26/2019   Arthritis    both knees & hands  also the back & neck   Asthma    Atrial fibrillation (HCC)    pt. reports that [facility] [place] that she had a slight afib., no further test needed (01/07/14 cardiology note does not mention any afib, just poor anterior r wave progression)   Breast pain, right 08/29/2021   Cataract     Cervical radiculopathy 01/19/2017   Chronic cough    Chronic rhinitis 12/04/2012   Followed in Pulmonary clinic/ [facility]/ [place]   - sinus ct 10/23/2012 >>> Clear paranasal sinuses. - trial of qid 1st gen H1 02/12/2013 > not effective 05/28/13        Complication of anesthesia    Coronary artery disease involving native coronary artery of native heart without angina pectoris 06/08/2016   Formatting of this note might be different from the original. No longer sees cardiolgist.  ST okay.  Imaging with calcium   Cough variant asthma 06/18/2013   - Methacholine  challenge test 06/03/13 > equivocal but clinically caused tightness and made her cough - hfa 75% p coaching 06/17/13 > Trial of dulera 100 2bid started 06/18/2013     Degenerative cervical disc 06/08/2016   Depression    Diaphragmatic hernia    Dizziness 12/11/2019   Elbow tendonitis    Endometriosis    Gastric polyp    Gastroenteritis    GERD (gastroesophageal reflux disease)    Headache    Hiatal hernia 11/24/2013   High risk medication use 03/15/2015   History of colonic polyps 02/09/2019   History of hiatal hernia    pt. has been told that the hiatal  hernia has reappeared   History of melanoma 06/17/2017   History of pneumonia    Hypercholesteremia    Hyperlipidemia    Hypertension    pt. reports that she use to take antihtn med, but reports since weight loss she has been able to come off.     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 Following with [facility].  Stabel.Neg PET scan   Malaise and fatigue 03/15/2015   Last Assessment & Plan:  Formatting of this note might be different from the original. 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 reports that its severe, but doesn't use it anymore, since weight loss.   Study done in [place], not sure where.    Osteoarthritis    Osteoporosis    PONV (postoperative nausea and vomiting)    Prediabetes 11/18/2019   Primary insomnia 03/15/2015   Primary osteoarthritis involving multiple joints 03/15/2015   Last Assessment & Plan:  Formatting of this note might be different from the original. 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   Restless leg syndrome 11/18/2017   Right kidney mass 05/31/2021   S/P knee replacement 01/29/2014   Seasonal allergic rhinitis due to pollen 03/17/2019   Sinus bradycardia 12/07/2019   Sinus bradycardia by electrocardiogram 12/07/2019   Sleep apnea    does not wear a CPAP   Upper airway cough syndrome 08/04/2012   Followed in Pulmonary clinic/ [facility]/ [place]  -Kozlow eval around 2012 pos dust/ roach  Neg resp to singulair  - PFT's 02/2010 nl in effort dep portion of f/v loop, lung vol and dlcos also nl  - CT chest 05/27/12 wnl x small HH - 08/04/12   Alpha One AT >  MM - informed 09/10/2012 had not received demerol  rx on 09/02/12  - sinus ct 10/23/2012 >>> Clear paranasal sinuses. - responded to neuronti   Vitamin B 12 deficiency    Vitamin D deficiency    Past Surgical  History:  Procedure Laterality Date   ABDOMINAL HYSTERECTOMY     APPENDECTOMY     BRONCHIAL BIOPSY  03/26/2023   Procedure: BRONCHOSCOPY, WITH BIOPSY;  Surgeon: Shelah Lamar RAMAN, MD;  Location: [facility] ENDOSCOPY;  Service: Pulmonary;;   BRONCHIAL BRUSHINGS  03/26/2023   Procedure: BRONCHOSCOPY, WITH BRUSH BIOPSY;  Surgeon: Shelah Lamar RAMAN, MD;  Location: [facility] ENDOSCOPY;  Service: Pulmonary;;   BRONCHIAL NEEDLE ASPIRATION BIOPSY  03/26/2023   Procedure: BRONCHOSCOPY, WITH NEEDLE ASPIRATION BIOPSY;  Surgeon: Shelah Lamar RAMAN, MD;  Location: [facility] ENDOSCOPY;  Service:  Pulmonary;;   CHOLECYSTECTOMY     COLONOSCOPY  05/09/2015   Colonic Polyps, moderate sigmoid diverticulosis. Bx: Tubular Adenomas. Negative   DILATION AND CURETTAGE, DIAGNOSTIC / THERAPEUTIC     ESOPHAGEAL DILATION  08/2016   EYE SURGERY     cataracts removed / w IOL   FUDUCIAL PLACEMENT  03/26/2023   Procedure: INSERTION, FIDUCIAL MARKER, GOLD;  Surgeon: Shelah Lamar RAMAN, MD;  Location: [facility] ENDOSCOPY;  Service: Pulmonary;;   GALLBLADDER SURGERY     HERNIA REPAIR     HIATAL HERNIA REPAIR  2/14   HIATAL HERNIA REPAIR  2014   revision of 2018 in [place] NERVE BLOCK Right 05/03/2023   Procedure: BLOCK, NERVE, INTERCOSTAL;  Surgeon: Kerrin Elspeth BROCKS, MD;  Location: [facility] OR;  Service: Thoracic;  Laterality: Right;   KNEE ARTHROSCOPY     both knees for torn cartilage    LYMPH NODE BIOPSY Right 05/03/2023   Procedure: LYMPH NODE BIOPSY;  Surgeon: Kerrin Elspeth BROCKS, MD;  Location: [facility] OR;  Service: Thoracic;  Laterality: Right;   NERVE REPAIR Left 07/11/2017   Procedure: Excision of saphenous nerve left knee. Right knee intra-articular injection.;  Surgeon: Ernie Cough, MD;  Location: [facility] ORS;  Service: Orthopedics;  Laterality: Left;  60 mins   OOPHORECTOMY     TONSILLECTOMY     TOTAL KNEE ARTHROPLASTY Left 01/29/2014   Procedure: LEFT TOTAL KNEE ARTHROPLASTY MCL REPAIR;  Surgeon: Elspeth JONELLE Her, MD;  Location: [facility] OR;   Service: Orthopedics;  Laterality: Left;   VESICOVAGINAL FISTULA CLOSURE W/ TAH     VIDEO BRONCHOSCOPY WITH ENDOBRONCHIAL NAVIGATION Right 03/26/2023   Procedure: VIDEO BRONCHOSCOPY WITH ENDOBRONCHIAL NAVIGATION;  Surgeon: Shelah Lamar RAMAN, MD;  Location: [facility] ENDOSCOPY;  Service: Pulmonary;  Laterality: Right;  WITH FLUORO WITH BIOPSY   WEDGE RESECTION, LUNG, ROBOT-ASSISTED, THORACOSCOPIC Right 05/03/2023   Procedure: WEDGE RESECTION, LUNG, ROBOT-ASSISTED, THORACOSCOPIC;  Surgeon: Kerrin Elspeth BROCKS, MD;  Location: [facility] OR;  Service: Thoracic;  Laterality: Right;  RIGHT MIDDLE LOBE WEDGE RESECTION   Allergies  Allergen Reactions   Iodinated Contrast Media Hives and Rash   Neurontin  [Gabapentin ] Other (See Comments)    Severe falls,dizziness, forgetful    Ciprofloxacin Diarrhea   Codeine  Nausea Only   Egg-Derived Products     GI upset per patient   Misc. Sulfonamide Containing Compounds Other (See Comments)   Omnicef [Cefdinir] Other (See Comments)    Unknown reaction.   Other Nausea And Vomiting    General anesthesia    Oxycodone  Nausea And Vomiting   Tramadol      REACTION: GI discomfort with high doses   Cyclobenzaprine Palpitations   Sulfa Antibiotics Rash     Physical Exam: General: The patient is alert and oriented x3 in no acute distress.  Dermatology:  No ecchymosis, erythema, or edema bilateral.  No open lesions.    Vascular: Palpable pedal pulses bilaterally. Capillary refill within normal limits.  No appreciable edema.    Neurological: Epicritic sensation is intact  Musculoskeletal Exam:  There is pain on palpation of the plantarmedial & plantarcentral aspect of bilateral heel.  No gaps or nodules within the plantar fascia.  Positive Windlass mechanism bilateral.  Antalgic gait noted with first few steps upon standing.  There is pain on palpation of the distal Achilles tendon and its insertion to the posterior heel bilateral.  Ankle df less than 10 degrees with knee extended  b/l.  Radiographic Exam (bilateral foot, 3 weightbearing views, 06/26/2023):  Normal osseous  mineralization.  Joint space narrowing at the bilateral first MPJ.  There is mild dorsal spurring at the dorsal aspect of the first metatarsal-medial cuneiform joint.  Moderate inferior calcaneal spurs bilateral.  There are also small calcifications at the distal Achilles tendon near the insertion.  Assessment/Plan of Care: 1. Bilateral plantar fasciitis   2. Achilles tendinitis of both lower extremities     Meds ordered this encounter  Medications   methylPREDNISolone  (MEDROL  DOSEPAK) 4 MG TBPK tablet    Sig: Take as directed    Dispense:  21 tablet    Refill:  0   None  -Reviewed etiology of plantar fasciitis with patient.  Discussed treatment options with patient today, including cortisone injection, NSAID course of treatment, stretching exercises, physical therapy, use of night splint, rest, icing the heel, arch supports/orthotics, and supportive shoe gear.    Patient declined a cortisone injection today.  Stretching exercises printed and dispensed at checkout.  Brooks shoe insoles were modified slightly since she already has power steps.  Hopefully she will bring the power steps with her at a future visit if this padding works well the boot so that we can incorporate it into the power steps.  Reached out to her cardiothoracic surgeon to see if a Medrol  Dosepak would be approved.  He did give approval, so this was sent to her pharmacy to begin taking immediately to help with pain and inflammation.  Return in about 3 weeks (around 07/17/2023) for f/u achilles tendonitis, f/u plantar fasciitis.   Awanda CHARM Imperial, DPM, FACFAS [facility]     [address] [place], [place] [address]                Office [phone]  Fax [phone]

## 2023-06-27 NOTE — Progress Notes (Signed)
 [name] is a 78 y.o.female.  Subjective:   This is a complex patient who comes in today for right sided pain and a rash under her breast specifically the right breast.  She was concerned that it could be shingles, she had just undergone surgery for new diagnosis of lung cancer.  She had undergone a robotic assisted right middle lobe wedge resection 1 month prior.  She has a longstanding history of a cough and pulmonary nodules that she had been followed by pulmonary in [facility] for.  In fact she had undergone a CT scan in December 2020 for which showed multiple small lung nodules the CT showed an increase in size of her right middle lobe nodule other scattered nodules were unchanged, a PET/CT showed increased metabolic activity and again the robotic bronchoscopy showed a carcinoid tumor she had her wedge resection done May 2 she went home on day 2 she came back about 10 days later with drainage from the chest tube site that has since resolved she has a chronic cough he had noted again initially worsened after surgery but appears to be improving some she also would had a prescription for gabapentin  but she stopped it because she had actually fallen with it.  Her last visit with pulmonary would have been March 26 of this year prior to any and all of this develop and again she had had the CT scan done in December which showed multiple nodules 1 which had grown significantlyAnd at that time at the March visit she was just awaiting the pathology report.  This is all been very traumatizing for her but it was very early stage the pathology report indicated she likely would need no other definitive treatment for this but they wanted her to follow-up with oncology to kind to get established and see how often she would need to have imaging done for this.  Plan: I had a lengthy discussion with her about her surgery, the pathology report, the upcoming pended oncology visit, her blood pressure was really good here  today we looked at the breast and it appears to be more of a heat rash/candidiasis rash I have sent in Diflucan as well as some topical ointment for this to see if we can take it down I think again it is heat related since our temperatures here in this area have been significantly humid and up to 100 with a heat index she is trying to stay inside is much as she can but obviously her breast are larger so there is a little bit of an increased risk for sweating I talked with her about trying to keep the skin clean and dry Goldbond powder can sometimes help with this  _________________________________    Diagnoses this Encounter 1. Candidiasis of breast      2. Hx of malignant carcinoid tumor      3. S/P partial lobectomy of lung        Chief Complaint  Patient presents with  . Right side pain/rash under breast   Physical Exam Vitals reviewed.  Constitutional:      Appearance: Normal appearance. She is obese.  HENT:     Head: Normocephalic.     Right Ear: Tympanic membrane normal.     Left Ear: Tympanic membrane normal.     Nose: No congestion or rhinorrhea.     Mouth/Throat:     Mouth: Mucous membranes are moist.   Eyes:     Pupils: Pupils are equal, round, and reactive  to light.    Cardiovascular:     Rate and Rhythm: Normal rate and regular rhythm.     Pulses: Normal pulses.     Heart sounds: Normal heart sounds.  Pulmonary:     Effort: Pulmonary effort is normal.     Breath sounds: Normal breath sounds.  Abdominal:     Palpations: Abdomen is soft.   Musculoskeletal:        General: Normal range of motion.     Cervical back: Normal range of motion.   Skin:    General: Skin is warm and dry.     Capillary Refill: Capillary refill takes less than 2 seconds.     Comments: Area under the right breast appears to be heat rash/candidiasis   Neurological:     Mental Status: She is alert and oriented to person, place, and time.   Psychiatric:        Mood and Affect:  Mood normal.     ______________________ Vitals:   06/27/23 1557  BP: 122/70  BP Location: Left arm  Patient Position: Sitting  Pulse: 66  Resp: 16  Temp: 98.1 F (36.7 C)  TempSrc: Temporal  Weight: 64.4 kg (142 lb)  Height: 1.626 m (5' 4)    Patient's Medications  New Prescriptions   FLUCONAZOLE (DIFLUCAN) 100 MG TABLET    Take 1 tablet (100 mg total) by mouth daily for 7 days.  Previous Medications   BACLOFEN  (LIORESAL ) 20 MG TABLET    Take 1 tablet by mouth nightly. And in afternoon if needed   CALCIUM 26-VIT D3-MAGNESIUM 15 ORAL    Take 1 tablet by mouth nightly. Also with zinc.  On hold  1/21 for surgery 01/28/23   CETIRIZINE (ZYRTEC) 5 MG TABLET    Take 5 mg by mouth daily.   CHOLECALCIFEROL (VITAMIN D3) 5,000 UNIT (125 MCG) TAB TABLET    Take 5,000 Units by mouth daily. On hold  1/21 for surgery 01/28/23   CYANOCOBALAMIN (VITAMIN B12) 250 MCG TABLET    Take 250 mcg by mouth daily. On hold  1/21 for surgery 01/28/23   DICYCLOMINE  (BENTYL ) 10 MG CAPSULE    Take 2 capsules (20 mg total) by mouth 3 (three) times a day.   FAMOTIDINE  (PEPCID ) 20 MG TABLET    Take 1 tablet (20 mg total) by mouth nightly.   FLUTICASONE  PROPION-SALMETEROL (ADVAIR DISKUS) 250-50 MCG/DOSE DISKUS INHALER    Inhale 1 puff  in the morning and 1 puff before bedtime.   FLUTICASONE  PROPIONATE (FLONASE ) 50 MCG/SPRAY NASAL SPRAY    Administer 1 spray into each nostril daily.   HYDROXYCHLOROQUINE  (PLAQUENIL ) 300 MG TAB TABLET    Take 1 tablet (300 mg total) by mouth daily.   IBUPROFEN (MOTRIN) 200 MG TABLET    Take 200 mg by mouth every 8 (eight) hours as needed. On hold  1/21 for surgery 01/28/23   LEVOTHYROXINE  (SYNTHROID ) 88 MCG TABLET    Take 1 tablet (88 mcg total) by mouth daily.   LORAZEPAM  (ATIVAN ) 1 MG TABLET    Take 1 tablet every 8 hrs as needed for anxiety or sleep   MELATONIN TABLET    Take 5 mg by mouth nightly.   METHYLPREDNISOLONE  (MEDROL  DOSEPAK) 4 MG 6 DAY DOSE PACK       METOPROLOL  TARTRATE  (LOPRESSOR ) 25 MG TABLET    Take 25 mg by mouth Once Daily.   PANTOPRAZOLE  (PROTONIX ) 20 MG EC TABLET    Take 1 tablet (20 mg total)  by mouth daily.   PROMETHAZINE  (PHENERGAN ) 12.5 MG TABLET    May take one every 8 hours as needed for nausea   PROMETHAZINE -DEXTROMETHORPHAN  (PHENERGAN  DM) 6.25-15 MG/5 ML SYRP SYRUP    Take 5 mL by mouth every 6 (six) hours as needed (cough).   TRAMADOL  (ULTRAM ) 50 MG TABLET    Take 50 mg by mouth every 6 (six) hours as needed for moderate pain (4-6).   VALACYCLOVIR (VALTREX) 500 MG TABLET    Take 1 tablet (500 mg total) by mouth 2 (two) times a day Indications: herpes simplex infection.   VENTOLIN  HFA 90 MCG/ACTUATION INHALER    Inhale 2 puffs every 4 (four) hours as needed.  Modified Medications   No medications on file  Discontinued Medications   No medications on file   No orders of the defined types were placed in this encounter.  Problem List[1] Medical History[2] Surgical History[3] Family History[4] Social History   Socioeconomic History  . Marital status: Married    Spouse name: Not on file  . Number of children: Not on file  . Years of education: Not on file  . Highest education level: Not on file  Occupational History  . Not on file  Tobacco Use  . Smoking status: Never  . Smokeless tobacco: Never  Vaping Use  . Vaping status: Never Used  Substance and Sexual Activity  . Alcohol use: No  . Drug use: No    Comment: Drug use: Denies  . Sexual activity: Not on file  Other Topics Concern  . Not on file  Social History Narrative  . Not on file   Social Drivers of Health   Food Insecurity: No Food Insecurity (05/03/2023)   Received from [facility]   Food vital sign   . Within the past 12 months, you worried that your food would run out before you got money to buy more: Never true   . Within the past 12 months, the food you bought just didn't last and you didn't have money to get more: Never true  Transportation Needs: No  Transportation Needs (05/03/2023)   Received from [facility] - Transportation   . Lack of Transportation (Medical): No   . Lack of Transportation (Non-Medical): No  Safety: Patient Unable To Answer (05/03/2023)   Received from [facility]   Safety   . Within the last year, have you been afraid of your partner or ex-partner?: Patient unable to answer   . Within the last year, have you been humiliated or emotionally abused in other ways by your partner or ex-partner?: Patient unable to answer   . Within the last year, have you been kicked, hit, slapped, or otherwise physically hurt by your partner or ex-partner?: Patient unable to answer   . Within the last year, have you been raped or forced to have any kind of sexual activity by your partner or ex-partner?: Patient unable to answer  Living Situation: Low Risk  (05/03/2023)   Received from [facility] Situation   . In the last 12 months, was there a time when you were not able to pay the mortgage or rent on time?: No   . In the past 12 months, how many times have you moved where you were living?: 0   . At any time in the past 12 months, were you homeless or living in a shelter (including now)?: No   Allergies[5]  Review of Systems  Constitutional:  Positive for fatigue. Negative for chills and fever.  HENT:  Negative for congestion, nosebleeds, postnasal drip, rhinorrhea, sinus pressure and sore throat.   Respiratory:  Negative for chest tightness and shortness of breath.   Cardiovascular:  Negative for chest pain and palpitations.  Gastrointestinal:  Negative for blood in stool, constipation and nausea.  Genitourinary:  Negative for dysuria, flank pain, frequency and urgency.  Musculoskeletal:  Negative for arthralgias.  Skin:  Negative for color change.  Neurological:  Negative for dizziness and light-headedness.  Hematological:  Negative for adenopathy.  Psychiatric/Behavioral:  Negative for agitation.            [1] Patient Active Problem List Diagnosis  . Hiatal hernia  . Gastroesophageal reflux disease without esophagitis  . S/P Nissen fundoplication (without gastrostomy tube) procedure  . Mixed hyperlipidemia  . Hypothyroidism  . Primary insomnia  . Primary osteoarthritis involving multiple joints  . Age-related osteoporosis without current pathological fracture  . Vitamin B 12 deficiency  . Vitamin D deficiency  . Coronary artery disease involving native coronary artery of native heart without angina pectoris  . Degenerative cervical disc  . Cervical radiculopathy  . Pulmonary nodules  . History of melanoma  . Laryngopharyngeal reflux  . Restless leg syndrome  . Moderate depressive disorder  . Personal history of colonic polyps  . Seasonal allergic rhinitis due to pollen  . Irritable bowel syndrome with diarrhea  . Arthralgia of hip  . Prediabetes  . Degenerative disc disease, lumbar  . Right kidney mass  . Caliectasis determined by ultrasound of kidney  . Spinal stenosis of lumbar region with neurogenic claudication  . Hx of malignant carcinoid tumor  . Abdominal bloating  . S/P partial lobectomy of lung  [2] Past Medical History: Diagnosis Date  . Abnormal electrocardiogram   . Allergic rhinitis   . Alopecia   . Anxiety   . Asthma (CMD)   . Colon polyps   . Depression   . Diaphragmatic hernia   . Elbow tendonitis   . GERD (gastroesophageal reflux disease)   . Hirsutism   . History of fall   . Hypercholesterolemia   . Hypertension   . IBS (irritable bowel syndrome)   . Insomnia   . Lung nodules    seen on CT scan 01/2023  . Malaise and fatigue   . Osteoarthritis   . Osteoporosis   . Otitis media   . Palpitations   . Plantar fasciitis   . PONV (postoperative nausea and vomiting)   . PVC (premature ventricular contraction)   . Senile purpura   . Sleep apnea   . Thyroid disease   . Transient synovitis   . Vitamin B12 deficiency   . Vitamin D deficiency    . Wears glasses   [3] Past Surgical History: Procedure Laterality Date  . APPENDECTOMY  age 30 y  . CATARACT EXTRACTION, BILATERAL    . CHOLECYSTECTOMY     Procedure: CHOLECYSTECTOMY 2022  . COLONOSCOPY     Procedure: COLONOSCOPY  . DILATION AND CURETTAGE OF UTERUS  1994  . DILATION AND CURETTAGE, DIAGNOSTIC / THERAPEUTIC     Procedure: DILATION AND CURETTAGE, DIAGNOSTIC / THERAPEUTIC  . ESOPHAGOGASTRODUODENOSCOPY     Procedure: ESOPHAGOGASTRODUODENOSCOPY  . ESOPHAGOGASTRODUODENOSCOPY N/A 11/23/2014   Procedure: EGD;  Surgeon: Oneil Vicenta Claudene PONCE, MD;  Location: [facility] ENDO OR;  Service: Gastroenterology;  Laterality: N/A;  . EXPLORATORY LAPAROTOMY     Procedure: EXPLORATORY LAPAROTOMY  . HERNIA REPAIR  hiatal  . HYSTERECTOMY   1994  . KNEE SURGERY  RT x 2 and LT    1994; 2002;  . MELANOMA EXCISION FOOT Left    heel  . OTHER SURGICAL HISTORY     Procedure: OTHER SURGICAL HISTORY (nissen)  . TONSILLECTOMY  age 67 y  . TOTAL KNEE ARTHROPLASTY  LT--2016  . TUBAL LIGATION  1990's  [4] Family History Problem Relation Name Age of Onset  . Arthritis Mother    . Asthma Mother    . Cancer Mother    . Arthritis Father    . COPD Father    . Heart disease Father    . Cancer Sister    . Arthritis Brother    . Asthma Brother    . Cancer Maternal Aunt    . Cancer Paternal Aunt    . Early death Paternal Aunt    . Heart disease Paternal Aunt    . Cancer Maternal Grandmother    . Heart disease Maternal Grandmother    . Heart disease Paternal Grandmother    . Rheum arthritis Neg Hx    . Lupus Neg Hx    . Ankylosing spondylitis Neg Hx    . Psoriasis Neg Hx    . Gout Neg Hx    [5] Allergies Allergen Reactions  . Gabapentin  Dizziness and Other (See Comments)    Severe falls   . Iodinated Contrast Media Rash  . Cefdinir Diarrhea  . Ciprofloxacin Diarrhea    diarrhea  . Codeine  GI Intolerance and Other (See Comments)    Pt states causes nightmares  . Oxycodone  GI  Intolerance  . Tramadol  Other (See Comments)    Stomach aches  . Cyclobenzaprine GI Intolerance and Palpitations  . Egg Rash    GI upset per patient  . Sulfamethoxazole Rash

## 2023-06-28 ENCOUNTER — Other Ambulatory Visit: Payer: Self-pay | Admitting: Thoracic Surgery (Cardiothoracic Vascular Surgery)

## 2023-06-28 DIAGNOSIS — R918 Other nonspecific abnormal finding of lung field: Secondary | ICD-10-CM

## 2023-06-30 ENCOUNTER — Encounter: Payer: Self-pay | Admitting: Podiatry

## 2023-07-08 NOTE — Telephone Encounter (Signed)
 Copied from CRM #[id number]. Topic: Medication/Rx - Rx Refill >> Jul 08, 2023  9:27 AM April H wrote: [name], [name] is calling for medication request (Obtain: Medication Name, Dosage, Quantity, Frequency, Quantity Requested (example: 30-day supply.)   Include all details related to the request(s) below: Patient says [name] prescribed her something for yeast under her breast. Not sure of name of it. Asking if she can get a refill    Confirm and type the Best Contact Number below:  Patient/caller contact number:  [phone]           [] Home  [x] Mobile  [] Work  []  Other   [x]  Okay to leave a voicemail   Medication List:  Current Outpatient Medications:  .  baclofen  (LIORESAL ) 20 mg tablet, Take 1 tablet by mouth nightly. And in afternoon if needed, Disp: , Rfl:  .  CALCIUM 26-VIT D3-MAGNESIUM 15 ORAL, Take 1 tablet by mouth nightly. Also with zinc.  On hold  1/21 for surgery 01/28/23, Disp: , Rfl:  .  cetirizine (ZyrTEC) 5 mg tablet, Take 5 mg by mouth daily., Disp: , Rfl:  .  cholecalciferol (VITAMIN D3) 5,000 unit (125 mcg) tab tablet, Take 5,000 Units by mouth daily. On hold  1/21 for surgery 01/28/23, Disp: , Rfl:  .  cyanocobalamin (VITAMIN B12) 250 mcg tablet, Take 250 mcg by mouth daily. On hold  1/21 for surgery 01/28/23, Disp: , Rfl:  .  dicyclomine  (BENTYL ) 10 mg capsule, Take 2 capsules (20 mg total) by mouth 3 (three) times a day., Disp: 180 capsule, Rfl: 3 .  famotidine  (PEPCID ) 20 mg tablet, Take 1 tablet (20 mg total) by mouth nightly., Disp: 90 tablet, Rfl: 3 .  fluticasone  propion-salmeteroL (ADVAIR DISKUS) 250-50 mcg/dose diskus inhaler, Inhale 1 puff  in the morning and 1 puff before bedtime., Disp: , Rfl:  .  fluticasone  propionate (FLONASE ) 50 mcg/spray nasal spray, Administer 1 spray into each nostril daily., Disp: , Rfl:  .  hydroxychloroquine  (PLAQUENIL ) 300 mg tab tablet, Take 1 tablet (300 mg total) by mouth daily., Disp: 90 tablet, Rfl: 3 .   ibuprofen (MOTRIN) 200 mg tablet, Take 200 mg by mouth every 8 (eight) hours as needed. On hold  1/21 for surgery 01/28/23, Disp: , Rfl:  .  levothyroxine  (SYNTHROID ) 88 mcg tablet, Take 1 tablet (88 mcg total) by mouth daily., Disp: 90 tablet, Rfl: 3 .  LORazepam  (ATIVAN ) 1 mg tablet, Take 1 tablet every 8 hrs as needed for anxiety or sleep, Disp: 30 tablet, Rfl: 1 .  melatonin tablet, Take 5 mg by mouth nightly., Disp: , Rfl:  .  methylPREDNISolone  (MEDROL  DOSEPAK) 4 mg 6 day dose pack, , Disp: , Rfl:  .  metoprolol  tartrate (LOPRESSOR ) 25 mg tablet, Take 25 mg by mouth Once Daily., Disp: , Rfl:  .  pantoprazole  (PROTONIX ) 20 mg EC tablet, Take 1 tablet (20 mg total) by mouth daily., Disp: 90 tablet, Rfl: 3 .  promethazine  (PHENERGAN ) 12.5 mg tablet, May take one every 8 hours as needed for nausea, Disp: 30 tablet, Rfl: 2 .  promethazine -dextromethorphan  (PHENERGAN  DM) 6.25-15 mg/5 mL syrp syrup, Take 5 mL by mouth every 6 (six) hours as needed (cough)., Disp: 120 mL, Rfl: 1 .  traMADoL  (ULTRAM ) 50 mg tablet, Take 50 mg by mouth every 6 (six) hours as needed for moderate pain (4-6)., Disp: , Rfl:  .  valACYclovir (VALTREX) 500 mg tablet, Take 1 tablet (500 mg total) by mouth 2 (two) times  a day Indications: herpes simplex infection., Disp: 10 tablet, Rfl: 2 .  Ventolin  HFA 90 mcg/actuation inhaler, Inhale 2 puffs every 4 (four) hours as needed., Disp: , Rfl:      Medication Request/Refills: Pharmacy Information (if applicable)   []  Not Applicable       [x]  Pharmacy listed  Send Medication Request to: [facility]                                                 []  Pharmacy not listed (added to pharmacy list in Epic) Send Medication Request to:      Listed Pharmacies: [facility] - [place], [place] - [address] - PHONE: [phone] - FAX: [phone] [id number] - [place], [place] - [address] AT [address] - PHONE: [phone] - FAX:  [phone] [id number] [place], [place] - [address] - PHONE: [phone] - FAX: [phone] [id number] - [place], [place] - [address] - PHONE: [phone] - FAX: [phone]

## 2023-07-09 ENCOUNTER — Ambulatory Visit
Payer: Self-pay | Attending: Thoracic Surgery (Cardiothoracic Vascular Surgery) | Admitting: Thoracic Surgery (Cardiothoracic Vascular Surgery)

## 2023-07-09 ENCOUNTER — Ambulatory Visit (HOSPITAL_COMMUNITY)
Admission: RE | Admit: 2023-07-09 | Discharge: 2023-07-09 | Disposition: A | Discharge status: Home / Self Care | Source: Ambulatory Visit | Attending: Cardiology | Admitting: Cardiology

## 2023-07-09 VITALS — Ht 62.0 in | Wt 139.0 lb

## 2023-07-09 DIAGNOSIS — Z09 Encounter for follow-up examination after completed treatment for conditions other than malignant neoplasm: Secondary | ICD-10-CM

## 2023-07-09 DIAGNOSIS — R918 Other nonspecific abnormal finding of lung field: Secondary | ICD-10-CM | POA: Diagnosis present

## 2023-07-09 MED ORDER — TRAMADOL HCL 50 MG PO TABS
50.0000 mg | ORAL_TABLET | Freq: Two times a day (BID) | ORAL | 0 refills | Status: DC | PRN
Start: 2023-07-09 — End: 2023-08-13

## 2023-07-09 NOTE — Telephone Encounter (Signed)
 Patient is calling to check the status of her medication.  Advised that it was approved and sent over to the pharmacy.  Patient voiced understanding

## 2023-07-09 NOTE — Progress Notes (Signed)
 [address], [name] [address]             [phone]   HPI: 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  [name] is a 78 year old woman with an extensive past medical history.  She has been followed by Dr. Theophilus for a chronic cough.  She had a CT which showed multiple nodules.  In December 2024 there was an increase in the size of the right middle lobe nodule.  PET/CT there was some mild uptake.  Robotic bronchoscopy diagnosis carcinoid tumor.  She underwent a robotic right middle lobe wedge resection on 05/03/2023.  She went home on postoperative day #2.  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  I saw her on 06/04/2023.  She has had some drainage from her chest tube site that resolved.  Her chronic cough worsened initially after surgery but now has returned to baseline.  She did not take gabapentin  because of previous falls while taking that medication.  She is currently using tramadol  once or twice a week. Past Medical History:  Diagnosis Date   Abnormal chest x-ray with multiple lung nodules 08/22/2017   Abnormal weight loss    Age-related osteoporosis without current pathological fracture 03/15/2015   Last Assessment & Plan:  Formatting of this note might be different from the original. Relevant Hx: Course: Daily Update: Today's Plan:discussed in depth with her and she is going to try the boniva when she gets back from her [place] trip, and she was advised to increase her vitamin D 3 to 3000 IU daily after review of her lab and we reviewed her DEXA last year and she will be due for this in 2018  El   Allergy    Alopecia    Anemia    Anxiety    antianxiety med. usually at night but can take during the day for anxiety   Arthralgia of hip 10/26/2019   Arthritis    both knees & hands  also the back & neck   Asthma    Atrial fibrillation (HCC)    pt.  reports that [facility] that she had a slight afib., no further test needed (01/07/14 cardiology note does not mention any afib, just poor anterior r wave progression)   Breast pain, right 08/29/2021   Cataract    Cervical radiculopathy 01/19/2017   Chronic cough    Chronic rhinitis 12/04/2012   Followed in Pulmonary clinic/ [facility]/ [facility]   - sinus ct 10/23/2012 >>> Clear paranasal sinuses. - trial of qid 1st gen H1 02/12/2013 > not effective 05/28/13        Complication of anesthesia    Coronary artery disease involving native coronary artery of native heart without angina pectoris 06/08/2016   Formatting of this note might be different from the original. No longer sees cardiolgist.  ST okay.  Imaging with calcium   Cough variant asthma 06/18/2013   - Methacholine  challenge test 06/03/13 > equivocal but clinically caused tightness and made her cough - hfa 75% p coaching 06/17/13 > Trial of dulera 100 2bid started 06/18/2013     Degenerative cervical disc 06/08/2016   Depression    Diaphragmatic hernia    Dizziness 12/11/2019   Elbow tendonitis    Endometriosis    Gastric polyp    Gastroenteritis    GERD (gastroesophageal reflux disease)    Headache  Hiatal hernia 11/24/2013   High risk medication use 03/15/2015   History of colonic polyps 02/09/2019   History of hiatal hernia    pt. has been told that the hiatal hernia has reappeared   History of melanoma 06/17/2017   History of pneumonia    Hypercholesteremia    Hyperlipidemia    Hypertension    pt. reports that she use to take antihtn med, but reports since weight loss she has been able to come off.     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 Following with [facility].  Stabel.Neg PET scan   Malaise and  fatigue 03/15/2015   Last Assessment & Plan:  Formatting of this note might be different from the original. 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 reports that its severe, but doesn't use it anymore, since weight loss.   Study done in [place], not sure where.    Osteoarthritis    Osteoporosis    PONV (postoperative nausea and vomiting)    Prediabetes 11/18/2019   Primary insomnia 03/15/2015   Primary osteoarthritis involving multiple joints 03/15/2015   Last Assessment & Plan:  Formatting of this note might be different from the original. 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   Restless leg syndrome 11/18/2017   Right kidney mass 05/31/2021   S/P knee replacement 01/29/2014   Seasonal allergic rhinitis due to pollen 03/17/2019   Sinus bradycardia 12/07/2019   Sinus bradycardia by electrocardiogram 12/07/2019   Sleep apnea    does not wear a CPAP   Upper airway cough syndrome 08/04/2012   Followed in Pulmonary clinic/ [facility]/ [facility]  -Kozlow eval around 2012 pos dust/ roach  Neg resp to singulair  - PFT's 02/2010 nl in effort dep portion of f/v loop, lung vol and dlcos also nl  - CT chest 05/27/12 wnl x small HH - 08/04/12   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 - responded to neuronti   Vitamin B 12 deficiency    Vitamin D deficiency     Current Outpatient Medications  Medication Sig Dispense Refill   acetaminophen  (TYLENOL ) 500 MG tablet Take 1,000 mg by mouth every 6 (six) hours as needed for mild pain (pain score 1-3).     albuterol  (VENTOLIN  HFA) 108 (90 Base) MCG/ACT inhaler Inhale 2 puffs into the lungs every 6 (six) hours as needed for shortness of breath or wheezing.  baclofen  (LIORESAL ) 20 MG tablet Take 20 mg by mouth daily.     cetirizine (ZYRTEC) 10 MG tablet Take 10 mg by mouth daily.     Cholecalciferol (VITAMIN D3) 125 MCG (5000 UT) TABS Take 5,000 Units by mouth daily.     dicyclomine  (BENTYL ) 10 MG capsule Take 10 mg by mouth daily as needed (bloating).     famotidine  (PEPCID ) 20 MG tablet Take 1 tablet (20 mg total) by mouth at bedtime. 90 tablet 3   fluconazole (DIFLUCAN) 100 MG tablet Take 100 mg by mouth.     fluticasone  (FLONASE ) 50 MCG/ACT nasal spray Place 2 sprays into both nostrils daily.     fluticasone -salmeterol (WIXELA INHUB) 250-50 MCG/ACT AEPB Inhale 1 puff into the lungs in the morning and at bedtime. 60 each 10   Hydroxychloroquine  Sulfate 300 MG TABS Take 300 mg by mouth daily.     levothyroxine  (SYNTHROID , LEVOTHROID) 88 MCG tablet Take 88 mcg by mouth daily before breakfast.     LORazepam  (ATIVAN ) 1 MG tablet Take 1 mg by mouth at bedtime.  0   Melatonin 5 MG TABS Take 5 mg by mouth at bedtime.     methylPREDNISolone  (MEDROL  DOSEPAK) 4 MG TBPK tablet Take as directed 21 tablet 0   metoprolol  tartrate (LOPRESSOR ) 25 MG tablet Take 1 tablet (25 mg total) by mouth daily. 90 tablet 2   ondansetron  (ZOFRAN ) 4 MG tablet Take 1 tablet (4 mg total) by mouth every 8 (eight) hours as needed for nausea or vomiting. 20 tablet 0   pantoprazole  (PROTONIX ) 20 MG tablet Take 20 mg by mouth daily.      vitamin B-12 (CYANOCOBALAMIN) 500 MCG tablet Take 500 mcg by mouth daily.     traMADol  (ULTRAM ) 50 MG tablet Take 1 tablet (50 mg total) by mouth every 12 (twelve) hours as needed (mild pain). Take with food 20 tablet 0   No current facility-administered medications for this visit.    Physical Exam Ht 5' 2 (1.575 m)   Wt 139 lb (63 kg)   BMI 25.68 kg/m  78 year old woman in no acute distress Alert and oriented x 3 with no focal deficits Lungs clear with equal breath sounds bilaterally Cardiac regular rate and rhythm Incisions healing well  Diagnostic Tests: CHEST - 2 VIEW   COMPARISON:  June 04, 2023.   FINDINGS: The heart size and mediastinal contours are within normal limits. Minimal bibasilar subsegmental atelectasis or scarring is noted. No definite nodular density is noted. The visualized skeletal structures are unremarkable.   IMPRESSION: Minimal bibasilar subsegmental atelectasis or scarring.     Electronically Signed   By: Lynwood Landy Raddle M.D.   On: 07/09/2023 13:58 I personally reviewed the chest x-ray images.  No concerning findings.  Impression: [name] is a 78 year old non-smoker who underwent a right middle lobe wedge resection for a low-grade neuroendocrine (carcinoid) tumor about 2 months ago.  Overall she is doing well.  She is not having any respiratory issues.  She does have some incisional pain and is still using tramadol  occasionally.  Will ahead and gave her prescription for 20 additional tablets she can use up to twice a day as needed for pain.  She may begin driving on a limited basis.  Appropriate precautions were discussed.  She may resume other activities as tolerated.  She had some confusion about when she should see Dr. Ezzard.  I recommended she go ahead and establish an appointment with him, then when she follows  up later down the road he is already familiar with her case.     Plan: Consultation with Dr. Ezzard when appointment can be  arranged Return in 2 months to check on progress.  Will not order chest x-ray unless she is having some respiratory issues at this time.  Theresa JAYSON Millers, MD [facility] [phone]

## 2023-07-17 ENCOUNTER — Ambulatory Visit: Admitting: Podiatry

## 2023-07-21 ENCOUNTER — Ambulatory Visit (HOSPITAL_BASED_OUTPATIENT_CLINIC_OR_DEPARTMENT_OTHER)
Admit: 2023-07-21 | Discharge: 2023-07-21 | Disposition: A | Discharge status: Home / Self Care | Attending: Family Medicine | Admitting: Radiology

## 2023-07-21 ENCOUNTER — Encounter (HOSPITAL_BASED_OUTPATIENT_CLINIC_OR_DEPARTMENT_OTHER): Payer: Self-pay

## 2023-07-21 ENCOUNTER — Ambulatory Visit (HOSPITAL_BASED_OUTPATIENT_CLINIC_OR_DEPARTMENT_OTHER)
Admission: EM | Admit: 2023-07-21 | Discharge: 2023-07-21 | Disposition: A | Discharge status: Home / Self Care | Attending: Family Medicine | Admitting: Family Medicine

## 2023-07-21 DIAGNOSIS — M25571 Pain in right ankle and joints of right foot: Secondary | ICD-10-CM

## 2023-07-21 DIAGNOSIS — S99911A Unspecified injury of right ankle, initial encounter: Secondary | ICD-10-CM

## 2023-07-21 DIAGNOSIS — W230XXA Caught, crushed, jammed, or pinched between moving objects, initial encounter: Secondary | ICD-10-CM

## 2023-07-21 NOTE — ED Triage Notes (Signed)
 Yesterday, was getting in the car and that car door hit her right ankle. Noticed that it was swollen last night.  Hurts to place pressure Tylenol  for pain Just had surgery to remove cancer in R lung

## 2023-07-21 NOTE — Discharge Instructions (Signed)
 X-ray did not show any fractures.  You do have osteopenia in the bone.  Recommend rest, ice, elevate and use Ace wrap for compression.  Follow-up as needed

## 2023-07-24 NOTE — ED Provider Notes (Signed)
 [facility]    CSN: [id number] Arrival date & time: 07/21/23  1024      History   Chief Complaint Chief Complaint  Patient presents with   Fall   Ankle Pain    HPI [name] is a 78 y.o. female.   Patient is a 78 year old female who presents today with right ankle pain, swelling.Yesterday, was getting in the car and the  car door hit her right ankle.Noticed that it was swollen last night. Hurts to place pressure. Tylenol  for pain and applied Ace wrap.     Fall  Ankle Pain   Past Medical History:  Diagnosis Date   Abnormal chest x-ray with multiple lung nodules 08/22/2017   Abnormal weight loss    Age-related osteoporosis without current pathological fracture 03/15/2015   Last Assessment & Plan:  Formatting of this note might be different from the original. Relevant Hx: Course: Daily Update: Today's Plan:discussed in depth with her and she is going to try the boniva when she gets back from her [place] trip, and she was advised to increase her vitamin D 3 to 3000 IU daily after review of her lab and we reviewed her DEXA last year and she will be due for this in 2018  El   Allergy    Alopecia    Anemia    Anxiety    antianxiety med. usually at night but can take during the day for anxiety   Arthralgia of hip 10/26/2019   Arthritis    both knees & hands  also the back & neck   Asthma    Atrial fibrillation (HCC)    pt. reports that [facility] that she had a slight afib., no further test needed (01/07/14 cardiology note does not mention any afib, just poor anterior r wave progression)   Breast pain, right 08/29/2021   Cataract    Cervical radiculopathy 01/19/2017   Chronic cough    Chronic rhinitis 12/04/2012   Followed in Pulmonary clinic/ [facility]/ [place]   - sinus ct 10/23/2012 >>> Clear paranasal sinuses. - trial of qid 1st gen H1 02/12/2013 > not effective 05/28/13        Complication of anesthesia    Coronary artery disease involving native  coronary artery of native heart without angina pectoris 06/08/2016   Formatting of this note might be different from the original. No longer sees cardiolgist.  ST okay.  Imaging with calcium   Cough variant asthma 06/18/2013   - Methacholine  challenge test 06/03/13 > equivocal but clinically caused tightness and made her cough - hfa 75% p coaching 06/17/13 > Trial of dulera 100 2bid started 06/18/2013     Degenerative cervical disc 06/08/2016   Depression    Diaphragmatic hernia    Dizziness 12/11/2019   Elbow tendonitis    Endometriosis    Gastric polyp    Gastroenteritis    GERD (gastroesophageal reflux disease)    Headache    Hiatal hernia 11/24/2013   High risk medication use 03/15/2015   History of colonic polyps 02/09/2019   History of hiatal hernia    pt. has been told that the hiatal hernia has reappeared   History of melanoma 06/17/2017   History of pneumonia    Hypercholesteremia    Hyperlipidemia    Hypertension    pt. reports that she use to take antihtn med, but reports since weight loss she has been able to come off.     Hypothyroidism    Insomnia  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   Laryngopharyngeal reflux (LPR) 07/19/2014   Left lumbar pain 09/19/2020   Lung nodules 09/16/2017   Formatting of this note might be different from the original. Following with [facility].  Stabel.Neg PET scan   Malaise and fatigue 03/15/2015   Last Assessment & Plan:  Formatting of this note might be different from the original. Relevant Hx: Course: Daily Update: Today's Plan:she has some days better than others for her and she admits that with her allergies and asthma at times she is more tired as well as with the OA she has that creates some difficulty  Electronically signed by: Eleanor Merlynn Lady, NP 04/04/15 1058   Melanoma (HCC) 2011   heel   Mixed hyperlipidemia 03/15/2015   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   OSA (obstructive sleep apnea)     does not tolerate CPAP , pt. reports that its severe, but doesn't use it anymore, since weight loss.   Study done in [place], not sure where.    Osteoarthritis    Osteoporosis    PONV (postoperative nausea and vomiting)    Prediabetes 11/18/2019   Primary insomnia 03/15/2015   Primary osteoarthritis involving multiple joints 03/15/2015   Last Assessment & Plan:  Formatting of this note might be different from the original. 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   Restless leg syndrome 11/18/2017   Right kidney mass 05/31/2021   S/P knee replacement 01/29/2014   Seasonal allergic rhinitis due to pollen 03/17/2019   Sinus bradycardia 12/07/2019   Sinus bradycardia by electrocardiogram 12/07/2019   Sleep apnea    does not wear a CPAP   Upper airway cough syndrome 08/04/2012   Followed in Pulmonary clinic/ [facility]/ [place]  -Kozlow eval around 2012 pos dust/ roach  Neg resp to singulair  - PFT's 02/2010 nl in effort dep portion of f/v loop, lung vol and dlcos also nl  - CT chest 05/27/12 wnl x small HH - 08/04/12   Alpha One AT >  MM - informed 09/10/2012 had not received demerol  rx on 09/02/12  - sinus ct 10/23/2012 >>> Clear paranasal sinuses. 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   S/P partial lobectomy of lung 05/03/2023   Breast pain, right 08/29/2021   Caliectasis determined by ultrasound of kidney 06/14/2021   Right kidney mass 05/31/2021   Left lumbar pain 09/19/2020   Enlarged lymph nodes in armpit 05/09/2020   Vitamin B 12 deficiency     PONV (postoperative nausea and vomiting)    Osteoporosis    Osteoarthritis    OSA (obstructive sleep apnea)    Insomnia    Hypertension    Hyperlipidemia    Hypercholesteremia    History of hiatal hernia    Gastroenteritis    Gastric polyp    Endometriosis    Elbow tendonitis    Diaphragmatic hernia    Depression    Asthma    Chronic cough    SVT (supraventricular tachycardia) (HCC)    Arthritis  Anxiety    Alopecia    Abnormal weight loss    Dizziness 12/11/2019   Sinus bradycardia 12/07/2019   Sinus bradycardia by electrocardiogram 12/07/2019   Prediabetes 11/18/2019   Arthralgia of hip 10/26/2019   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   Seasonal allergic rhinitis due to pollen 03/17/2019   History of colonic polyps 02/09/2019   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   Restless leg syndrome 11/18/2017   Lung nodules 09/16/2017   Abnormal chest x-ray with multiple lung nodules 08/22/2017   History of melanoma 06/17/2017   Cervical radiculopathy 01/19/2017   Coronary artery disease involving native coronary artery of native heart without angina pectoris 06/08/2016   Degenerative cervical disc 06/08/2016   Age-related osteoporosis without current pathological fracture 03/15/2015   High risk medication use 03/15/2015   Hypothyroidism 03/15/2015   Malaise and fatigue 03/15/2015   Mixed hyperlipidemia 03/15/2015   Primary insomnia 03/15/2015   Primary osteoarthritis involving multiple joints 03/15/2015   Vitamin D deficiency 03/15/2015   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   Laryngopharyngeal reflux (LPR) 07/19/2014   S/P knee replacement 01/29/2014   GERD (gastroesophageal reflux disease) 11/24/2013   Hiatal hernia 11/24/2013   Cough variant asthma 06/18/2013   Chronic rhinitis 12/04/2012   Upper airway cough syndrome 08/04/2012   S/P Nissen fundoplication (without gastrostomy tube) procedure 05/13/2012   Melanoma (HCC) 2011     Past Surgical History:  Procedure Laterality Date   ABDOMINAL HYSTERECTOMY     APPENDECTOMY     BRONCHIAL BIOPSY  03/26/2023   Procedure: BRONCHOSCOPY, WITH BIOPSY;  Surgeon: Shelah Lamar RAMAN, MD;  Location: [facility] ENDOSCOPY;  Service: Pulmonary;;   BRONCHIAL BRUSHINGS  03/26/2023   Procedure: BRONCHOSCOPY, WITH BRUSH BIOPSY;  Surgeon: Shelah Lamar RAMAN, MD;  Location: [facility] ENDOSCOPY;  Service: Pulmonary;;   BRONCHIAL NEEDLE ASPIRATION BIOPSY  03/26/2023   Procedure: BRONCHOSCOPY, WITH NEEDLE ASPIRATION BIOPSY;  Surgeon: Shelah Lamar RAMAN, MD;  Location: [facility] ENDOSCOPY;  Service: Pulmonary;;   CHOLECYSTECTOMY     COLONOSCOPY  05/09/2015   Colonic Polyps, moderate sigmoid diverticulosis. Bx: Tubular Adenomas. Negative   DILATION AND CURETTAGE, DIAGNOSTIC / THERAPEUTIC     ESOPHAGEAL DILATION  08/2016   EYE SURGERY     cataracts removed / w IOL   FUDUCIAL PLACEMENT  03/26/2023   Procedure: INSERTION, FIDUCIAL MARKER, GOLD;  Surgeon: Shelah Lamar RAMAN, MD;  Location: [facility] ENDOSCOPY;  Service: Pulmonary;;   GALLBLADDER SURGERY     HERNIA REPAIR     HIATAL HERNIA REPAIR  2/14   HIATAL HERNIA REPAIR  2014   revision of 2018 in [place] NERVE BLOCK Right 05/03/2023   Procedure: BLOCK, NERVE, INTERCOSTAL;  Surgeon: Kerrin Elspeth BROCKS, MD;  Location: [facility] OR;  Service: Thoracic;  Laterality: Right;   KNEE ARTHROSCOPY     both knees for torn cartilage    LYMPH NODE BIOPSY Right 05/03/2023   Procedure: LYMPH NODE BIOPSY;  Surgeon: Kerrin Elspeth BROCKS, MD;  Location[facility] OR;  Service: Thoracic;  Laterality: Right;   NERVE REPAIR Left 07/11/2017   Procedure: Excision of saphenous nerve left knee. Right knee intra-articular injection.;  Surgeon: Ernie Cough, MD;  Location: [facility] ORS;  Service: Orthopedics;  Laterality: Left;  60 mins   OOPHORECTOMY     TONSILLECTOMY     TOTAL KNEE ARTHROPLASTY Left 01/29/2014   Procedure: LEFT TOTAL KNEE ARTHROPLASTY MCL REPAIR;  Surgeon: Elspeth JONELLE Her, MD;   Location: [facility] OR;  Service: Orthopedics;  Laterality:  Left;   VESICOVAGINAL FISTULA CLOSURE W/ TAH     VIDEO BRONCHOSCOPY WITH ENDOBRONCHIAL NAVIGATION Right 03/26/2023   Procedure: VIDEO BRONCHOSCOPY WITH ENDOBRONCHIAL NAVIGATION;  Surgeon: Shelah Lamar RAMAN, MD;  Location: [facility] ENDOSCOPY;  Service: Pulmonary;  Laterality: Right;  WITH FLUORO WITH BIOPSY   WEDGE RESECTION, LUNG, ROBOT-ASSISTED, THORACOSCOPIC Right 05/03/2023   Procedure: WEDGE RESECTION, LUNG, ROBOT-ASSISTED, THORACOSCOPIC;  Surgeon: Kerrin Elspeth BROCKS, MD;  Location: [facility] OR;  Service: Thoracic;  Laterality: Right;  RIGHT MIDDLE LOBE WEDGE RESECTION    OB History   No obstetric history on file.      Home Medications    Prior to Admission medications   Medication Sig Start Date End Date Taking? Authorizing Provider  acetaminophen  (TYLENOL ) 500 MG tablet Take 1,000 mg by mouth every 6 (six) hours as needed for mild pain (pain score 1-3).    [provider]  albuterol  (VENTOLIN  HFA) 108 (90 Base) MCG/ACT inhaler Inhale 2 puffs into the lungs every 6 (six) hours as needed for shortness of breath or wheezing.    [provider]  baclofen  (LIORESAL ) 20 MG tablet Take 20 mg by mouth daily.    [provider]  cetirizine (ZYRTEC) 10 MG tablet Take 10 mg by mouth daily.    [provider]  Cholecalciferol (VITAMIN D3) 125 MCG (5000 UT) TABS Take 5,000 Units by mouth daily.    [provider]  dicyclomine  (BENTYL ) 10 MG capsule Take 10 mg by mouth daily as needed (bloating).    [provider]  famotidine  (PEPCID ) 20 MG tablet Take 1 tablet (20 mg total) by mouth at bedtime. 06/16/21   Charlanne Groom, MD  fluticasone  (FLONASE ) 50 MCG/ACT nasal spray Place 2 sprays into both nostrils daily. 07/25/20   [provider]  fluticasone -salmeterol (WIXELA INHUB) 250-50 MCG/ACT AEPB Inhale 1 puff into the lungs in the morning and at bedtime. 10/08/22   Mannam, Praveen, MD   Hydroxychloroquine  Sulfate 300 MG TABS Take 300 mg by mouth daily. 08/26/22   [provider]  levothyroxine  (SYNTHROID , LEVOTHROID) 88 MCG tablet Take 88 mcg by mouth daily before breakfast.    [provider]  LORazepam  (ATIVAN ) 1 MG tablet Take 1 mg by mouth at bedtime.    [provider]  Melatonin 5 MG TABS Take 5 mg by mouth at bedtime.    [provider]  methylPREDNISolone  (MEDROL  DOSEPAK) 4 MG TBPK tablet Take as directed 06/26/23   McCaughan, Dia D, DPM  metoprolol  tartrate (LOPRESSOR ) 25 MG tablet Take 1 tablet (25 mg total) by mouth daily. 12/21/22   Carlin Delon BROCKS, NP  ondansetron  (ZOFRAN ) 4 MG tablet Take 1 tablet (4 mg total) by mouth every 8 (eight) hours as needed for nausea or vomiting. 05/05/23   Barrett, Erin R, PA-C  pantoprazole  (PROTONIX ) 20 MG tablet Take 20 mg by mouth daily.    [provider]  traMADol  (ULTRAM ) 50 MG tablet Take 1 tablet (50 mg total) by mouth every 12 (twelve) hours as needed (mild pain). Take with food 07/09/23   Kerrin Elspeth BROCKS, MD  vitamin B-12 (CYANOCOBALAMIN) 500 MCG tablet Take 500 mcg by mouth daily.    [provider]    Family History Family History  Problem Relation Age of Onset   Emphysema Father        smoked   Heart disease Father    Arthritis Father    Asthma Mother    Colon cancer Mother    Arthritis Mother  Asthma Brother    Arthritis Brother    Breast cancer Sister    Breast cancer Maternal Grandmother    Heart disease Maternal Grandmother    Heart disease Paternal Grandmother    Heart disease Paternal Aunt     Social History Social History   Tobacco Use   Smoking status: Never    Passive exposure: Past   Smokeless tobacco: Never  Vaping Use   Vaping status: Never Used  Substance Use Topics   Alcohol use: Not Currently   Drug use: No     Allergies   Iodinated contrast media, Neurontin  [gabapentin ], Ciprofloxacin, Codeine , Egg-derived products, Misc.  sulfonamide containing compounds, Omnicef [cefdinir], Other, Oxycodone , Tramadol , Cyclobenzaprine, and Sulfa antibiotics   Review of Systems Review of Systems  See HPI Physical Exam Triage Vital Signs ED Triage Vitals  Encounter Vitals Group     BP 07/21/23 1037 115/70     Girls Systolic BP Percentile --      Girls Diastolic BP Percentile --      Boys Systolic BP Percentile --      Boys Diastolic BP Percentile --      Pulse Rate 07/21/23 1037 61     Resp 07/21/23 1037 18     Temp 07/21/23 1037 (!) 97.4 F (36.3 C)     Temp Source 07/21/23 1037 Oral     SpO2 07/21/23 1037 95 %     Weight --      Height --      Head Circumference --      Peak Flow --      Pain Score 07/21/23 1035 9     Pain Loc --      Pain Education --      Exclude from Growth Chart --    No data found.  Updated Vital Signs BP 115/70 (BP Location: Left Arm)   Pulse 61   Temp (!) 97.4 F (36.3 C) (Oral)   Resp 18   SpO2 95%   Visual Acuity Right Eye Distance:   Left Eye Distance:   Bilateral Distance:    Right Eye Near:   Left Eye Near:    Bilateral Near:     Physical Exam Vitals and nursing note reviewed.  Constitutional:      General: She is not in acute distress.    Appearance: Normal appearance. She is not ill-appearing, toxic-appearing or diaphoretic.  Pulmonary:     Effort: Pulmonary effort is normal.  Musculoskeletal:        General: Swelling and tenderness present.     Comments: Swelling and tenderness to left lateral malleolus area no obvious bruising  Skin:    General: Skin is warm and dry.  Neurological:     Mental Status: She is alert.  Psychiatric:        Mood and Affect: Mood normal.      UC Treatments / Results  Labs (all labs ordered are listed, but only abnormal results are displayed) Labs Reviewed - No data to display  EKG   Radiology No results found.  Procedures Procedures (including critical care time)  Medications Ordered in UC Medications - No  data to display  Initial Impression / Assessment and Plan / UC Course  I have reviewed the triage vital signs and the nursing notes.  Pertinent labs & imaging results that were available during my care of the patient were reviewed by me and considered in my medical decision making (see chart for details).     \  Right ankle injury-x-ray without any acute fractures or other concerns.  She does have osteopenia in the bone which puts her high risk for fracture.  Patient made aware of this.  Recommend follow-up with her doctor for bone density.  Also recommend rest, ice, elevate and Ace wrap for compression Follow-up as needed Final Clinical Impressions(s) / UC Diagnoses   Final diagnoses:  Injury of right ankle, initial encounter     Discharge Instructions      X-ray did not show any fractures.  You do have osteopenia in the bone.  Recommend rest, ice, elevate and use Ace wrap for compression.  Follow-up as needed    ED Prescriptions   None    PDMP not reviewed this encounter.   Adah Wilbert LABOR, FNP 07/24/23 [phone]

## 2023-07-25 ENCOUNTER — Telehealth: Payer: Self-pay | Admitting: Podiatry

## 2023-07-25 NOTE — Telephone Encounter (Signed)
 Patient states a car door hit her right ankle and she is in pain. Also having a plantar fasciitis flare up. She asks can she take the prednisone  to help with Pain?

## 2023-07-29 NOTE — Telephone Encounter (Signed)
 Spoke to patient is doing better will continue to ice and will call for an appointment if needed.

## 2023-08-06 NOTE — Telephone Encounter (Signed)
 Copied from CRM #[id number]. Topic: Schedule Appointment - Schedule Patient >> Aug 06, 2023 10:41 AM Florene CROME wrote: Boddy, Shalen Juanita is calling other request    Include all details related to the request(s) below:  Patient is calling requesting to be seen today or tomorrow forpossible UTI .   Confirm and type the Best Contact Number below:  Patient/caller contact number:  [phone]           [] Home  [x] Mobile  [] Work [] Other   [] Okay to leave a voicemail   Medication List:  Current Outpatient Medications:  .  baclofen  (LIORESAL ) 20 mg tablet, Take 1 tablet by mouth nightly. And in afternoon if needed, Disp: , Rfl:  .  CALCIUM 26-VIT D3-MAGNESIUM 15 ORAL, Take 1 tablet by mouth nightly. Also with zinc.  On hold  1/21 for surgery 01/28/23, Disp: , Rfl:  .  cetirizine (ZyrTEC) 5 mg tablet, Take 5 mg by mouth daily., Disp: , Rfl:  .  cholecalciferol (VITAMIN D3) 5,000 unit (125 mcg) tab tablet, Take 5,000 Units by mouth daily. On hold  1/21 for surgery 01/28/23, Disp: , Rfl:  .  cyanocobalamin (VITAMIN B12) 250 mcg tablet, Take 250 mcg by mouth daily. On hold  1/21 for surgery 01/28/23, Disp: , Rfl:  .  dicyclomine  (BENTYL ) 10 mg capsule, Take 2 capsules (20 mg total) by mouth 3 (three) times a day., Disp: 180 capsule, Rfl: 3 .  famotidine  (PEPCID ) 20 mg tablet, Take 1 tablet (20 mg total) by mouth nightly., Disp: 90 tablet, Rfl: 3 .  fluticasone  propion-salmeteroL (ADVAIR DISKUS) 250-50 mcg/dose diskus inhaler, Inhale 1 puff  in the morning and 1 puff before bedtime., Disp: , Rfl:  .  fluticasone  propionate (FLONASE ) 50 mcg/spray nasal spray, Administer 1 spray into each nostril daily., Disp: , Rfl:  .  hydroxychloroquine  (PLAQUENIL ) 300 mg tab tablet, Take 1 tablet (300 mg total) by mouth daily., Disp: 90 tablet, Rfl: 3 .  ibuprofen (MOTRIN) 200 mg tablet, Take 200 mg by mouth every 8 (eight) hours as needed. On hold  1/21 for surgery 01/28/23, Disp: , Rfl:  .  levothyroxine   (SYNTHROID ) 88 mcg tablet, Take 1 tablet (88 mcg total) by mouth daily., Disp: 90 tablet, Rfl: 3 .  LORazepam  (ATIVAN ) 1 mg tablet, Take 1 tablet every 8 hrs as needed for anxiety or sleep, Disp: 30 tablet, Rfl: 1 .  melatonin tablet, Take 5 mg by mouth nightly., Disp: , Rfl:  .  methylPREDNISolone  (MEDROL  DOSEPAK) 4 mg 6 day dose pack, , Disp: , Rfl:  .  metoprolol  tartrate (LOPRESSOR ) 25 mg tablet, Take 25 mg by mouth Once Daily., Disp: , Rfl:  .  pantoprazole  (PROTONIX ) 20 mg EC tablet, Take 1 tablet (20 mg total) by mouth daily., Disp: 90 tablet, Rfl: 3 .  promethazine  (PHENERGAN ) 12.5 mg tablet, May take one every 8 hours as needed for nausea, Disp: 30 tablet, Rfl: 2 .  promethazine -dextromethorphan  (PHENERGAN  DM) 6.25-15 mg/5 mL syrp syrup, Take 5 mL by mouth every 6 (six) hours as needed (cough)., Disp: 120 mL, Rfl: 1 .  traMADoL  (ULTRAM ) 50 mg tablet, Take 50 mg by mouth every 6 (six) hours as needed for moderate pain (4-6)., Disp: , Rfl:  .  valACYclovir (VALTREX) 500 mg tablet, Take 1 tablet (500 mg total) by mouth 2 (two) times a day Indications: herpes simplex infection., Disp: 10 tablet, Rfl: 2 .  Ventolin  HFA 90 mcg/actuation inhaler, Inhale 2 puffs every 4 (four) hours as needed., Disp: ,  Rfl:      Medication Request/Refills: Pharmacy Information (if applicable)   [] Not Applicable       []  Pharmacy listed  Send Medication Request to:                                                 [] Pharmacy not listed (added to pharmacy list in Epic) Send Medication Request to:      Listed Pharmacies: [facility] - [place], [place] - [address] - PHONE: [phone] - FAX: [phone] [id number] - [place], [place] - [address] AT [address] - PHONE: [phone] - FAX: [phone] [id number] [place], [place] - [address] - PHONE: [phone] - FAX: [phone] [id number] - [place], [place] - [address] - PHONE: [phone] - FAX: [phone]

## 2023-08-07 NOTE — Progress Notes (Signed)
 [name] is a 78 y.o.female.  Subjective:   [name] is a 78 year old female who presents today with urinary frequency and urgency.  She denies any fevers or chills.  She has not had any chest pains or any shortness of breath.  She has not been nauseated.  She is also having issues with nerve pain going down the right side of her back radiating down to her legs.  She is also reporting that her right ear feels stopped up.  Physical examination did reveal bulging tympanic membrane to her right ear.  Plan:    She got a Kenalog 60 mg IM injection for the ears as well as her sciatica.  I sent in Macrobid 100 mg twice a day for 10 days.  I did obtain a urine culture and pending those results will determine further plan of care.  She will follow-up as needed.  Today I have evaluated and managed two or more of this patient's chronic stable/unstable health problems.  Today I have reviewed and managed this patient's medications related to their health problems and/or I have prescribed or renewed prescriptions as necessary for health problems.  _____________________________________  Diagnoses this Encounter 1. Urinary frequency   2. Tympanic membrane irritation, right   3. Acute cystitis without hematuria    Chief Complaint  Patient presents with  . Urinary Tract Infection   Patient's Medications  New Prescriptions   NITROFURANTOIN, MACROCRYSTAL-MONOHYDRATE, (MACROBID) 100 MG CAPSULE    Take 1 capsule (100 mg total) by mouth 2 (two) times a day for 5 days.  Previous Medications   BACLOFEN  (LIORESAL ) 20 MG TABLET    Take 1 tablet by mouth nightly. And in afternoon if needed   CALCIUM 26-VIT D3-MAGNESIUM 15 ORAL    Take 1 tablet by mouth nightly. Also with zinc.  On hold  1/21 for surgery 01/28/23   CETIRIZINE (ZYRTEC) 5 MG TABLET    Take 5 mg by mouth daily.   CHOLECALCIFEROL (VITAMIN D3) 5,000 UNIT (125 MCG) TAB TABLET    Take 5,000 Units by mouth daily. On hold  1/21 for surgery 01/28/23    CYANOCOBALAMIN (VITAMIN B12) 250 MCG TABLET    Take 250 mcg by mouth daily. On hold  1/21 for surgery 01/28/23   DICYCLOMINE  (BENTYL ) 10 MG CAPSULE    Take 2 capsules (20 mg total) by mouth 3 (three) times a day.   FAMOTIDINE  (PEPCID ) 20 MG TABLET    Take 1 tablet (20 mg total) by mouth nightly.   FLUTICASONE  PROPION-SALMETEROL (ADVAIR DISKUS) 250-50 MCG/DOSE DISKUS INHALER    Inhale 1 puff  in the morning and 1 puff before bedtime.   FLUTICASONE  PROPIONATE (FLONASE ) 50 MCG/SPRAY NASAL SPRAY    Administer 1 spray into each nostril daily.   HYDROXYCHLOROQUINE  (PLAQUENIL ) 300 MG TAB TABLET    Take 1 tablet (300 mg total) by mouth daily.   IBUPROFEN (MOTRIN) 200 MG TABLET    Take 200 mg by mouth every 8 (eight) hours as needed. On hold  1/21 for surgery 01/28/23   LEVOTHYROXINE  (SYNTHROID ) 88 MCG TABLET    Take 1 tablet (88 mcg total) by mouth daily.   LORAZEPAM  (ATIVAN ) 1 MG TABLET    Take 1 tablet every 8 hrs as needed for anxiety or sleep   MELATONIN TABLET    Take 5 mg by mouth nightly.   METHYLPREDNISOLONE  (MEDROL  DOSEPAK) 4 MG 6 DAY DOSE PACK       METOPROLOL  TARTRATE (LOPRESSOR ) 25 MG TABLET  Take 25 mg by mouth Once Daily.   PANTOPRAZOLE  (PROTONIX ) 20 MG EC TABLET    Take 1 tablet (20 mg total) by mouth daily.   PROMETHAZINE  (PHENERGAN ) 12.5 MG TABLET    May take one every 8 hours as needed for nausea   PROMETHAZINE -DEXTROMETHORPHAN  (PHENERGAN  DM) 6.25-15 MG/5 ML SYRP SYRUP    Take 5 mL by mouth every 6 (six) hours as needed (cough).   TRAMADOL  (ULTRAM ) 50 MG TABLET    Take 50 mg by mouth every 6 (six) hours as needed for moderate pain (4-6).   VALACYCLOVIR (VALTREX) 500 MG TABLET    Take 1 tablet (500 mg total) by mouth 2 (two) times a day Indications: herpes simplex infection.   VENTOLIN  HFA 90 MCG/ACTUATION INHALER    Inhale 2 puffs every 4 (four) hours as needed.  Modified Medications   No medications on file  Discontinued Medications   No medications on file   Orders Placed This  Encounter  Procedures  . Urine Culture  . POC Urinalysis Auto without Microscopic   Physical Exam Vitals and nursing note reviewed.  Constitutional:      Appearance: Normal appearance.  HENT:     Head: Normocephalic and atraumatic.     Right Ear: Tympanic membrane normal.     Left Ear: Tympanic membrane normal.     Nose: Nose normal.     Mouth/Throat:     Mouth: Mucous membranes are moist.     Pharynx: Oropharynx is clear.   Eyes:     Pupils: Pupils are equal, round, and reactive to light.    Cardiovascular:     Rate and Rhythm: Normal rate.     Pulses: Normal pulses.  Pulmonary:     Effort: Pulmonary effort is normal.  Abdominal:     General: Bowel sounds are normal.     Palpations: Abdomen is soft.   Musculoskeletal:        General: Normal range of motion.     Cervical back: Normal range of motion.   Skin:    General: Skin is warm and dry.   Neurological:     General: No focal deficit present.     Mental Status: She is alert.     Cranial Nerves: Cranial nerves 2-12 are intact.     Sensory: Sensation is intact.     Motor: Motor function is intact.     Coordination: Coordination is intact.     Gait: Gait is intact.   Psychiatric:        Attention and Perception: Attention and perception normal.        Mood and Affect: Mood normal.        Speech: Speech normal.        Behavior: Behavior normal.        Thought Content: Thought content normal.        Cognition and Memory: Cognition and memory normal.        Judgment: Judgment normal.     _____________________ Vitals:   08/07/23 1108  BP: 122/78  BP Location: Right arm  Patient Position: Sitting  Pulse: 88  Resp: 19  Temp: 97.1 F (36.2 C)  SpO2: 97%  Weight: 64.4 kg (142 lb)  Height: 1.626 m (5' 4)   Body mass index is 24.37 kg/m.  Problem List[1] Medical History[2] Surgical History[3] Family History[4] Social History   Socioeconomic History  . Marital status: Married    Spouse name:  Not on file  . Number of  children: Not on file  . Years of education: Not on file  . Highest education level: Not on file  Occupational History  . Not on file  Tobacco Use  . Smoking status: Never  . Smokeless tobacco: Never  Vaping Use  . Vaping status: Never Used  Substance and Sexual Activity  . Alcohol use: No  . Drug use: No    Comment: Drug use: Denies  . Sexual activity: Not on file  Other Topics Concern  . Not on file  Social History Narrative  . Not on file   Social Drivers of Health   Food Insecurity: No Food Insecurity (05/03/2023)   Received from [facility]   Food vital sign   . Within the past 12 months, you worried that your food would run out before you got money to buy more: Never true   . Within the past 12 months, the food you bought just didn't last and you didn't have money to get more: Never true  Transportation Needs: No Transportation Needs (05/03/2023)   Received from [facility] - Transportation   . Lack of Transportation (Medical): No   . Lack of Transportation (Non-Medical): No  Safety: Patient Unable To Answer (05/03/2023)   Received from [facility]   Safety   . Within the last year, have you been afraid of your partner or ex-partner?: Patient unable to answer   . Within the last year, have you been humiliated or emotionally abused in other ways by your partner or ex-partner?: Patient unable to answer   . Within the last year, have you been kicked, hit, slapped, or otherwise physically hurt by your partner or ex-partner?: Patient unable to answer   . Within the last year, have you been raped or forced to have any kind of sexual activity by your partner or ex-partner?: Patient unable to answer  Living Situation: Low Risk  (05/03/2023)   Received from [facility] Situation   . In the last 12 months, was there a time when you were not able to pay the mortgage or rent on time?: No   . In the past 12 months, how many times have you moved  where you were living?: 0   . At any time in the past 12 months, were you homeless or living in a shelter (including now)?: No   Allergies[5]  Review of Systems  Some components of the Review of Systems, Social History, Past Medical History, Family History, and Vital Signs were taken from the patient and documented by the medical assistant assisting the provider today.  Electronically signed by: Janey Sprung, CMA 08/07/2023 11:06 AM       [1] Patient Active Problem List Diagnosis  . Hiatal hernia  . Gastroesophageal reflux disease without esophagitis  . S/P Nissen fundoplication (without gastrostomy tube) procedure  . Mixed hyperlipidemia  . Hypothyroidism  . Primary insomnia  . Primary osteoarthritis involving multiple joints  . Age-related osteoporosis without current pathological fracture  . Vitamin B 12 deficiency  . Vitamin D deficiency  . Coronary artery disease involving native coronary artery of native heart without angina pectoris  . Degenerative cervical disc  . Cervical radiculopathy  . Pulmonary nodules  . History of melanoma  . Laryngopharyngeal reflux  . Restless leg syndrome  . Moderate depressive disorder  . Personal history of colonic polyps  . Seasonal allergic rhinitis due to pollen  . Irritable bowel syndrome with diarrhea  . Arthralgia  of hip  . Prediabetes  . Degenerative disc disease, lumbar  . Right kidney mass  . Caliectasis determined by ultrasound of kidney  . Spinal stenosis of lumbar region with neurogenic claudication  . Hx of malignant carcinoid tumor  . Abdominal bloating  . S/P partial lobectomy of lung  [2] Past Medical History: Diagnosis Date  . Abnormal electrocardiogram   . Allergic rhinitis   . Alopecia   . Anxiety   . Asthma (CMD)   . Colon polyps   . Depression   . Diaphragmatic hernia   . Elbow tendonitis   . GERD (gastroesophageal reflux disease)   . Hirsutism   . History of fall   . Hypercholesterolemia   .  Hypertension   . IBS (irritable bowel syndrome)   . Insomnia   . Lung nodules    seen on CT scan 01/2023  . Malaise and fatigue   . Osteoarthritis   . Osteoporosis   . Otitis media   . Palpitations   . Plantar fasciitis   . PONV (postoperative nausea and vomiting)   . PVC (premature ventricular contraction)   . Senile purpura   . Sleep apnea   . Thyroid disease   . Transient synovitis   . Vitamin B12 deficiency   . Vitamin D deficiency   . Wears glasses   [3] Past Surgical History: Procedure Laterality Date  . APPENDECTOMY  age 59 y  . CATARACT EXTRACTION, BILATERAL    . CHOLECYSTECTOMY     Procedure: CHOLECYSTECTOMY 2022  . COLONOSCOPY     Procedure: COLONOSCOPY  . DILATION AND CURETTAGE OF UTERUS  1994  . DILATION AND CURETTAGE, DIAGNOSTIC / THERAPEUTIC     Procedure: DILATION AND CURETTAGE, DIAGNOSTIC / THERAPEUTIC  . ESOPHAGOGASTRODUODENOSCOPY     Procedure: ESOPHAGOGASTRODUODENOSCOPY  . ESOPHAGOGASTRODUODENOSCOPY N/A 11/23/2014   Procedure: EGD;  Surgeon: Oneil Vicenta Claudene PONCE, MD;  Location: [facility] ENDO OR;  Service: Gastroenterology;  Laterality: N/A;  . EXPLORATORY LAPAROTOMY     Procedure: EXPLORATORY LAPAROTOMY  . HERNIA REPAIR     hiatal  . HYSTERECTOMY   1994  . KNEE SURGERY  RT x 2 and LT    1994; 2002;  . MELANOMA EXCISION FOOT Left    heel  . OTHER SURGICAL HISTORY     Procedure: OTHER SURGICAL HISTORY (nissen)  . TONSILLECTOMY  age 16 y  . TOTAL KNEE ARTHROPLASTY  LT--2016  . TUBAL LIGATION  1990's  [4] Family History Problem Relation Name Age of Onset  . Arthritis Mother    . Asthma Mother    . Cancer Mother    . Arthritis Father    . COPD Father    . Heart disease Father    . Cancer Sister    . Arthritis Brother    . Asthma Brother    . Cancer Maternal Aunt    . Cancer Paternal Aunt    . Early death Paternal Aunt    . Heart disease Paternal Aunt    . Cancer Maternal Grandmother    . Heart disease Maternal Grandmother    . Heart disease  Paternal Grandmother    . Rheum arthritis Neg Hx    . Lupus Neg Hx    . Ankylosing spondylitis Neg Hx    . Psoriasis Neg Hx    . Gout Neg Hx    [5] Allergies Allergen Reactions  . Gabapentin  Dizziness and Other (See Comments)    Severe falls   . Iodinated Contrast Media  Rash  . Cefdinir Diarrhea  . Ciprofloxacin Diarrhea    diarrhea  . Codeine  GI Intolerance and Other (See Comments)    Pt states causes nightmares  . Oxycodone  GI Intolerance  . Tramadol  Other (See Comments)    Stomach aches  . Cyclobenzaprine GI Intolerance and Palpitations  . Egg Rash    GI upset per patient  . Sulfamethoxazole Rash

## 2023-08-08 NOTE — Progress Notes (Signed)
 Negative urine culture

## 2023-08-11 NOTE — Progress Notes (Signed)
 [facility] at [facility] [address] [place],  [place]  [address] [phone]  Clinic Day: 08/12/2023  Referring physician: Thurmond Cathlyn LABOR., MD   HISTORY OF PRESENT ILLNESS:  The patient is a 78 y.o. female  who I was asked to consult upon for newly diagnosed well-differentiated carcinoid of the lung.  Her history dates back to 2019, when scans at that time initially showed a small mass in her right middle lobe.  Over the span of multiple years, this lesion showed mild enlargement, including in March 2025, when this lesion measured 7 x 8 mm per a chest CT.  A PET scan done shortly thereafter did not show any obvious hypermetabolic activity.  Nevertheless, as this lesion did grow mildly larger in size over the years, it was ultimately decided for this lesion to undergo a wedge resection.  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  5 regional lymph nodes were removed for which none contained cancer.  She comes in today to go over her pathologic results and their implications.  The patient denies ever having smoked.  She also denies having any significant weight loss over this calendar year.  Her main problem postsurgically has been discomfort in her right posterior rib cage from where her surgery was performed.  PAST MEDICAL HISTORY:   Past Medical History:  Diagnosis Date   Abnormal chest x-ray with multiple lung nodules 08/22/2017   Abnormal weight loss    Age-related osteoporosis without current pathological fracture 03/15/2015   Last Assessment & Plan:  Formatting of this note might be different from the original. Relevant Hx: Course: Daily Update: Today's Plan:discussed in depth with her and she is going to try the boniva when she gets back from her [place] trip, and she was advised to increase her vitamin D 3 to 3000 IU daily after review of her lab and we reviewed  her DEXA last year and she will be due for this in 2018  El   Allergy    Alopecia    Anemia    Anxiety    antianxiety med. usually at night but can take during the day for anxiety   Arthralgia of hip 10/26/2019   Arthritis    both knees & hands  also the back & neck   Asthma    Atrial fibrillation (HCC)    pt. reports that [facility] that she had a slight afib., no further test needed (01/07/14 cardiology note does not mention any afib, just poor anterior r wave progression)   Breast pain, right 08/29/2021   Cancer (HCC)    Cataract    Cervical radiculopathy 01/19/2017   Chronic cough    Chronic rhinitis 12/04/2012   Followed in Pulmonary clinic/ [facility]/ [facility]   - sinus ct 10/23/2012 >>> Clear paranasal sinuses. - trial of qid 1st gen H1 02/12/2013 > not effective 05/28/13        Complication of anesthesia    Coronary artery disease involving native coronary artery of native heart without angina pectoris 06/08/2016   Formatting of this note might be different from the original. No longer sees cardiolgist.  ST okay.  Imaging with calcium   Cough variant asthma 06/18/2013   - Methacholine  challenge test 06/03/13 > equivocal but clinically caused tightness and made her cough - hfa 75% p coaching 06/17/13 > Trial of dulera 100 2bid started 06/18/2013  Degenerative cervical disc 06/08/2016   Depression    Diaphragmatic hernia    Dizziness 12/11/2019   Elbow tendonitis    Endometriosis    Gastric polyp    Gastroenteritis    GERD (gastroesophageal reflux disease)    Headache    Hiatal hernia 11/24/2013   High risk medication use 03/15/2015   History of colonic polyps 02/09/2019   History of hiatal hernia    pt. has been told that the hiatal hernia has reappeared   History of melanoma 06/17/2017   History of pneumonia    Hypercholesteremia    Hyperlipidemia    Hypertension    pt. reports that she use to take antihtn med, but reports since weight loss she has been able to  come off.     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 Following with [facility].  Stabel.Neg PET scan   Malaise and fatigue 03/15/2015   Last Assessment & Plan:  Formatting of this note might be different from the original. 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 reports that its severe, but doesn't use it anymore, since weight loss.   Study done in [place], not sure where.    Osteoarthritis    Osteoporosis    PONV (postoperative nausea and vomiting)    Prediabetes 11/18/2019   Primary insomnia 03/15/2015   Primary osteoarthritis involving multiple joints 03/15/2015   Last Assessment & Plan:  Formatting of this note might be different from the original. 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   Restless leg syndrome 11/18/2017   Right kidney mass 05/31/2021   S/P knee replacement 01/29/2014   Seasonal allergic rhinitis due to pollen 03/17/2019   Sinus bradycardia 12/07/2019   Sinus bradycardia by electrocardiogram 12/07/2019    Sleep apnea    does not wear a CPAP   Upper airway cough syndrome 08/04/2012   Followed in Pulmonary clinic/ [facility]/ [facility]  -Kozlow eval around 2012 pos dust/ roach  Neg resp to singulair  - PFT's 02/2010 nl in effort dep portion of f/v loop, lung vol and dlcos also nl  - CT chest 05/27/12 wnl x small HH - 08/04/12   [facility] AT >  MM - informed 09/10/2012 had not received demerol  rx on 09/02/12  - sinus ct 10/23/2012 >>> Clear paranasal sinuses. - responded to neuronti   Vitamin B 12 deficiency    Vitamin D deficiency     PAST SURGICAL HISTORY:   Past Surgical History:  Procedure Laterality Date   ABDOMINAL  HYSTERECTOMY     APPENDECTOMY     BRONCHIAL BIOPSY  03/26/2023   Procedure: BRONCHOSCOPY, WITH BIOPSY;  Surgeon: Shelah Lamar RAMAN, MD;  Location: [facility] ENDOSCOPY;  Service: Pulmonary;;   BRONCHIAL BRUSHINGS  03/26/2023   Procedure: BRONCHOSCOPY, WITH BRUSH BIOPSY;  Surgeon: Shelah Lamar RAMAN, MD;  Location: [facility] ENDOSCOPY;  Service: Pulmonary;;   BRONCHIAL NEEDLE ASPIRATION BIOPSY  03/26/2023   Procedure: BRONCHOSCOPY, WITH NEEDLE ASPIRATION BIOPSY;  Surgeon: Shelah Lamar RAMAN, MD;  Location: [facility] ENDOSCOPY;  Service: Pulmonary;;   CHOLECYSTECTOMY     COLONOSCOPY  05/09/2015   Colonic Polyps, moderate sigmoid diverticulosis. Bx: Tubular Adenomas. Negative   DILATION AND CURETTAGE, DIAGNOSTIC / THERAPEUTIC     ESOPHAGEAL DILATION  08/2016   EYE SURGERY     cataracts removed / w IOL   FUDUCIAL PLACEMENT  03/26/2023   Procedure: INSERTION, FIDUCIAL MARKER, GOLD;  Surgeon: Shelah Lamar RAMAN, MD;  Location: [facility] ENDOSCOPY;  Service: Pulmonary;;   GALLBLADDER SURGERY     HERNIA REPAIR     HIATAL HERNIA REPAIR  2/14   HIATAL HERNIA REPAIR  2014   revision of 2018 in [place] NERVE BLOCK Right 05/03/2023   Procedure: BLOCK, NERVE, INTERCOSTAL;  Surgeon: Kerrin Elspeth BROCKS, MD;  Location: [facility] OR;  Service: Thoracic;  Laterality: Right;   KNEE ARTHROSCOPY     both knees for torn  cartilage    LYMPH NODE BIOPSY Right 05/03/2023   Procedure: LYMPH NODE BIOPSY;  Surgeon: Kerrin Elspeth BROCKS, MD;  Location[facility] OR;  Service: Thoracic;  Laterality: Right;   NERVE REPAIR Left 07/11/2017   Procedure: Excision of saphenous nerve left knee. Right knee intra-articular injection.;  Surgeon: Ernie Cough, MD;  Location: [facility] ORS;  Service: Orthopedics;  Laterality: Left;  60 mins   OOPHORECTOMY     TONSILLECTOMY     TOTAL KNEE ARTHROPLASTY Left 01/29/2014   Procedure: LEFT TOTAL KNEE ARTHROPLASTY MCL REPAIR;  Surgeon: Elspeth JONELLE Her, MD;  Location: [facility] OR;  Service: Orthopedics;  Laterality: Left;   VESICOVAGINAL FISTULA CLOSURE W/ TAH     VIDEO BRONCHOSCOPY WITH ENDOBRONCHIAL NAVIGATION Right 03/26/2023   Procedure: VIDEO BRONCHOSCOPY WITH ENDOBRONCHIAL NAVIGATION;  Surgeon: Shelah Lamar RAMAN, MD;  Location: [facility] ENDOSCOPY;  Service: Pulmonary;  Laterality: Right;  WITH FLUORO WITH BIOPSY   WEDGE RESECTION, LUNG, ROBOT-ASSISTED, THORACOSCOPIC Right 05/03/2023   Procedure: WEDGE RESECTION, LUNG, ROBOT-ASSISTED, THORACOSCOPIC;  Surgeon: Kerrin Elspeth BROCKS, MD;  Location: [facility] OR;  Service: Thoracic;  Laterality: Right;  RIGHT MIDDLE LOBE WEDGE RESECTION    CURRENT MEDICATIONS:   Current Outpatient Medications  Medication Sig Dispense Refill   acetaminophen  (TYLENOL ) 500 MG tablet Take 1,000 mg by mouth every 6 (six) hours as needed for mild pain (pain score 1-3).     albuterol  (VENTOLIN  HFA) 108 (90 Base) MCG/ACT inhaler Inhale 2 puffs into the lungs every 6 (six) hours as needed for shortness of breath or wheezing.     baclofen  (LIORESAL ) 20 MG tablet Take 20 mg by mouth daily.     cetirizine (ZYRTEC) 10 MG tablet Take 10 mg by mouth daily.     Cholecalciferol (VITAMIN D3) 125 MCG (5000 UT) TABS Take 5,000 Units by mouth daily.     dicyclomine  (BENTYL ) 10 MG capsule Take 10 mg by mouth daily as needed (bloating).     famotidine  (PEPCID ) 20 MG tablet Take 1 tablet (20 mg total) by mouth  at bedtime. 90 tablet 3   fluticasone  (FLONASE ) 50 MCG/ACT  nasal spray Place 2 sprays into both nostrils daily.     fluticasone -salmeterol (WIXELA INHUB) 250-50 MCG/ACT AEPB Inhale 1 puff into the lungs in the morning and at bedtime. 60 each 10   Hydroxychloroquine  Sulfate 300 MG TABS Take 300 mg by mouth daily.     levothyroxine  (SYNTHROID , LEVOTHROID) 88 MCG tablet Take 88 mcg by mouth daily before breakfast.     LORazepam  (ATIVAN ) 1 MG tablet Take 1 mg by mouth at bedtime.  0   Melatonin 5 MG TABS Take 5 mg by mouth at bedtime.     methylPREDNISolone  (MEDROL  DOSEPAK) 4 MG TBPK tablet Take as directed 21 tablet 0   metoprolol  tartrate (LOPRESSOR ) 25 MG tablet Take 1 tablet (25 mg total) by mouth daily. 90 tablet 2   ondansetron  (ZOFRAN ) 4 MG tablet Take 1 tablet (4 mg total) by mouth every 8 (eight) hours as needed for nausea or vomiting. 20 tablet 0   pantoprazole  (PROTONIX ) 20 MG tablet Take 20 mg by mouth daily.     traMADol  (ULTRAM ) 50 MG tablet Take 1 tablet (50 mg total) by mouth every 12 (twelve) hours as needed (mild pain). Take with food 20 tablet 0   vitamin B-12 (CYANOCOBALAMIN) 500 MCG tablet Take 500 mcg by mouth daily.     No current facility-administered medications for this visit.    ALLERGIES:   Allergies  Allergen Reactions   Iodinated Contrast Media Hives and Rash   Neurontin  [Gabapentin ] Other (See Comments)    Severe falls,dizziness, forgetful    Ciprofloxacin Diarrhea   Codeine  Nausea Only   Egg-Derived Products     GI upset per patient   Misc. Sulfonamide Containing Compounds Other (See Comments)   Omnicef [Cefdinir] Other (See Comments)    Unknown reaction.   Other Nausea And Vomiting    General anesthesia    Oxycodone  Nausea And Vomiting   Tramadol      REACTION: GI discomfort with high doses   Cyclobenzaprine Palpitations   Sulfa Antibiotics Rash    FAMILY HISTORY:   Family History  Problem Relation Age of Onset   Asthma Mother    Colon cancer  Mother    Arthritis Mother    Emphysema Father        smoked   Heart disease Father    Arthritis Father    Breast cancer Sister        BRAIN TUMOR   Asthma Brother    Arthritis Brother    Asthma Brother    Heart disease Paternal Aunt    Breast cancer Maternal Grandmother    Heart disease Maternal Grandmother    Heart disease Paternal Grandmother    SOCIAL HISTORY:  The patient was born and raised in [place] .  She currently lives in town with her husband of 23 years.  She has 2 adopted children.  She previously worked as a Conservation officer, historic buildings, as well as a Tree surgeon.  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  There is no history of smoking or alcohol abuse.  REVIEW OF SYSTEMS:  Review of Systems  Constitutional:  Positive for fatigue. Negative for fever.  HENT:   Negative for hearing loss and sore throat.   Eyes:  Negative for eye problems.  Respiratory:  Positive for cough. Negative for chest tightness and hemoptysis.   Cardiovascular:  Negative for chest pain and palpitations.  Gastrointestinal:  Positive for diarrhea. Negative for abdominal distention, abdominal pain, blood in stool, constipation, nausea and vomiting.  Endocrine: Negative for hot flashes.  Genitourinary:  Positive for dysuria. Negative for difficulty urinating, frequency, hematuria and nocturia.   Musculoskeletal:  Positive for back pain. Negative for arthralgias, gait problem and myalgias.  Skin: Negative.  Negative for itching and rash.  Neurological:  Positive for headaches. Negative for dizziness, extremity weakness, gait problem, light-headedness and numbness.  Hematological: Negative.   Psychiatric/Behavioral:  Positive for depression. Negative for suicidal ideas. The patient is not nervous/anxious.    PHYSICAL EXAM:  Blood pressure (!) 133/58, pulse 70, temperature 98.2 F (36.8 C), temperature source Oral, resp. rate 16, height 5' 2 (1.575 m), weight 139 lb (63 kg), SpO2 100%. Wt  Readings from Last 3 Encounters:  08/12/23 139 lb (63 kg)  07/09/23 139 lb (63 kg)  06/04/23 139 lb (63 kg)   Body mass index is 25.42 kg/m. Performance status (ECOG): 1 - Symptomatic but completely ambulatory Physical Exam Constitutional:      Appearance: Normal appearance. She is not ill-appearing.  HENT:     Mouth/Throat:     Mouth: Mucous membranes are moist.     Pharynx: Oropharynx is clear. No oropharyngeal exudate or posterior oropharyngeal erythema.  Cardiovascular:     Rate and Rhythm: Normal rate and regular rhythm.     Heart sounds: No murmur heard.    No friction rub. No gallop.  Pulmonary:     Effort: Pulmonary effort is normal. No respiratory distress.     Breath sounds: Normal breath sounds. No wheezing, rhonchi or rales.  Abdominal:     General: Bowel sounds are normal. There is no distension.     Palpations: Abdomen is soft. There is no mass.     Tenderness: There is no abdominal tenderness.  Musculoskeletal:        General: No swelling.     Right lower leg: No edema.     Left lower leg: No edema.  Lymphadenopathy:     Cervical: No cervical adenopathy.     Upper Body:     Right upper body: No supraclavicular or axillary adenopathy.     Left upper body: No supraclavicular or axillary adenopathy.     Lower Body: No right inguinal adenopathy. No left inguinal adenopathy.  Skin:    General: Skin is warm.     Coloration: Skin is not jaundiced.     Findings: No lesion or rash.  Neurological:     General: No focal deficit present.     Mental Status: She is alert and oriented to person, place, and time. Mental status is at baseline.  Psychiatric:        Mood and Affect: Mood normal.        Behavior: Behavior normal.        Thought Content: Thought content normal.    LABS:      Latest Ref Rng & Units 08/12/2023    3:20 PM 05/05/2023    2:08 AM 05/04/2023    2:12 AM  CBC  WBC 4.0 - 10.5 K/uL 9.4  9.4  14.8   Hemoglobin 12.0 - 15.0 g/dL 85.5  89.0  88.2    Hematocrit 36.0 - 46.0 % 45.0  33.1  35.7   Platelets 150 - 400 K/uL 294  199  226       Latest Ref Rng & Units 08/12/2023    3:20 PM 05/05/2023    2:08 AM 05/04/2023    2:12 AM  CMP  Glucose 70 - 99 mg/dL 892  894  881  BUN 8 - 23 mg/dL 19  15  11    Creatinine 0.44 - 1.00 mg/dL 9.12  9.06  9.31   Sodium 135 - 145 mmol/L 142  137  135   Potassium 3.5 - 5.1 mmol/L 3.8  3.5  3.7   Chloride 98 - 111 mmol/L 107  107  106   CO2 22 - 32 mmol/L 21  22  21    Calcium 8.9 - 10.3 mg/dL 89.9  8.6  8.3   Total Protein 6.5 - 8.1 g/dL 7.6  5.0    Total Bilirubin 0.0 - 1.2 mg/dL 0.3  0.5    Alkaline Phos 38 - 126 U/L 88  38    AST 15 - 41 U/L 21  39    ALT 0 - 44 U/L 13  46     PATHOLOGY:  Her right middle lobe wedge resection pathology from 05-03-23 revealed the following: RIGHT LUNG, MIDDLE LOBE, TUMOR, WEDGE RESECTION:  Well-differentiated neuroendocrine tumor, G1 (typical carcinoid tumor)  Tumor measures 1.5 x 1.2 x 1.0 cm (pT1b)  Two tumorlets measuring 0.1 cm and 0.2 cm  Margins free (tumor 2 cm send designated for show margin and less than  0.05 mm from pleural surface)   B. LYMPH NODE, LEVEL 9, EXCISION:  One benign lymph node, negative for tumor (0/1)   C. LYMPH NODE, LEVEL 8, EXCISION:  One benign lymph node, negative for tumor (0/1)   D. LYMPH NODE, LEVEL 7, EXCISION:  One benign lymph node, negative for tumor (0/1)   E. LYMPH NODE, LEVEL 7 #2, EXCISION:  One benign lymph node, negative for tumor (0/1)   F. LYMPH NODE, LEVEL 10, EXCISION:  One benign lymph node, negative for tumor (0/1)   G. LYMPH NODE, LEVEL 12, EXCISION:  One benign lymph node, negative for tumor (0/1)   ONCOLOGY TABLE:   LUNG: Resection   Synchronous Tumors: Not applicable  Total Number of Primary Tumors: 1  Procedure: Wedge resection  Specimen Laterality: Right  Tumor Focality: Unifocal  Tumor Site: Middle lobe  Tumor Size: 1.5 x 1.2 x 1.0 cm (with tumorlets measuring0.2 cm and 0.1  cm)   Histologic Type: Well-differentiated neuroendocrine tumor (carcinoid  tumor)  Visceral Pleura Invasion: Not identified (tumor less than 0.05 mm from  pleural surface)  Direct Invasion of Adjacent Structures: No adjacent structures present  Lymphovascular Invasion: Not identified  Margins: All margins negative for invasive carcinoma       Closest Margin(s) to Invasive Carcinoma: Tumor less than 0.05 mm  from pleural surface; tumor 2 cm from designated fissure margin  Treatment Effect: No known presurgical therapy  Regional Lymph Nodes:       Number of Lymph Nodes Involved: 0       Number of Lymph Nodes Examined: 6                       Nodal Sites Examined: Stations 7, 8, 9, 10 and 12  Distant Metastasis: Not applicable    ASSESSMENT & PLAN:  A 78 y.o. 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  In clinic today, I went over all of her surgical pathology with her, for which she understands there is a low virulent nature to this disease.  More than likely, her wedge resection likely cured her of this malignancy.  Nevertheless, I will do radiographic lung cancer  surveillance over these next handful of years to ensure there remains no evidence of disease recurrence.  Due to the well-differentiated nature of her tumor and the fact that her surgery was in May 2025, I will repeat a chest CT in November 2025 for a 52-month postsurgical follow-up.  I will see her back a day later to go over her CT scan images and their implications.  With respect to her postsurgical posterior chest wall pain, which appears to be neuropathic in nature, I will start her on Lyrica  25 mg twice daily.  She can also continue taking her Tylenol  as needed to assist with her current pain management.  The patient understands all the plans discussed today and is in agreement with them.  I do appreciate Thurmond Cathlyn LABOR., MD for his new consult.   Elfreida Heggs LABOR Kerns, MD

## 2023-08-12 ENCOUNTER — Encounter: Payer: Self-pay | Admitting: Oncology

## 2023-08-12 ENCOUNTER — Telehealth: Payer: Self-pay | Admitting: Oncology

## 2023-08-12 ENCOUNTER — Inpatient Hospital Stay: Attending: Oncology | Admitting: Oncology

## 2023-08-12 ENCOUNTER — Inpatient Hospital Stay

## 2023-08-12 ENCOUNTER — Other Ambulatory Visit: Payer: Self-pay | Admitting: Oncology

## 2023-08-12 VITALS — BP 133/58 | HR 70 | Temp 98.2°F | Resp 16 | Ht 62.0 in | Wt 139.0 lb

## 2023-08-12 DIAGNOSIS — C7A090 Malignant carcinoid tumor of the bronchus and lung: Secondary | ICD-10-CM

## 2023-08-12 DIAGNOSIS — Z79899 Other long term (current) drug therapy: Secondary | ICD-10-CM | POA: Insufficient documentation

## 2023-08-12 DIAGNOSIS — G8918 Other acute postprocedural pain: Secondary | ICD-10-CM | POA: Insufficient documentation

## 2023-08-12 DIAGNOSIS — C342 Malignant neoplasm of middle lobe, bronchus or lung: Secondary | ICD-10-CM

## 2023-08-12 DIAGNOSIS — Z800 Family history of malignant neoplasm of digestive organs: Secondary | ICD-10-CM | POA: Diagnosis not present

## 2023-08-12 DIAGNOSIS — Z803 Family history of malignant neoplasm of breast: Secondary | ICD-10-CM | POA: Diagnosis not present

## 2023-08-12 LAB — CBC WITH DIFFERENTIAL (CANCER CENTER ONLY)
Abs Immature Granulocytes: 0.02 K/uL (ref 0.00–0.07)
Basophils Absolute: 0 K/uL (ref 0.0–0.1)
Basophils Relative: 0 %
Eosinophils Absolute: 0.1 K/uL (ref 0.0–0.5)
Eosinophils Relative: 1 %
HCT: 45 % (ref 36.0–46.0)
Hemoglobin: 14.4 g/dL (ref 12.0–15.0)
Immature Granulocytes: 0 %
Lymphocytes Relative: 14 %
Lymphs Abs: 1.3 K/uL (ref 0.7–4.0)
MCH: 29.6 pg (ref 26.0–34.0)
MCHC: 32 g/dL (ref 30.0–36.0)
MCV: 92.6 fL (ref 80.0–100.0)
Monocytes Absolute: 0.8 K/uL (ref 0.1–1.0)
Monocytes Relative: 8 %
Neutro Abs: 7.2 K/uL (ref 1.7–7.7)
Neutrophils Relative %: 77 %
Platelet Count: 294 K/uL (ref 150–400)
RBC: 4.86 MIL/uL (ref 3.87–5.11)
RDW: 14.1 % (ref 11.5–15.5)
WBC Count: 9.4 K/uL (ref 4.0–10.5)
nRBC: 0 % (ref 0.0–0.2)

## 2023-08-12 LAB — CMP (CANCER CENTER ONLY)
ALT: 13 U/L (ref 0–44)
AST: 21 U/L (ref 15–41)
Albumin: 4.6 g/dL (ref 3.5–5.0)
Alkaline Phosphatase: 88 U/L (ref 38–126)
Anion gap: 14 (ref 5–15)
BUN: 19 mg/dL (ref 8–23)
CO2: 21 mmol/L — ABNORMAL LOW (ref 22–32)
Calcium: 10 mg/dL (ref 8.9–10.3)
Chloride: 107 mmol/L (ref 98–111)
Creatinine: 0.87 mg/dL (ref 0.44–1.00)
GFR, Estimated: >60 mL/min (ref >60)
Glucose, Bld: 107 mg/dL — ABNORMAL HIGH (ref 70–99)
Potassium: 3.8 mmol/L (ref 3.5–5.1)
Sodium: 142 mmol/L (ref 135–145)
Total Bilirubin: 0.3 mg/dL (ref 0.0–1.2)
Total Protein: 7.6 g/dL (ref 6.5–8.1)

## 2023-08-12 NOTE — Telephone Encounter (Signed)
 Patient has been scheduled for follow-up visit per 08/12/23 LOS.  Pt noted appt details on personal planner/calendar.

## 2023-08-13 ENCOUNTER — Other Ambulatory Visit: Payer: Self-pay | Admitting: Hematology and Oncology

## 2023-08-13 ENCOUNTER — Telehealth: Payer: Self-pay

## 2023-08-13 ENCOUNTER — Other Ambulatory Visit: Payer: Self-pay | Admitting: Oncology

## 2023-08-13 DIAGNOSIS — C342 Malignant neoplasm of middle lobe, bronchus or lung: Secondary | ICD-10-CM | POA: Insufficient documentation

## 2023-08-13 HISTORY — DX: Malignant neoplasm of middle lobe, bronchus or lung: C34.2

## 2023-08-13 MED ORDER — PREDNISONE 50 MG PO TABS: ORAL_TABLET | ORAL | 0 refills | Status: DC

## 2023-08-13 MED ORDER — PREGABALIN 25 MG PO CAPS: 25.0000 mg | ORAL_CAPSULE | Freq: Two times a day (BID) | ORAL | 3 refills | Status: DC

## 2023-08-13 NOTE — Telephone Encounter (Signed)
 Pt called to ask if Dr Ezzard sent in the prescription they discussed yesterday for neuropathy?

## 2023-08-26 DIAGNOSIS — F03A0 Unspecified dementia, mild, without behavioral disturbance, psychotic disturbance, mood disturbance, and anxiety: Secondary | ICD-10-CM

## 2023-08-26 HISTORY — DX: Unspecified dementia, mild, without behavioral disturbance, psychotic disturbance, mood disturbance, and anxiety: F03.A0

## 2023-08-29 ENCOUNTER — Other Ambulatory Visit: Payer: Self-pay

## 2023-08-29 NOTE — Progress Notes (Signed)
 This information is for discussion at tumor board and not part of the official treatment plan.

## 2023-09-10 ENCOUNTER — Ambulatory Visit: Admitting: Thoracic Surgery (Cardiothoracic Vascular Surgery)

## 2023-09-30 ENCOUNTER — Other Ambulatory Visit: Payer: Self-pay | Admitting: Cardiology

## 2023-10-14 DIAGNOSIS — I6930 Unspecified sequelae of cerebral infarction: Secondary | ICD-10-CM

## 2023-10-14 HISTORY — DX: Unspecified sequelae of cerebral infarction: I69.30

## 2023-10-15 ENCOUNTER — Ambulatory Visit: Attending: Thoracic Surgery (Cardiothoracic Vascular Surgery)

## 2023-10-15 VITALS — BP 124/72 | HR 75 | Resp 18 | Ht 62.0 in | Wt 145.0 lb

## 2023-10-15 DIAGNOSIS — M1990 Unspecified osteoarthritis, unspecified site: Secondary | ICD-10-CM | POA: Diagnosis not present

## 2023-10-15 DIAGNOSIS — C7A090 Malignant carcinoid tumor of the bronchus and lung: Secondary | ICD-10-CM | POA: Insufficient documentation

## 2023-10-15 DIAGNOSIS — R053 Chronic cough: Secondary | ICD-10-CM | POA: Diagnosis not present

## 2023-10-15 DIAGNOSIS — Z483 Aftercare following surgery for neoplasm: Secondary | ICD-10-CM | POA: Diagnosis not present

## 2023-10-15 DIAGNOSIS — F0390 Unspecified dementia without behavioral disturbance: Secondary | ICD-10-CM | POA: Insufficient documentation

## 2023-10-15 DIAGNOSIS — G4733 Obstructive sleep apnea (adult) (pediatric): Secondary | ICD-10-CM | POA: Diagnosis not present

## 2023-10-15 DIAGNOSIS — I2510 Atherosclerotic heart disease of native coronary artery without angina pectoris: Secondary | ICD-10-CM | POA: Diagnosis not present

## 2023-10-15 DIAGNOSIS — Z902 Acquired absence of lung [part of]: Secondary | ICD-10-CM | POA: Insufficient documentation

## 2023-10-15 DIAGNOSIS — I10 Essential (primary) hypertension: Secondary | ICD-10-CM | POA: Diagnosis not present

## 2023-10-15 DIAGNOSIS — I471 Supraventricular tachycardia, unspecified: Secondary | ICD-10-CM | POA: Insufficient documentation

## 2023-10-15 DIAGNOSIS — E785 Hyperlipidemia, unspecified: Secondary | ICD-10-CM | POA: Insufficient documentation

## 2023-10-15 DIAGNOSIS — K219 Gastro-esophageal reflux disease without esophagitis: Secondary | ICD-10-CM | POA: Diagnosis not present

## 2023-10-15 DIAGNOSIS — E039 Hypothyroidism, unspecified: Secondary | ICD-10-CM | POA: Diagnosis not present

## 2023-10-15 NOTE — Progress Notes (Signed)
 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 Kerrin.  She presents to the clinic with her husband and reports that she has been doing well.  She has had some slight swelling/bulging around her incision site.  She reports that it is not painful.  She attempted to put ice on it to help.  She also had had some numbness along her incision sites and tried Lyrica .  She was unable to continue this medication due to brain fog/forgetfulness. She has been using tylenol  as needed for the pain as needed.   She reports that her chronic cough has improved since surgery.  She denies chest pain, shortness of breath and lower leg edema.   Allergies as of 10/15/2023       Reactions   Iodinated Contrast Media Hives, Rash   Neurontin  [gabapentin ] Other (See Comments)   Severe falls,dizziness, forgetful    Ciprofloxacin Diarrhea   Codeine  Nausea Only   Egg Protein-containing Drug Products    GI upset per patient   Misc. Sulfonamide Containing Compounds Other (See Comments)   Omnicef [cefdinir] Other (See Comments)   Unknown reaction.   Other Nausea And Vomiting   General anesthesia    Oxycodone  Nausea And Vomiting   Tramadol     REACTION: GI discomfort with high doses   Cyclobenzaprine Palpitations   Sulfa Antibiotics Rash        Medication List        Accurate as of October 15, 2023  7:12 PM. If you have any questions, ask your nurse or doctor.          STOP taking these medications    baclofen  20 MG tablet Commonly known as: LIORESAL  Stopped by: Manuelita CHRISTELLA Rough    Hydroxychloroquine  Sulfate 300 MG Tabs Stopped by: Manuelita CHRISTELLA Rough   methylPREDNISolone  4 MG Tbpk tablet Commonly known as: MEDROL  DOSEPAK Stopped by: Manuelita CHRISTELLA Rough   predniSONE  50 MG tablet Commonly known as: DELTASONE  Stopped by: Manuelita CHRISTELLA Rough   pregabalin  25 MG capsule Commonly known as: Lyrica  Stopped by: Manuelita CHRISTELLA Rough       TAKE these medications    acetaminophen  500 MG tablet Commonly known as: TYLENOL  Take 1,000 mg by mouth every 6 (six) hours as needed for mild pain (pain score 1-3).   aspirin EC 81 MG tablet Take 81 mg by mouth daily. Swallow whole.   cetirizine 10 MG tablet Commonly known as: ZYRTEC Take 10 mg by mouth daily.   dicyclomine  10 MG capsule Commonly known as: BENTYL  Take 10 mg by mouth daily as needed (bloating).   famotidine  20 MG tablet Commonly known as: PEPCID  Take 1 tablet (20 mg total) by mouth at bedtime.   fluticasone  50 MCG/ACT nasal spray Commonly known as: FLONASE  Place 2 sprays into both nostrils daily.   fluticasone -salmeterol 250-50 MCG/ACT Aepb Commonly known as: Wixela Inhub Inhale 1 puff into the lungs in the morning and at bedtime.   levothyroxine  88 MCG tablet Commonly known as: SYNTHROID  Take 88 mcg by mouth daily before  breakfast.   LORazepam  1 MG tablet Commonly known as: ATIVAN  Take 1 mg by mouth at bedtime.   melatonin 5 MG Tabs Take 5 mg by mouth at bedtime.   metoprolol  tartrate 25 MG tablet Commonly known as: LOPRESSOR  Take 1 tablet (25 mg total) by mouth daily. Please call [phone] to schedule an appointment for future refills. Thank you.   ondansetron  4 MG tablet Commonly known as: Zofran  Take 1 tablet (4 mg total) by mouth every 8 (eight) hours as needed for nausea or vomiting.   pantoprazole  20 MG tablet Commonly known as: PROTONIX  Take 20 mg by mouth daily.   rosuvastatin 5 MG tablet Commonly known as: CRESTOR Take 5 mg by mouth daily.   Ventolin  HFA 108 (90 Base) MCG/ACT  inhaler Generic drug: albuterol  Inhale 2 puffs into the lungs every 6 (six) hours as needed for shortness of breath or wheezing.   vitamin B-12 500 MCG tablet Commonly known as: CYANOCOBALAMIN Take 500 mcg by mouth daily.   Vitamin D3 125 MCG (5000 UT) Tabs Take 5,000 Units by mouth daily.         ROS  Review of Systems  Constitutional:  Negative for malaise/fatigue.  Respiratory:  Positive for cough. Negative for shortness of breath.   Cardiovascular:  Negative for chest pain and leg swelling.     BP 124/72 (BP Location: Left Arm)   Pulse 75   Resp 18   Ht 5' 2 (1.575 m)   Wt 145 lb (65.8 kg)   SpO2 97%   BMI 26.52 kg/m    Physical Exam Constitutional:      Appearance: Normal appearance.  HENT:     Head: Normocephalic and atraumatic.  Cardiovascular:     Rate and Rhythm: Normal rate and regular rhythm.     Heart sounds: Normal heart sounds, S1 normal and S2 normal.  Pulmonary:     Effort: Pulmonary effort is normal.     Breath sounds: Normal breath sounds.  Abdominal:   Musculoskeletal:     Cervical back: Normal range of motion.  Skin:    General: Skin is warm and dry.      Neurological:     General: No focal deficit present.     Mental Status: She is alert.      Imaging:  N/A  Assessment/Plan:  S/P partial lobectomy of lung -She has been seen by oncology by Dr. Ezzard and has follow up CT scan scheduled for 11/2023. 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 This usually resoles with time.  Discussed that she can attempted to strengthen abdominal wall with exercise.   -Follow up with [facility] as needed  Manuelita CHRISTELLA Rough, PA-C 7:12 PM 10/15/23

## 2023-10-15 NOTE — Patient Instructions (Signed)
-  Continue follow up with Oncology as scheduled -Follow up with [facility] as needed

## 2023-11-02 ENCOUNTER — Other Ambulatory Visit: Payer: Self-pay | Admitting: Pulmonary Disease

## 2023-11-06 ENCOUNTER — Other Ambulatory Visit: Payer: Self-pay | Admitting: Hematology and Oncology

## 2023-11-06 MED ORDER — PREDNISONE 50 MG PO TABS: ORAL_TABLET | ORAL | 0 refills | Status: DC

## 2023-11-06 MED ORDER — DIPHENHYDRAMINE HCL 50 MG PO CAPS: ORAL_CAPSULE | ORAL | 0 refills | Status: DC

## 2023-11-07 ENCOUNTER — Other Ambulatory Visit: Payer: Self-pay | Admitting: Oncology

## 2023-11-07 ENCOUNTER — Inpatient Hospital Stay: Attending: Oncology

## 2023-11-07 ENCOUNTER — Ambulatory Visit (HOSPITAL_BASED_OUTPATIENT_CLINIC_OR_DEPARTMENT_OTHER)
Admission: RE | Admit: 2023-11-07 | Discharge: 2023-11-07 | Disposition: A | Source: Ambulatory Visit | Attending: Oncology | Admitting: Oncology

## 2023-11-07 DIAGNOSIS — Z85110 Personal history of malignant carcinoid tumor of bronchus and lung: Secondary | ICD-10-CM | POA: Insufficient documentation

## 2023-11-07 DIAGNOSIS — C7A090 Malignant carcinoid tumor of the bronchus and lung: Secondary | ICD-10-CM

## 2023-11-07 DIAGNOSIS — J984 Other disorders of lung: Secondary | ICD-10-CM | POA: Insufficient documentation

## 2023-11-07 DIAGNOSIS — Z902 Acquired absence of lung [part of]: Secondary | ICD-10-CM | POA: Insufficient documentation

## 2023-11-07 LAB — CMP (CANCER CENTER ONLY)
ALT: 20 U/L (ref 0–44)
AST: 20 U/L (ref 15–41)
Albumin: 4.3 g/dL (ref 3.5–5.0)
Alkaline Phosphatase: 58 U/L (ref 38–126)
Anion gap: 10 (ref 5–15)
BUN: 15 mg/dL (ref 8–23)
CO2: 26 mmol/L (ref 22–32)
Calcium: 9.8 mg/dL (ref 8.9–10.3)
Chloride: 104 mmol/L (ref 98–111)
Creatinine: 0.82 mg/dL (ref 0.44–1.00)
GFR, Estimated: >60 mL/min (ref >60)
Glucose, Bld: 98 mg/dL (ref 70–99)
Potassium: 3.9 mmol/L (ref 3.5–5.1)
Sodium: 140 mmol/L (ref 135–145)
Total Bilirubin: 0.3 mg/dL (ref 0.0–1.2)
Total Protein: 6.8 g/dL (ref 6.5–8.1)

## 2023-11-07 LAB — CBC WITH DIFFERENTIAL (CANCER CENTER ONLY)
Abs Immature Granulocytes: 0.1 K/uL — ABNORMAL HIGH (ref 0.00–0.07)
Basophils Absolute: 0 K/uL (ref 0.0–0.1)
Basophils Relative: 0 %
Eosinophils Absolute: 0.2 K/uL (ref 0.0–0.5)
Eosinophils Relative: 2 %
HCT: 41.5 % (ref 36.0–46.0)
Hemoglobin: 13.7 g/dL (ref 12.0–15.0)
Immature Granulocytes: 1 %
Lymphocytes Relative: 15 %
Lymphs Abs: 1.4 K/uL (ref 0.7–4.0)
MCH: 31.3 pg (ref 26.0–34.0)
MCHC: 33 g/dL (ref 30.0–36.0)
MCV: 94.7 fL (ref 80.0–100.0)
Monocytes Absolute: 0.8 K/uL (ref 0.1–1.0)
Monocytes Relative: 9 %
Neutro Abs: 7 K/uL (ref 1.7–7.7)
Neutrophils Relative %: 73 %
Platelet Count: 266 K/uL (ref 150–400)
RBC: 4.38 MIL/uL (ref 3.87–5.11)
RDW: 13.9 % (ref 11.5–15.5)
WBC Count: 9.5 K/uL (ref 4.0–10.5)
nRBC: 0 % (ref 0.0–0.2)

## 2023-11-07 NOTE — Progress Notes (Addendum)
 [facility] at [facility] [address] [place],  [place]  [address] [phone]  Clinic Day: 11/08/2023  Referring physician: Thurmond Cathlyn LABOR., MD   HISTORY OF PRESENT ILLNESS:  The patient is a 78 y.o. 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  She comes in today to go over her most recent chest CT for her lung cancer surveillance.  Since her last visit, the patient has been doing fairly well.  She denies having shortness of breath, hemoptysis, or other respiratory symptoms which concerned her for disease recurrence.  However, she still has discomfort in her right posterior rib cage from where her surgery was performed.  PHYSICAL EXAM:  Blood pressure 136/65, pulse 61, temperature 97.9 F (36.6 C), temperature source Oral, resp. rate 14, height 5' 2 (1.575 m), weight 143 lb (64.9 kg), SpO2 97%. Wt Readings from Last 3 Encounters:  11/08/23 143 lb (64.9 kg)  10/15/23 145 lb (65.8 kg)  08/12/23 139 lb (63 kg)   Body mass index is 26.16 kg/m. Performance status (ECOG): 1 - Symptomatic but completely ambulatory Physical Exam Constitutional:      Appearance: Normal appearance. She is not ill-appearing.  HENT:     Mouth/Throat:     Mouth: Mucous membranes are moist.     Pharynx: Oropharynx is clear. No oropharyngeal exudate or posterior oropharyngeal erythema.  Cardiovascular:     Rate and Rhythm: Normal rate and regular rhythm.     Heart sounds: No murmur heard.    No friction rub. No gallop.  Pulmonary:     Effort: Pulmonary effort is normal. No respiratory distress.     Breath sounds: Normal breath sounds. No wheezing, rhonchi or rales.  Abdominal:     General: Bowel sounds are normal. There is no distension.     Palpations: Abdomen is soft. There is no mass.     Tenderness: There is no abdominal tenderness.  Musculoskeletal:        General: No swelling.      Right lower leg: No edema.     Left lower leg: No edema.  Lymphadenopathy:     Cervical: No cervical adenopathy.     Upper Body:     Right upper body: No supraclavicular or axillary adenopathy.     Left upper body: No supraclavicular or axillary adenopathy.     Lower Body: No right inguinal adenopathy. No left inguinal adenopathy.  Skin:    General: Skin is warm.     Coloration: Skin is not jaundiced.     Findings: No lesion or rash.  Neurological:     General: No focal deficit present.     Mental Status: She is alert and oriented to person, place, and time. Mental status is at baseline.  Psychiatric:        Mood and Affect: Mood normal.        Behavior: Behavior normal.        Thought Content: Thought content normal.   SCANS:  Her chest CT done on 11-07-23 revealed the following: FINDINGS: Cardiovascular: Limited without intravenous contrast. Mild aortic atherosclerosis. No aneurysm. Mild coronary vascular calcification. Upper normal cardiac size. No pericardial effusion   Mediastinum/Nodes: Patent trachea. No thyroid mass. No suspicious lymph nodes. Esophagus within normal limits.   Lungs/Pleura: Status post wedge resection of right middle lobe pulmonary nodule with sutures and mild scarring in the region. No recurrent nodule is seen. Multiple bilateral  solid pulmonary nodules are again noted, for example 5 mm lingular nodule on series 302, image 56, previously 5 mm. Medial left lung base pulmonary nodule measures about 11 x 9 mm on series 302, image 70, previously 8 mm. New right posterior ground-glass subpleural nodule measuring 9 x 5 mm on series 302, image 71. Other 2-3 mm small solid pulmonary nodules grossly stable.   Upper Abdomen: Postsurgical changes at the GE junction corresponding to history of Nissen fundoplication with presence of small hiatal hernia. No acute finding   Musculoskeletal: No acute osseous abnormality. Multilevel degenerative changes.    IMPRESSION: 1. Status post wedge resection of right middle lobe pulmonary nodule with sutures and mild scarring in the region. No recurrent nodule is seen. 2. Multiple bilateral solid pulmonary nodules are again noted, the largest of which is in the medial left lung base measuring 11 x 9 mm, previously 8 mm, but present dating back to 2017. New right posterior lower lobe ground-glass subpleural nodule measuring 9 x 5 mm. Attention on follow-up chest CT. Remaining small solid pulmonary nodules are otherwise stable. 3. Aortic atherosclerosis.   Aortic Atherosclerosis (ICD10-I70.0).  LABS:      Latest Ref Rng & Units 11/07/2023    2:33 PM 08/12/2023    3:20 PM 05/05/2023    2:08 AM  CBC  WBC 4.0 - 10.5 K/uL 9.5  9.4  9.4   Hemoglobin 12.0 - 15.0 g/dL 86.2  85.5  89.0   Hematocrit 36.0 - 46.0 % 41.5  45.0  33.1   Platelets 150 - 400 K/uL 266  294  199       Latest Ref Rng & Units 11/07/2023    2:33 PM 08/12/2023    3:20 PM 05/05/2023    2:08 AM  CMP  Glucose 70 - 99 mg/dL 98  892  894   BUN 8 - 23 mg/dL 15  19  15    Creatinine 0.44 - 1.00 mg/dL 9.17  9.12  9.06   Sodium 135 - 145 mmol/L 140  142  137   Potassium 3.5 - 5.1 mmol/L 3.9  3.8  3.5   Chloride 98 - 111 mmol/L 104  107  107   CO2 22 - 32 mmol/L 26  21  22    Calcium 8.9 - 10.3 mg/dL 9.8  89.9  8.6   Total Protein 6.5 - 8.1 g/dL 6.8  7.6  5.0   Total Bilirubin 0.0 - 1.2 mg/dL 0.3  0.3  0.5   Alkaline Phos 38 - 126 U/L 58  88  38   AST 15 - 41 U/L 20  21  39   ALT 0 - 44 U/L 20  13  46    PATHOLOGY:  Her right middle lobe wedge resection pathology from 05-03-23 revealed the following: RIGHT LUNG, MIDDLE LOBE, TUMOR, WEDGE RESECTION:  Well-differentiated neuroendocrine tumor, G1 (typical carcinoid tumor)  Tumor measures 1.5 x 1.2 x 1.0 cm (pT1b)  Two tumorlets measuring 0.1 cm and 0.2 cm  Margins free (tumor 2 cm send designated for show margin and less than  0.05 mm from pleural surface)   B. LYMPH NODE, LEVEL 9,  EXCISION:  One benign lymph node, negative for tumor (0/1)   C. LYMPH NODE, LEVEL 8, EXCISION:  One benign lymph node, negative for tumor (0/1)   D. LYMPH NODE, LEVEL 7, EXCISION:  One benign lymph node, negative for tumor (0/1)   E. LYMPH NODE, LEVEL 7 #2, EXCISION:  One benign lymph  node, negative for tumor (0/1)   F. LYMPH NODE, LEVEL 10, EXCISION:  One benign lymph node, negative for tumor (0/1)   G. LYMPH NODE, LEVEL 12, EXCISION:  One benign lymph node, negative for tumor (0/1)   ONCOLOGY TABLE:   LUNG: Resection   Synchronous Tumors: Not applicable  Total Number of Primary Tumors: 1  Procedure: Wedge resection  Specimen Laterality: Right  Tumor Focality: Unifocal  Tumor Site: Middle lobe  Tumor Size: 1.5 x 1.2 x 1.0 cm (with tumorlets measuring0.2 cm and 0.1  cm)  Histologic Type: Well-differentiated neuroendocrine tumor (carcinoid  tumor)  Visceral Pleura Invasion: Not identified (tumor less than 0.05 mm from  pleural surface)  Direct Invasion of Adjacent Structures: No adjacent structures present  Lymphovascular Invasion: Not identified  Margins: All margins negative for invasive carcinoma       Closest Margin(s) to Invasive Carcinoma: Tumor less than 0.05 mm  from pleural surface; tumor 2 cm from designated fissure margin  Treatment Effect: No known presurgical therapy  Regional Lymph Nodes:       Number of Lymph Nodes Involved: 0       Number of Lymph Nodes Examined: 6                       Nodal Sites Examined: Stations 7, 8, 9, 10 and 12  Distant Metastasis: Not applicable    ASSESSMENT & PLAN:  A 78 y.o. 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  In clinic today, I went over her most recent CT scan images with her, for which she could see vague nodularity in both lungs, most of which has been present previously.  None of these lesions looks particularly large or  ominous in appearance.  The patient and I mutually agreed to have a repeat chest CT done in 6 months to ensure none of these lesions has significantly enlarged in size to where a repeat biopsy would be necessary.  I will see her back in May 2026 to go over her chest CT images and their implications.  The patient understands all the plans discussed today and is in agreement with them.  Alexee Delsanto DELENA Kerns, MD

## 2023-11-08 ENCOUNTER — Telehealth: Payer: Self-pay | Admitting: Oncology

## 2023-11-08 ENCOUNTER — Inpatient Hospital Stay (HOSPITAL_BASED_OUTPATIENT_CLINIC_OR_DEPARTMENT_OTHER): Admitting: Oncology

## 2023-11-08 ENCOUNTER — Other Ambulatory Visit: Payer: Self-pay | Admitting: Oncology

## 2023-11-08 VITALS — BP 136/65 | HR 61 | Temp 97.9°F | Resp 14 | Ht 62.0 in | Wt 143.0 lb

## 2023-11-08 DIAGNOSIS — C7A090 Malignant carcinoid tumor of the bronchus and lung: Secondary | ICD-10-CM

## 2023-11-08 DIAGNOSIS — C342 Malignant neoplasm of middle lobe, bronchus or lung: Secondary | ICD-10-CM

## 2023-11-08 DIAGNOSIS — J984 Other disorders of lung: Secondary | ICD-10-CM | POA: Diagnosis not present

## 2023-11-08 NOTE — Telephone Encounter (Signed)
 Patient has been scheduled for follow-up visit per 11/08/23 LOS.  Pt aware of scheduled appt details.

## 2023-12-08 NOTE — Telephone Encounter (Signed)
 error

## 2023-12-27 ENCOUNTER — Telehealth: Payer: Self-pay | Admitting: *Deleted

## 2023-12-27 ENCOUNTER — Encounter: Payer: Self-pay | Admitting: *Deleted

## 2023-12-27 MED ORDER — METOPROLOL TARTRATE 25 MG PO TABS: 25.0000 mg | ORAL_TABLET | Freq: Every day | ORAL | 0 refills | Status: AC

## 2023-12-27 NOTE — Telephone Encounter (Signed)
 Rx refill sent to pharmacy.

## 2023-12-30 ENCOUNTER — Other Ambulatory Visit: Payer: Self-pay

## 2023-12-30 DIAGNOSIS — I4891 Unspecified atrial fibrillation: Secondary | ICD-10-CM | POA: Insufficient documentation

## 2023-12-30 DIAGNOSIS — T8859XA Other complications of anesthesia, initial encounter: Secondary | ICD-10-CM | POA: Insufficient documentation

## 2023-12-30 DIAGNOSIS — T7840XA Allergy, unspecified, initial encounter: Secondary | ICD-10-CM | POA: Insufficient documentation

## 2023-12-30 DIAGNOSIS — Z8701 Personal history of pneumonia (recurrent): Secondary | ICD-10-CM | POA: Insufficient documentation

## 2023-12-30 DIAGNOSIS — C801 Malignant (primary) neoplasm, unspecified: Secondary | ICD-10-CM | POA: Insufficient documentation

## 2023-12-30 DIAGNOSIS — R519 Headache, unspecified: Secondary | ICD-10-CM | POA: Insufficient documentation

## 2023-12-30 DIAGNOSIS — D649 Anemia, unspecified: Secondary | ICD-10-CM | POA: Insufficient documentation

## 2023-12-30 DIAGNOSIS — H269 Unspecified cataract: Secondary | ICD-10-CM | POA: Insufficient documentation

## 2023-12-31 ENCOUNTER — Encounter: Payer: Self-pay | Admitting: Cardiology

## 2023-12-31 ENCOUNTER — Ambulatory Visit: Attending: Cardiology | Admitting: Cardiology

## 2023-12-31 VITALS — BP 154/70 | HR 64 | Ht 62.0 in | Wt 147.4 lb

## 2023-12-31 DIAGNOSIS — I471 Supraventricular tachycardia, unspecified: Secondary | ICD-10-CM | POA: Diagnosis not present

## 2023-12-31 DIAGNOSIS — D3A090 Benign carcinoid tumor of the bronchus and lung: Secondary | ICD-10-CM | POA: Diagnosis not present

## 2023-12-31 DIAGNOSIS — I2510 Atherosclerotic heart disease of native coronary artery without angina pectoris: Secondary | ICD-10-CM | POA: Insufficient documentation

## 2023-12-31 DIAGNOSIS — R0609 Other forms of dyspnea: Secondary | ICD-10-CM | POA: Insufficient documentation

## 2023-12-31 NOTE — Patient Instructions (Addendum)

## 2023-12-31 NOTE — Progress Notes (Signed)
 " Cardiology Office Note:    Date:  12/31/2023   ID:  [name], DOB 03/21/1945, MRN [id number]  PCP:  Theresa Cathlyn LABOR., MD  Cardiologist:  Theresa Fitch, MD    Referring MD: Theresa Cathlyn LABOR., MD   No chief complaint on file. I am doing fine  History of Present Illness:    [name] is a 78 y.o. female past medical history significant for sinus bradycardia, supraventricular tachycardia, essential hypertension, dyslipidemia, she did have calcification of the coronary arteries on CT of her chest.  She was referred to us  for follow-up.  She was diagnosed with a carcinoid tumor requiring resection done by cardiothoracic surgeon since that time she is doing fine but described to have some still pain in the right lower chest.  Cardiac wise doing well, denies have any chest pain tightness squeezing pressure burning chest, she does what she wants to do  Past Medical History:  Diagnosis Date   Abnormal chest x-ray with multiple lung nodules 08/22/2017   Abnormal weight loss    Age-related osteoporosis without current pathological fracture 03/15/2015   Last Assessment & Plan:  Formatting of this note might be different from the original. Relevant Hx: Course: Daily Update: Today's Plan:discussed in depth with her and she is going to try the boniva when she gets back from her [place] trip, and she was advised to increase her vitamin D 3 to 3000 IU daily after review of her lab and we reviewed her DEXA last year and she will be due for this in 2018  El   Allergy    Alopecia    Anemia    Anxiety    antianxiety med. usually at night but can take during the day for anxiety   Arthralgia of hip 10/26/2019   Arthritis    both knees & hands  also the back & neck   Asthma    Atrial fibrillation (HCC)    pt. reports that [facility] that she had a slight afib., no further test needed (01/07/14 cardiology note does not mention any afib, just poor anterior r wave progression)   Breast pain, right  08/29/2021   Cancer (HCC)    Cataract    Cervical radiculopathy 01/19/2017   Chronic cough    Chronic rhinitis 12/04/2012   Followed in Pulmonary clinic/ [facility]/ [facility]   - sinus ct 10/23/2012 >>> Clear paranasal sinuses. - trial of qid 1st gen H1 02/12/2013 > not effective 05/28/13        Complication of anesthesia    Coronary artery disease involving native coronary artery of native heart without angina pectoris 06/08/2016   Formatting of this note might be different from the original. No longer sees cardiolgist.  ST okay.  Imaging with calcium   Cough variant asthma 06/18/2013   - Methacholine  challenge test 06/03/13 > equivocal but clinically caused tightness and made her cough - hfa 75% p coaching 06/17/13 > Trial of dulera 100 2bid started 06/18/2013     Degenerative cervical disc 06/08/2016   Depression    Diaphragmatic hernia    Dizziness 12/11/2019   Elbow tendonitis    Endometriosis    Enlarged lymph nodes in armpit 05/09/2020   Gastric polyp    Gastroenteritis    GERD (gastroesophageal reflux disease)    Headache    Hiatal hernia 11/24/2013   High risk medication use 03/15/2015   History of colonic polyps 02/09/2019   History of hiatal hernia    pt. has been  told that the hiatal hernia has reappeared   History of melanoma 06/17/2017   History of pneumonia    Hypercholesteremia    Hyperlipidemia    Hypertension    pt. reports that she use to take antihtn med, but reports since weight loss she has been able to come off.     Hypothyroidism    Insomnia    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   Laryngopharyngeal reflux (LPR) 07/19/2014   Late effect of lacunar infarction 10/14/2023   Left lumbar pain 09/19/2020   Lung cancer, middle lobe (HCC) 08/13/2023   Lung nodules 09/16/2017   Formatting of this note might be different from the original. Following with [facility].  Stabel.Neg PET scan   Malaise and fatigue 03/15/2015   Last Assessment &  Plan:  Formatting of this note might be different from the original. Relevant Hx: Course: Daily Update: Today's Plan:she has some days better than others for her and she admits that with her allergies and asthma at times she is more tired as well as with the OA she has that creates some difficulty  Electronically signed by: Eleanor Merlynn Lady, NP 04/04/15 1058   Melanoma (HCC) 2011   heel   Mild dementia without behavioral disturbance, psychotic disturbance, mood disturbance, or anxiety (HCC) 08/26/2023   Mixed hyperlipidemia 03/15/2015   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   OSA (obstructive sleep apnea)    does not tolerate CPAP , pt. reports that its severe, but doesn't use it anymore, since weight loss.   Study done in [place], not sure where.    Osteoarthritis    Osteoporosis    PONV (postoperative nausea and vomiting)    Prediabetes 11/18/2019   Primary insomnia 03/15/2015   Primary osteoarthritis involving multiple joints 03/15/2015   Last Assessment & Plan:  Formatting of this note might be different from the original. 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   Restless leg syndrome 11/18/2017   Right kidney mass 05/31/2021   S/P knee replacement 01/29/2014   Seasonal allergic rhinitis due to pollen 03/17/2019   Sinus bradycardia 12/07/2019   Sinus bradycardia by electrocardiogram 12/07/2019   Sleep apnea    does not wear a CPAP   SVT (supraventricular tachycardia)    pt. reports that [facility] that she had a slight afib., no further test needed (01/07/14 cardiology note does not mention any afib, just poor anterior r wave progression)     Upper  airway cough syndrome 08/04/2012   Followed in Pulmonary clinic/ [facility]/ [place]  -Kozlow eval around 2012 pos dust/ roach  Neg resp to singulair  - PFT's 02/2010 nl in effort dep portion of f/v loop, lung vol and dlcos also nl  - CT chest 05/27/12 wnl x small HH - 08/04/12   Alpha One AT >  MM - informed 09/10/2012 had not received demerol  rx on 09/02/12  - sinus ct 10/23/2012 >>> Clear paranasal sinuses. - responded to neuronti   Vitamin B 12 deficiency    Vitamin D deficiency     Past Surgical History:  Procedure Laterality Date   ABDOMINAL HYSTERECTOMY     APPENDECTOMY     BRONCHIAL BIOPSY  03/26/2023   Procedure: BRONCHOSCOPY, WITH BIOPSY;  Surgeon: Shelah Theresa RAMAN, MD;  Location: [facility] ENDOSCOPY;  Service: Pulmonary;;   BRONCHIAL BRUSHINGS  03/26/2023   Procedure: BRONCHOSCOPY, WITH BRUSH BIOPSY;  Surgeon: Shelah Theresa RAMAN, MD;  Location: [facility] ENDOSCOPY;  Service: Pulmonary;;   BRONCHIAL NEEDLE ASPIRATION BIOPSY  03/26/2023   Procedure: BRONCHOSCOPY, WITH NEEDLE ASPIRATION BIOPSY;  Surgeon: Shelah Theresa RAMAN, MD;  Location: [facility] ENDOSCOPY;  Service: Pulmonary;;   CHOLECYSTECTOMY     COLONOSCOPY  05/09/2015   Colonic Polyps, moderate sigmoid diverticulosis. Bx: Tubular Adenomas. Negative   DILATION AND CURETTAGE, DIAGNOSTIC / THERAPEUTIC     ESOPHAGEAL DILATION  08/2016   EYE SURGERY     cataracts removed / w IOL   FUDUCIAL PLACEMENT  03/26/2023   Procedure: INSERTION, FIDUCIAL MARKER, GOLD;  Surgeon: Shelah Theresa RAMAN, MD;  Location: [facility] ENDOSCOPY;  Service: Pulmonary;;   GALLBLADDER SURGERY     HERNIA REPAIR     HIATAL HERNIA REPAIR  2/14   HIATAL HERNIA REPAIR  2014   revision of 2018 in [place] NERVE BLOCK Right 05/03/2023   Procedure: BLOCK, NERVE, INTERCOSTAL;  Surgeon: Kerrin Elspeth BROCKS, MD;  Location: [facility] OR;  Service: Thoracic;  Laterality: Right;   KNEE ARTHROSCOPY     both knees for torn cartilage    LYMPH NODE BIOPSY Right 05/03/2023   Procedure: LYMPH NODE  BIOPSY;  Surgeon: Kerrin Elspeth BROCKS, MD;  Location: [facility] OR;  Service: Thoracic;  Laterality: Right;   NERVE REPAIR Left 07/11/2017   Procedure: Excision of saphenous nerve left knee. Right knee intra-articular injection.;  Surgeon: Ernie Cough, MD;  Location: [facility] ORS;  Service: Orthopedics;  Laterality: Left;  60 mins   OOPHORECTOMY     TONSILLECTOMY     TOTAL KNEE ARTHROPLASTY Left 01/29/2014   Procedure: LEFT TOTAL KNEE ARTHROPLASTY MCL REPAIR;  Surgeon: Elspeth JONELLE Her, MD;  Location: [facility] OR;  Service: Orthopedics;  Laterality: Left;   VESICOVAGINAL FISTULA CLOSURE W/ TAH     VIDEO BRONCHOSCOPY WITH ENDOBRONCHIAL NAVIGATION Right 03/26/2023   Procedure: VIDEO BRONCHOSCOPY WITH ENDOBRONCHIAL NAVIGATION;  Surgeon: Shelah Theresa RAMAN, MD;  Location: [facility] ENDOSCOPY;  Service: Pulmonary;  Laterality: Right;  WITH FLUORO WITH BIOPSY   WEDGE RESECTION, LUNG, ROBOT-ASSISTED, THORACOSCOPIC Right 05/03/2023   Procedure: WEDGE RESECTION, LUNG, ROBOT-ASSISTED, THORACOSCOPIC;  Surgeon: Kerrin Elspeth BROCKS, MD;  Location: [facility] OR;  Service: Thoracic;  Laterality: Right;  RIGHT MIDDLE LOBE WEDGE RESECTION    Current Medications: Active Medications[1]   Allergies:   Iodinated contrast media, Neurontin  [gabapentin ], Ciprofloxacin, Codeine , Egg protein-containing drug products, Misc. 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   Tobacco Use   Smoking status: Never    Passive exposure: Past   Smokeless tobacco: Never  Vaping Use   Vaping status: Never Used  Substance and Sexual Activity   Alcohol use: Not Currently   Drug use: No   Sexual activity:  Not Currently  Other Topics Concern   Not on file  Social History Narrative   Lives with husbandRetired, 2 adopted children   Social Drivers of Health   Tobacco Use:  Low Risk (12/31/2023)   Patient History    Smoking Tobacco Use: Never    Smokeless Tobacco Use: Never    Passive Exposure: Past  Financial Resource Strain: Not on file  Food Insecurity: Low Risk (12/31/2023)   Received from [facility]   Epic    Within the past 12 months, you worried that your food would run out before you got money to buy more: Never true    Within the past 12 months, the food you bought just didn't last and you didn't have money to get more. : Never true  Transportation Needs: No Transportation Needs (12/31/2023)   Received from [facility]    In the past 12 months, has lack of reliable transportation kept you from medical appointments, meetings, work or from getting things needed for daily living? : No  Physical Activity: Not on file  Stress: Not on file  Social Connections: Socially Integrated (05/03/2023)   Social Connection and Isolation Panel    Frequency of Communication with Friends and Family: More than three times a week    Frequency of Social Gatherings with Friends and Family: Once a week    Attends Religious Services: More than 4 times per year    Active Member of Clubs or Organizations: Yes    Attends Banker Meetings: More than 4 times per year    Marital Status: Married  Depression (PHQ2-9): Low Risk (11/08/2023)   Depression (PHQ2-9)    PHQ-2 Score: 0  Alcohol Screen: Not on file  Housing: Low Risk (12/31/2023)   Received from [facility]   Epic    What is your living situation today?: I have a steady place to live    Think about the place you live. Do you have problems with any of the following? Choose all that apply:: None/None on this list  Utilities: Low Risk (12/31/2023)   Received from [facility]   Utilities    In the past 12 months  has the electric, gas, oil, or water company threatened to shut off services in your home? : No  Health Literacy: Not on file     Family History: The patient's family history includes Arthritis in her brother, father, and mother; Asthma in her brother, brother, and mother; Breast cancer in her maternal grandmother and sister; Colon cancer in her mother; Emphysema in her father; Heart disease in her father, maternal grandmother, paternal aunt, and paternal grandmother. ROS:   Please see the history of present illness.    All 14 point review of systems negative except as described per history of present illness  EKGs/Labs/Other Studies Reviewed:         Recent Labs: 11/07/2023: ALT 20; BUN 15; Creatinine 0.82; Hemoglobin 13.7; Platelet Count 266; Potassium 3.9; Sodium 140  Recent Lipid Panel No results found for: CHOL, TRIG, HDL, CHOLHDL, VLDL, LDLCALC, LDLDIRECT  Physical Exam:    VS:  BP (!) 154/70   Pulse 64   Ht 5' 2 (1.575 m)   Wt 147 lb 6.4 oz (66.9 kg)   SpO2 94%   BMI 26.96 kg/m     Wt Readings from Last 3 Encounters:  12/31/23 147 lb 6.4 oz (66.9 kg)  11/08/23 143 lb (64.9 kg)  10/15/23 145 lb (65.8 kg)     GEN:  Well nourished, well developed in no acute distress HEENT: Normal NECK: No JVD; No carotid bruits LYMPHATICS: No lymphadenopathy CARDIAC: RRR, no murmurs, no rubs, no gallops RESPIRATORY:  Clear  to auscultation without rales, wheezing or rhonchi  ABDOMEN: Soft, non-tender, non-distended MUSCULOSKELETAL:  No edema; No deformity  SKIN: Warm and dry LOWER EXTREMITIES: no swelling NEUROLOGIC:  Alert and oriented x 3 PSYCHIATRIC:  Normal affect   ASSESSMENT:    1. Dyspnea on exertion   2. Coronary artery disease involving native coronary artery of native heart without angina pectoris   3. SVT (supraventricular tachycardia)   4. Carcinoid tumor of lung, unspecified whether malignant (HCC)    PLAN:    In order of problems listed  above:  Dyspnea on exertion multifactorial lung resection placed overall but overall seems to be doing well. Coronary disease completely asymptomatic denies have any chest pain tightness squeezing pressure in chest. SVT no symptoms no recurrences. Carcinoid tumor s/p resection.  I will schedule her to have echocardiogram make sure there is no right ventricle involvement secondary to carcinoid   Medication Adjustments/Labs and Tests Ordered: Current medicines are reviewed at length with the patient today.  Concerns regarding medicines are outlined above.  Orders Placed This Encounter  Procedures   ECHOCARDIOGRAM COMPLETE   Medication changes: No orders of the defined types were placed in this encounter.   Signed, Theresa DOROTHA Fitch, MD, [facility] 12/31/2023 4:48 PM    [facility]     [1]  Current Meds  Medication Sig   acetaminophen  (TYLENOL ) 500 MG tablet Take 1,000 mg by mouth every 6 (six) hours as needed for mild pain (pain score 1-3).   albuterol  (VENTOLIN  HFA) 108 (90 Base) MCG/ACT inhaler Inhale 2 puffs into the lungs every 6 (six) hours as needed for shortness of breath or wheezing.   aspirin EC 81 MG tablet Take 81 mg by mouth daily. Swallow whole.   cetirizine (ZYRTEC) 10 MG tablet Take 10 mg by mouth daily.   Cholecalciferol (VITAMIN D3) 125 MCG (5000 UT) TABS Take 5,000 Units by mouth daily.   dicyclomine  (BENTYL ) 10 MG capsule Take 10 mg by mouth daily as needed (bloating).   famotidine  (PEPCID ) 20 MG tablet Take 1 tablet (20 mg total) by mouth at bedtime.   fluticasone  (FLONASE ) 50 MCG/ACT nasal spray Place 2 sprays into both nostrils daily.   levothyroxine  (SYNTHROID , LEVOTHROID) 88 MCG tablet Take 88 mcg by mouth daily before breakfast.   LORazepam  (ATIVAN ) 1 MG tablet Take 1 mg by mouth at bedtime.   Melatonin 5 MG TABS Take 5 mg by mouth at bedtime.   metoprolol  tartrate (LOPRESSOR ) 25 MG tablet Take 1 tablet (25 mg total) by mouth daily.  Keep appt on 12/31/23   ondansetron  (ZOFRAN ) 4 MG tablet Take 1 tablet (4 mg total) by mouth every 8 (eight) hours as needed for nausea or vomiting.   pantoprazole  (PROTONIX ) 20 MG tablet Take 20 mg by mouth daily.   vitamin B-12 (CYANOCOBALAMIN) 500 MCG tablet Take 500 mcg by mouth daily.   WIXELA INHUB 250-50 MCG/ACT AEPB INHALE 1 PUFF INTO THE LUNGS IN THE MORNING AND AT BEDTIME.   "

## 2024-02-03 ENCOUNTER — Ambulatory Visit

## 2024-03-09 ENCOUNTER — Ambulatory Visit

## 2024-05-06 ENCOUNTER — Inpatient Hospital Stay

## 2024-05-07 ENCOUNTER — Inpatient Hospital Stay: Admitting: Oncology
